# Patient Record
Sex: Female | Born: 1960 | Race: White | Hispanic: No
Health system: Southern US, Academic
[De-identification: ages and names within clinical notes are randomized; demographics above are authoritative.]

## PROBLEM LIST (undated history)

## (undated) ENCOUNTER — Emergency Department (HOSPITAL_COMMUNITY): Discharge status: Absent without leave

## (undated) ENCOUNTER — Telehealth

## (undated) ENCOUNTER — Ambulatory Visit: Payer: PRIVATE HEALTH INSURANCE

## (undated) ENCOUNTER — Encounter

## (undated) ENCOUNTER — Ambulatory Visit

## (undated) ENCOUNTER — Encounter
Attending: Pharmacist Clinician (PhC)/ Clinical Pharmacy Specialist | Primary: Pharmacist Clinician (PhC)/ Clinical Pharmacy Specialist

## (undated) ENCOUNTER — Telehealth
Attending: Pharmacist Clinician (PhC)/ Clinical Pharmacy Specialist | Primary: Pharmacist Clinician (PhC)/ Clinical Pharmacy Specialist

## (undated) ENCOUNTER — Other Ambulatory Visit

## (undated) ENCOUNTER — Ambulatory Visit
Payer: Medicaid (Managed Care) | Attending: Student in an Organized Health Care Education/Training Program | Primary: Student in an Organized Health Care Education/Training Program

## (undated) ENCOUNTER — Ambulatory Visit: Payer: Medicaid (Managed Care)

## (undated) ENCOUNTER — Telehealth
Attending: Student in an Organized Health Care Education/Training Program | Primary: Student in an Organized Health Care Education/Training Program

## (undated) ENCOUNTER — Ambulatory Visit: Payer: PRIVATE HEALTH INSURANCE | Attending: Hematology & Oncology | Primary: Hematology & Oncology

## (undated) ENCOUNTER — Ambulatory Visit
Attending: Student in an Organized Health Care Education/Training Program | Primary: Student in an Organized Health Care Education/Training Program

## (undated) DIAGNOSIS — F419 Anxiety disorder, unspecified: Secondary | ICD-10-CM

## (undated) DIAGNOSIS — B1710 Acute hepatitis C without hepatic coma: Secondary | ICD-10-CM

## (undated) DIAGNOSIS — M722 Plantar fascial fibromatosis: Secondary | ICD-10-CM

## (undated) DIAGNOSIS — M1990 Unspecified osteoarthritis, unspecified site: Secondary | ICD-10-CM

## (undated) DIAGNOSIS — M51369 Other intervertebral disc degeneration, lumbar region without mention of lumbar back pain or lower extremity pain: Secondary | ICD-10-CM

## (undated) DIAGNOSIS — M5430 Sciatica, unspecified side: Secondary | ICD-10-CM

## (undated) DIAGNOSIS — F32A Depression, unspecified: Secondary | ICD-10-CM

## (undated) DIAGNOSIS — J449 Chronic obstructive pulmonary disease, unspecified: Secondary | ICD-10-CM

## (undated) DIAGNOSIS — J309 Allergic rhinitis, unspecified: Secondary | ICD-10-CM

## (undated) DIAGNOSIS — G2581 Restless legs syndrome: Secondary | ICD-10-CM

## (undated) DIAGNOSIS — I10 Essential (primary) hypertension: Secondary | ICD-10-CM

## (undated) DIAGNOSIS — F329 Major depressive disorder, single episode, unspecified: Secondary | ICD-10-CM

## (undated) DIAGNOSIS — M5136 Other intervertebral disc degeneration, lumbar region: Secondary | ICD-10-CM

## (undated) DIAGNOSIS — M5126 Other intervertebral disc displacement, lumbar region: Secondary | ICD-10-CM

## (undated) HISTORY — DX: Major depressive disorder, single episode, unspecified: F32.9

## (undated) HISTORY — PX: BLADDER REPAIR: SHX76

## (undated) HISTORY — DX: Other intervertebral disc degeneration, lumbar region without mention of lumbar back pain or lower extremity pain: M51.369

## (undated) HISTORY — DX: Other intervertebral disc degeneration, lumbar region: M51.36

## (undated) HISTORY — PX: TOTAL VAGINAL HYSTERECTOMY: SHX2548

## (undated) HISTORY — DX: Acute hepatitis C without hepatic coma: B17.10

## (undated) HISTORY — DX: Other intervertebral disc displacement, lumbar region: M51.26

## (undated) HISTORY — DX: Allergic rhinitis, unspecified: J30.9

## (undated) HISTORY — PX: VAGINAL HYSTERECTOMY: SUR661

## (undated) HISTORY — DX: Depression, unspecified: F32.A

## (undated) HISTORY — PX: TUBAL LIGATION: SHX77

## (undated) HISTORY — DX: Plantar fascial fibromatosis: M72.2

## (undated) HISTORY — DX: Essential (primary) hypertension: I10

## (undated) HISTORY — PX: EYE SURGERY: SHX253

---

## 2004-05-27 ENCOUNTER — Emergency Department: Payer: Self-pay | Admitting: Emergency Medicine

## 2006-02-02 ENCOUNTER — Emergency Department: Payer: Self-pay | Admitting: Emergency Medicine

## 2006-12-20 ENCOUNTER — Ambulatory Visit: Payer: Self-pay

## 2008-08-26 ENCOUNTER — Emergency Department: Payer: Self-pay | Admitting: Emergency Medicine

## 2009-01-05 ENCOUNTER — Emergency Department (HOSPITAL_COMMUNITY): Admission: EM | Admit: 2009-01-05 | Discharge: 2009-01-06 | Payer: Self-pay | Admitting: Emergency Medicine

## 2009-10-09 ENCOUNTER — Ambulatory Visit: Payer: Self-pay | Admitting: Family Medicine

## 2010-10-03 LAB — CBC
HCT: 45.6 % (ref 36.0–46.0)
Hemoglobin: 15.7 g/dL — ABNORMAL HIGH (ref 12.0–15.0)
MCHC: 34.4 g/dL (ref 30.0–36.0)
MCV: 102.3 fL — ABNORMAL HIGH (ref 78.0–100.0)
RBC: 4.46 MIL/uL (ref 3.87–5.11)
RDW: 13.3 % (ref 11.5–15.5)

## 2010-10-03 LAB — DIFFERENTIAL
Basophils Absolute: 0.1 10*3/uL (ref 0.0–0.1)
Basophils Relative: 1 % (ref 0–1)
Eosinophils Absolute: 0 10*3/uL (ref 0.0–0.7)
Eosinophils Relative: 0 % (ref 0–5)
Monocytes Absolute: 0.3 10*3/uL (ref 0.1–1.0)
Monocytes Relative: 5 % (ref 3–12)
Neutro Abs: 4 10*3/uL (ref 1.7–7.7)

## 2010-10-03 LAB — RAPID URINE DRUG SCREEN, HOSP PERFORMED
Barbiturates: NOT DETECTED
Cocaine: NOT DETECTED
Opiates: NOT DETECTED
Tetrahydrocannabinol: NOT DETECTED

## 2010-10-03 LAB — BASIC METABOLIC PANEL
CO2: 28 mEq/L (ref 19–32)
Calcium: 9.3 mg/dL (ref 8.4–10.5)
Chloride: 101 mEq/L (ref 96–112)
GFR calc Af Amer: >60 mL/min (ref >60)
Glucose, Bld: 98 mg/dL (ref 70–99)
Sodium: 137 mEq/L (ref 135–145)

## 2011-10-11 ENCOUNTER — Ambulatory Visit: Payer: Self-pay | Admitting: Family Medicine

## 2012-04-20 ENCOUNTER — Ambulatory Visit: Payer: Self-pay | Admitting: Family Medicine

## 2013-12-20 ENCOUNTER — Ambulatory Visit: Payer: Self-pay | Admitting: Gastroenterology

## 2014-11-25 ENCOUNTER — Other Ambulatory Visit: Payer: Self-pay | Admitting: Family Medicine

## 2014-11-25 DIAGNOSIS — Z1231 Encounter for screening mammogram for malignant neoplasm of breast: Secondary | ICD-10-CM

## 2014-12-02 ENCOUNTER — Ambulatory Visit
Admission: RE | Admit: 2014-12-02 | Discharge: 2014-12-02 | Disposition: A | Discharge status: Home / Self Care | Payer: PRIVATE HEALTH INSURANCE | Source: Ambulatory Visit | Attending: Family Medicine | Admitting: Family Medicine

## 2014-12-02 DIAGNOSIS — Z1231 Encounter for screening mammogram for malignant neoplasm of breast: Secondary | ICD-10-CM | POA: Diagnosis present

## 2014-12-03 ENCOUNTER — Other Ambulatory Visit: Payer: Self-pay | Admitting: Family Medicine

## 2014-12-03 DIAGNOSIS — N6489 Other specified disorders of breast: Secondary | ICD-10-CM

## 2014-12-03 DIAGNOSIS — R928 Other abnormal and inconclusive findings on diagnostic imaging of breast: Secondary | ICD-10-CM

## 2014-12-03 NOTE — Telephone Encounter (Signed)
This encounter was created in error - please disregard.

## 2014-12-04 NOTE — Progress Notes (Signed)
Signed off to be done.

## 2014-12-15 ENCOUNTER — Ambulatory Visit
Admission: RE | Admit: 2014-12-15 | Discharge: 2014-12-15 | Disposition: A | Discharge status: Home / Self Care | Payer: PRIVATE HEALTH INSURANCE | Source: Ambulatory Visit | Attending: Family Medicine | Admitting: Family Medicine

## 2014-12-15 ENCOUNTER — Ambulatory Visit: Payer: PRIVATE HEALTH INSURANCE

## 2014-12-15 DIAGNOSIS — N6489 Other specified disorders of breast: Secondary | ICD-10-CM | POA: Insufficient documentation

## 2014-12-15 DIAGNOSIS — R928 Other abnormal and inconclusive findings on diagnostic imaging of breast: Secondary | ICD-10-CM | POA: Diagnosis not present

## 2014-12-22 ENCOUNTER — Other Ambulatory Visit: Payer: PRIVATE HEALTH INSURANCE

## 2014-12-22 ENCOUNTER — Other Ambulatory Visit: Payer: Self-pay

## 2014-12-22 DIAGNOSIS — R946 Abnormal results of thyroid function studies: Secondary | ICD-10-CM

## 2014-12-23 ENCOUNTER — Telehealth: Payer: Self-pay | Admitting: Family Medicine

## 2014-12-23 LAB — TSH: TSH: 4.79 u[IU]/mL — AB (ref 0.450–4.500)

## 2014-12-23 NOTE — Telephone Encounter (Signed)
Phone call Discuss TSH next to nl and pt no thyroid sx  Will obs  No tx

## 2014-12-23 NOTE — Telephone Encounter (Signed)
-----   Message from Wynn Maudlin, [place] sent at 12/23/2014  5:27 PM EDT ----- Line [phone]

## 2014-12-25 DIAGNOSIS — I10 Essential (primary) hypertension: Secondary | ICD-10-CM

## 2014-12-25 DIAGNOSIS — F339 Major depressive disorder, recurrent, unspecified: Secondary | ICD-10-CM

## 2014-12-25 DIAGNOSIS — J42 Unspecified chronic bronchitis: Secondary | ICD-10-CM

## 2014-12-25 DIAGNOSIS — F319 Bipolar disorder, unspecified: Secondary | ICD-10-CM

## 2014-12-25 DIAGNOSIS — F329 Major depressive disorder, single episode, unspecified: Secondary | ICD-10-CM | POA: Insufficient documentation

## 2014-12-25 DIAGNOSIS — B1710 Acute hepatitis C without hepatic coma: Secondary | ICD-10-CM | POA: Insufficient documentation

## 2014-12-25 DIAGNOSIS — J209 Acute bronchitis, unspecified: Secondary | ICD-10-CM

## 2014-12-25 DIAGNOSIS — F32A Depression, unspecified: Secondary | ICD-10-CM | POA: Insufficient documentation

## 2014-12-26 ENCOUNTER — Encounter: Payer: Self-pay | Admitting: Unknown Physician Specialty

## 2014-12-26 ENCOUNTER — Ambulatory Visit (INDEPENDENT_AMBULATORY_CARE_PROVIDER_SITE_OTHER): Payer: PRIVATE HEALTH INSURANCE | Admitting: Unknown Physician Specialty

## 2014-12-26 VITALS — BP 149/81 | HR 82 | Temp 97.8°F | Ht 63.7 in | Wt 129.6 lb

## 2014-12-26 DIAGNOSIS — R0602 Shortness of breath: Secondary | ICD-10-CM

## 2014-12-26 DIAGNOSIS — J449 Chronic obstructive pulmonary disease, unspecified: Secondary | ICD-10-CM

## 2014-12-26 DIAGNOSIS — F17200 Nicotine dependence, unspecified, uncomplicated: Secondary | ICD-10-CM

## 2014-12-26 MED ORDER — BUDESONIDE-FORMOTEROL FUMARATE 160-4.5 MCG/ACT IN AERO: 2.0000 | INHALATION_SPRAY | Freq: Two times a day (BID) | RESPIRATORY_TRACT | Status: DC

## 2014-12-26 NOTE — Progress Notes (Signed)
BP 149/81 mmHg  Pulse 82  Temp(Src) 97.8 F (36.6 C)  Ht 5' 3.7" (1.618 m)  Wt 129 lb 9.6 oz (58.786 kg)  BMI 22.46 kg/m2  SpO2 97%  LMP  (LMP Unknown)   Subjective:    Patient ID: Monique Tucker, female    DOB: [birth year], 54 y.o.   MRN: [id number]  HPI: Monique Tucker is a 54 y.o. female  Chief Complaint  Patient presents with  . Breathing Problem   Breathing Problem She complains of chest tightness, cough, difficulty breathing, shortness of breath and wheezing. Primary symptoms comments: Symptoms "ever since I had that bronchitis.". This is a recurrent problem. The current episode started more than 1 month ago. The problem occurs constantly. The problem has been unchanged. The cough is productive of sputum and hacking. Pertinent negatives include no appetite change, chest pain, dyspnea on exertion, ear pain, heartburn, myalgias, nasal congestion, postnasal drip, rhinorrhea, sneezing, sweats or weight loss. Her symptoms are aggravated by lying down. Her symptoms are alleviated by nothing. She reports no improvement on treatment.     Relevant past medical, surgical, family and social history reviewed and updated as indicated. Interim medical history since our last visit reviewed. Allergies and medications reviewed and updated.  Review of Systems  Constitutional: Negative for weight loss and appetite change.  HENT: Negative for ear pain, postnasal drip, rhinorrhea and sneezing.   Respiratory: Positive for cough, shortness of breath and wheezing.   Cardiovascular: Negative for chest pain and dyspnea on exertion.  Gastrointestinal: Negative for heartburn.  Musculoskeletal: Negative for myalgias.    Per HPI unless specifically indicated above     Objective:    BP 149/81 mmHg  Pulse 82  Temp(Src) 97.8 F (36.6 C)  Ht 5' 3.7" (1.618 m)  Wt 129 lb 9.6 oz (58.786 kg)  BMI 22.46 kg/m2  SpO2 97%  LMP  (LMP Unknown)  Wt Readings from Last 3 Encounters:  12/26/14 129 lb  9.6 oz (58.786 kg)  11/17/14 131 lb (59.421 kg)    Physical Exam  Constitutional: She is oriented to person, place, and time. She appears well-developed and well-nourished. No distress.  HENT:  Head: Normocephalic and atraumatic.  Eyes: Conjunctivae and lids are normal. Right eye exhibits no discharge. Left eye exhibits no discharge. No scleral icterus.  Cardiovascular: Normal rate and regular rhythm.   Pulmonary/Chest: Effort normal and breath sounds normal. No respiratory distress.  Decreased breath sounds throughout  Abdominal: Soft. Normal appearance. There is no splenomegaly or hepatomegaly.  Musculoskeletal: Normal range of motion.  Neurological: She is alert and oriented to person, place, and time.  Skin: Skin is warm, dry and intact. No rash noted. No pallor.  Psychiatric: She has a normal mood and affect. Her behavior is normal. Judgment and thought content normal.    Results for orders placed or performed in visit on 12/22/14  TSH  Result Value Ref Range   TSH 4.790 (H) 0.450 - 4.500 uIU/mL      Assessment & Plan:   Problem List Items Addressed This Visit      Respiratory   Moderate COPD (chronic obstructive pulmonary disease)    Order chest x-ray.  Order Symbicort.        Relevant Medications   budesonide-formoterol (SYMBICORT) 160-4.5 MCG/ACT inhaler   Other Relevant Orders   DG Chest 2 View     Other   Tobacco dependence   Relevant Orders   Spirometry with Graph (Completed)    Other  Visit Diagnoses    SOB (shortness of breath)    -  Primary    Relevant Orders    Spirometry with Graph (Completed)    DG Chest 2 View       Pt ed on COPD and new diagnosis.  Spirometry reviewed.    Follow up plan: Return in about 3 months (around 03/28/2015) for regular f/u.

## 2014-12-26 NOTE — Patient Instructions (Signed)

## 2014-12-26 NOTE — Assessment & Plan Note (Signed)
Order chest x-ray.  Order Symbicort.

## 2015-01-05 ENCOUNTER — Telehealth: Payer: Self-pay

## 2015-01-05 MED ORDER — FLUTICASONE PROPIONATE 50 MCG/ACT NA SUSP: 2.0000 | Freq: Every day | NASAL | Status: DC

## 2015-01-05 NOTE — Telephone Encounter (Signed)
Pharmacy requesting Fluticasone propionate

## 2015-01-27 ENCOUNTER — Telehealth: Payer: Self-pay | Admitting: Family Medicine

## 2015-01-27 NOTE — Telephone Encounter (Signed)
I would need to see pt

## 2015-01-27 NOTE — Telephone Encounter (Signed)
E-fax came through for refill on: Rx: Tramadol Rx: Cyclobenzapr Copy in basket.

## 2015-01-27 NOTE — Telephone Encounter (Signed)
[facility] denied refills

## 2015-01-27 NOTE — Telephone Encounter (Signed)
[facility] requesting refills for  Tramadol (not written since 2015)  And  Cyclobenzaprine 10mg  1Tab TID

## 2015-01-30 ENCOUNTER — Telehealth: Payer: Self-pay | Admitting: Family Medicine

## 2015-01-30 NOTE — Telephone Encounter (Signed)
Pt would not leave message as what this is regarding to, just wanted Monique Tucker to call.

## 2015-02-02 ENCOUNTER — Telehealth: Payer: Self-pay

## 2015-02-02 NOTE — Telephone Encounter (Signed)
Pt called stated she called Friday and still has not received a call back. Please call her ASAP. Thanks.

## 2015-02-03 ENCOUNTER — Telehealth: Payer: Self-pay

## 2015-02-03 MED ORDER — TRAMADOL HCL 50 MG PO TABS: 50.0000 mg | ORAL_TABLET | Freq: Three times a day (TID) | ORAL | Status: DC | PRN

## 2015-02-03 MED ORDER — CYCLOBENZAPRINE HCL 10 MG PO TABS: 10.0000 mg | ORAL_TABLET | Freq: Three times a day (TID) | ORAL | Status: DC | PRN

## 2015-02-03 NOTE — Telephone Encounter (Signed)
Duplicate request

## 2015-02-03 NOTE — Telephone Encounter (Signed)
Rx at pharmacy,patient notified

## 2015-02-03 NOTE — Telephone Encounter (Signed)
Pt called stated she missed your call, please call her back ASAP. Thanks.

## 2015-02-03 NOTE — Telephone Encounter (Signed)
Patient is requesting Tramadol 50mg   And Cyclobenzaprine 10mg   She states that she doesn't use these often but her back is hurting  [employer]

## 2015-02-13 ENCOUNTER — Ambulatory Visit (INDEPENDENT_AMBULATORY_CARE_PROVIDER_SITE_OTHER): Payer: PRIVATE HEALTH INSURANCE | Admitting: Unknown Physician Specialty

## 2015-02-13 ENCOUNTER — Encounter: Payer: Self-pay | Admitting: Unknown Physician Specialty

## 2015-02-13 VITALS — BP 147/73 | HR 62 | Temp 97.9°F | Ht 64.7 in | Wt 131.0 lb

## 2015-02-13 DIAGNOSIS — J42 Unspecified chronic bronchitis: Secondary | ICD-10-CM | POA: Diagnosis not present

## 2015-02-13 DIAGNOSIS — J0100 Acute maxillary sinusitis, unspecified: Secondary | ICD-10-CM

## 2015-02-13 DIAGNOSIS — J209 Acute bronchitis, unspecified: Secondary | ICD-10-CM

## 2015-02-13 DIAGNOSIS — J40 Bronchitis, not specified as acute or chronic: Secondary | ICD-10-CM | POA: Diagnosis not present

## 2015-02-13 MED ORDER — AZITHROMYCIN 250 MG PO TABS: ORAL_TABLET | ORAL | Status: DC

## 2015-02-13 NOTE — Progress Notes (Signed)
BP 147/73 mmHg  Pulse 62  Temp(Src) 97.9 F (36.6 C)  Ht 5' 4.7" (1.643 m)  Wt 131 lb (59.421 kg)  BMI 22.01 kg/m2  SpO2 97%  LMP  (LMP Unknown)   Subjective:    Patient ID: Monique Tucker, female    DOB: 10/03/1960, 54 y.o.   MRN: [id number]  HPI: Monique Tucker is a 54 y.o. female  Chief Complaint  Patient presents with  . Sinusitis    pt states she has bad headache, drainage, runny nose, ear pain on and off, and green mucus when blowing nose   Sinusitis This is a new problem. The current episode started 1 to 4 weeks ago (2 weeks). The problem is unchanged. There has been no fever. The pain is moderate. Associated symptoms include congestion, coughing, headaches, sinus pressure and a sore throat. Treatments tried: Netti pot, flonase, antihistamines. The treatment provided no relief.     Relevant past medical, surgical, family and social history reviewed and updated as indicated. Interim medical history since our last visit reviewed. Allergies and medications reviewed and updated.  Review of Systems  HENT: Positive for congestion, sinus pressure and sore throat.   Respiratory: Positive for cough.   Neurological: Positive for headaches.    Per HPI unless specifically indicated above     Objective:    BP 147/73 mmHg  Pulse 62  Temp(Src) 97.9 F (36.6 C)  Ht 5' 4.7" (1.643 m)  Wt 131 lb (59.421 kg)  BMI 22.01 kg/m2  SpO2 97%  LMP  (LMP Unknown)  Wt Readings from Last 3 Encounters:  02/13/15 131 lb (59.421 kg)  12/26/14 129 lb 9.6 oz (58.786 kg)  11/17/14 131 lb (59.421 kg)    Physical Exam  Constitutional: She is oriented to person, place, and time. She appears well-developed and well-nourished. No distress.  HENT:  Head: Normocephalic and atraumatic.  Right Ear: Tympanic membrane and ear canal normal.  Left Ear: Tympanic membrane and ear canal normal.  Nose: No rhinorrhea. Right sinus exhibits maxillary sinus tenderness. Right sinus exhibits no frontal  sinus tenderness. Left sinus exhibits maxillary sinus tenderness. Left sinus exhibits no frontal sinus tenderness.  Eyes: Conjunctivae and lids are normal. Right eye exhibits no discharge. Left eye exhibits no discharge. No scleral icterus.  Cardiovascular: Normal rate and regular rhythm.   Pulmonary/Chest: Effort normal. No respiratory distress. She has rales.  Abdominal: Normal appearance. There is no splenomegaly or hepatomegaly.  Musculoskeletal: Normal range of motion.  Neurological: She is alert and oriented to person, place, and time.  Skin: Skin is intact. No rash noted. No pallor.  Psychiatric: She has a normal mood and affect. Her behavior is normal. Judgment and thought content normal.    Results for orders placed or performed in visit on 12/22/14  TSH  Result Value Ref Range   TSH 4.790 (H) 0.450 - 4.500 uIU/mL      Assessment & Plan:   Problem List Items Addressed This Visit      Unprioritized   Acute exacerbation of chronic bronchitis   Relevant Medications   azithromycin (ZITHROMAX Z-PAK) 250 MG tablet    Other Visit Diagnoses    Acute maxillary sinusitis, recurrence not specified    -  Primary    Relevant Medications    azithromycin (ZITHROMAX Z-PAK) 250 MG tablet    Bronchitis            Follow up plan: Return if symptoms worsen or fail to improve.

## 2015-03-31 ENCOUNTER — Encounter: Payer: Self-pay | Admitting: Family Medicine

## 2015-03-31 ENCOUNTER — Ambulatory Visit (INDEPENDENT_AMBULATORY_CARE_PROVIDER_SITE_OTHER): Payer: PRIVATE HEALTH INSURANCE | Admitting: Family Medicine

## 2015-03-31 VITALS — BP 154/73 | HR 76 | Temp 97.8°F | Ht 63.7 in | Wt 131.0 lb

## 2015-03-31 DIAGNOSIS — J449 Chronic obstructive pulmonary disease, unspecified: Secondary | ICD-10-CM

## 2015-03-31 DIAGNOSIS — I10 Essential (primary) hypertension: Secondary | ICD-10-CM

## 2015-03-31 DIAGNOSIS — F319 Bipolar disorder, unspecified: Secondary | ICD-10-CM

## 2015-03-31 DIAGNOSIS — R252 Cramp and spasm: Secondary | ICD-10-CM | POA: Diagnosis not present

## 2015-03-31 MED ORDER — TRAMADOL HCL 50 MG PO TABS: 50.0000 mg | ORAL_TABLET | Freq: Three times a day (TID) | ORAL | Status: DC | PRN

## 2015-03-31 MED ORDER — BUDESONIDE-FORMOTEROL FUMARATE 160-4.5 MCG/ACT IN AERO: 2.0000 | INHALATION_SPRAY | Freq: Two times a day (BID) | RESPIRATORY_TRACT | Status: DC

## 2015-03-31 MED ORDER — QUETIAPINE FUMARATE ER 150 MG PO TB24: 150.0000 mg | ORAL_TABLET | Freq: Every day | ORAL | Status: DC

## 2015-03-31 MED ORDER — NAPROXEN 500 MG PO TABS: 500.0000 mg | ORAL_TABLET | Freq: Two times a day (BID) | ORAL | Status: DC

## 2015-03-31 MED ORDER — METOPROLOL SUCCINATE ER 50 MG PO TB24: 50.0000 mg | ORAL_TABLET | Freq: Every day | ORAL | Status: DC

## 2015-03-31 NOTE — Assessment & Plan Note (Signed)
Discuss leg cramps besides stretching, using support hose. Limited use of tramadol and Naprosyn.

## 2015-03-31 NOTE — Assessment & Plan Note (Signed)
With poor control of hypertension Will increase metoprolol from 25 to 50 observe blood pressure response recheck 1 month

## 2015-03-31 NOTE — Assessment & Plan Note (Signed)
The current medical regimen is effective;  continue present plan and medications.  

## 2015-03-31 NOTE — Progress Notes (Signed)
BP 154/73 mmHg  Pulse 76  Temp(Src) 97.8 F (36.6 C)  Ht 5' 3.7" (1.618 m)  Wt 131 lb (59.421 kg)  BMI 22.70 kg/m2  SpO2 98%  LMP  (LMP Unknown)   Subjective:    Patient ID: Monique Tucker, female    DOB: 07-25-1960, 54 y.o.   MRN: [id number]  HPI: Monique Tucker is a 54 y.o. female  Chief Complaint  Patient presents with  . Hypertension  . Leg Pain    Tramadol helps, would like Rx  . 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 Occasionally has swelling Over this last week patient has had marked cramps especially with stretching will get in her calves and sole of her feet. Especially her arch area. Has tried massage was helps some tramadol helped  Blood pressures been elevated has had a lot of stress this week  Patient's Seroquel was doing well nerves especially in this very stressful week and done okay His been able to work full-time.  Relevant past medical, surgical, family and social history reviewed and updated as indicated. Interim medical history since our last visit reviewed. Allergies and medications reviewed and updated.  Review of Systems  Constitutional: Negative.   Respiratory: Negative.   Cardiovascular: Negative.     Per HPI unless specifically indicated above     Objective:    BP 154/73 mmHg  Pulse 76  Temp(Src) 97.8 F (36.6 C)  Ht 5' 3.7" (1.618 m)  Wt 131 lb (59.421 kg)  BMI 22.70 kg/m2  SpO2 98%  LMP  (LMP Unknown)  Wt Readings from Last 3 Encounters:  03/31/15 131 lb (59.421 kg)  02/13/15 131 lb (59.421 kg)  12/26/14 129 lb 9.6 oz (58.786 kg)    Physical Exam  Constitutional: She is oriented to person, place, and time. She appears well-developed and well-nourished. No distress.  HENT:  Head: Normocephalic and atraumatic.  Right Ear: Hearing normal.  Left Ear: Hearing normal.  Nose: Nose normal.  Eyes: Conjunctivae and lids are normal. Right eye  exhibits no discharge. Left eye exhibits no discharge. No scleral icterus.  Cardiovascular: Normal rate, regular rhythm and normal heart sounds.   Pulmonary/Chest: Effort normal and breath sounds normal. No respiratory distress.  Musculoskeletal: Normal range of motion.  Legs with normal pulses no edema no obvious deficit.  Neurological: She is alert and oriented to person, place, and time.  Skin: Skin is intact. No rash noted.  Psychiatric: She has a normal mood and affect. Her speech is normal and behavior is normal. Judgment and thought content normal. Cognition and memory are normal.    Results for orders placed or performed in visit on 12/22/14  TSH  Result Value Ref Range   TSH 4.790 (H) 0.450 - 4.500 uIU/mL      Assessment & Plan:   Problem List Items Addressed This Visit      Cardiovascular and Mediastinum   Hypertension - Primary    With poor control of hypertension Will increase metoprolol from 25 to 50 observe blood pressure response recheck 1 month      Relevant Medications   metoprolol succinate (TOPROL-XL) 50 MG 24 hr tablet     Respiratory   Moderate COPD (chronic obstructive pulmonary disease) (HCC)   Relevant Medications   budesonide-formoterol (SYMBICORT) 160-4.5 MCG/ACT inhaler     Other   Bipolar 1 disorder (HCC)  The current medical regimen is effective;  continue present plan and medications.       Leg cramps    Discuss leg cramps besides stretching, using support hose. Limited use of tramadol and Naprosyn.          Follow up plan: Return in about 4 weeks (around 04/28/2015), or if symptoms worsen or fail to improve, for Blood pressure recheck.

## 2015-04-28 ENCOUNTER — Ambulatory Visit (INDEPENDENT_AMBULATORY_CARE_PROVIDER_SITE_OTHER): Payer: PRIVATE HEALTH INSURANCE | Admitting: Family Medicine

## 2015-04-28 ENCOUNTER — Encounter: Payer: Self-pay | Admitting: Family Medicine

## 2015-04-28 VITALS — BP 130/69 | HR 60 | Temp 98.0°F | Ht 64.5 in | Wt 135.0 lb

## 2015-04-28 DIAGNOSIS — R350 Frequency of micturition: Secondary | ICD-10-CM

## 2015-04-28 DIAGNOSIS — N390 Urinary tract infection, site not specified: Secondary | ICD-10-CM | POA: Diagnosis not present

## 2015-04-28 DIAGNOSIS — I10 Essential (primary) hypertension: Secondary | ICD-10-CM | POA: Diagnosis not present

## 2015-04-28 LAB — MICROSCOPIC EXAMINATION

## 2015-04-28 LAB — URINALYSIS, ROUTINE W REFLEX MICROSCOPIC
Bilirubin, UA: NEGATIVE
Glucose, UA: NEGATIVE
Nitrite, UA: NEGATIVE
PH UA: 6 (ref 5.0–7.5)
Protein, UA: NEGATIVE
RBC UA: NEGATIVE
Specific Gravity, UA: 1.025 (ref 1.005–1.030)
Urobilinogen, Ur: 1 mg/dL (ref 0.2–1.0)

## 2015-04-28 MED ORDER — CIPROFLOXACIN HCL 250 MG PO TABS: 250.0000 mg | ORAL_TABLET | Freq: Two times a day (BID) | ORAL | Status: DC

## 2015-04-28 NOTE — Assessment & Plan Note (Signed)
The current medical regimen is effective;  continue present plan and medications.  

## 2015-04-28 NOTE — Progress Notes (Signed)
   BP 130/69 mmHg  Pulse 60  Temp(Src) 98 F (36.7 C)  Ht 5' 4.5" (1.638 m)  Wt 135 lb (61.236 kg)  BMI 22.82 kg/m2  SpO2 97%  LMP  (LMP Unknown)   Subjective:    Patient ID: Monique Tucker, female    DOB: 09-Jan-1961, 54 y.o.   MRN: [id number]  HPI: Monique Tucker is a 54 y.o. female  Chief Complaint  Patient presents with  . Hypertension  . Urinary Tract Infection   Blood pressure doing well on increase metoprolol with no side effects and good control of blood pressure Yesterday started frequency urgency dysuria hasn't tried any medicines has increase fluids. But now just has increased frequency as a consequent Relevant past medical, surgical, family and social history reviewed and updated as indicated. Interim medical history since our last visit reviewed. Allergies and medications reviewed and updated.  Review of Systems  Constitutional: Negative.   Respiratory: Negative.   Cardiovascular: Negative.     Per HPI unless specifically indicated above     Objective:    BP 130/69 mmHg  Pulse 60  Temp(Src) 98 F (36.7 C)  Ht 5' 4.5" (1.638 m)  Wt 135 lb (61.236 kg)  BMI 22.82 kg/m2  SpO2 97%  LMP  (LMP Unknown)  Wt Readings from Last 3 Encounters:  04/28/15 135 lb (61.236 kg)  03/31/15 131 lb (59.421 kg)  02/13/15 131 lb (59.421 kg)    Physical Exam  Constitutional: She is oriented to person, place, and time. She appears well-developed and well-nourished. No distress.  HENT:  Head: Normocephalic and atraumatic.  Right Ear: Hearing normal.  Left Ear: Hearing normal.  Nose: Nose normal.  Eyes: Conjunctivae and lids are normal. Right eye exhibits no discharge. Left eye exhibits no discharge. No scleral icterus.  Cardiovascular: Normal rate, regular rhythm and normal heart sounds.   Pulmonary/Chest: Effort normal and breath sounds normal. No respiratory distress.  Musculoskeletal: Normal range of motion.  Neurological: She is alert and oriented to person,  place, and time.  Skin: Skin is intact. No rash noted.  Psychiatric: She has a normal mood and affect. Her speech is normal and behavior is normal. Judgment and thought content normal. Cognition and memory are normal.    Results for orders placed or performed in visit on 12/22/14  TSH  Result Value Ref Range   TSH 4.790 (H) 0.450 - 4.500 uIU/mL      Assessment & Plan:   Problem List Items Addressed This Visit      Cardiovascular and Mediastinum   Hypertension    The current medical regimen is effective;  continue present plan and medications.         Genitourinary   Urinary tract infection    Will start Cipro discussed fluids hydration       Other Visit Diagnoses    Urinary frequency    -  Primary    Relevant Orders    Urinalysis, Routine w reflex microscopic (not at [facility])        Follow up plan: Return in about 6 months (around 10/26/2015) for Physical Exam.

## 2015-04-28 NOTE — Assessment & Plan Note (Signed)
Will start Cipro discussed fluids hydration

## 2015-05-28 ENCOUNTER — Telehealth: Payer: Self-pay

## 2015-05-28 NOTE — Telephone Encounter (Signed)
Patient requesting call back, no information left

## 2015-05-28 NOTE — Telephone Encounter (Signed)
Patient's bladder is protruding, she is going to schedule appointment with [facility]

## 2015-06-04 ENCOUNTER — Ambulatory Visit (INDEPENDENT_AMBULATORY_CARE_PROVIDER_SITE_OTHER): Payer: PRIVATE HEALTH INSURANCE | Admitting: Family Medicine

## 2015-06-04 ENCOUNTER — Encounter: Payer: Self-pay | Admitting: Family Medicine

## 2015-06-04 VITALS — BP 151/78 | HR 67 | Temp 98.4°F | Ht 64.5 in | Wt 135.0 lb

## 2015-06-04 DIAGNOSIS — N8110 Cystocele, unspecified: Secondary | ICD-10-CM | POA: Insufficient documentation

## 2015-06-04 DIAGNOSIS — I10 Essential (primary) hypertension: Secondary | ICD-10-CM | POA: Diagnosis not present

## 2015-06-04 DIAGNOSIS — R252 Cramp and spasm: Secondary | ICD-10-CM

## 2015-06-04 MED ORDER — TRAMADOL HCL 50 MG PO TABS: 50.0000 mg | ORAL_TABLET | Freq: Three times a day (TID) | ORAL | Status: DC | PRN

## 2015-06-04 NOTE — Assessment & Plan Note (Signed)
Takes occasional tramadol which helps great deal needs a refill

## 2015-06-04 NOTE — Progress Notes (Signed)
v  BP 151/78 mmHg  Pulse 67  Temp(Src) 98.4 F (36.9 C)  Ht 5' 4.5" (1.638 m)  Wt 135 lb (61.236 kg)  BMI 22.82 kg/m2  SpO2 96%  LMP  (LMP Unknown)   Subjective:    Patient ID: Monique Tucker, female    DOB: Oct 22, 1960, 54 y.o.   MRN: [id number]  HPI: Monique VENNEMAN is a 54 y.o. female  Chief Complaint  Patient presents with  . Bladder Prolapse   Patient feels like her bladder is coming out has had some UTIs but otherwise doing well no urinary symptoms now No vaginal pain or discomfort No bleeding no discharge Relevant past medical, surgical, family and social history reviewed and updated as indicated. Interim medical history since our last visit reviewed. Allergies and medications reviewed and updated.  Review of Systems  Constitutional: Negative.   Respiratory: Negative.   Cardiovascular: Negative.     Per HPI unless specifically indicated above     Objective:    BP 151/78 mmHg  Pulse 67  Temp(Src) 98.4 F (36.9 C)  Ht 5' 4.5" (1.638 m)  Wt 135 lb (61.236 kg)  BMI 22.82 kg/m2  SpO2 96%  LMP  (LMP Unknown)  Wt Readings from Last 3 Encounters:  06/04/15 135 lb (61.236 kg)  04/28/15 135 lb (61.236 kg)  03/31/15 131 lb (59.421 kg)    Physical Exam  Constitutional: She is oriented to person, place, and time. She appears well-developed and well-nourished. No distress.  HENT:  Head: Normocephalic and atraumatic.  Right Ear: Hearing normal.  Left Ear: Hearing normal.  Nose: Nose normal.  Eyes: Conjunctivae and lids are normal. Right eye exhibits no discharge. Left eye exhibits no discharge. No scleral icterus.  Pulmonary/Chest: Effort normal. No respiratory distress.  Musculoskeletal: Normal range of motion.  Neurological: She is alert and oriented to person, place, and time.  Skin: Skin is intact. No rash noted.  Psychiatric: She has a normal mood and affect. Her speech is normal and behavior is normal. Judgment and thought content normal. Cognition and  memory are normal.    Results for orders placed or performed in visit on 04/28/15  Microscopic Examination  Result Value Ref Range   WBC, UA 6-10 (A) 0 -  5 /hpf   RBC, UA 0-2 0 -  2 /hpf   Epithelial Cells (non renal) 0-10 0 - 10 /hpf   Mucus, UA Present Not Estab.   Bacteria, UA Few None seen/Few  Urinalysis, Routine w reflex microscopic (not at [facility])  Result Value Ref Range   Specific Gravity, UA 1.025 1.005 - 1.030   pH, UA 6.0 5.0 - 7.5   Color, UA Yellow Yellow   Appearance Ur Cloudy (A) Clear   Leukocytes, UA 1+ (A) Negative   Protein, UA Negative Negative/Trace   Glucose, UA Negative Negative   Ketones, UA Trace (A) Negative   RBC, UA Negative Negative   Bilirubin, UA Negative Negative   Urobilinogen, Ur 1.0 0.2 - 1.0 mg/dL   Nitrite, UA Negative Negative   Microscopic Examination See below:       Assessment & Plan:   Problem List Items Addressed This Visit      Cardiovascular and Mediastinum   Hypertension - Primary    Blood pressure elevated today with concern and worry Blood pressure otherwise doing well        Genitourinary   Bladder prolapse, female, acquired    Discussed bladder prolapse Discuss possibilities of urinary tract infections  Patient will drink extra water GYN referral      Relevant Orders   Ambulatory referral to Gynecology     Other   Leg cramps    Takes occasional tramadol which helps great deal needs a refill          Follow up plan: Return for As scheduled.

## 2015-06-04 NOTE — Assessment & Plan Note (Signed)
Discussed bladder prolapse Discuss possibilities of urinary tract infections Patient will drink extra water GYN referral

## 2015-06-04 NOTE — Assessment & Plan Note (Signed)
Blood pressure elevated today with concern and worry Blood pressure otherwise doing well

## 2015-06-25 ENCOUNTER — Other Ambulatory Visit: Payer: PRIVATE HEALTH INSURANCE

## 2015-06-25 ENCOUNTER — Other Ambulatory Visit: Payer: Self-pay | Admitting: Family Medicine

## 2015-06-25 ENCOUNTER — Telehealth: Payer: Self-pay

## 2015-06-25 DIAGNOSIS — N390 Urinary tract infection, site not specified: Secondary | ICD-10-CM

## 2015-06-25 LAB — UA/M W/RFLX CULTURE, ROUTINE
BILIRUBIN UA: NEGATIVE
Glucose, UA: NEGATIVE
Ketones, UA: NEGATIVE
Leukocytes, UA: NEGATIVE
NITRITE UA: NEGATIVE
PH UA: 6 (ref 5.0–7.5)
Protein, UA: NEGATIVE
RBC UA: NEGATIVE
SPEC GRAV UA: 1.015 (ref 1.005–1.030)
UUROB: 0.2 mg/dL (ref 0.2–1.0)

## 2015-06-25 NOTE — Telephone Encounter (Signed)
Will come in for UA. Order placed.

## 2015-06-25 NOTE — Telephone Encounter (Signed)
Patient states she has another UTI (seen in Nov and Dec) and is wondering if something can be called in or does she need to come in?  Patient will come in and leave sample - ok per Dr. Wynetta Emery

## 2015-06-26 NOTE — Telephone Encounter (Signed)
UA was negative. Monique Tucker called patient to let her know that she did not have a UTI, likely due to the prolapse pushing on her bladder. Will call GYN and see if they can get her in sooner.

## 2015-06-30 ENCOUNTER — Encounter: Payer: Self-pay | Admitting: Obstetrics and Gynecology

## 2015-06-30 ENCOUNTER — Ambulatory Visit (INDEPENDENT_AMBULATORY_CARE_PROVIDER_SITE_OTHER): Payer: PRIVATE HEALTH INSURANCE | Admitting: Obstetrics and Gynecology

## 2015-06-30 VITALS — BP 153/87 | HR 84 | Ht 64.0 in | Wt 130.6 lb

## 2015-06-30 DIAGNOSIS — Z780 Asymptomatic menopausal state: Secondary | ICD-10-CM | POA: Insufficient documentation

## 2015-06-30 DIAGNOSIS — N8110 Cystocele, unspecified: Secondary | ICD-10-CM

## 2015-06-30 DIAGNOSIS — N952 Postmenopausal atrophic vaginitis: Secondary | ICD-10-CM | POA: Diagnosis not present

## 2015-06-30 DIAGNOSIS — N390 Urinary tract infection, site not specified: Secondary | ICD-10-CM

## 2015-06-30 DIAGNOSIS — IMO0002 Reserved for concepts with insufficient information to code with codable children: Secondary | ICD-10-CM

## 2015-06-30 LAB — POCT URINALYSIS DIPSTICK
BILIRUBIN UA: NEGATIVE
GLUCOSE UA: NEGATIVE
KETONES UA: NEGATIVE
Leukocytes, UA: NEGATIVE
NITRITE UA: NEGATIVE
PH UA: 6
Protein, UA: NEGATIVE
RBC UA: NEGATIVE
SPEC GRAV UA: 1.01
Urobilinogen, UA: 0.2

## 2015-06-30 MED ORDER — ESTRADIOL 0.1 MG/GM VA CREA: TOPICAL_CREAM | VAGINAL | Status: DC

## 2015-06-30 NOTE — Patient Instructions (Signed)
  1.  Estrace cream 1/2 g intravaginal nightly for 4 weeks.  Prescription is given. 2.  Decrease tobacco use; control, COPD, symptomatology to minimize cystocele symptoms 3.  Return in 4 weeks for pessary trial

## 2015-06-30 NOTE — Progress Notes (Signed)
GYN ENCOUNTER NOTE  Subjective:       Monique Tucker is a 55 y.o. [phone] female is here for gynecologic evaluation of the following issues:  1.  Pelvic pain. 2.  Urinary frequency. 3.  Bladder prolapse.    55 year old single white female, para 2002, menopausal since age 65, status post BTL for contraception, presents for evaluation of pelvic pain and pelvic organ prolapse.  Patient states that she has had long-standing low backache and pelvic pain with recent exacerbation over the past week.  She spends hours standing as a Scientist, water quality.  She does have low backache. Long history of pack-a-day smoking since age 1.  Patient does have COPD.  GU history: Urinary frequency approximately 15 times a day. No history of nocturia. History of stress urinary incontinence for which she does wear pads. Mild urgency symptoms are present. Past history of 3 UTIs per year without history of pyelonephritis.  GI history: Bowel movements are daily. No history of chronic constipation.   Past OB history: Para 2002. SVD 2 with largest baby 5 lbs. 7 oz.  Past GYN history: Menarche age 59. Menopause age 23. No history of HRT use. She does report vasomotor symptoms including hot flashes and night sweats. No history of abnormal Pap smears. No history of abnormal mammograms. No history of STI/PID.   History of dysmenorrhea. Patient does report past history of deep thrusting dyspareunia; not currently sexually active.   Gynecologic History No LMP recorded (lmp unknown). Patient is postmenopausal. Contraception: post menopausal status Last Pap: Normal Last mammogram: Normal  Obstetric History OB History  Gravida Para Term Preterm AB SAB TAB Ectopic Multiple Living  2 2 2       2     # Outcome Date GA Lbr Len/2nd Weight Sex Delivery Anes PTL Lv  2 Term 1981   5 lb 12.8 oz (2.631 kg) F Vag-Spont   Y  1 Term 1979   4 lb 2.4 oz (1.882 kg) F Vag-Spont   Y      Past Medical History  Diagnosis Date   . Bipolar 1 disorder ([place])   . Depression   . Acute hepatitis C   . Hypertension   . AR (allergic rhinitis)   . Bulging lumbar disc     Past Surgical History  Procedure Laterality Date  . Tubal ligation      Current Outpatient Prescriptions on File Prior to Visit  Medication Sig Dispense Refill  . budesonide-formoterol (SYMBICORT) 160-4.5 MCG/ACT inhaler Inhale 2 puffs into the lungs 2 (two) times daily. 3 Inhaler 2  . cetirizine-pseudoephedrine (ZYRTEC-D) 5-120 MG per tablet Take 1 tablet by mouth 2 (two) times daily.    . fluticasone (FLONASE) 50 MCG/ACT nasal spray Place 2 sprays into both nostrils daily. 1 g 12  . metoprolol succinate (TOPROL-XL) 50 MG 24 hr tablet Take 1 tablet (50 mg total) by mouth daily. 90 tablet 2  . naproxen (NAPROSYN) 500 MG tablet Take 1 tablet (500 mg total) by mouth 2 (two) times daily with a meal. 180 tablet 1  . QUEtiapine Fumarate (SEROQUEL XR) 150 MG 24 hr tablet Take 1 tablet (150 mg total) by mouth at bedtime. 90 tablet 2  . traMADol (ULTRAM) 50 MG tablet Take 1 tablet (50 mg total) by mouth every 8 (eight) hours as needed. 30 tablet 1   No current facility-administered medications on file prior to visit.    No Known Allergies  Social History   Social History  . Marital Status:  Single    Spouse Name: N/A  . Number of Children: N/A  . Years of Education: N/A   Occupational History  . Not on file.   Social History Main Topics  . Smoking status: Current Every Day Smoker -- 1.00 packs/day    Types: Cigarettes  . Smokeless tobacco: Never Used  . Alcohol Use: 7.2 oz/week    12 Cans of beer per week     Comment: weekly  . Drug Use: No  . Sexual Activity: No   Other Topics Concern  . Not on file   Social History Narrative    Family History  Problem Relation Age of Onset  . Breast cancer Mother   . Pancreatic cancer Mother   . Cancer Mother     breast and pancreatic  . Hypertension Mother   . Migraines Mother   . Heart  disease Father   . Mental illness Sister     depression  . Alcohol abuse Maternal Grandfather   . Cancer Maternal Grandmother     lung and brain  . Mental illness Sister     depression    The following portions of the patient's history were reviewed and updated as appropriate: allergies, current medications, past family history, past medical history, past social history, past surgical history and problem list.  Review of Systems Review of Systems - General ROS: negative for - chills, fatigue, fever, hot flashes, malaise or night sweats Hematological and Lymphatic ROS: negative for - bleeding problems or swollen lymph nodes Gastrointestinal ROS: negative for - abdominal pain, blood in stools, change in bowel habits and nausea/vomiting Musculoskeletal ROS: negative for - joint pain, muscle pain or muscular weakness Genito-Urinary ROS: negative for - change in menstrual cycle, dysmenorrhea, dyspareunia, dysuria, genital discharge, genital ulcers, hematuria, incontinence, irregular/heavy menses, nocturia or pelvic painjj  Objective:   BP 153/87 mmHg  Pulse 84  Ht 5\' 4"  (1.626 m)  Wt 130 lb 9.6 oz (59.24 kg)  BMI 22.41 kg/m2  LMP  (LMP Unknown) CONSTITUTIONAL: Well-developed, well-nourished female in no acute distress.  HENT:  Normocephalic, atraumatic.  NECK: Normal range of motion, supple, no masses.  Normal thyroid.  SKIN: Skin is warm and dry. No rash noted. Not diaphoretic. No erythema. No pallor. Clubbing is noted.  Fingers NEUROLGIC: Alert and oriented to person, place, and time. PSYCHIATRIC: Normal mood and affect. Normal behavior. Normal judgment and thought content. CARDIOVASCULAR:Not Examined RESPIRATORY: Not Examined BREASTS: Not Examined ABDOMEN: Soft, non distended; Non tender.  No Organomegaly. PELVIC:  External Genitalia: Normal  BUS: Normal  Vagina: Moderately atrophic; Second-degree cystocele With rotational descent of the bladder neck With Valsalva  Cervix: No  lesions; good support  Uterus: Normal size, shape,consistency, mobile  Adnexa: Normal  RV: Normal ; Mild rectocele  Bladder: Nontender MUSCULOSKELETAL: Normal range of motion. No tenderness.  No cyanosis, clubbing, or edema.     Assessment:   1. Frequent UTI - POCT urinalysis dipstick - Urine culture  2. Cystocele, Second degree cystocele with rotational descent of the bladder neck with Valsalva  3. Vaginal atrophy, Moderate  4. Menopause, Symptomatic with vasomotor symptoms and urogenital atrophy     Plan:   1.  Urine culture. 2.  Estrace vaginal cream 1/2 g intravaginal daily for 4 weeks; then, twice weekly. 3.  Return for pessary fitting in 4 weeks. 4.  Medical and surgical options for symptomatic pelvic relaxation have been reviewed including pessary use versus cystocele repair and pubovaginal sling.  Implications of chronic tobacco  use and COPD on condition were reviewed.  Brayton Mars, MD  Note: This dictation was prepared with Dragon dictation along with smaller phrase technology. Any transcriptional errors that result from this process are unintentional.

## 2015-07-01 LAB — URINE CULTURE: Organism ID, Bacteria: NO GROWTH

## 2015-07-06 ENCOUNTER — Telehealth: Payer: Self-pay | Admitting: Obstetrics and Gynecology

## 2015-07-06 NOTE — Telephone Encounter (Signed)
PT WAS HERE LAST WEEK AND A RX WAS SUPPOSE TO BE CALLED IN FOR HER AND SHE CALLED HER PHARMACY AND THEY DO NOT HAVE IT. SHE SAID IT WAS ESTRACE. SHE USES [facility].

## 2015-07-06 NOTE — Telephone Encounter (Signed)
Med phoned in. Pt aware.

## 2015-07-23 ENCOUNTER — Encounter: Payer: PRIVATE HEALTH INSURANCE | Admitting: Obstetrics and Gynecology

## 2015-07-28 ENCOUNTER — Encounter: Payer: Self-pay | Admitting: Obstetrics and Gynecology

## 2015-07-28 ENCOUNTER — Ambulatory Visit (INDEPENDENT_AMBULATORY_CARE_PROVIDER_SITE_OTHER): Payer: PRIVATE HEALTH INSURANCE | Admitting: Obstetrics and Gynecology

## 2015-07-28 VITALS — BP 138/74 | HR 67 | Ht 64.0 in | Wt 132.9 lb

## 2015-07-28 DIAGNOSIS — N8110 Cystocele, unspecified: Secondary | ICD-10-CM | POA: Diagnosis not present

## 2015-07-28 DIAGNOSIS — N393 Stress incontinence (female) (male): Secondary | ICD-10-CM | POA: Diagnosis not present

## 2015-07-28 DIAGNOSIS — N390 Urinary tract infection, site not specified: Secondary | ICD-10-CM

## 2015-07-28 DIAGNOSIS — N952 Postmenopausal atrophic vaginitis: Secondary | ICD-10-CM | POA: Diagnosis not present

## 2015-07-28 DIAGNOSIS — IMO0002 Reserved for concepts with insufficient information to code with codable children: Secondary | ICD-10-CM

## 2015-07-28 NOTE — Patient Instructions (Signed)
1.  Patient will be contacted when pessary arrives for insertion.

## 2015-07-28 NOTE — Progress Notes (Signed)
Chief complaint: 1.  Vaginal atrophy. 2.  Stress urinary incontinence. 3.  Pessary fitting. 4.  Pelvic organ prolapse (second-degree cystocele with rotational descent of bladder neck; uterine prolapse).  Patient presents for follow-up on above issues.  She has been using estrogen vaginal cream nightly for the past month.  She is now here for pessary fitting and follow-up on her atrophy symptoms.  She has not noted any significant change in her symptomatology.  OBJECTIVE: BP 138/74 mmHg  Pulse 67  Ht 5\' 4"  (1.626 m)  Wt 132 lb 14.4 oz (60.283 kg)  BMI 22.80 kg/m2  LMP  (LMP Unknown) ABDOMEN: Soft, non distended; Non tender. No Organomegaly. PELVIC: External Genitalia: Normal BUS: Normal Vagina: Mildly atrophic (Improved); Second-degree cystocele With rotational descent of the bladder neck With Valsalva Cervix: No lesions; good support Uterus: Normal size, shape,consistency, mobile Adnexa: Normal RV: Normal ; Mild rectocele Bladder: Nontender  PROCEDURE: Pessary fitting.  #3 incontinence dish with support-too large   #2 incontinence dish with support-Successful  ASSESSMENT: 1.  Frequent UTI. 2.  Vaginal atrophy, mild, improving with estrogen cream. 3.  Cystocele, second-degree, with rotational descent of bladder neck with Valsalva. 4.  Stress incontinence.  PLAN: 1.  Continue estrogen cream intravaginally twice a week. 2.  Pessary fitting 3.  Patient is to return in 2 weeks for pessary insertion  Brayton Mars, MD  Note: This dictation was prepared with Dragon dictation along with smaller phrase technology. Any transcriptional errors that result from this process are unintentional.

## 2015-08-12 ENCOUNTER — Encounter: Payer: Self-pay | Admitting: Obstetrics and Gynecology

## 2015-08-12 ENCOUNTER — Ambulatory Visit (INDEPENDENT_AMBULATORY_CARE_PROVIDER_SITE_OTHER): Payer: PRIVATE HEALTH INSURANCE | Admitting: Obstetrics and Gynecology

## 2015-08-12 VITALS — BP 137/71 | HR 71 | Ht 64.0 in | Wt 133.0 lb

## 2015-08-12 DIAGNOSIS — IMO0002 Reserved for concepts with insufficient information to code with codable children: Secondary | ICD-10-CM

## 2015-08-12 DIAGNOSIS — N8110 Cystocele, unspecified: Secondary | ICD-10-CM | POA: Diagnosis not present

## 2015-08-12 DIAGNOSIS — N952 Postmenopausal atrophic vaginitis: Secondary | ICD-10-CM | POA: Diagnosis not present

## 2015-08-12 DIAGNOSIS — N393 Stress incontinence (female) (male): Secondary | ICD-10-CM | POA: Insufficient documentation

## 2015-08-12 NOTE — Patient Instructions (Signed)
1.  Return in 2 weeks for pessary check. 2.  Use TRIMO san gel for lubrication with pessary insertion

## 2015-08-12 NOTE — Progress Notes (Signed)
Chief complaint: 1. Second-degree cystocele with rotational descent of the bladder neck. 2.  SUI. 3.  Vaginal atrophy  Patient presents today for pessary insertion.  OBJECTIVE: BP 137/71 mmHg  Pulse 71  Ht 5\' 4"  (1.626 m)  Wt 133 lb (60.328 kg)  BMI 22.82 kg/m2  LMP  (LMP Unknown) PELVIC: External Genitalia: Normal BUS: Normal Vagina: Mildly atrophic (Improved); Second-degree cystocele With rotational descent of the bladder neck With Valsalva Cervix: No lesions; good support Uterus: Normal size, shape,consistency, mobile Adnexa: Normal RV: Normal ; Mild rectocele Bladder: Nontender  PROCEDURE: #2 incontinence dish with support pessary is inserted.  Patient demonstrates proficiency in removing and inserting pessary  ASSESSMENT: 1.  Secondary cystocele with rotational descent of bladder neck. 2.  Vaginal atrophy. 3.  Stress urinary incontinence. 4.  Frequent UTIs.  PLAN: 1.  Incontinence dish with support inserted today. 2.  Return in 2 weeks for follow-up on pessary maintenance  Brayton Mars, MD  Note: This dictation was prepared with Dragon dictation along with smaller phrase technology. Any transcriptional errors that result from this process are unintentional.

## 2015-08-26 ENCOUNTER — Ambulatory Visit (INDEPENDENT_AMBULATORY_CARE_PROVIDER_SITE_OTHER): Payer: PRIVATE HEALTH INSURANCE | Admitting: Obstetrics and Gynecology

## 2015-08-26 ENCOUNTER — Encounter: Payer: Self-pay | Admitting: Obstetrics and Gynecology

## 2015-08-26 VITALS — BP 172/71 | HR 76 | Ht 64.0 in | Wt 132.9 lb

## 2015-08-26 DIAGNOSIS — Z4689 Encounter for fitting and adjustment of other specified devices: Secondary | ICD-10-CM

## 2015-08-26 DIAGNOSIS — N952 Postmenopausal atrophic vaginitis: Secondary | ICD-10-CM

## 2015-08-26 DIAGNOSIS — N393 Stress incontinence (female) (male): Secondary | ICD-10-CM | POA: Diagnosis not present

## 2015-08-26 DIAGNOSIS — N8110 Cystocele, unspecified: Secondary | ICD-10-CM | POA: Diagnosis not present

## 2015-08-26 DIAGNOSIS — IMO0002 Reserved for concepts with insufficient information to code with codable children: Secondary | ICD-10-CM

## 2015-08-26 NOTE — Progress Notes (Signed)
Chief complaint: 1. Pessary maintenance 2. Cystocele 3. Stress urinary incontinence 4. Vaginal atrophy  Patient returns for 2 week pessary check. She has had significant frustration with removal and insertion of the pessary. She would now prefer that we manage the pessary totally. No vaginal bleeding or vaginal discharge. No vaginal odor.  Past medical history: Past surgical history, problem list, medications, and allergies are reviewed.  Objective: BP 172/71 mmHg  Pulse 76  Ht 5\' 4"  (1.626 m)  Wt 132 lb 14.4 oz (60.283 kg)  BMI 22.80 kg/m2  LMP  (LMP Unknown) PELVIC: External Genitalia: Normal BUS: Normal Vagina: Mildly atrophic (Improved); Second-degree cystocele With rotational descent of the bladder neck With Valsalva Cervix: No lesions; good support Uterus: Normal size, shape,consistency, mobile Adnexa: Normal RV: Normal ; Mild rectocele Bladder: Nontender  PROCEDURE: #2 incontinence dish with support pessary is inserted.  ASSESSMENT: 1. Second-degree cystocele with rotational descent of bladder neck. 2. Vaginal atrophy. 3. Stress urinary incontinence. 4. Frequent UTIs. 5. Frustration with self maintenance of pessary  PLAN: 1. Incontinence dish with support inserted today. 2. Return in 4 weeks for follow-up on pessary maintenance 3. Continue with estrogen cream and Trimosan gel administration weekly  Brayton Mars, MD  Note: This dictation was prepared with Dragon dictation along with smaller phrase technology. Any transcriptional errors that result from this process are unintentional.

## 2015-08-26 NOTE — Patient Instructions (Signed)
1. Use the estrogen cream intravaginal once a week and the Trimosan gel once a week 2. Return in 4 weeks for follow-up

## 2015-09-09 ENCOUNTER — Encounter: Payer: Self-pay | Admitting: Obstetrics and Gynecology

## 2015-09-09 ENCOUNTER — Ambulatory Visit (INDEPENDENT_AMBULATORY_CARE_PROVIDER_SITE_OTHER): Payer: PRIVATE HEALTH INSURANCE | Admitting: Obstetrics and Gynecology

## 2015-09-09 VITALS — BP 150/81 | HR 81 | Ht 64.0 in | Wt 129.6 lb

## 2015-09-09 DIAGNOSIS — IMO0002 Reserved for concepts with insufficient information to code with codable children: Secondary | ICD-10-CM

## 2015-09-09 DIAGNOSIS — N952 Postmenopausal atrophic vaginitis: Secondary | ICD-10-CM

## 2015-09-09 DIAGNOSIS — N8110 Cystocele, unspecified: Secondary | ICD-10-CM | POA: Diagnosis not present

## 2015-09-09 DIAGNOSIS — N393 Stress incontinence (female) (male): Secondary | ICD-10-CM | POA: Diagnosis not present

## 2015-09-09 DIAGNOSIS — B373 Candidiasis of vulva and vagina: Secondary | ICD-10-CM | POA: Diagnosis not present

## 2015-09-09 DIAGNOSIS — Z780 Asymptomatic menopausal state: Secondary | ICD-10-CM

## 2015-09-09 DIAGNOSIS — R1031 Right lower quadrant pain: Secondary | ICD-10-CM | POA: Diagnosis not present

## 2015-09-09 DIAGNOSIS — B3731 Acute candidiasis of vulva and vagina: Secondary | ICD-10-CM

## 2015-09-09 MED ORDER — TERCONAZOLE 80 MG VA SUPP: 80.0000 mg | Freq: Every day | VAGINAL | Status: DC

## 2015-09-09 NOTE — Patient Instructions (Signed)
1. tERAZOL SUPPOSITORIES NIGHTLY FOR 3 NIGHTS 2. aFTER tERAZOL THERAPY, PLACE ESTROGEN CREAM INTRAVAGINALLY EVERY NIGHT FOR 4 WEEKS 3. sURGERY FOR PROLAPSE IS TO BE SCHEDULED TODAY FOR HER 6 WEEKS FROM NOW 4. rETURN IN 5 WEEKS FOR A PREOP APPOINTMENT

## 2015-09-10 ENCOUNTER — Ambulatory Visit (INDEPENDENT_AMBULATORY_CARE_PROVIDER_SITE_OTHER): Payer: PRIVATE HEALTH INSURANCE

## 2015-09-10 DIAGNOSIS — R1031 Right lower quadrant pain: Secondary | ICD-10-CM

## 2015-09-21 NOTE — Progress Notes (Signed)
Chief complaint: 1.  Pelvic organ prolapse 2.  Four-week pessary check. 3.  Cystocele. 4.  Stress urinary incontinence. 5.  Vaginal atrophy.  Four-week pessary check.  Patient is still frustrated with removal and insertion of pessary.  Vaginal discharge is noted.  Some vulvar itching  OBJECTIVE: BP 150/81 mmHg  Pulse 81  Ht 5\' 4"  (1.626 m)  Wt 129 lb 9.6 oz (58.786 kg)  BMI 22.23 kg/m2  LMP  (LMP Unknown)  PELVIC: External Genitalia: Normal BUS: Normal Vagina: Mildly atrophic (Improved); Second-degree cystocele With rotational descent of the bladder neck With Valsalva Cervix: No lesions; good support Uterus: Normal size, shape,consistency, mobile Adnexa: Normal RV: Normal ; Mild rectocele Bladder: Nontender   ASSESSMENT: 1. Second-degree cystocele with rotational descent of bladder neck. 2. Vaginal atrophy. 3. Stress urinary incontinence. 4. Frequent UTIs. 5. Frustration with self maintenance of pessary 6.  Monilia vaginitis  PLAN: 1.  Terazol 3. 2.  Begin estrogen cream intravaginally nightly for 4 weeks. 3.  TVH BSO with anterior colporrhaphy is scheduled. 4.  Return in 5 weeks for preop appointment  A total of 15 minutes were spent face-to-face with the patient during this encounter and over half of that time dealt with counseling and coordination of care.   Brayton Mars, MD  Note: This dictation was prepared with Dragon dictation along with smaller phrase technology. Any transcriptional errors that result from this process are unintentional.

## 2015-09-23 ENCOUNTER — Ambulatory Visit: Payer: PRIVATE HEALTH INSURANCE | Admitting: Obstetrics and Gynecology

## 2015-10-14 ENCOUNTER — Encounter
Admission: RE | Admit: 2015-10-14 | Discharge: 2015-10-14 | Disposition: A | Discharge status: Home / Self Care | Payer: PRIVATE HEALTH INSURANCE | Source: Ambulatory Visit | Attending: Obstetrics and Gynecology | Admitting: Obstetrics and Gynecology

## 2015-10-14 ENCOUNTER — Encounter: Payer: Self-pay | Admitting: Obstetrics and Gynecology

## 2015-10-14 ENCOUNTER — Ambulatory Visit (INDEPENDENT_AMBULATORY_CARE_PROVIDER_SITE_OTHER): Payer: PRIVATE HEALTH INSURANCE | Admitting: Obstetrics and Gynecology

## 2015-10-14 VITALS — BP 158/72 | HR 65 | Ht 64.0 in | Wt 129.4 lb

## 2015-10-14 DIAGNOSIS — N8110 Cystocele, unspecified: Secondary | ICD-10-CM

## 2015-10-14 DIAGNOSIS — N393 Stress incontinence (female) (male): Secondary | ICD-10-CM

## 2015-10-14 DIAGNOSIS — Z01812 Encounter for preprocedural laboratory examination: Secondary | ICD-10-CM | POA: Diagnosis present

## 2015-10-14 DIAGNOSIS — IMO0002 Reserved for concepts with insufficient information to code with codable children: Secondary | ICD-10-CM

## 2015-10-14 DIAGNOSIS — Z01818 Encounter for other preprocedural examination: Secondary | ICD-10-CM

## 2015-10-14 HISTORY — DX: Chronic obstructive pulmonary disease, unspecified: J44.9

## 2015-10-14 LAB — CBC WITH DIFFERENTIAL/PLATELET
BASOS ABS: 0 10*3/uL (ref 0–0.1)
BASOS PCT: 0 %
Eosinophils Absolute: 0.1 10*3/uL (ref 0–0.7)
Eosinophils Relative: 2 %
HEMATOCRIT: 42.2 % (ref 35.0–47.0)
HEMOGLOBIN: 14.4 g/dL (ref 12.0–16.0)
Lymphocytes Relative: 39 %
Lymphs Abs: 2.2 10*3/uL (ref 1.0–3.6)
MCH: 33.4 pg (ref 26.0–34.0)
MCHC: 34.2 g/dL (ref 32.0–36.0)
MCV: 97.4 fL (ref 80.0–100.0)
MONO ABS: 0.6 10*3/uL (ref 0.2–0.9)
Monocytes Relative: 10 %
NEUTROS ABS: 2.7 10*3/uL (ref 1.4–6.5)
NEUTROS PCT: 49 %
Platelets: 133 10*3/uL — ABNORMAL LOW (ref 150–440)
RBC: 4.33 MIL/uL (ref 3.80–5.20)
RDW: 13.7 % (ref 11.5–14.5)
WBC: 5.6 10*3/uL (ref 3.6–11.0)

## 2015-10-14 LAB — TYPE AND SCREEN
ABO/RH(D): AB POS
ANTIBODY SCREEN: NEGATIVE

## 2015-10-14 LAB — BASIC METABOLIC PANEL
ANION GAP: 8 (ref 5–15)
BUN: 13 mg/dL (ref 6–20)
CALCIUM: 9.2 mg/dL (ref 8.9–10.3)
CO2: 26 mmol/L (ref 22–32)
Chloride: 106 mmol/L (ref 101–111)
Creatinine, Ser: 0.88 mg/dL (ref 0.44–1.00)
Glucose, Bld: 93 mg/dL (ref 65–99)
POTASSIUM: 4.1 mmol/L (ref 3.5–5.1)
SODIUM: 140 mmol/L (ref 135–145)

## 2015-10-14 LAB — RAPID HIV SCREEN (HIV 1/2 AB+AG)
HIV 1/2 Antibodies: NONREACTIVE
HIV-1 P24 Antigen - HIV24: NONREACTIVE

## 2015-10-14 NOTE — H&P (Signed)
Subjective: PREOPERATIVE HISTORY AND PHYSICAL      Date of surgery: 10/19/2015 Chief complaint: 1. Second degree cystocele 2. Stress urinary incontinence   Patient is a 55 y.o. [id number] scheduled for TVH BSO with anterior colporrhaphy on 10/19/2015 due to pelvic organ prolapse (cystocele with rotational descent of bladder neck, secondary, uterine prolapse, and SUI)  55 year old single white female, para 2002, menopausal since age 51, status post BTL for contraception, presents for surgical management of pelvic pain and pelvic organ prolapse. Patient states that she has had long-standing low backache and pelvic pain with recent exacerbation over the past week. 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 Long history of pack-a-day smoking since age 89. Patient does have COPD.  GU history: Urinary frequency approximately 15 times a day. No history of nocturia. History of stress urinary incontinence for which she does wear pads. Mild urgency symptoms are present. Past history of 3 UTIs per year without history of pyelonephritis.  GI history: Bowel movements are daily. No history of chronic constipation.   Past OB history: Para 2002. SVD 2 with largest baby 5 lbs. 7 oz.  Past GYN history: Menarche age 47. Menopause age 61. No history of HRT use. She does report vasomotor symptoms including hot flashes and night sweats. No history of abnormal Pap smears. No history of abnormal mammograms. No history of STI/PID.   History of dysmenorrhea. Patient does report past history of deep thrusting dyspareunia; not currently sexually active.  She has been treated with Estrace cream intravaginally for management of vaginal atrophy. Patient has undergone unsuccessful pessary trial for pelvic organ prolapse and stress incontinence and now desires definitive surgical management.  Gynecologic History No LMP recorded (lmp unknown). Patient is  postmenopausal. Contraception: post menopausal status Last Pap: Normal Last mammogram: Normal   Menstrual History: OB History    Gravida Para Term Preterm AB TAB SAB Ectopic Multiple Living   2 2 2       2       No LMP recorded (lmp unknown). Patient is postmenopausal.    Past Medical History  Diagnosis Date  . Bipolar 1 disorder ([place])   . Depression   . Acute hepatitis C   . Hypertension   . AR (allergic rhinitis)   . Bulging lumbar disc     Past Surgical History  Procedure Laterality Date  . Tubal ligation      OB History  Gravida Para Term Preterm AB SAB TAB Ectopic Multiple Living  2 2 2       2     # Outcome Date GA Lbr Len/2nd Weight Sex Delivery Anes PTL Lv  2 Term 1981   5 lb 12.8 oz (2.631 kg) F Vag-Spont   Y  1 Term 1979   4 lb 2.4 oz (1.882 kg) F Vag-Spont   Y      Social History   Social History  . Marital Status: Single    Spouse Name: N/A  . Number of Children: N/A  . Years of Education: N/A   Social History Main Topics  . Smoking status: Current Every Day Smoker -- 1.00 packs/day    Types: Cigarettes  . Smokeless tobacco: Never Used  . Alcohol Use: 7.2 oz/week    12 Cans of beer per week     Comment: weekly  . Drug Use: Yes  . Sexual Activity: No   Other Topics Concern  . Not on file   Social History Narrative  Family History  Problem Relation Age of Onset  . Breast cancer Mother   . Pancreatic cancer Mother   . Cancer Mother     breast and pancreatic  . Hypertension Mother   . Migraines Mother   . Heart disease Father   . Mental illness Sister     depression  . Alcohol abuse Maternal Grandfather   . Cancer Maternal Grandmother     lung and brain  . Mental illness Sister     depression     (Not in a hospital admission)  No Known Allergies  Review of Systems Constitutional: No recent fever/chills/sweats Respiratory: No recent cough/bronchitis Cardiovascular: No chest pain Gastrointestinal: No recent  nausea/vomiting/diarrhea Genitourinary: No UTI symptoms Hematologic/lymphatic:No history of coagulopathy or recent blood thinner use    Objective:    LMP  (LMP Unknown)  General:   Normal  Skin:   normal  HEENT:  Normal  Neck:  Supple without Adenopathy or Thyromegaly  Lungs:   Heart:              Breasts:   Abdomen:  Pelvis:  M/S   Extremeties:  Neuro:    clear to auscultation bilaterally   Normal without murmur   Not Examined   soft, non-tender; bowel sounds normal; no masses,  no organomegaly   Exam deferred to OR  No CVAT  Warm/Dry   Normal       06/30/2015  PELVIC: External Genitalia: Normal BUS: Normal Vagina: Moderately atrophic; Second-degree cystocele With rotational descent of the bladder neck With Valsalva Cervix: No lesions; good support Uterus: Normal size, shape,consistency, mobile Adnexa: Normal RV: Normal ; Mild rectocele Bladder: Nontender  Assessment:   1. Second degree cystocele with rotational descent of bladder neck 2. Mild uterine prolapse 3. Stress urinary incontinence 4. Failed pessary trial    Plan:   TVH BSO with anterior colporrhaphy  Preop counseling: The patient is to undergo transvaginal hysterectomy with bilateral salpingo-oophorectomy and anterior colporrhaphy with Kelly plication for management of symptomatic pelvic organ prolapse and mild stress urinary incontinence. She is understanding of the planned procedures and is aware of and is accepting of all surgical risks which include but are not limited to bleeding, infection, pelvic organ injury with need for repair, blood clot disorders, anesthesia risks, etc. All questions have been answered. Informed consent is given. Patient is ready and willing to proceed with surgery as scheduled.  Brayton Mars, MD  Note: This dictation was prepared with Dragon dictation along with smaller  phrase technology. Any transcriptional errors that result from this process are unintentional.

## 2015-10-14 NOTE — Patient Instructions (Signed)
  Your procedure is scheduled on: 4/24 Monday Report to Day Surgery. To find out your arrival time please call [phone] between 1PM - 3PM on Friday 4/21  Remember: Instructions that are not followed completely may result in serious medical risk, up to and including death, or upon the discretion of your surgeon and anesthesiologist your surgery may need to be rescheduled.    __x__ 1. Do not eat food or drink liquids after midnight. No gum chewing or hard candies.     __x__ 2. No Alcohol for 24 hours before or after surgery.   ____ 3. Bring all medications with you on the day of surgery if instructed.    __x__ 4. Notify your doctor if there is any change in your medical condition     (cold, fever, infections).     Do not wear jewelry, make-up, hairpins, clips or nail polish.  Do not wear lotions, powders, or perfumes. You may wear deodorant.  Do not shave 48 hours prior to surgery. Men may shave face and neck.  Do not bring valuables to the hospital.    [facility] is not responsible for any belongings or valuables.               Contacts, dentures or bridgework may not be worn into surgery.  Leave your suitcase in the car. After surgery it may be brought to your room.  For patients admitted to the hospital, discharge time is determined by your                treatment team.   Patients discharged the day of surgery will not be allowed to drive home.   Please read over the following fact sheets that you were given:   Surgical Site Infection Prevention   ____ Take these medicines the morning of surgery with A SIP OF WATER:    1. metoprolol  2. Tramadol if needed  3. flonase  4.  5.  6.  ____ Fleet Enema (as directed)   _x___ Use CHG Soap as directed  _x___ Use inhalers on the day of surgery  ____ Stop metformin 2 days prior to surgery    ____ Take 1/2 of usual insulin dose the night before surgery and none on the morning of surgery.   ____ Stop  Coumadin/Plavix/aspirin on   ___x_ Stop Anti-inflammatories Naprosyn and BC today   ____ Stop supplements until after surgery.    ____ Bring C-Pap to the hospital.

## 2015-10-14 NOTE — Progress Notes (Signed)
Subjective: PREOPERATIVE HISTORY AND PHYSICAL      Date of surgery: 10/19/2015 Chief complaint: 1. Second degree cystocele 2. Stress urinary incontinence   Patient is a 55 y.o. G2P2037female scheduled for TVH BSO with anterior colporrhaphy on 10/19/2015 due to pelvic organ prolapse (cystocele with rotational descent of bladder neck, secondary, uterine prolapse, and SUI)  55 year old single white female, para 2002, menopausal since age 58, status post BTL for contraception, presents for surgical management of pelvic pain and pelvic organ prolapse. Patient states that she has had long-standing low backache and pelvic pain with recent exacerbation over the past week. 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 Long history of pack-a-day smoking since age 64. Patient does have COPD.  GU history: Urinary frequency approximately 15 times a day. No history of nocturia. History of stress urinary incontinence for which she does wear pads. Mild urgency symptoms are present. Past history of 3 UTIs per year without history of pyelonephritis.  GI history: Bowel movements are daily. No history of chronic constipation.   Past OB history: Para 2002. SVD 2 with largest baby 5 lbs. 7 oz.  Past GYN history: Menarche age 52. Menopause age 65. No history of HRT use. She does report vasomotor symptoms including hot flashes and night sweats. No history of abnormal Pap smears. No history of abnormal mammograms. No history of STI/PID.   History of dysmenorrhea. Patient does report past history of deep thrusting dyspareunia; not currently sexually active.  She has been treated with Estrace cream intravaginally for management of vaginal atrophy. Patient has undergone unsuccessful pessary trial for pelvic organ prolapse and stress incontinence and now desires definitive surgical management.  Gynecologic History No LMP recorded (lmp unknown). Patient is  postmenopausal. Contraception: post menopausal status Last Pap: Normal Last mammogram: Normal    Menstrual History: OB History    Gravida Para Term Preterm AB TAB SAB Ectopic Multiple Living   2 2 2       2       No LMP recorded (lmp unknown). Patient is postmenopausal.    Past Medical History  Diagnosis Date  . Bipolar 1 disorder ([place])   . Depression   . Acute hepatitis C   . Hypertension   . AR (allergic rhinitis)   . Bulging lumbar disc     Past Surgical History  Procedure Laterality Date  . Tubal ligation      OB History  Gravida Para Term Preterm AB SAB TAB Ectopic Multiple Living  2 2 2       2     # Outcome Date GA Lbr Len/2nd Weight Sex Delivery Anes PTL Lv  2 Term 1981   5 lb 12.8 oz (2.631 kg) F Vag-Spont   Y  1 Term 1979   4 lb 2.4 oz (1.882 kg) F Vag-Spont   Y      Social History   Social History  . Marital Status: Single    Spouse Name: N/A  . Number of Children: N/A  . Years of Education: N/A   Social History Main Topics  . Smoking status: Current Every Day Smoker -- 1.00 packs/day    Types: Cigarettes  . Smokeless tobacco: Never Used  . Alcohol Use: 7.2 oz/week    12 Cans of beer per week     Comment: weekly  . Drug Use: Yes  . Sexual Activity: No   Other Topics Concern  . Not on file   Social History Narrative  Family History  Problem Relation Age of Onset  . Breast cancer Mother   . Pancreatic cancer Mother   . Cancer Mother     breast and pancreatic  . Hypertension Mother   . Migraines Mother   . Heart disease Father   . Mental illness Sister     depression  . Alcohol abuse Maternal Grandfather   . Cancer Maternal Grandmother     lung and brain  . Mental illness Sister     depression     (Not in a hospital admission)  No Known Allergies  Review of Systems Constitutional: No recent fever/chills/sweats Respiratory: No recent cough/bronchitis Cardiovascular: No chest pain Gastrointestinal: No recent  nausea/vomiting/diarrhea Genitourinary: No UTI symptoms Hematologic/lymphatic:No history of coagulopathy or recent blood thinner use    Objective:    LMP  (LMP Unknown)  General:   Normal  Skin:   normal  HEENT:  Normal  Neck:  Supple without Adenopathy or Thyromegaly  Lungs:   Heart:              Breasts:   Abdomen:  Pelvis:  M/S   Extremeties:  Neuro:    clear to auscultation bilaterally   Normal without murmur   Not Examined   soft, non-tender; bowel sounds normal; no masses,  no organomegaly   Exam deferred to OR  No CVAT  Warm/Dry   Normal       06/30/2015  PELVIC: External Genitalia: Normal BUS: Normal Vagina: Moderately atrophic; Second-degree cystocele With rotational descent of the bladder neck With Valsalva Cervix: No lesions; good support Uterus: Normal size, shape,consistency, mobile Adnexa: Normal RV: Normal ; Mild rectocele Bladder: Nontender  Assessment:   1. Second degree cystocele with rotational descent of bladder neck 2. Mild uterine prolapse 3. Stress urinary incontinence 4. Failed pessary trial    Plan:   TVH BSO with anterior colporrhaphy  Preop counseling: The patient is to undergo transvaginal hysterectomy with bilateral salpingo-oophorectomy and anterior colporrhaphy with Kelly plication for management of symptomatic pelvic organ prolapse and mild stress urinary incontinence. She is understanding of the planned procedures and is aware of and is accepting of all surgical risks which include but are not limited to bleeding, infection, pelvic organ injury with need for repair, blood clot disorders, anesthesia risks, etc. All questions have been answered. Informed consent is given. Patient is ready and willing to proceed with surgery as scheduled.  Brayton Mars, MD  Note: This dictation was prepared with Dragon dictation along with smaller  phrase technology. Any transcriptional errors that result from this process are unintentional.

## 2015-10-14 NOTE — Patient Instructions (Addendum)
Pt is scheduled for vaginal hysterectomy on 10/19/2015. She will be out of work for approximately 6 weeks. Return for postop check: 10/27/2015

## 2015-10-15 LAB — ABO/RH: ABO/RH(D): AB POS

## 2015-10-15 LAB — RPR: RPR: NONREACTIVE

## 2015-10-19 ENCOUNTER — Ambulatory Visit: Payer: PRIVATE HEALTH INSURANCE | Admitting: Anesthesiology

## 2015-10-19 ENCOUNTER — Encounter: Payer: Self-pay | Admitting: *Deleted

## 2015-10-19 ENCOUNTER — Encounter
Admission: RE | Disposition: A | Discharge status: Home / Self Care | Payer: Self-pay | Source: Ambulatory Visit | Attending: Obstetrics and Gynecology

## 2015-10-19 ENCOUNTER — Other Ambulatory Visit: Payer: Self-pay

## 2015-10-19 ENCOUNTER — Observation Stay
Admission: RE | Admit: 2015-10-19 | Discharge: 2015-10-20 | Disposition: A | Discharge status: Home / Self Care | Payer: PRIVATE HEALTH INSURANCE | Source: Ambulatory Visit | Attending: Obstetrics and Gynecology | Admitting: Obstetrics and Gynecology

## 2015-10-19 DIAGNOSIS — F17210 Nicotine dependence, cigarettes, uncomplicated: Secondary | ICD-10-CM | POA: Insufficient documentation

## 2015-10-19 DIAGNOSIS — N393 Stress incontinence (female) (male): Secondary | ICD-10-CM | POA: Diagnosis not present

## 2015-10-19 DIAGNOSIS — M545 Low back pain: Secondary | ICD-10-CM | POA: Insufficient documentation

## 2015-10-19 DIAGNOSIS — N72 Inflammatory disease of cervix uteri: Secondary | ICD-10-CM | POA: Insufficient documentation

## 2015-10-19 DIAGNOSIS — J449 Chronic obstructive pulmonary disease, unspecified: Secondary | ICD-10-CM | POA: Diagnosis not present

## 2015-10-19 DIAGNOSIS — R102 Pelvic and perineal pain: Secondary | ICD-10-CM | POA: Insufficient documentation

## 2015-10-19 DIAGNOSIS — N814 Uterovaginal prolapse, unspecified: Secondary | ICD-10-CM | POA: Diagnosis present

## 2015-10-19 DIAGNOSIS — N952 Postmenopausal atrophic vaginitis: Secondary | ICD-10-CM

## 2015-10-19 DIAGNOSIS — N8110 Cystocele, unspecified: Secondary | ICD-10-CM | POA: Diagnosis not present

## 2015-10-19 DIAGNOSIS — D251 Intramural leiomyoma of uterus: Secondary | ICD-10-CM | POA: Diagnosis not present

## 2015-10-19 DIAGNOSIS — Z780 Asymptomatic menopausal state: Secondary | ICD-10-CM

## 2015-10-19 DIAGNOSIS — IMO0002 Reserved for concepts with insufficient information to code with codable children: Secondary | ICD-10-CM

## 2015-10-19 DIAGNOSIS — N812 Incomplete uterovaginal prolapse: Secondary | ICD-10-CM | POA: Diagnosis not present

## 2015-10-19 DIAGNOSIS — I10 Essential (primary) hypertension: Secondary | ICD-10-CM | POA: Insufficient documentation

## 2015-10-19 DIAGNOSIS — F319 Bipolar disorder, unspecified: Secondary | ICD-10-CM | POA: Diagnosis not present

## 2015-10-19 DIAGNOSIS — Z90710 Acquired absence of both cervix and uterus: Secondary | ICD-10-CM

## 2015-10-19 HISTORY — PX: CYSTOCELE REPAIR: SHX163

## 2015-10-19 HISTORY — PX: VAGINAL HYSTERECTOMY: SHX2639

## 2015-10-19 SURGERY — HYSTERECTOMY, VAGINAL
Anesthesia: General | Wound class: Clean Contaminated

## 2015-10-19 MED ORDER — CEFAZOLIN SODIUM-DEXTROSE 2-4 GM/100ML-% IV SOLN
INTRAVENOUS | Status: AC
  Administered 2015-10-19: 2 g via INTRAVENOUS
  Filled 2015-10-19: qty 100

## 2015-10-19 MED ORDER — KETOROLAC TROMETHAMINE 30 MG/ML IJ SOLN: 30.0000 mg | Freq: Four times a day (QID) | INTRAMUSCULAR | Status: DC

## 2015-10-19 MED ORDER — MORPHINE SULFATE (PF) 2 MG/ML IV SOLN
1.0000 mg | INTRAVENOUS | Status: DC | PRN
  Administered 2015-10-19: 2 mg via INTRAVENOUS
  Filled 2015-10-19: qty 1

## 2015-10-19 MED ORDER — ONDANSETRON HCL 4 MG/2ML IJ SOLN: 4.0000 mg | Freq: Four times a day (QID) | INTRAMUSCULAR | Status: DC | PRN

## 2015-10-19 MED ORDER — AMMONIA AROMATIC IN INHA
RESPIRATORY_TRACT | Status: AC
  Filled 2015-10-19: qty 10

## 2015-10-19 MED ORDER — ESTROGENS, CONJUGATED 0.625 MG/GM VA CREA
TOPICAL_CREAM | VAGINAL | Status: AC
  Filled 2015-10-19: qty 30

## 2015-10-19 MED ORDER — LACTATED RINGERS IV SOLN
INTRAVENOUS | Status: DC
  Administered 2015-10-19 – 2015-10-20 (×2): via INTRAVENOUS

## 2015-10-19 MED ORDER — OXYCODONE-ACETAMINOPHEN 5-325 MG PO TABS
1.0000 | ORAL_TABLET | ORAL | Status: DC | PRN
  Administered 2015-10-19 – 2015-10-20 (×5): 2 via ORAL
  Filled 2015-10-19 (×5): qty 2

## 2015-10-19 MED ORDER — ROCURONIUM BROMIDE 100 MG/10ML IV SOLN
INTRAVENOUS | Status: DC | PRN
  Administered 2015-10-19: 40 mg via INTRAVENOUS

## 2015-10-19 MED ORDER — ESTROGENS, CONJUGATED 0.625 MG/GM VA CREA
TOPICAL_CREAM | VAGINAL | Status: DC | PRN
  Administered 2015-10-19: 1 via VAGINAL

## 2015-10-19 MED ORDER — FENTANYL CITRATE (PF) 100 MCG/2ML IJ SOLN
INTRAMUSCULAR | Status: AC
  Administered 2015-10-19: 25 ug via INTRAVENOUS
  Filled 2015-10-19: qty 2

## 2015-10-19 MED ORDER — ACETAMINOPHEN 325 MG PO TABS: 650.0000 mg | ORAL_TABLET | ORAL | Status: DC | PRN

## 2015-10-19 MED ORDER — FENTANYL CITRATE (PF) 100 MCG/2ML IJ SOLN
25.0000 ug | INTRAMUSCULAR | Status: AC | PRN
  Administered 2015-10-19 (×6): 25 ug via INTRAVENOUS

## 2015-10-19 MED ORDER — GLYCOPYRROLATE 0.2 MG/ML IJ SOLN
INTRAMUSCULAR | Status: DC | PRN
  Administered 2015-10-19: 0.2 mg via INTRAVENOUS

## 2015-10-19 MED ORDER — CEFAZOLIN SODIUM-DEXTROSE 2-4 GM/100ML-% IV SOLN
2.0000 g | INTRAVENOUS | Status: AC
  Administered 2015-10-19: 2 g via INTRAVENOUS

## 2015-10-19 MED ORDER — LIDOCAINE HCL (CARDIAC) 20 MG/ML IV SOLN
INTRAVENOUS | Status: DC | PRN
  Administered 2015-10-19: 50 mg via INTRAVENOUS

## 2015-10-19 MED ORDER — BISACODYL 10 MG RE SUPP: 10.0000 mg | Freq: Every day | RECTAL | Status: DC | PRN

## 2015-10-19 MED ORDER — DEXMEDETOMIDINE HCL 200 MCG/2ML IV SOLN
INTRAVENOUS | Status: DC | PRN
  Administered 2015-10-19: 8 ug via INTRAVENOUS

## 2015-10-19 MED ORDER — KETOROLAC TROMETHAMINE 30 MG/ML IJ SOLN
INTRAMUSCULAR | Status: DC | PRN
Start: 2015-10-19 — End: 2015-10-19
  Administered 2015-10-19: 30 mg via INTRAVENOUS

## 2015-10-19 MED ORDER — FENTANYL CITRATE (PF) 100 MCG/2ML IJ SOLN
INTRAMUSCULAR | Status: DC | PRN
Start: 2015-10-19 — End: 2015-10-19
  Administered 2015-10-19 (×4): 50 ug via INTRAVENOUS
  Administered 2015-10-19: 100 ug via INTRAVENOUS

## 2015-10-19 MED ORDER — KETOROLAC TROMETHAMINE 30 MG/ML IJ SOLN
30.0000 mg | Freq: Four times a day (QID) | INTRAMUSCULAR | Status: DC
  Administered 2015-10-19 – 2015-10-20 (×3): 30 mg via INTRAVENOUS
  Filled 2015-10-19 (×3): qty 1

## 2015-10-19 MED ORDER — LACTATED RINGERS IV SOLN: INTRAVENOUS | Status: DC

## 2015-10-19 MED ORDER — LACTATED RINGERS IV SOLN
INTRAVENOUS | Status: DC
  Administered 2015-10-19 (×2): via INTRAVENOUS

## 2015-10-19 MED ORDER — ONDANSETRON HCL 4 MG/2ML IJ SOLN: 4.0000 mg | Freq: Once | INTRAMUSCULAR | Status: DC | PRN

## 2015-10-19 MED ORDER — ACETAMINOPHEN 10 MG/ML IV SOLN
INTRAVENOUS | Status: AC
  Filled 2015-10-19: qty 100

## 2015-10-19 MED ORDER — ACETAMINOPHEN 10 MG/ML IV SOLN
INTRAVENOUS | Status: DC | PRN
  Administered 2015-10-19: 1000 mg via INTRAVENOUS

## 2015-10-19 MED ORDER — SUGAMMADEX SODIUM 200 MG/2ML IV SOLN
INTRAVENOUS | Status: DC | PRN
  Administered 2015-10-19: 117 mg via INTRAVENOUS

## 2015-10-19 MED ORDER — SIMETHICONE 80 MG PO CHEW: 80.0000 mg | CHEWABLE_TABLET | Freq: Four times a day (QID) | ORAL | Status: DC | PRN

## 2015-10-19 MED ORDER — PROPOFOL 10 MG/ML IV BOLUS
INTRAVENOUS | Status: DC | PRN
  Administered 2015-10-19: 150 mg via INTRAVENOUS

## 2015-10-19 MED ORDER — DOCUSATE SODIUM 100 MG PO CAPS
100.0000 mg | ORAL_CAPSULE | Freq: Two times a day (BID) | ORAL | Status: DC
  Administered 2015-10-19: 100 mg via ORAL
  Filled 2015-10-19: qty 1

## 2015-10-19 MED ORDER — MIDAZOLAM HCL 2 MG/2ML IJ SOLN
INTRAMUSCULAR | Status: DC | PRN
  Administered 2015-10-19: 2 mg via INTRAVENOUS

## 2015-10-19 SURGICAL SUPPLY — 33 items
BAG URO DRAIN 2000ML W/SPOUT (MISCELLANEOUS) ×4 IMPLANT
CANISTER SUCT 1200ML W/VALVE (MISCELLANEOUS) ×4 IMPLANT
CATH FOLEY 2WAY  5CC 16FR (CATHETERS) ×2
CATH URTH 16FR FL 2W BLN LF (CATHETERS) ×2 IMPLANT
DRAPE PERI LITHO V/GYN (MISCELLANEOUS) ×4 IMPLANT
DRAPE SHEET LG 3/4 BI-LAMINATE (DRAPES) ×4 IMPLANT
DRAPE UNDER BUTTOCK W/FLU (DRAPES) ×4 IMPLANT
ELECT REM PT RETURN 9FT ADLT (ELECTROSURGICAL) ×4
ELECTRODE REM PT RTRN 9FT ADLT (ELECTROSURGICAL) ×2 IMPLANT
GAUZE PACK 2X3YD (MISCELLANEOUS) ×4 IMPLANT
GLOVE BIO SURGEON STRL SZ8 (GLOVE) ×8 IMPLANT
GLOVE INDICATOR 8.0 STRL GRN (GLOVE) ×4 IMPLANT
GOWN STRL REUS W/ TWL LRG LVL3 (GOWN DISPOSABLE) ×4 IMPLANT
GOWN STRL REUS W/ TWL XL LVL3 (GOWN DISPOSABLE) ×4 IMPLANT
GOWN STRL REUS W/TWL LRG LVL3 (GOWN DISPOSABLE) ×4
GOWN STRL REUS W/TWL XL LVL3 (GOWN DISPOSABLE) ×4
KIT RM TURNOVER CYSTO AR (KITS) ×4 IMPLANT
LABEL OR SOLS (LABEL) ×4 IMPLANT
NS IRRIG 500ML POUR BTL (IV SOLUTION) ×4 IMPLANT
PACK BASIN MINOR ARMC (MISCELLANEOUS) ×4 IMPLANT
PAD OB MATERNITY 4.3X12.25 (PERSONAL CARE ITEMS) ×4 IMPLANT
PAD PREP 24X41 OB/GYN DISP (PERSONAL CARE ITEMS) ×4 IMPLANT
SOL PREP PVP 2OZ (MISCELLANEOUS) ×4
SOLUTION PREP PVP 2OZ (MISCELLANEOUS) ×2 IMPLANT
SUT CHROMIC 0 CT 1 (SUTURE) ×8 IMPLANT
SUT CHROMIC 1-0 (SUTURE) ×4 IMPLANT
SUT CHROMIC 2 0 CT 1 (SUTURE) ×24 IMPLANT
SUT VIC AB 0 CT1 27 (SUTURE) ×6
SUT VIC AB 0 CT1 27XCR 8 STRN (SUTURE) ×6 IMPLANT
SUT VIC AB 0 CT1 36 (SUTURE) ×8 IMPLANT
SUT VIC AB 0 CT2 27 (SUTURE) ×8 IMPLANT
SUT VIC AB 2-0 UR6 27 (SUTURE) ×8 IMPLANT
SYRINGE 10CC LL (SYRINGE) ×4 IMPLANT

## 2015-10-19 NOTE — Anesthesia Postprocedure Evaluation (Signed)
Anesthesia Post Note  Patient: Monique Tucker  Procedure(s) Performed: Procedure(s) (LRB): HYSTERECTOMY VAGINAL WITH BILATERAL SALPINGO-OOPHERECTOMY (Bilateral) ANTERIOR REPAIR (CYSTOCELE) (N/A)  Patient location during evaluation: PACU Anesthesia Type: General Level of consciousness: awake and alert Pain management: pain level controlled Vital Signs Assessment: post-procedure vital signs reviewed and stable Respiratory status: spontaneous breathing and respiratory function stable Cardiovascular status: stable Anesthetic complications: no    Last Vitals:  Filed Vitals:   10/19/15 1034 10/19/15 1145  BP: 134/68   Pulse: 65   Temp: 36.8 C 36.2 C  Resp: 18     Last Pain:  Filed Vitals:   10/19/15 1145  PainSc: 0-No pain                 KEPHART,WILLIAM K

## 2015-10-19 NOTE — Op Note (Signed)
OPERATIVE NOTE:  SOLENA CRISMAN PROCEDURE DATE: 10/19/2015   PREOPERATIVE DIAGNOSIS:  1. Second-degree cystocele 2. Uterine prolapse 3. Stress urinary incontinence  POSTOPERATIVE DIAGNOSIS: Same as above  PROCEDURE:  1. TVH BSO 2. Anterior colporrhaphy with Claiborne Billings plication   SURGEON:  Brayton Mars, MD ASSISTANTS: Dr. Marcelline Mates and PA-S Brigid Re ANESTHESIA: General INDICATIONS: 55 y.o. [id number] with symptomatic cystocele and uterine prolapse with mild stress urinary incontinence presents for surgical management  FINDINGS:   1. Small uterus with cervix at the introitus 2. Second-degree cystocele 3. Normal-appearing tubes and ovaries   I/O's: Total I/O In: 1200 [I.V.:1200] Out: 200 [Urine:100; Blood:100] COUNTS:  YES SPECIMENS: Uterus and cervix, bilateral fallopian tubes, bilateral ovaries ANTIBIOTIC PROPHYLAXIS:Ancef 2 grams COMPLICATIONS: None immediate  PROCEDURE IN DETAIL:  Patient brought to the operating room she was placed in the supine position. General endotracheal anesthesia was induced without difficulty. She was placed in the dorsal lithotomy position using candycane stirrups. A Betadine abdominal perineal intravaginal prep and drape was performed in standard fashion. Timeout was completed. Foley catheter was placed and was draining clear yellow urine. Weighted speculum was placed in the vagina. Double-tooth tenaculum was placed onto the cervix. Posterior colpotomy was made with Mayo scissors. Uterosacral ligaments were clamped cut and stick tied using 0 Vicryl suture. The cervix was circumscribed with Bovie cautery. The vagina and bladder were dissected off lower uterine segment through sharp and blunt dissection. Eventually the anterior cul-de-sac was entered. The cardinal broad ligament complexes were then sequentially clamped cut and stick tied using 0 Vicryl suture. This was done bilaterally up to the level of the utero-ovarian ligaments. At this point the  utero-ovarian ligaments were crossclamped and cut and the uterus and cervix were removed from the operative field. The ovarian pedicles were stick tied. The posterior vaginal cuff was run with 0 Vicryl suture using a baseball stitch technique. Assessment of the adnexa were notable for normal-appearing tubes and ovaries. Each infundibulopelvic ligament was then isolated crossclamped with curved Heaney clamp and then excised with Mayo scissors. These pedicles were then doubly ligated using 0 Vicryl suture. First tie was a free tie. The second tie was a stick tie. Good hemostasis was obtained. Peritoneum was then reapproximated using a pursestring stitch of 0 Vicryl. Following completion of the hysterectomy, the anterior colporrhaphy was performed in standard fashion. The angles of the vagina on the anterior vaginal mucosa were grasped with Allis clamps. The vagina was undermined with Metzenbaum scissors and incised in the midline up to the level of the urethral meatus. The perivesical fascia was dissected off the vagina through sharp and blunt dissection. Once adequately mobilized vertical mattress sutures of 0 Vicryl were placed to reduce the cystocele. The initial stitches were Kelly plication stitches just underneath the urethral meatus. Upon completion of the anterior colporrhaphy the excess vagina was trimmed and the vaginal mucosa was reapproximated in the midline using 2-0 chromic sutures in a simple interrupted technique. The vagina was then packed with Kerlix/Premarin cream. Patient was then awakened extubated and taken the recovery room in satisfactory condition  Greysen Swanton A. Zipporah Plants, MD, ACOG [facility]

## 2015-10-19 NOTE — Anesthesia Preprocedure Evaluation (Addendum)
Anesthesia Evaluation  Patient identified by MRN, date of birth, ID band Patient awake    Reviewed: Allergy & Precautions, NPO status , Patient's Chart, lab work & pertinent test results  History of Anesthesia Complications Negative for: history of anesthetic complications  Airway Mallampati: II       Dental   Pulmonary COPD,  COPD inhaler, Current Smoker,           Cardiovascular hypertension, Pt. on medications and Pt. on home beta blockers      Neuro/Psych Depression Bipolar Disorder negative neurological ROS     GI/Hepatic negative GI ROS, (+) Hepatitis -, C  Endo/Other  negative endocrine ROS  Renal/GU negative Renal ROS     Musculoskeletal   Abdominal   Peds  Hematology negative hematology ROS (+)   Anesthesia Other Findings   Reproductive/Obstetrics                            Anesthesia Physical Anesthesia Plan  ASA: III  Anesthesia Plan:    Post-op Pain Management:    Induction: Intravenous  Airway Management Planned: Oral ETT  Additional Equipment:   Intra-op Plan:   Post-operative Plan:   Informed Consent: I have reviewed the patients History and Physical, chart, labs and discussed the procedure including the risks, benefits and alternatives for the proposed anesthesia with the patient or authorized representative who has indicated his/her understanding and acceptance.     Plan Discussed with:   Anesthesia Plan Comments:         Anesthesia Quick Evaluation

## 2015-10-19 NOTE — Anesthesia Procedure Notes (Signed)
Procedure Name: Intubation Performed by: Rolla Plate Pre-anesthesia Checklist: Patient identified, Patient being monitored, Timeout performed, Emergency Drugs available and Suction available Patient Re-evaluated:Patient Re-evaluated prior to inductionOxygen Delivery Method: Circle system utilized Preoxygenation: Pre-oxygenation with 100% oxygen Intubation Type: IV induction Ventilation: Mask ventilation without difficulty Laryngoscope Size: 3 and Miller Grade View: Grade I Tube type: Oral Tube size: 7.0 mm Number of attempts: 1 Airway Equipment and Method: Stylet Placement Confirmation: ETT inserted through vocal cords under direct vision,  positive ETCO2 and breath sounds checked- equal and bilateral Secured at: 21 cm Tube secured with: Tape Dental Injury: Teeth and Oropharynx as per pre-operative assessment

## 2015-10-19 NOTE — Interval H&P Note (Signed)
History and Physical Interval Note:  10/19/2015 11:39 AM  Monique Tucker  has presented today for surgery, with the diagnosis of cystocele, vaginal atrophy  The various methods of treatment have been discussed with the patient and family. After consideration of risks, benefits and other options for treatment, the patient has consented to  Procedure(s): HYSTERECTOMY VAGINAL WITH BILATERAL SALPINGO-OOPHERECTOMY (Bilateral) ANTERIOR REPAIR (CYSTOCELE) (N/A) as a surgical intervention .  The patient's history has been reviewed, patient examined, no change in status, stable for surgery.  I have reviewed the patient's chart and labs.  Questions were answered to the patient's satisfaction.     Hassell Done A Toney Difatta

## 2015-10-19 NOTE — Telephone Encounter (Signed)
Call pt 

## 2015-10-19 NOTE — Transfer of Care (Signed)
Immediate Anesthesia Transfer of Care Note  Patient: Monique Tucker  Procedure(s) Performed: Procedure(s): HYSTERECTOMY VAGINAL WITH BILATERAL SALPINGO-OOPHERECTOMY (Bilateral) ANTERIOR REPAIR (CYSTOCELE) (N/A)  Patient Location: PACU  Anesthesia Type:General  Level of Consciousness: awake, alert  and oriented  Airway & Oxygen Therapy: Patient Spontanous Breathing and Patient connected to face mask oxygen  Post-op Assessment: Report given to RN and Post -op Vital signs reviewed and unstable, Anesthesiologist notified  Post vital signs: Reviewed and stable  Last Vitals:  Filed Vitals:   10/19/15 1145 10/19/15 1356  BP:  133/65  Pulse:  64  Temp: 36.2 C 36.2 C  Resp:  14    Complications: No apparent anesthesia complications

## 2015-10-19 NOTE — H&P (View-Only) (Signed)
Subjective: PREOPERATIVE HISTORY AND PHYSICAL      Date of surgery: 10/19/2015 Chief complaint: 1. Second degree cystocele 2. Stress urinary incontinence   Patient is a 55 y.o. G2P2087female scheduled for TVH BSO with anterior colporrhaphy on 10/19/2015 due to pelvic organ prolapse (cystocele with rotational descent of bladder neck, secondary, uterine prolapse, and SUI)  55 year old single white female, para 2002, menopausal since age 89, status post BTL for contraception, presents for surgical management of pelvic pain and pelvic organ prolapse. Patient states that she has had long-standing low backache and pelvic pain with recent exacerbation over the past week. 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 Long history of pack-a-day smoking since age 10. Patient does have COPD.  GU history: Urinary frequency approximately 15 times a day. No history of nocturia. History of stress urinary incontinence for which she does wear pads. Mild urgency symptoms are present. Past history of 3 UTIs per year without history of pyelonephritis.  GI history: Bowel movements are daily. No history of chronic constipation.   Past OB history: Para 2002. SVD 2 with largest baby 5 lbs. 7 oz.  Past GYN history: Menarche age 33. Menopause age 67. No history of HRT use. She does report vasomotor symptoms including hot flashes and night sweats. No history of abnormal Pap smears. No history of abnormal mammograms. No history of STI/PID.   History of dysmenorrhea. Patient does report past history of deep thrusting dyspareunia; not currently sexually active.  She has been treated with Estrace cream intravaginally for management of vaginal atrophy. Patient has undergone unsuccessful pessary trial for pelvic organ prolapse and stress incontinence and now desires definitive surgical management.  Gynecologic History No LMP recorded (lmp unknown). Patient is  postmenopausal. Contraception: post menopausal status Last Pap: Normal Last mammogram: Normal   Menstrual History: OB History    Gravida Para Term Preterm AB TAB SAB Ectopic Multiple Living   2 2 2       2       No LMP recorded (lmp unknown). Patient is postmenopausal.    Past Medical History  Diagnosis Date  . Bipolar 1 disorder ([place])   . Depression   . Acute hepatitis C   . Hypertension   . AR (allergic rhinitis)   . Bulging lumbar disc     Past Surgical History  Procedure Laterality Date  . Tubal ligation      OB History  Gravida Para Term Preterm AB SAB TAB Ectopic Multiple Living  2 2 2       2     # Outcome Date GA Lbr Len/2nd Weight Sex Delivery Anes PTL Lv  2 Term 1981   5 lb 12.8 oz (2.631 kg) F Vag-Spont   Y  1 Term 1979   4 lb 2.4 oz (1.882 kg) F Vag-Spont   Y      Social History   Social History  . Marital Status: Single    Spouse Name: N/A  . Number of Children: N/A  . Years of Education: N/A   Social History Main Topics  . Smoking status: Current Every Day Smoker -- 1.00 packs/day    Types: Cigarettes  . Smokeless tobacco: Never Used  . Alcohol Use: 7.2 oz/week    12 Cans of beer per week     Comment: weekly  . Drug Use: Yes  . Sexual Activity: No   Other Topics Concern  . Not on file   Social History Narrative  Family History  Problem Relation Age of Onset  . Breast cancer Mother   . Pancreatic cancer Mother   . Cancer Mother     breast and pancreatic  . Hypertension Mother   . Migraines Mother   . Heart disease Father   . Mental illness Sister     depression  . Alcohol abuse Maternal Grandfather   . Cancer Maternal Grandmother     lung and brain  . Mental illness Sister     depression     (Not in a hospital admission)  No Known Allergies  Review of Systems Constitutional: No recent fever/chills/sweats Respiratory: No recent cough/bronchitis Cardiovascular: No chest pain Gastrointestinal: No recent  nausea/vomiting/diarrhea Genitourinary: No UTI symptoms Hematologic/lymphatic:No history of coagulopathy or recent blood thinner use    Objective:    LMP  (LMP Unknown)  General:   Normal  Skin:   normal  HEENT:  Normal  Neck:  Supple without Adenopathy or Thyromegaly  Lungs:   Heart:              Breasts:   Abdomen:  Pelvis:  M/S   Extremeties:  Neuro:    clear to auscultation bilaterally   Normal without murmur   Not Examined   soft, non-tender; bowel sounds normal; no masses,  no organomegaly   Exam deferred to OR  No CVAT  Warm/Dry   Normal       06/30/2015  PELVIC: External Genitalia: Normal BUS: Normal Vagina: Moderately atrophic; Second-degree cystocele With rotational descent of the bladder neck With Valsalva Cervix: No lesions; good support Uterus: Normal size, shape,consistency, mobile Adnexa: Normal RV: Normal ; Mild rectocele Bladder: Nontender  Assessment:   1. Second degree cystocele with rotational descent of bladder neck 2. Mild uterine prolapse 3. Stress urinary incontinence 4. Failed pessary trial    Plan:   TVH BSO with anterior colporrhaphy  Preop counseling: The patient is to undergo transvaginal hysterectomy with bilateral salpingo-oophorectomy and anterior colporrhaphy with Kelly plication for management of symptomatic pelvic organ prolapse and mild stress urinary incontinence. She is understanding of the planned procedures and is aware of and is accepting of all surgical risks which include but are not limited to bleeding, infection, pelvic organ injury with need for repair, blood clot disorders, anesthesia risks, etc. All questions have been answered. Informed consent is given. Patient is ready and willing to proceed with surgery as scheduled.  Brayton Mars, MD  Note: This dictation was prepared with Dragon dictation along with smaller  phrase technology. Any transcriptional errors that result from this process are unintentional.

## 2015-10-20 ENCOUNTER — Encounter: Payer: Self-pay | Admitting: Obstetrics and Gynecology

## 2015-10-20 DIAGNOSIS — N812 Incomplete uterovaginal prolapse: Secondary | ICD-10-CM | POA: Diagnosis not present

## 2015-10-20 DIAGNOSIS — Z90710 Acquired absence of both cervix and uterus: Secondary | ICD-10-CM

## 2015-10-20 LAB — HEMOGLOBIN: Hemoglobin: 13 g/dL (ref 12.0–16.0)

## 2015-10-20 LAB — SURGICAL PATHOLOGY

## 2015-10-20 MED ORDER — IBUPROFEN 800 MG PO TABS: 800.0000 mg | ORAL_TABLET | Freq: Three times a day (TID) | ORAL | Status: DC

## 2015-10-20 MED ORDER — OXYCODONE-ACETAMINOPHEN 5-325 MG PO TABS: 1.0000 | ORAL_TABLET | ORAL | Status: DC | PRN

## 2015-10-20 MED ORDER — DOCUSATE SODIUM 100 MG PO CAPS: 100.0000 mg | ORAL_CAPSULE | Freq: Two times a day (BID) | ORAL | Status: DC

## 2015-10-20 NOTE — Discharge Summary (Signed)
Physician Discharge Summary  Patient ID: Monique Tucker MRN: [id number] DOB/AGE: 08-08-1960 55 y.o.  Admit date: 10/19/2015 Discharge date: 10/20/2015  Admission Diagnoses: Uterine prolapse; cystocele; stress urinary incontinence  Discharge Diagnoses:  Same as above  Operative Procedures: Procedure(s): HYSTERECTOMY VAGINAL WITH BILATERAL SALPINGO-OOPHERECTOMY (Bilateral) ANTERIOR REPAIR (CYSTOCELE) (N/A)  Hospital Course: Uncomplicated.   Significant Diagnostic Studies:  Lab Results  Component Value Date   HGB 13.0 10/20/2015   HGB 14.4 10/14/2015   HGB 15.7* 01/05/2009   Lab Results  Component Value Date   HCT 42.2 10/14/2015   HCT 45.6 01/05/2009   CBC Latest Ref Rng 10/20/2015 10/14/2015 01/05/2009  WBC 3.6 - 11.0 K/uL - 5.6 7.2  Hemoglobin 12.0 - 16.0 g/dL 13.0 14.4 15.7(H)  Hematocrit 35.0 - 47.0 % - 42.2 45.6  Platelets 150 - 440 K/uL - 133(L) 185     Discharged Condition: good  Discharge Exam: Blood pressure 118/78, pulse 62, temperature 97.9 F (36.6 C), temperature source Oral, resp. rate 18, height 5\' 4"  (1.626 m), weight 129 lb (58.514 kg), SpO2 95 %. Incision/Wound: N/A  Disposition: Final discharge disposition not confirmed  Discharge Instructions    Discharge patient    Complete by:  As directed             Medication List    TAKE these medications        budesonide-formoterol 160-4.5 MCG/ACT inhaler  Commonly known as:  SYMBICORT  Inhale 2 puffs into the lungs 2 (two) times daily.     cetirizine-pseudoephedrine 5-120 MG tablet  Commonly known as:  ZYRTEC-D  Take 1 tablet by mouth daily as needed (takes in the fall mostly). Reported on 09/09/2015     docusate sodium 100 MG capsule  Commonly known as:  COLACE  Take 1 capsule (100 mg total) by mouth 2 (two) times daily.     estradiol 0.1 MG/GM vaginal cream  Commonly known as:  ESTRACE  Apply 1/2 gram per vagina every night for 4 weeks, then apply two times a week     fluticasone 50  MCG/ACT nasal spray  Commonly known as:  FLONASE  Place 2 sprays into both nostrils daily.     ibuprofen 800 MG tablet  Commonly known as:  ADVIL,MOTRIN  Take 1 tablet (800 mg total) by mouth 3 (three) times daily.     metoprolol succinate 50 MG 24 hr tablet  Commonly known as:  TOPROL-XL  Take 1 tablet (50 mg total) by mouth daily.     naproxen 500 MG tablet  Commonly known as:  NAPROSYN  Take 1 tablet (500 mg total) by mouth 2 (two) times daily with a meal.     oxyCODONE-acetaminophen 5-325 MG tablet  Commonly known as:  PERCOCET/ROXICET  Take 1-2 tablets by mouth every 4 (four) hours as needed (moderate to severe pain (when tolerating fluids)).     QUEtiapine Fumarate 150 MG 24 hr tablet  Commonly known as:  SEROQUEL XR  Take 1 tablet (150 mg total) by mouth at bedtime.     traMADol 50 MG tablet  Commonly known as:  ULTRAM  Take 1 tablet (50 mg total) by mouth every 8 (eight) hours as needed.           Follow-up Information    Follow up with Brayton Mars, MD. Go in 1 week.   Specialties:  Obstetrics and Gynecology, Radiology   Why:  Post Op Check   Contact information:   [place]  [address] [place] [address] [phone]       Signed: Alanda Slim Kelechi Orgeron 10/20/2015, 12:10 PM  The only 0 to this dictation

## 2015-10-20 NOTE — Progress Notes (Signed)
D/C order from Dr. Enzo Bi.  Reviewed d/c instructions and prescriptions with patient and answered any questions.  Patient d/c home via wheelchair by auxillary.

## 2015-10-22 NOTE — Addendum Note (Signed)
Addendum  created 10/22/15 1032 by Lance Muss, CRNA   Modules edited: Anesthesia Events, Anesthesia Responsible Staff

## 2015-10-27 ENCOUNTER — Other Ambulatory Visit: Payer: Self-pay

## 2015-10-27 MED ORDER — TRAMADOL HCL 50 MG PO TABS: 50.0000 mg | ORAL_TABLET | Freq: Three times a day (TID) | ORAL | Status: DC | PRN

## 2015-10-27 NOTE — Telephone Encounter (Signed)
fax

## 2015-11-03 ENCOUNTER — Encounter: Payer: Self-pay | Admitting: Obstetrics and Gynecology

## 2015-11-03 ENCOUNTER — Ambulatory Visit (INDEPENDENT_AMBULATORY_CARE_PROVIDER_SITE_OTHER): Payer: PRIVATE HEALTH INSURANCE | Admitting: Obstetrics and Gynecology

## 2015-11-03 VITALS — BP 128/80 | HR 78 | Ht 64.0 in | Wt 125.4 lb

## 2015-11-03 DIAGNOSIS — Z09 Encounter for follow-up examination after completed treatment for conditions other than malignant neoplasm: Secondary | ICD-10-CM

## 2015-11-03 DIAGNOSIS — F4321 Adjustment disorder with depressed mood: Secondary | ICD-10-CM | POA: Diagnosis not present

## 2015-11-03 DIAGNOSIS — F4320 Adjustment disorder, unspecified: Secondary | ICD-10-CM

## 2015-11-03 MED ORDER — LORAZEPAM 1 MG PO TABS: 1.0000 mg | ORAL_TABLET | Freq: Two times a day (BID) | ORAL | Status: DC

## 2015-11-03 NOTE — Progress Notes (Signed)
GYN ENCOUNTER NOTE  Subjective:       Monique Tucker is a 55 y.o. G100P2002 female is here for gynecologic evaluation of the following issues:  1. 2 week S/P TVH with BSO. Patient doing well from a post-operative standpoint. Having some light spotting with faint odor. Patient also complains of lower abdominal pain that is 3/10 in severity and worse with coughing. Taking motrin for pain with moderate relief. Patient thinks pain may be due to stress and inability to rest after loss of sister 1 week ago. Denies any change in vasomotor sxs, night sweats, fever, n/v. 2. 2 week S/P anterior colporrhaphy. Still feels like she may have some incomplete emptying. Denies any dysuria or gross hematuria. 3. Recent, unexpected loss of sister 1 week ago.   Gynecologic History No LMP recorded (lmp unknown). Patient has had a hysterectomy. Contraception: status post hysterectomy Last mammogram: 12/02/2014. Results were:Right breast normal, further evaluation needed of left breast. Diagnostic mammo done on left breast 12/15/2014, and no evidence of malignancy found.  Obstetric History OB History  Gravida Para Term Preterm AB SAB TAB Ectopic Multiple Living  2 2 2       2     # Outcome Date GA Lbr Len/2nd Weight Sex Delivery Anes PTL Lv  2 Term 1981   5 lb 12.8 oz (2.631 kg) F Vag-Spont   Y  1 Term 1979   4 lb 2.4 oz (1.882 kg) F Vag-Spont   Y      Past Medical History  Diagnosis Date  . Depression   . Acute hepatitis C   . Hypertension   . AR (allergic rhinitis)   . Bulging lumbar disc   . COPD (chronic obstructive pulmonary disease) [facility])     Past Surgical History  Procedure Laterality Date  . Tubal ligation    . Vaginal hysterectomy Bilateral 10/19/2015    Procedure: HYSTERECTOMY VAGINAL WITH BILATERAL SALPINGO-OOPHERECTOMY;  Surgeon: Brayton Mars, MD;  Location: [facility] ORS;  Service: Gynecology;  Laterality: Bilateral;  . Cystocele repair N/A 10/19/2015    Procedure: ANTERIOR REPAIR  (CYSTOCELE);  Surgeon: Brayton Mars, MD;  Location: [facility] ORS;  Service: Gynecology;  Laterality: N/A;  . Vaginal hysterectomy      Current Outpatient Prescriptions on File Prior to Visit  Medication Sig Dispense Refill  . budesonide-formoterol (SYMBICORT) 160-4.5 MCG/ACT inhaler Inhale 2 puffs into the lungs 2 (two) times daily. 3 Inhaler 2  . cetirizine-pseudoephedrine (ZYRTEC-D) 5-120 MG per tablet Take 1 tablet by mouth daily as needed (takes in the fall mostly). Reported on 09/09/2015    . estradiol (ESTRACE) 0.1 MG/GM vaginal cream Apply 1/2 gram per vagina every night for 4 weeks, then apply two times a week 30 g 12  . fluticasone (FLONASE) 50 MCG/ACT nasal spray Place 2 sprays into both nostrils daily. 1 g 12  . ibuprofen (ADVIL,MOTRIN) 800 MG tablet Take 1 tablet (800 mg total) by mouth 3 (three) times daily. 30 tablet 1  . metoprolol succinate (TOPROL-XL) 50 MG 24 hr tablet Take 1 tablet (50 mg total) by mouth daily. 90 tablet 2  . naproxen (NAPROSYN) 500 MG tablet Take 1 tablet (500 mg total) by mouth 2 (two) times daily with a meal. 180 tablet 1  . QUEtiapine Fumarate (SEROQUEL XR) 150 MG 24 hr tablet Take 1 tablet (150 mg total) by mouth at bedtime. 90 tablet 2  . traMADol (ULTRAM) 50 MG tablet Take 1 tablet (50 mg total) by mouth every 8 (eight)  hours as needed. 30 tablet 1   No current facility-administered medications on file prior to visit.    Allergies  Allergen Reactions  . Bee Venom Anaphylaxis  . Seasonal Ic [Cholestatin] Other (See Comments)    Runny nose and itching eyes SEASONAL ALLEGIES    Social History   Social History  . Marital Status: Single    Spouse Name: N/A  . Number of Children: N/A  . Years of Education: N/A   Occupational History  . Not on file.   Social History Main Topics  . Smoking status: Current Every Day Smoker -- 1.00 packs/day    Types: Cigarettes  . Smokeless tobacco: Never Used  . Alcohol Use: 7.2 oz/week    12 Cans of  beer per week     Comment: weekly, last drink saturday 4/22  . Drug Use: No  . Sexual Activity: No   Other Topics Concern  . Not on file   Social History Narrative    Family History  Problem Relation Age of Onset  . Breast cancer Mother   . Pancreatic cancer Mother   . Cancer Mother     breast and pancreatic  . Hypertension Mother   . Migraines Mother   . Heart disease Father   . Mental illness Sister     depression  . Alcohol abuse Maternal Grandfather   . Cancer Maternal Grandmother     lung and brain  . Mental illness Sister     depression    The following portions of the patient's history were reviewed and updated as appropriate: allergies, current medications, past family history, past medical history, past social history, past surgical history and problem list.  Review of Systems Review of Systems - General ROS: negative for - chills, fatigue, fever, malaise or night sweats Hematological and Lymphatic ROS: negative for - bleeding problems or swollen lymph nodes Gastrointestinal ROS: negative for - blood in stools, change in bowel habits and nausea/vomiting Musculoskeletal ROS: negative for - joint pain, muscle pain or muscular weakness Genito-Urinary ROS: negative for - dysuria,genital ulcers, hematuria, incontinence  Objective:   BP 128/80 mmHg  Pulse 78  Ht 5\' 4"  (1.626 m)  Wt 125 lb 6.4 oz (56.881 kg)  BMI 21.51 kg/m2  LMP  (LMP Unknown) CONSTITUTIONAL: Well-developed, well-nourished female in no acute distress.  HENT:  Normocephalic, atraumatic.  NECK: Normal range of motion, supple, no masses.  Normal thyroid.  SKIN: Skin is warm and dry. No rash noted. Not diaphoretic. No erythema. No pallor. [place]: Alert and oriented to person, place, and time.  PSYCHIATRIC: Depressed mood and flat affect. Normal behavior. Normal judgment and thought content. CARDIOVASCULAR:Not Examined RESPIRATORY: Not Examined BREASTS: Not Examined ABDOMEN: Not  Examined PELVIC: Not Examined MUSCULOSKELETAL: Normal range of motion. No tenderness.    Assessment:   1. Grief reaction - Unexpected death of sister 1 week ago  2. Postop check - 3/10 pelvic pain, relieved with motrin - Light spotting and odor - Still feeling like there is some incomplete emptying. No dysuria, or gross hematuria. - No signs of post-op infection     Plan:   1. Short term Ativan for anxiety and sleep-related issues due to recent loss of sister. Discussed that short-term antidepressants could be prescribed if patient felt like she needed something. 2. Follow up in 4 weeks for final post-op check and exam.  3. Resume activity as tolerated.  A total of 15 minutes were spent face-to-face with the patient during this encounter and  over half of that time dealt with counseling and coordination of care.   Claiborne Billings Rayburn PA-S Brayton Mars, MD   I have seen, interviewed, and examined the patient in conjunction with the [email or web address]. student and affirm the diagnosis and management plan. Tejal Monroy A. Tieshia Rettinger, MD, FACOG   Note: This dictation was prepared with Dragon dictation along with smaller phrase technology. Any transcriptional errors that result from this process are unintentional.

## 2015-11-03 NOTE — Patient Instructions (Signed)
1. Return in 4 weeks for final postop check 2. Ativan 1 mg twice a day as needed

## 2015-11-25 ENCOUNTER — Telehealth: Payer: Self-pay | Admitting: Obstetrics and Gynecology

## 2015-11-25 NOTE — Telephone Encounter (Signed)
Monique Tucker called saying she's been out of Ativan for a few days and her hands are shaking and she needs a refill. She said Dr. Enzo Bi told her if

## 2015-11-25 NOTE — Telephone Encounter (Signed)
Monique Tucker called saying she needs a refill of Ativan sent to her pharmacy. She's been out for a few days. She's still struggling with the death of her sister to the point that her hands are shaking. She said Dr. Enzo Bi told her he'd give her a refill if she needed it. Please give her a call if needed.  Pt's ph# [phone] Thank you.

## 2015-12-01 MED ORDER — LORAZEPAM 1 MG PO TABS: 1.0000 mg | ORAL_TABLET | Freq: Two times a day (BID) | ORAL | Status: DC

## 2015-12-01 NOTE — Telephone Encounter (Signed)
Pt aware rx is ready for p/u. Pt has an appt for tomorrow. She will get rx then.

## 2015-12-02 ENCOUNTER — Encounter: Payer: Self-pay | Admitting: Obstetrics and Gynecology

## 2015-12-02 ENCOUNTER — Ambulatory Visit (INDEPENDENT_AMBULATORY_CARE_PROVIDER_SITE_OTHER): Payer: PRIVATE HEALTH INSURANCE | Admitting: Obstetrics and Gynecology

## 2015-12-02 ENCOUNTER — Telehealth: Payer: Self-pay

## 2015-12-02 VITALS — BP 163/80 | HR 67 | Ht 64.0 in | Wt 120.4 lb

## 2015-12-02 DIAGNOSIS — F4321 Adjustment disorder with depressed mood: Secondary | ICD-10-CM

## 2015-12-02 DIAGNOSIS — Z90710 Acquired absence of both cervix and uterus: Secondary | ICD-10-CM

## 2015-12-02 DIAGNOSIS — Z09 Encounter for follow-up examination after completed treatment for conditions other than malignant neoplasm: Secondary | ICD-10-CM

## 2015-12-02 MED ORDER — EPINEPHRINE 0.3 MG/0.3ML IJ SOAJ: 0.3000 mg | Freq: Once | INTRAMUSCULAR | Status: DC

## 2015-12-02 NOTE — Progress Notes (Signed)
CHIEF complaint:  1. Final postop check 2. Status post TVH BSO with anterior colporrhaphy and Kelly plication 3.  Grief reaction with loss of Sister  Patient presents for her 6 week postop check.  Physically she is doing well with normal bowel and bladder function.  She is not leaking any urine.  She is not experiencing any pelvic pain, vaginal bleeding, or vaginal discharge.  Patient is still having difficulty coping with the loss of her sister and has asked for Ativan prescription refill  Past medical history, past surgical history, problems, medications, and allergies are reviewed.  OBJECTIVE: BP 163/80 mmHg  Pulse 67  Ht 5\' 4"  (1.626 m)  Wt 120 lb 6.4 oz (54.613 kg)  BMI 20.66 kg/m2  LMP  (LMP Unknown) Pleasant white female in no acute distress. Affect is appropriate. Abdomen: Soft, nontender, without organomegaly. Pelvic exam: External genitalia normal BUS-normal. Non-well-healed incision line; good vault support; vaginal cuff intact. Cervix-surgically absent Uterus-surgically absent. Bimanual exam reveals normal vaginal cuff without significant mass or tenderness. External rectal exam-normal.  ASSESSMENT: 1.  Normal 6 week postoperative check, status post TVH, BSO with anterior colporrhaphy for pelvic organ prolapse. 2.  Grief reaction, continues.  PLAN: 1.  Resume activities of daily living without restrictions. 2.  Continue with Estrace cream 1/2 g intravaginally twice a week. 3.  Return in 1 year for follow-up. 4.  Ativan prescription has been refilled.  Brayton Mars, MD  Note: This dictation was prepared with Dragon dictation along with smaller phrase technology. Any transcriptional errors that result from this process are unintentional.

## 2015-12-02 NOTE — Telephone Encounter (Signed)
Pharmacy requesting Rx for Epipen auto-injector 0.3 inj kit #2

## 2015-12-02 NOTE — Patient Instructions (Signed)
1. Resume all activities without restriction 2. Continue using Estrace cream intravaginally once or twice a week 3. Return in 1 year for follow-up

## 2015-12-18 ENCOUNTER — Ambulatory Visit (INDEPENDENT_AMBULATORY_CARE_PROVIDER_SITE_OTHER): Payer: PRIVATE HEALTH INSURANCE | Admitting: Unknown Physician Specialty

## 2015-12-18 ENCOUNTER — Encounter: Payer: Self-pay | Admitting: Unknown Physician Specialty

## 2015-12-18 VITALS — BP 186/93 | HR 71 | Temp 98.0°F | Ht 63.7 in | Wt 125.0 lb

## 2015-12-18 DIAGNOSIS — M25532 Pain in left wrist: Secondary | ICD-10-CM | POA: Diagnosis not present

## 2015-12-18 DIAGNOSIS — M722 Plantar fascial fibromatosis: Secondary | ICD-10-CM

## 2015-12-18 DIAGNOSIS — M25531 Pain in right wrist: Secondary | ICD-10-CM | POA: Diagnosis not present

## 2015-12-18 DIAGNOSIS — M79609 Pain in unspecified limb: Secondary | ICD-10-CM | POA: Insufficient documentation

## 2015-12-18 DIAGNOSIS — I10 Essential (primary) hypertension: Secondary | ICD-10-CM | POA: Diagnosis not present

## 2015-12-18 DIAGNOSIS — M79606 Pain in leg, unspecified: Secondary | ICD-10-CM

## 2015-12-18 MED ORDER — TRAMADOL HCL 50 MG PO TABS: 50.0000 mg | ORAL_TABLET | Freq: Three times a day (TID) | ORAL | Status: DC | PRN

## 2015-12-18 NOTE — Progress Notes (Signed)
BP 186/93 mmHg  Pulse 71  Temp(Src) 98 F (36.7 C)  Ht 5' 3.7" (1.618 m)  Wt 125 lb (56.7 kg)  BMI 21.66 kg/m2  SpO2 97%  LMP  (LMP Unknown)   Subjective:    Patient ID: Monique Tucker, female    DOB: 05-11-1961, 55 y.o.   MRN: [id number]  HPI: Monique Tucker is a 55 y.o. female  Chief Complaint  Patient presents with  . Pain    pt states she has pain all over but mainly in legs, states she has had this all of her life    Pt states she has very painful legs that she came in today in tears and states "I can't take it anymore."  States the only thing that helps is Tramadol which is given by Dr. Jeananne Rama.  States her calves cramp and it "throbs" in the back of her knees.  No pain in thighs.  Heels and arches hurt.  She states she has pain that comes and goes wrist and lower arms that comes ant goes.  Right hip aches.  She stands a lot at work and was out of work for a hysterectomy but she states she is "right back where I started.    States legs are worse with standing and improve when she sits down.     Relevant past medical, surgical, family and social history reviewed and updated as indicated. Interim medical history since our last visit reviewed. Allergies and medications reviewed and updated.  Review of Systems  Per HPI unless specifically indicated above     Objective:    BP 186/93 mmHg  Pulse 71  Temp(Src) 98 F (36.7 C)  Ht 5' 3.7" (1.618 m)  Wt 125 lb (56.7 kg)  BMI 21.66 kg/m2  SpO2 97%  LMP  (LMP Unknown)  Wt Readings from Last 3 Encounters:  12/18/15 125 lb (56.7 kg)  12/02/15 120 lb 6.4 oz (54.613 kg)  11/03/15 125 lb 6.4 oz (56.881 kg)    Physical Exam  Constitutional: She is oriented to person, place, and time. She appears well-developed and well-nourished. No distress.  HENT:  Head: Normocephalic and atraumatic.  Eyes: Conjunctivae and lids are normal. Right eye exhibits no discharge. Left eye exhibits no discharge. No scleral icterus.  Neck:  Normal range of motion. Neck supple. No JVD present. Carotid bruit is not present.  Cardiovascular: Normal rate, regular rhythm and normal heart sounds.   Pulmonary/Chest: Effort normal and breath sounds normal.  Abdominal: Normal appearance. There is no splenomegaly or hepatomegaly.  Musculoskeletal: Normal range of motion.  Pt tender in several Fibromyalgia pressure points in neck and legs.    Neurological: She is alert and oriented to person, place, and time.  Skin: Skin is warm, dry and intact. No rash noted. No pallor.  Psychiatric: She has a normal mood and affect. Her behavior is normal. Judgment and thought content normal.     Assessment & Plan:   Problem List Items Addressed This Visit      Unprioritized   Hypertension    Pt states it is elevated because she is in a lot of pain      Relevant Orders   Comprehensive metabolic panel   TSH   Pain in both wrists   Relevant Orders   TSH   VITAMIN D 25 Hydroxy (Vit-D Deficiency, Fractures)   Rheumatoid Factor   ANA w/Reflex   Pain in limb   Relevant Orders   Ambulatory referral to  Podiatry   Ambulatory referral to Vascular Surgery   CBC with Differential/Platelet   Comprehensive metabolic panel   TSH   VITAMIN D 25 Hydroxy (Vit-D Deficiency, Fractures)   Rheumatoid Factor   ANA w/Reflex   Plantar fasciitis - Primary   Relevant Orders   Ambulatory referral to Podiatry      Refer to podiatry.  I feel treatment with plantar fasciitis will also help lower leg pain.   R/O claudication and refer to vascular ? Fibromyalgia. Increase Tramadol temporarily    Follow up plan: Return in about 3 weeks (around 01/08/2016).

## 2015-12-18 NOTE — Assessment & Plan Note (Addendum)
Pt states it is elevated because she is in a lot of pain

## 2015-12-19 LAB — COMPREHENSIVE METABOLIC PANEL
ALBUMIN: 4.3 g/dL (ref 3.5–5.5)
ALK PHOS: 109 IU/L (ref 39–117)
ALT: 49 IU/L — ABNORMAL HIGH (ref 0–32)
AST: 49 IU/L — ABNORMAL HIGH (ref 0–40)
Albumin/Globulin Ratio: 1.6 (ref 1.2–2.2)
BUN / CREAT RATIO: 14 (ref 9–23)
BUN: 11 mg/dL (ref 6–24)
Bilirubin Total: 0.2 mg/dL (ref 0.0–1.2)
CALCIUM: 9.4 mg/dL (ref 8.7–10.2)
CHLORIDE: 102 mmol/L (ref 96–106)
CO2: 24 mmol/L (ref 18–29)
CREATININE: 0.8 mg/dL (ref 0.57–1.00)
GFR calc Af Amer: 96 mL/min/{1.73_m2} (ref >59)
GFR, EST NON AFRICAN AMERICAN: 83 mL/min/{1.73_m2} (ref >59)
GLOBULIN, TOTAL: 2.7 g/dL (ref 1.5–4.5)
GLUCOSE: 106 mg/dL — AB (ref 65–99)
Potassium: 4.6 mmol/L (ref 3.5–5.2)
Sodium: 144 mmol/L (ref 134–144)
TOTAL PROTEIN: 7 g/dL (ref 6.0–8.5)

## 2015-12-19 LAB — TSH: TSH: 2.98 u[IU]/mL (ref 0.450–4.500)

## 2015-12-19 LAB — VITAMIN D 25 HYDROXY (VIT D DEFICIENCY, FRACTURES): Vit D, 25-Hydroxy: 32.2 ng/mL (ref 30.0–100.0)

## 2015-12-19 LAB — CBC WITH DIFFERENTIAL/PLATELET
BASOS ABS: 0 10*3/uL (ref 0.0–0.2)
Basos: 0 %
EOS (ABSOLUTE): 0.2 10*3/uL (ref 0.0–0.4)
EOS: 4 %
HEMATOCRIT: 43.3 % (ref 34.0–46.6)
HEMOGLOBIN: 14.6 g/dL (ref 11.1–15.9)
IMMATURE GRANS (ABS): 0 10*3/uL (ref 0.0–0.1)
IMMATURE GRANULOCYTES: 0 %
LYMPHS: 47 %
Lymphocytes Absolute: 2.1 10*3/uL (ref 0.7–3.1)
MCH: 33.6 pg — ABNORMAL HIGH (ref 26.6–33.0)
MCHC: 33.7 g/dL (ref 31.5–35.7)
MCV: 100 fL — ABNORMAL HIGH (ref 79–97)
MONOCYTES: 9 %
Monocytes Absolute: 0.4 10*3/uL (ref 0.1–0.9)
NEUTROS PCT: 40 %
Neutrophils Absolute: 1.8 10*3/uL (ref 1.4–7.0)
Platelets: 170 10*3/uL (ref 150–379)
RBC: 4.34 x10E6/uL (ref 3.77–5.28)
RDW: 14 % (ref 12.3–15.4)
WBC: 4.4 10*3/uL (ref 3.4–10.8)

## 2015-12-19 LAB — ANA W/REFLEX: ANA: NEGATIVE

## 2015-12-19 LAB — RHEUMATOID FACTOR: Rhuematoid fact SerPl-aCnc: <10 IU/mL (ref 0.0–13.9)

## 2015-12-21 ENCOUNTER — Telehealth: Payer: Self-pay | Admitting: Unknown Physician Specialty

## 2015-12-21 DIAGNOSIS — D7589 Other specified diseases of blood and blood-forming organs: Secondary | ICD-10-CM | POA: Insufficient documentation

## 2015-12-21 NOTE — Telephone Encounter (Signed)
Left message to check B12

## 2016-01-05 ENCOUNTER — Ambulatory Visit (INDEPENDENT_AMBULATORY_CARE_PROVIDER_SITE_OTHER): Payer: PRIVATE HEALTH INSURANCE | Admitting: Podiatry

## 2016-01-05 ENCOUNTER — Encounter: Payer: Self-pay | Admitting: Podiatry

## 2016-01-05 ENCOUNTER — Ambulatory Visit (INDEPENDENT_AMBULATORY_CARE_PROVIDER_SITE_OTHER): Payer: PRIVATE HEALTH INSURANCE

## 2016-01-05 VITALS — BP 145/76 | HR 64 | Resp 12

## 2016-01-05 DIAGNOSIS — M722 Plantar fascial fibromatosis: Secondary | ICD-10-CM | POA: Diagnosis not present

## 2016-01-05 DIAGNOSIS — M79672 Pain in left foot: Secondary | ICD-10-CM

## 2016-01-05 DIAGNOSIS — M79671 Pain in right foot: Secondary | ICD-10-CM

## 2016-01-05 MED ORDER — MELOXICAM 15 MG PO TABS: 15.0000 mg | ORAL_TABLET | Freq: Every day | ORAL | Status: DC

## 2016-01-05 NOTE — Progress Notes (Addendum)
   Subjective:    Patient ID: Monique Tucker, female    DOB: 1961-01-23, 55 y.o.   MRN: [id number]  HPI Chief Complaint  Patient presents with  . Plantar Fasciitis    B/L HEELS AND ARCH BEEN PAINFUL FOR 3+ YEARS. FEET ARE WORSE WHEN WALKING/STANDING. TRIED SOAK WITH EPSOMA SALT-NO RELIEF.   55 year old female presents the office today for concerns of bilateral heel arch pain which is ongoing about 3 years. She states that she has pain when she first gets up and also with walking and standing for prolonged periods of time. She is tried soaking is also no relief as well as changing her shoes with minimal relief. She denies any numbness or tingling. Pain does not wake her up at night. No swelling or redness. No claudication symptoms. No other complaints at this time.   Review of Systems  Musculoskeletal: Positive for gait problem.       Objective:   Physical Exam General: AAO x3, NAD  Dermatological: Skin is warm, dry and supple bilateral. Nails x 10 are well manicured; remaining integument appears unremarkable at this time. There are no open sores, no preulcerative lesions, no rash or signs of infection present.  Vascular: Dorsalis Pedis artery and Posterior Tibial artery pedal pulses are 2/4 bilateral with immedate capillary fill time. Pedal hair growth present. No varicosities and no lower extremity edema present bilateral. There is no pain with calf compression, swelling, warmth, erythema.   Neruologic: Grossly intact via light touch bilateral. Vibratory intact via tuning fork bilateral. Protective threshold with Semmes Wienstein monofilament intact to all pedal sites. Negative tinel sign b/l.   Musculoskeletal: Tenderness to palpation along the plantar medial tubercle of the calcaneus at the insertion of plantar fascia on the left and right foot. There is no pain along the course of the plantar fascia within the arch of the foot. Plantar fascia appears to be intact. There is no pain  with lateral compression of the calcaneus or pain with vibratory sensation. There is no pain along the course or insertion of the achilles tendon. No other areas of tenderness to bilateral lower extremities. MMT 5/5, ROM WNL.    Gait: Unassisted, Nonantalgic.     Assessment & Plan:  55 year old female with bilateral heel pain, plantar fasciitis and biomechanical changes -Treatment options discussed including all alternatives, risks, and complications -Etiology of symptoms were discussed -X-rays were obtained and reviewed with the patient. No evidence of acute fracture or stress fracture. -Patient elects to proceed with steroid injection into the left and right heel. Under sterile skin preparation, a total of 2.5cc of kenalog 10, 0.5% Marcaine plain, and 2% lidocaine plain were infiltrated into the symptomatic area without complication. A band-aid was applied. Patient tolerated the injection well without complication. Post-injection care with discussed with the patient. Discussed with the patient to ice the area over the next couple of days to help prevent a steroid flare.  -Plantar fascial braces bilaterally -Voltaren -Stretching and icing daily -Discussed shoe gear modifications and orthotics -Follow-up in 3 weeks or sooner if any problems arise. In the meantime, encouraged to call the office with any questions, concerns, change in symptoms.   Celesta Gentile, DPM  *Addendum 01/12/16- orthotics ordered

## 2016-01-05 NOTE — Patient Instructions (Signed)

## 2016-01-11 ENCOUNTER — Ambulatory Visit: Payer: PRIVATE HEALTH INSURANCE | Admitting: Unknown Physician Specialty

## 2016-01-26 ENCOUNTER — Encounter: Payer: Self-pay | Admitting: Podiatry

## 2016-01-26 ENCOUNTER — Telehealth: Payer: Self-pay

## 2016-01-26 ENCOUNTER — Other Ambulatory Visit: Payer: PRIVATE HEALTH INSURANCE

## 2016-01-26 ENCOUNTER — Ambulatory Visit (INDEPENDENT_AMBULATORY_CARE_PROVIDER_SITE_OTHER): Payer: PRIVATE HEALTH INSURANCE | Admitting: Podiatry

## 2016-01-26 DIAGNOSIS — M722 Plantar fascial fibromatosis: Secondary | ICD-10-CM | POA: Diagnosis not present

## 2016-01-26 DIAGNOSIS — E538 Deficiency of other specified B group vitamins: Secondary | ICD-10-CM

## 2016-01-26 NOTE — Telephone Encounter (Signed)
Called and left patient a voicemail letting her know that Malachy Mood wants her to come in for b12 lab. I explained to her that we just need the lab and that she would not see me or Malachy Mood. I asked for the patient to call us if she has any questions or concerns.

## 2016-01-26 NOTE — Telephone Encounter (Signed)
-----   Message from Kathrine Haddock, NP sent at 01/26/2016 10:09 AM EDT ----- This pt was supposed to come in for a Vit B12 level

## 2016-01-27 ENCOUNTER — Encounter: Payer: Self-pay | Admitting: Unknown Physician Specialty

## 2016-01-27 LAB — VITAMIN B12: Vitamin B-12: 665 pg/mL (ref 211–946)

## 2016-01-29 NOTE — Progress Notes (Signed)
Subjective: 55 year old female presents the office today follow-up evaluation of bilateral heel pain. She has not noticed much of a difference with the injection or anti-inflammatories. She can get pain about her heel is mostly standing and working all day but she does get pain in the morning still. This has been ongoing this point for over 3 years. Denies any systemic complaints such as fevers, chills, nausea, vomiting. No acute changes since last appointment, and no other complaints at this time.   Objective: AAO x3, NAD DP/PT pulses palpable bilaterally, CRT less than 3 seconds Negative Tinel sign bilaterally Tenderness to palpation along the plantar medial tubercle of the calcaneus at the insertion of plantar fascia on the left and right foot. There is no pain along the course of the plantar fascia within the arch of the foot. Plantar fascia appears to be intact. There is no pain with lateral compression of the calcaneus or pain with vibratory sensation. There is no pain along the course or insertion of the achilles tendon. No other areas of tenderness to bilateral lower extremities. Equinus is present. No edema, erythema, increase in warmth to bilateral lower extremities.  No open lesions or pre-ulcerative lesions.  No pain with calf compression, swelling, warmth, erythema  Assessment: 55 year old female bilateral heel Pain likely plantar fasciitis  Plan: -All treatment options discussed with the patient including all alternatives, risks, complications.  -we'll hold off on the second injection as the first did not help. -Orthotics were dispensed today. Oral  Written break instructions were discussed -Continue stretching exercises daily. -continue supportive shoe gear discussed shoe gear changes. -Follow-up as scheduled or sooner if any problems arise. In the meantime, encouraged to call the office with any questions, concerns, change in symptoms.  -Patient encouraged to call the office  with any questions, concerns, change in symptoms.   Celesta Gentile, DPM

## 2016-02-17 ENCOUNTER — Other Ambulatory Visit: Payer: Self-pay

## 2016-02-17 MED ORDER — METOPROLOL SUCCINATE ER 50 MG PO TB24: 50.0000 mg | ORAL_TABLET | Freq: Every day | ORAL | 2 refills | Status: DC

## 2016-02-23 ENCOUNTER — Ambulatory Visit (INDEPENDENT_AMBULATORY_CARE_PROVIDER_SITE_OTHER): Payer: PRIVATE HEALTH INSURANCE | Admitting: Podiatry

## 2016-02-23 ENCOUNTER — Encounter: Payer: Self-pay | Admitting: Podiatry

## 2016-02-23 DIAGNOSIS — M722 Plantar fascial fibromatosis: Secondary | ICD-10-CM

## 2016-02-23 DIAGNOSIS — L600 Ingrowing nail: Secondary | ICD-10-CM | POA: Diagnosis not present

## 2016-02-23 NOTE — Patient Instructions (Signed)

## 2016-02-24 NOTE — Progress Notes (Signed)
Subjective: 55 year old female presents the office they for follow-up with vibration bilateral heel pain. She states that since wearing the orthotics or heel pain is substantially improved today. Today her main concern is she is having ingrown toenails to both of her big toes on the medial nail borders the left side worse than the right. She states that she had a pedicure and a try to trim them out but since then she's had increased pain to the area. She's had a change her shoes because of the painful toenails. Denies any drainage or redness around the areas. No other treatment. Denies any systemic complaints such as fevers, chills, nausea, vomiting. No acute changes since last appointment, and no other complaints at this time.   Objective: AAO x3, NAD DP/PT pulses palpable bilaterally, CRT less than 3 seconds At this time there is very minimal tenderness palpation on the plantar medial tubercle of the calcaneus at the insertion of the plantar fascia. Apparently our to the foot. No avail lateral compression. No overlying edema, erythema. There is incurvation along the medial nail border of bilateral hallux toenails with a left side worse than the right. There is localized edema nail border on the left side without any erythema or increase in warmth. No drainage or pus. No pain on the lateral nail borders of the other toenails. No edema, erythema, increase in warmth to bilateral lower extremities.  No open lesions or pre-ulcerative lesions.  No pain with calf compression, swelling, warmth, erythema  Assessment: Resolving plantar fasciitis, symptomatic ingrown toenail left greater than right  Plan: -All treatment options discussed with the patient including all alternatives, risks, complications.  -Regards the heel pain continue stretching, icing exercises daily as well as wearing supportive shoe gear. She is pointing to purchase new shoes. Continue orthotics. -At this time, the patient is requesting  partial nail removal with chemical matricectomy to the symptomatic portion of the nail on the left medial nail border. Risks and complications were discussed with the patient for which they understand and  verbally consent to the procedure. Under sterile conditions a total of 3 mL of a mixture of 2% lidocaine plain and 0.5% Marcaine plain was infiltrated in a hallux block fashion. Once anesthetized, the skin was prepped in sterile fashion. A tourniquet was then applied. Next the medial aspect of hallux nail border was then sharply excised making sure to remove the entire offending nail border. Once the nails were ensured to be removed area was debrided and the underlying skin was intact. There is no purulence identified in the procedure. Next phenol was then applied under standard conditions and copiously irrigated. Silvadene was applied. A dry sterile dressing was applied. After application of the dressing the tourniquet was removed and there is found to be an immediate capillary refill time to the digit. The patient tolerated the procedure well any complications. Post procedure instructions were discussed the patient for which he verbally understood. Follow-up in one week for nail check or sooner if any problems are to arise. Discussed signs/symptoms of infection and directed to call the office immediately should any occur or go directly to the emergency room. In the meantime, encouraged to call the office with any questions, concerns, changes symptoms. -SHE WOULD LIKE TO DO THE RIGHT HALLUX NEXT APPOINTMENT AND ONLY WANTED TO DO ONE AT A TIME. -Patient encouraged to call the office with any questions, concerns, change in symptoms.   Celesta Gentile, DPM

## 2016-03-03 ENCOUNTER — Ambulatory Visit (INDEPENDENT_AMBULATORY_CARE_PROVIDER_SITE_OTHER): Payer: PRIVATE HEALTH INSURANCE | Admitting: Podiatry

## 2016-03-03 ENCOUNTER — Encounter: Payer: Self-pay | Admitting: Podiatry

## 2016-03-03 DIAGNOSIS — M722 Plantar fascial fibromatosis: Secondary | ICD-10-CM | POA: Diagnosis not present

## 2016-03-03 DIAGNOSIS — L600 Ingrowing nail: Secondary | ICD-10-CM

## 2016-03-03 NOTE — Patient Instructions (Signed)

## 2016-03-10 ENCOUNTER — Ambulatory Visit (INDEPENDENT_AMBULATORY_CARE_PROVIDER_SITE_OTHER): Payer: PRIVATE HEALTH INSURANCE | Admitting: Podiatry

## 2016-03-10 ENCOUNTER — Encounter: Payer: Self-pay | Admitting: Podiatry

## 2016-03-10 DIAGNOSIS — M722 Plantar fascial fibromatosis: Secondary | ICD-10-CM

## 2016-03-10 DIAGNOSIS — L600 Ingrowing nail: Secondary | ICD-10-CM

## 2016-03-10 NOTE — Progress Notes (Signed)
Subjective: Continue female presents the office if up with vibration left hallux partial nail avulsion which is doing well. Denies any drainage or redness or any pain. She's been soaking in Epson salts, Neosporin and a Band-Aid. Should proceed with having the right medial hallux ingrown toenail removed at this time. She says the nail border continues to be painfulwith pressure in shoes denies any redness or drainage. She is also still getting some mild heel pain mostly in the morning and she first gets up and she feels tightness in her heels. No swelling or redness. No numbness or tingling. She has changed her shoes as well as she's been wearing the inserts which have been helping. Denies any systemic complaints such as fevers, chills, nausea, vomiting. No acute changes since last appointment, and no other complaints at this time.   Objective: AAO x3, NAD DP/PT pulses palpable bilaterally, CRT less than 3 seconds On the left medial hallux the procedure site is healing well the scab [place] want. There is no tenderness. There is no drainage or redness or any swelling. No malodor. There is continued tenderness along the right medial hallux ingrown toenail and there is localized edema to this area any erythema or increase in warmth. No drainage or pus. No ascending synovitis. No malodor. No fluctuance or crepitus.  There is mild to greatly improved tenderness to the palpation tall plantar medial tubercle of the calcaneus at insertion of plantar fascial bilaterally. Equinus continues. Upon dorsiflexion ankle there is tightness to the plantar fasciitis as well as the Achilles tendon.  No edema, erythema, increase in warmth to bilateral lower extremities.  No open lesions or pre-ulcerative lesions.  No pain with calf compression, swelling, warmth, erythema  Assessment: Left status post partial nail avulsion healing well; right medial hallux into medical ingrown toenail; plantar fasciitis which is resolving.    Plan: -All treatment options discussed with the patient including all alternatives, risks, complications.  -Continue soaking in Epson salts, Neosporin and a Band-Aid on the day because of the area uncovered to the left side. -At this time, the patient is requesting partial nail removal with chemical matricectomy to the symptomatic portion of the nail. Risks and complications were discussed with the patient for which they understand and  verbally consent to the procedure. Under sterile conditions a total of 3 mL of a mixture of 2% lidocaine plain and 0.5% Marcaine plain was infiltrated in a hallux block fashion. Once anesthetized, the skin was prepped in sterile fashion. A tourniquet was then applied. Next the right medial aspect of hallux nail border was then sharply excised making sure to remove the entire offending nail border. Once the nails were ensured to be removed area was debrided and the underlying skin was intact. There is no purulence identified in the procedure. Next phenol was then applied under standard conditions and copiously irrigated. Silvadene was applied. A dry sterile dressing was applied. After application of the dressing the tourniquet was removed and there is found to be an immediate capillary refill time to the digit. The patient tolerated the procedure well any complications. Post procedure instructions were discussed the patient for which he verbally understood. Follow-up in one week for nail check or sooner if any problems are to arise. Discussed signs/symptoms of infection and directed to call the office immediately should any occur or go directly to the emergency room. In the meantime, encouraged to call the office with any questions, concerns, changes symptoms. -Continue stretching otherwise is well supportive shoe gear  and orthotics. Dispensed night splint today. -Patient encouraged to call the office with any questions, concerns, change in symptoms.   Celesta Gentile,  DPM

## 2016-03-10 NOTE — Progress Notes (Signed)
Subjective: 55 year old female presents the office they for follow-up evaluation of bilateral hallux medial nail avulsions which are doing well. She's been soaking his outcome and antibiotic ointment and bandage. Denies any pain, drainage or any redness. She doesn't of both of her heels to continue to hurt. She feels that her orthotics are not fitting correctly and the arches starting in the heel causing pain. She has been stretching icing intermittently but she states that she is not consistent with stretching exercises at home. She has tried changing her shoes well. Denies any systemic complaints such as fevers, chills, nausea, vomiting. No acute changes since last appointment, and no other complaints at this time.   Objective: AAO x3, NAD DP/PT pulses palpable bilaterally, CRT less than 3 seconds Status post bilateral medial hallux partial nail avulsions with healing well with a scab overlying the area. There is no drainage or pus. There is no surrounding redness or drainage. No signs of infection.  Tenderness to palpation along the plantar medial tubercle of the calcaneus at the insertion of plantar fascia on the right > left foot. There is no pain along the course of the plantar fascia within the arch of the foot. Plantar fascia appears to be intact. There is no pain with lateral compression of the calcaneus or pain with vibratory sensation. There is no pain along the course or insertion of the achilles tendon. No other areas of tenderness to bilateral lower extremities. No edema, erythema, increase in warmth to bilateral lower extremities.  No open lesions or pre-ulcerative lesions.  No pain with calf compression, swelling, warmth, erythema  Assessment: Status post bilateral partial nail avulsion healing well, bilateral foot pain likely plantar fasciitis  Plan: -All treatment options discussed with the patient including all alternatives, risks, complications.  -Continue soaking in Epson salts,  and in about ointment and a bandage in the [place] to the area uncovered at night. Much for signs or symptoms of infection. Follow in 2 weeks if not healed. -I molded her feet today and resent her orthtoics back for modifications to increase the arch and see if it needs to be remade -Patient elects to proceed with steroid injection into the left and right heel. Under sterile skin preparation, a total of 2.5cc of kenalog 10, 0.5% Marcaine plain, and 2% lidocaine plain were infiltrated into the symptomatic area without complication. A band-aid was applied. Patient tolerated the injection well without complication. Post-injection care with discussed with the patient. Discussed with the patient to ice the area over the next couple of days to help prevent a steroid flare.  -Plantar fascial taping applied bilaterally -Rx PT -Stretching and icing daily -Continue supportive shoegear -Follow-up in 3 weeks or sooner if any problems arise. In the meantime, encouraged to call the office with any questions, concerns, change in symptoms.   Celesta Gentile, DPM   -Patient encouraged to call the office with any questions, concerns, change in symptoms.

## 2016-03-22 ENCOUNTER — Ambulatory Visit (INDEPENDENT_AMBULATORY_CARE_PROVIDER_SITE_OTHER): Payer: PRIVATE HEALTH INSURANCE | Admitting: Obstetrics and Gynecology

## 2016-03-22 ENCOUNTER — Encounter: Payer: Self-pay | Admitting: Obstetrics and Gynecology

## 2016-03-22 VITALS — BP 151/76 | HR 71 | Ht 64.0 in | Wt 122.5 lb

## 2016-03-22 DIAGNOSIS — N952 Postmenopausal atrophic vaginitis: Secondary | ICD-10-CM

## 2016-03-22 DIAGNOSIS — N816 Rectocele: Secondary | ICD-10-CM | POA: Diagnosis not present

## 2016-03-22 DIAGNOSIS — Z90710 Acquired absence of both cervix and uterus: Secondary | ICD-10-CM | POA: Diagnosis not present

## 2016-03-22 NOTE — Patient Instructions (Signed)
1. Use Estrace cream intravaginal every other night for 4 weeks 2. Return in 4 weeks for preop appointment 3. Schedule posterior colporrhaphy (rectocele repair)-5 weeks

## 2016-03-22 NOTE — Progress Notes (Signed)
Chief complaint: 1. Pelvic organ prolapse  Patient presents for follow-up after TVH with anterior colporrhaphy in April 2017 for pelvic organ prolapse. She states that she has noted recurrence of pelvic pressure symptoms and a bulge approximately 2 weeks ago. Bladder function is normal. Bowel function is normal. She just feels uncomfortable with this pressure in her vagina and it is creating difficulty with sitting.  Past Medical History:  Diagnosis Date  . Acute hepatitis C   . AR (allergic rhinitis)   . Bulging lumbar disc   . COPD (chronic obstructive pulmonary disease) ([place])   . Depression   . Hypertension   . Plantar fasciitis    Past Surgical History:  Procedure Laterality Date  . CYSTOCELE REPAIR N/A 10/19/2015   Procedure: ANTERIOR REPAIR (CYSTOCELE);  Surgeon: Brayton Mars, MD;  Location: [facility] ORS;  Service: Gynecology;  Laterality: N/A;  . TUBAL LIGATION    . VAGINAL HYSTERECTOMY Bilateral 10/19/2015   Procedure: HYSTERECTOMY VAGINAL WITH BILATERAL SALPINGO-OOPHERECTOMY;  Surgeon: Brayton Mars, MD;  Location: [facility] ORS;  Service: Gynecology;  Laterality: Bilateral;  . VAGINAL HYSTERECTOMY      OBJECTIVE: BP (!) 151/76   Pulse 71   Ht 5\' 4"  (1.626 m)   Wt 122 lb 8 oz (55.6 kg)   LMP  (LMP Unknown)   BMI 21.03 kg/m  Pleasant well-appearing female, moderately anxious and tearful Back: No CVA tenderness Abdomen: Soft, nontender without organomegaly Pelvic exam: External genitalia within normal BUS-normal Vagina-fair estrogen effect; first degree cystocele; moderate rectocele; questionable enterocele. Valsalva maneuver exacerbates posterior vaginal mucosa defect Bimanual-will masses rectovaginal-normal external exam  ASSESSMENT: 1. Symptomatic rectocele 2. Possible enterocele 3. First-degree cystocele 4. Vaginal atrophy 5. Status post TVH BSO with anterior colporrhaphy in April 2017  PLAN: 1. Estrace cream intravaginal every other day for 4  weeks 2. Schedule posterior colporrhaphy with possible enterocele ligation in 5 weeks 3. Return in 4 weeks for preoperative appointment  A total of 15 minutes were spent face-to-face with the patient during this encounter and over half of that time dealt with counseling and coordination of care.  Brayton Mars, MD  Note: This dictation was prepared with Dragon dictation along with smaller phrase technology. Any transcriptional errors that result from this process are unintentional.

## 2016-03-31 ENCOUNTER — Encounter: Payer: Self-pay | Admitting: Podiatry

## 2016-03-31 ENCOUNTER — Ambulatory Visit (INDEPENDENT_AMBULATORY_CARE_PROVIDER_SITE_OTHER): Payer: PRIVATE HEALTH INSURANCE | Admitting: Podiatry

## 2016-03-31 DIAGNOSIS — M722 Plantar fascial fibromatosis: Secondary | ICD-10-CM

## 2016-03-31 NOTE — Patient Instructions (Signed)

## 2016-04-02 NOTE — Progress Notes (Signed)
Patient presents a pickup orthotics. She was seen by Cranford Mon, CMA and orthotics were dispensed. She declined appointment with myself. Oral and written instructions were discussed. Follow-up in 4 weeks or sooner if needed. Call with questions or concerns in the meantime.  Celesta Gentile, DPM

## 2016-04-19 ENCOUNTER — Other Ambulatory Visit: Payer: Self-pay

## 2016-04-19 MED ORDER — QUETIAPINE FUMARATE ER 150 MG PO TB24: 150.0000 mg | ORAL_TABLET | Freq: Every day | ORAL | 2 refills | Status: DC

## 2016-04-19 NOTE — Telephone Encounter (Signed)
Routing to provider. No appointment on file. 

## 2016-04-20 ENCOUNTER — Encounter: Payer: Self-pay | Admitting: Podiatry

## 2016-04-26 ENCOUNTER — Telehealth: Payer: Self-pay | Admitting: *Deleted

## 2016-04-26 NOTE — Telephone Encounter (Signed)
Pt states she has questions concerning ingrown toenails. I spoke with pt and she states her toenails are still tender after the 01/2016 procedure, and she is using peroxide and it is not bubbling.  Pt denies redness, drainage, or swelling. I told her if she had a dry hard scab and no symptoms of infections the toes just may be sensitive for a little bit longer because they have to get use to not having the protection of the nail. Pt states understanding.

## 2016-04-27 ENCOUNTER — Encounter: Payer: Self-pay | Admitting: Obstetrics and Gynecology

## 2016-04-27 ENCOUNTER — Ambulatory Visit (INDEPENDENT_AMBULATORY_CARE_PROVIDER_SITE_OTHER): Payer: PRIVATE HEALTH INSURANCE | Admitting: Obstetrics and Gynecology

## 2016-04-27 ENCOUNTER — Encounter
Admission: RE | Admit: 2016-04-27 | Discharge: 2016-04-27 | Disposition: A | Discharge status: Home / Self Care | Payer: PRIVATE HEALTH INSURANCE | Source: Ambulatory Visit | Attending: Obstetrics and Gynecology | Admitting: Obstetrics and Gynecology

## 2016-04-27 VITALS — BP 124/78 | HR 65 | Ht 64.0 in | Wt 124.7 lb

## 2016-04-27 DIAGNOSIS — I10 Essential (primary) hypertension: Secondary | ICD-10-CM | POA: Diagnosis not present

## 2016-04-27 DIAGNOSIS — N816 Rectocele: Secondary | ICD-10-CM

## 2016-04-27 DIAGNOSIS — Z01818 Encounter for other preprocedural examination: Secondary | ICD-10-CM | POA: Insufficient documentation

## 2016-04-27 DIAGNOSIS — Z90710 Acquired absence of both cervix and uterus: Secondary | ICD-10-CM

## 2016-04-27 HISTORY — DX: Anxiety disorder, unspecified: F41.9

## 2016-04-27 HISTORY — DX: Unspecified osteoarthritis, unspecified site: M19.90

## 2016-04-27 HISTORY — DX: Restless legs syndrome: G25.81

## 2016-04-27 LAB — CBC WITH DIFFERENTIAL/PLATELET
BASOS ABS: 0 10*3/uL (ref 0–0.1)
Basophils Relative: 1 %
Eosinophils Absolute: 0.1 10*3/uL (ref 0–0.7)
Eosinophils Relative: 2 %
HCT: 42.2 % (ref 35.0–47.0)
HEMOGLOBIN: 14.9 g/dL (ref 12.0–16.0)
LYMPHS ABS: 1.7 10*3/uL (ref 1.0–3.6)
LYMPHS PCT: 33 %
MCH: 34.9 pg — AB (ref 26.0–34.0)
MCHC: 35.2 g/dL (ref 32.0–36.0)
MCV: 99.2 fL (ref 80.0–100.0)
Monocytes Absolute: 0.5 10*3/uL (ref 0.2–0.9)
Monocytes Relative: 9 %
NEUTROS ABS: 3 10*3/uL (ref 1.4–6.5)
NEUTROS PCT: 55 %
Platelets: 146 10*3/uL — ABNORMAL LOW (ref 150–440)
RBC: 4.26 MIL/uL (ref 3.80–5.20)
RDW: 13.7 % (ref 11.5–14.5)
WBC: 5.3 10*3/uL (ref 3.6–11.0)

## 2016-04-27 LAB — TYPE AND SCREEN
ABO/RH(D): AB POS
ANTIBODY SCREEN: NEGATIVE

## 2016-04-27 LAB — RAPID HIV SCREEN (HIV 1/2 AB+AG)
HIV 1/2 Antibodies: NONREACTIVE
HIV-1 P24 ANTIGEN - HIV24: NONREACTIVE

## 2016-04-27 NOTE — Progress Notes (Signed)
Subjective:   PREOPERATIVE HISTORY AND PHYSICAL    Date of surgery: 05/02/2016 Procedure: Posterior colporrhaphy with enterocele ligation    Patient is a 55 y.o. G2P2052female scheduled for posterior colporrhaphy with enterocele ligation on 05/02/2016 for symptomatic rectocele and possible enterocele.  Patient is status post TVH BSO with anterior colporrhaphy in April 2017. She has been using Estrace cream over the past month for enhancement of vaginal wellness. Patient has noted recurrence of pelvic pressure and a bulge from/at the introitus approximately 6 weeks ago. She reports normal bladder function. Bowel function is normal. However, she feels uncomfortable with pressure at the vaginal introitus and it is creating difficulties with her sitting. She would like surgical intervention.   Pertinent Gynecological History: No LMP recorded (lmp unknown). Patient is postmenopausal. Contraception: post menopausal status Last Pap: Normal Last mammogram: Normal Status post TVH BSO with anterior colporrhaphy-April 2017   Menstrual History: OB History    Gravida Para Term Preterm AB Living   2 2 2     2    SAB TAB Ectopic Multiple Live Births           2        Past Medical History:  Diagnosis Date  . Acute hepatitis C   . AR (allergic rhinitis)   . Bulging lumbar disc   . COPD (chronic obstructive pulmonary disease) ([place])   . Depression   . Hypertension   . Plantar fasciitis     Past Surgical History:  Procedure Laterality Date  . CYSTOCELE REPAIR N/A 10/19/2015   Procedure: ANTERIOR REPAIR (CYSTOCELE);  Surgeon: Brayton Mars, MD;  Location: [facility] ORS;  Service: Gynecology;  Laterality: N/A;  . TUBAL LIGATION    . VAGINAL HYSTERECTOMY Bilateral 10/19/2015   Procedure: HYSTERECTOMY VAGINAL WITH BILATERAL SALPINGO-OOPHERECTOMY;  Surgeon: Brayton Mars, MD;  Location: [facility] ORS;  Service: Gynecology;  Laterality: Bilateral;  . VAGINAL HYSTERECTOMY      OB History   Gravida Para Term Preterm AB Living  2 2 2     2   SAB TAB Ectopic Multiple Live Births          2    # Outcome Date GA Lbr Len/2nd Weight Sex Delivery Anes PTL Lv  2 Term 1981   5 lb 12.8 oz (2.631 kg) F Vag-Spont   LIV  1 Term 1979   4 lb 2.4 oz (1.882 kg) F Vag-Spont   LIV      Social History   Social History  . Marital status: Single    Spouse name: N/A  . Number of children: N/A  . Years of education: N/A   Social History Main Topics  . Smoking status: Current Every Day Smoker    Packs/day: 1.00    Types: Cigarettes  . Smokeless tobacco: Never Used  . Alcohol use 7.2 oz/week    12 Cans of beer per week     Comment: weekly, last drink saturday 4/22  . Drug use: No  . Sexual activity: No   Other Topics Concern  . Not on file   Social History Narrative  . No narrative on file    Family History  Problem Relation Age of Onset  . Breast cancer Mother   . Pancreatic cancer Mother   . Cancer Mother     breast and pancreatic  . Hypertension Mother   . Migraines Mother   . Heart disease Father   . Mental illness Sister     depression  . Heart  attack Sister   . Alcohol abuse Maternal Grandfather   . Cancer Maternal Grandmother     lung and brain  . Mental illness Sister     depression     (Not in a hospital admission)  Allergies  Allergen Reactions  . Bee Venom Anaphylaxis  . Seasonal Ic [Cholestatin] Other (See Comments)    Runny nose and itching eyes SEASONAL ALLEGIES    Review of Systems Constitutional: No recent fever/chills/sweats Respiratory: No recent cough/bronchitis Cardiovascular: No chest pain Gastrointestinal: No recent nausea/vomiting/diarrhea Genitourinary: No UTI symptoms Hematologic/lymphatic:No history of coagulopathy or recent blood thinner use    Objective:    BP 124/78   Pulse 65   Ht 5\' 4"  (1.626 m)   Wt 124 lb 11.2 oz (56.6 kg)   LMP  (LMP Unknown)   BMI 21.40 kg/m   General:   Normal  Skin:   normal  HEENT:   Normal  Neck:  Supple without Adenopathy or Thyromegaly  Lungs:   Heart:              Breasts:   Abdomen:  Pelvis:  M/S   Extremeties:  Neuro:    clear to auscultation bilaterally   Normal without murmur   Not Examined   soft, non-tender; bowel sounds normal; no masses,  no organomegaly   Exam deferred to OR  No CVAT  Warm/Dry   Normal        03/22/2016 Vagina-fair estrogen effect; first degree cystocele; moderate rectocele; questionable enterocele. Valsalva maneuver exacerbates posterior vaginal mucosa defect Bimanual-will masses rectovaginal-normal external exam    Assessment:    Rectocele, moderate, symptomatic Possible enterocele   Plan:  Posterior colporrhaphy with enterocele ligation  Preop counseling: Patient is to undergo posterior colporrhaphy with enterocele ligation on 05/02/2016. She is understanding of the planned procedure and is aware of and is accepting of all surgical risks which include but are not limited to bleeding, infection, pelvic organ injury with need for repair, blood clot disorders, anesthesia risks, etc. All questions have been answered. Informed consent is given. Patient is ready and willing to proceed with surgery as scheduled.  Brayton Mars, MD  Note: This dictation was prepared with Dragon dictation along with smaller phrase technology. Any transcriptional errors that result from this process are unintentional.

## 2016-04-27 NOTE — H&P (Signed)
Subjective:   PREOPERATIVE HISTORY AND PHYSICAL    Date of surgery: 05/02/2016 Procedure: Posterior colporrhaphy with enterocele ligation    Patient is a 55 y.o. G2P2040female scheduled for posterior colporrhaphy with enterocele ligation on 05/02/2016 for symptomatic rectocele and possible enterocele.  Patient is status post TVH BSO with anterior colporrhaphy in April 2017. She has been using Estrace cream over the past month for enhancement of vaginal wellness. Patient has noted recurrence of pelvic pressure and a bulge from/at the introitus approximately 6 weeks ago. She reports normal bladder function. Bowel function is normal. However, she feels uncomfortable with pressure at the vaginal introitus and it is creating difficulties with her sitting. She would like surgical intervention.   Pertinent Gynecological History: No LMP recorded (lmp unknown). Patient is postmenopausal. Contraception: post menopausal status Last Pap: Normal Last mammogram: Normal Status post TVH BSO with anterior colporrhaphy-April 2017   Menstrual History: OB History    Gravida Para Term Preterm AB Living   2 2 2     2    SAB TAB Ectopic Multiple Live Births           2        Past Medical History:  Diagnosis Date  . Acute hepatitis C   . AR (allergic rhinitis)   . Bulging lumbar disc   . COPD (chronic obstructive pulmonary disease) ([place])   . Depression   . Hypertension   . Plantar fasciitis     Past Surgical History:  Procedure Laterality Date  . CYSTOCELE REPAIR N/A 10/19/2015   Procedure: ANTERIOR REPAIR (CYSTOCELE);  Surgeon: Brayton Mars, MD;  Location: [facility] OR[facility];  Service: Gynecology;  Laterality: N/A;  . TUBAL LIGATION    . VAGINAL HYSTERECTOMY Bilateral 10/19/2015   Procedure: HYSTERECTOMY VAGINAL WITH BILATERAL SALPINGO-OOPHERECTOMY;  Surgeon: Brayton Mars, MD;  Location: [facility] OR[facility];  Service: Gynecology;  Laterality: Bilateral;  . VAGINAL HYSTERECTOMY      OB History   Gravida Para Term Preterm AB Living  2 2 2     2   SAB TAB Ectopic Multiple Live Births          2    # Outcome Date GA Lbr Len/2nd Weight Sex Delivery Anes PTL Lv  2 Term 1981   5 lb 12.8 oz (2.631 kg) F Vag-Spont   LIV  1 Term 1979   4 lb 2.4 oz (1.882 kg) F Vag-Spont   LIV      Social History   Social History  . Marital status: Single    Spouse name: N/A  . Number of children: N/A  . Years of education: N/A   Social History Main Topics  . Smoking status: Current Every Day Smoker    Packs/day: 1.00    Types: Cigarettes  . Smokeless tobacco: Never Used  . Alcohol use 7.2 oz/week    12 Cans of beer per week     Comment: weekly, last drink saturday 4/22  . Drug use: No  . Sexual activity: No   Other Topics Concern  . Not on file   Social History Narrative  . No narrative on file    Family History  Problem Relation Age of Onset  . Breast cancer Mother   . Pancreatic cancer Mother   . Cancer Mother     breast and pancreatic  . Hypertension Mother   . Migraines Mother   . Heart disease Father   . Mental illness Sister     depression  . Heart  attack Sister   . Alcohol abuse Maternal Grandfather   . Cancer Maternal Grandmother     lung and brain  . Mental illness Sister     depression     (Not in a hospital admission)  Allergies  Allergen Reactions  . Bee Venom Anaphylaxis  . Seasonal Ic [Cholestatin] Other (See Comments)    Runny nose and itching eyes SEASONAL ALLEGIES    Review of Systems Constitutional: No recent fever/chills/sweats Respiratory: No recent cough/bronchitis Cardiovascular: No chest pain Gastrointestinal: No recent nausea/vomiting/diarrhea Genitourinary: No UTI symptoms Hematologic/lymphatic:No history of coagulopathy or recent blood thinner use    Objective:    BP 124/78   Pulse 65   Ht 5\' 4"  (1.626 m)   Wt 124 lb 11.2 oz (56.6 kg)   LMP  (LMP Unknown)   BMI 21.40 kg/m   General:   Normal  Skin:   normal  HEENT:   Normal  Neck:  Supple without Adenopathy or Thyromegaly  Lungs:   Heart:              Breasts:   Abdomen:  Pelvis:  M/S   Extremeties:  Neuro:    clear to auscultation bilaterally   Normal without murmur   Not Examined   soft, non-tender; bowel sounds normal; no masses,  no organomegaly   Exam deferred to OR  No CVAT  Warm/Dry   Normal        03/22/2016 Vagina-fair estrogen effect; first degree cystocele; moderate rectocele; questionable enterocele. Valsalva maneuver exacerbates posterior vaginal mucosa defect Bimanual-will masses rectovaginal-normal external exam    Assessment:    Rectocele, moderate, symptomatic Possible enterocele   Plan:  Posterior colporrhaphy with enterocele ligation  Preop counseling: Patient is to undergo posterior colporrhaphy with enterocele ligation on 05/02/2016. She is understanding of the planned procedure and is aware of and is accepting of all surgical risks which include but are not limited to bleeding, infection, pelvic organ injury with need for repair, blood clot disorders, anesthesia risks, etc. All questions have been answered. Informed consent is given. Patient is ready and willing to proceed with surgery as scheduled.  Brayton Mars, MD  Note: This dictation was prepared with Dragon dictation along with smaller phrase technology. Any transcriptional errors that result from this process are unintentional.

## 2016-04-27 NOTE — Patient Instructions (Addendum)
1. Return in 1 week post surgery for evaluation. 2. Recommend patient have 2 weeks of convalescence before returning to work.

## 2016-04-27 NOTE — Patient Instructions (Signed)
  Your procedure is scheduled on: May 02, 2016 (Monday) [facility]nd [facility] To find out your arrival time please call [phone] between 1PM - 3PM on April 29, 2016 (Friday)  Remember: Instructions that are not followed completely may result in serious medical risk, up to and including death, or upon the discretion of your surgeon and anesthesiologist your surgery may need to be rescheduled.    _x___ 1. Do not eat food or drink liquids after midnight. No gum chewing or hard candies.     __x__ 2. No Alcohol for 24 hours before or after surgery.   __x__3. No Smoking for 24 prior to surgery.   ____  4. Bring all medications with you on the day of surgery if instructed.    __x__ 5. Notify your doctor if there is any change in your medical condition     (cold, fever, infections).     Do not wear jewelry, make-up, hairpins, clips or nail polish.  Do not wear lotions, powders, or perfumes. You may wear deodorant.  Do not shave 48 hours prior to surgery. Men may shave face and neck.  Do not bring valuables to the hospital.    [facility] is not responsible for any belongings or valuables.               Contacts, dentures or bridgework may not be worn into surgery.  Leave your suitcase in the car. After surgery it may be brought to your room.  For patients admitted to the hospital, discharge time is determined by your treatment team.   Patients discharged the day of surgery will not be allowed to drive home.    Please read over the following fact sheets that you were given:   [facility] Preparing for Surgery and or MRSA Information   _x___ Take these medicines the morning of surgery with A SIP OF WATER:    1. Metoprolol  2.  3.  4.  5.  6.  ____Fleets enema or Magnesium Citrate as directed.   __ Use CHG Soap or sage wipes as directed on instruction sheet   __x__ Use inhalers on the day of surgery and bring to hospital day of surgery (Use  Symbicort inhaler the day of surgery and bring to hospital)  ____ Stop metformin 2 days prior to surgery    ____ Take 1/2 of usual insulin dose the night before surgery and none on the morning of           surgery.   _x___ Stop aspirin or coumadin, or plavix (NO ASPIRIN)  x__ Stop Anti-inflammatories such as Advil, Aleve, Ibuprofen, Motrin, Naproxen,          Naprosyn, Goodies powders or aspirin products. Ok to take Tylenol.   ____ Stop supplements until after surgery.    ____ Bring C-Pap to the hospital.

## 2016-04-28 ENCOUNTER — Ambulatory Visit: Payer: PRIVATE HEALTH INSURANCE | Admitting: Podiatry

## 2016-04-28 LAB — RPR: RPR Ser Ql: NONREACTIVE

## 2016-05-02 ENCOUNTER — Ambulatory Visit: Payer: PRIVATE HEALTH INSURANCE | Admitting: Anesthesiology

## 2016-05-02 ENCOUNTER — Encounter: Payer: Self-pay | Admitting: *Deleted

## 2016-05-02 ENCOUNTER — Encounter
Admission: RE | Disposition: A | Discharge status: Home / Self Care | Payer: Self-pay | Source: Ambulatory Visit | Attending: Obstetrics and Gynecology

## 2016-05-02 ENCOUNTER — Observation Stay
Admission: RE | Admit: 2016-05-02 | Discharge: 2016-05-03 | Disposition: A | Discharge status: Home / Self Care | Payer: PRIVATE HEALTH INSURANCE | Source: Ambulatory Visit | Attending: Obstetrics and Gynecology | Admitting: Obstetrics and Gynecology

## 2016-05-02 DIAGNOSIS — Z808 Family history of malignant neoplasm of other organs or systems: Secondary | ICD-10-CM | POA: Diagnosis not present

## 2016-05-02 DIAGNOSIS — Z91030 Bee allergy status: Secondary | ICD-10-CM | POA: Insufficient documentation

## 2016-05-02 DIAGNOSIS — N816 Rectocele: Principal | ICD-10-CM | POA: Diagnosis present

## 2016-05-02 DIAGNOSIS — N815 Vaginal enterocele: Secondary | ICD-10-CM | POA: Diagnosis not present

## 2016-05-02 DIAGNOSIS — N952 Postmenopausal atrophic vaginitis: Secondary | ICD-10-CM | POA: Diagnosis not present

## 2016-05-02 DIAGNOSIS — G2581 Restless legs syndrome: Secondary | ICD-10-CM | POA: Diagnosis not present

## 2016-05-02 DIAGNOSIS — Z803 Family history of malignant neoplasm of breast: Secondary | ICD-10-CM | POA: Insufficient documentation

## 2016-05-02 DIAGNOSIS — Z801 Family history of malignant neoplasm of trachea, bronchus and lung: Secondary | ICD-10-CM | POA: Insufficient documentation

## 2016-05-02 DIAGNOSIS — Z8249 Family history of ischemic heart disease and other diseases of the circulatory system: Secondary | ICD-10-CM | POA: Diagnosis not present

## 2016-05-02 DIAGNOSIS — F419 Anxiety disorder, unspecified: Secondary | ICD-10-CM | POA: Insufficient documentation

## 2016-05-02 DIAGNOSIS — Z8619 Personal history of other infectious and parasitic diseases: Secondary | ICD-10-CM | POA: Diagnosis not present

## 2016-05-02 DIAGNOSIS — Z800 Family history of malignant neoplasm of digestive organs: Secondary | ICD-10-CM | POA: Insufficient documentation

## 2016-05-02 DIAGNOSIS — F17210 Nicotine dependence, cigarettes, uncomplicated: Secondary | ICD-10-CM | POA: Diagnosis not present

## 2016-05-02 DIAGNOSIS — Z818 Family history of other mental and behavioral disorders: Secondary | ICD-10-CM | POA: Diagnosis not present

## 2016-05-02 DIAGNOSIS — I10 Essential (primary) hypertension: Secondary | ICD-10-CM | POA: Diagnosis not present

## 2016-05-02 DIAGNOSIS — M1990 Unspecified osteoarthritis, unspecified site: Secondary | ICD-10-CM | POA: Insufficient documentation

## 2016-05-02 DIAGNOSIS — J449 Chronic obstructive pulmonary disease, unspecified: Secondary | ICD-10-CM | POA: Insufficient documentation

## 2016-05-02 DIAGNOSIS — F319 Bipolar disorder, unspecified: Secondary | ICD-10-CM | POA: Insufficient documentation

## 2016-05-02 HISTORY — PX: RECTOCELE REPAIR: SHX761

## 2016-05-02 SURGERY — COLPORRHAPHY, POSTERIOR, FOR RECTOCELE REPAIR
Anesthesia: General | Site: Vagina | Wound class: Clean Contaminated

## 2016-05-02 MED ORDER — HYDROMORPHONE HCL 1 MG/ML IJ SOLN
0.5000 mg | INTRAMUSCULAR | Status: AC | PRN
  Administered 2016-05-02 (×4): 0.5 mg via INTRAVENOUS

## 2016-05-02 MED ORDER — DEXAMETHASONE SODIUM PHOSPHATE 10 MG/ML IJ SOLN
INTRAMUSCULAR | Status: DC | PRN
  Administered 2016-05-02: 10 mg via INTRAVENOUS

## 2016-05-02 MED ORDER — SIMETHICONE 80 MG PO CHEW: 80.0000 mg | CHEWABLE_TABLET | Freq: Four times a day (QID) | ORAL | Status: DC | PRN

## 2016-05-02 MED ORDER — FAMOTIDINE 20 MG PO TABS
ORAL_TABLET | ORAL | Status: AC
  Filled 2016-05-02: qty 1

## 2016-05-02 MED ORDER — LIDOCAINE HCL (CARDIAC) 20 MG/ML IV SOLN
INTRAVENOUS | Status: DC | PRN
  Administered 2016-05-02: 40 mg via INTRAVENOUS

## 2016-05-02 MED ORDER — MOMETASONE FURO-FORMOTEROL FUM 200-5 MCG/ACT IN AERO
2.0000 | INHALATION_SPRAY | Freq: Two times a day (BID) | RESPIRATORY_TRACT | Status: DC
  Administered 2016-05-03: 2 via RESPIRATORY_TRACT
  Filled 2016-05-02: qty 8.8

## 2016-05-02 MED ORDER — ESTROGENS, CONJUGATED 0.625 MG/GM VA CREA
TOPICAL_CREAM | VAGINAL | Status: DC | PRN
  Administered 2016-05-02: 1 via VAGINAL

## 2016-05-02 MED ORDER — ONDANSETRON HCL 4 MG/2ML IJ SOLN
4.0000 mg | Freq: Once | INTRAMUSCULAR | Status: DC | PRN
Start: 2016-05-02 — End: 2016-05-02

## 2016-05-02 MED ORDER — FENTANYL CITRATE (PF) 100 MCG/2ML IJ SOLN
25.0000 ug | INTRAMUSCULAR | Status: DC | PRN
  Administered 2016-05-02 (×4): 25 ug via INTRAVENOUS

## 2016-05-02 MED ORDER — ACETAMINOPHEN 325 MG PO TABS: 650.0000 mg | ORAL_TABLET | ORAL | Status: DC | PRN

## 2016-05-02 MED ORDER — HYDROMORPHONE HCL 1 MG/ML IJ SOLN
INTRAMUSCULAR | Status: AC
  Filled 2016-05-02: qty 1

## 2016-05-02 MED ORDER — KETOROLAC TROMETHAMINE 30 MG/ML IJ SOLN
INTRAMUSCULAR | Status: DC | PRN
  Administered 2016-05-02: 30 mg via INTRAVENOUS

## 2016-05-02 MED ORDER — METOPROLOL SUCCINATE ER 50 MG PO TB24
50.0000 mg | ORAL_TABLET | Freq: Every day | ORAL | Status: DC
  Administered 2016-05-03: 50 mg via ORAL
  Filled 2016-05-02: qty 1

## 2016-05-02 MED ORDER — ONDANSETRON HCL 4 MG/2ML IJ SOLN
INTRAMUSCULAR | Status: DC | PRN
  Administered 2016-05-02: 4 mg via INTRAVENOUS

## 2016-05-02 MED ORDER — OXYCODONE-ACETAMINOPHEN 5-325 MG PO TABS
1.0000 | ORAL_TABLET | ORAL | Status: DC | PRN
  Administered 2016-05-02 (×2): 2 via ORAL
  Administered 2016-05-03: 1 via ORAL
  Administered 2016-05-03: 2 via ORAL
  Filled 2016-05-02 (×3): qty 2
  Filled 2016-05-02: qty 1

## 2016-05-02 MED ORDER — PROPOFOL 10 MG/ML IV BOLUS
INTRAVENOUS | Status: DC | PRN
  Administered 2016-05-02: 100 mg via INTRAVENOUS

## 2016-05-02 MED ORDER — QUETIAPINE FUMARATE ER 50 MG PO TB24
150.0000 mg | ORAL_TABLET | Freq: Every day | ORAL | Status: DC
  Filled 2016-05-02: qty 3

## 2016-05-02 MED ORDER — ESTROGENS, CONJUGATED 0.625 MG/GM VA CREA
TOPICAL_CREAM | VAGINAL | Status: AC
  Filled 2016-05-02: qty 30

## 2016-05-02 MED ORDER — BISACODYL 10 MG RE SUPP: 10.0000 mg | Freq: Every day | RECTAL | Status: DC | PRN

## 2016-05-02 MED ORDER — FENTANYL CITRATE (PF) 100 MCG/2ML IJ SOLN
INTRAMUSCULAR | Status: AC
  Filled 2016-05-02: qty 2

## 2016-05-02 MED ORDER — FLUTICASONE PROPIONATE 50 MCG/ACT NA SUSP
2.0000 | Freq: Every day | NASAL | Status: DC | PRN
  Filled 2016-05-02: qty 16

## 2016-05-02 MED ORDER — FENTANYL CITRATE (PF) 100 MCG/2ML IJ SOLN
INTRAMUSCULAR | Status: DC | PRN
  Administered 2016-05-02 (×2): 50 ug via INTRAVENOUS
  Administered 2016-05-02: 100 ug via INTRAVENOUS
  Administered 2016-05-02 (×2): 50 ug via INTRAVENOUS

## 2016-05-02 MED ORDER — KETOROLAC TROMETHAMINE 30 MG/ML IJ SOLN
30.0000 mg | Freq: Four times a day (QID) | INTRAMUSCULAR | Status: DC
  Administered 2016-05-02 – 2016-05-03 (×4): 30 mg via INTRAVENOUS
  Filled 2016-05-02 (×4): qty 1

## 2016-05-02 MED ORDER — INFLUENZA VAC SPLIT QUAD 0.5 ML IM SUSY
0.5000 mL | PREFILLED_SYRINGE | INTRAMUSCULAR | Status: AC
  Administered 2016-05-03: 0.5 mL via INTRAMUSCULAR
  Filled 2016-05-02: qty 0.5

## 2016-05-02 MED ORDER — DOCUSATE SODIUM 100 MG PO CAPS
100.0000 mg | ORAL_CAPSULE | Freq: Two times a day (BID) | ORAL | Status: DC
  Administered 2016-05-02 – 2016-05-03 (×3): 100 mg via ORAL
  Filled 2016-05-02 (×3): qty 1

## 2016-05-02 MED ORDER — LORATADINE 10 MG PO TABS: 10.0000 mg | ORAL_TABLET | Freq: Every day | ORAL | Status: DC | PRN

## 2016-05-02 MED ORDER — LACTATED RINGERS IV SOLN
INTRAVENOUS | Status: DC
  Administered 2016-05-02: 07:00:00 via INTRAVENOUS

## 2016-05-02 MED ORDER — LACTATED RINGERS IV SOLN
INTRAVENOUS | Status: DC
  Administered 2016-05-02 – 2016-05-03 (×3): via INTRAVENOUS

## 2016-05-02 MED ORDER — GLYCOPYRROLATE 0.2 MG/ML IJ SOLN
INTRAMUSCULAR | Status: DC | PRN
  Administered 2016-05-02: 0.2 mg via INTRAVENOUS

## 2016-05-02 MED ORDER — MORPHINE SULFATE (PF) 2 MG/ML IV SOLN
1.0000 mg | INTRAVENOUS | Status: DC | PRN
  Administered 2016-05-02 (×2): 2 mg via INTRAVENOUS
  Filled 2016-05-02 (×2): qty 1

## 2016-05-02 MED ORDER — MIDAZOLAM HCL 2 MG/2ML IJ SOLN
INTRAMUSCULAR | Status: DC | PRN
  Administered 2016-05-02: 1 mg via INTRAVENOUS

## 2016-05-02 MED ORDER — PNEUMOCOCCAL VAC POLYVALENT 25 MCG/0.5ML IJ INJ
0.5000 mL | INJECTION | INTRAMUSCULAR | Status: AC
  Administered 2016-05-03: 0.5 mL via INTRAMUSCULAR
  Filled 2016-05-02 (×2): qty 0.5

## 2016-05-02 MED ORDER — FAMOTIDINE 20 MG PO TABS
20.0000 mg | ORAL_TABLET | Freq: Once | ORAL | Status: AC
  Administered 2016-05-02: 20 mg via ORAL

## 2016-05-02 MED ORDER — MAGNESIUM OXIDE 400 (241.3 MG) MG PO TABS
400.0000 mg | ORAL_TABLET | Freq: Every day | ORAL | Status: DC
  Administered 2016-05-02: 400 mg via ORAL
  Filled 2016-05-02: qty 1

## 2016-05-02 MED ORDER — KETOROLAC TROMETHAMINE 30 MG/ML IJ SOLN: 30.0000 mg | Freq: Four times a day (QID) | INTRAMUSCULAR | Status: DC

## 2016-05-02 SURGICAL SUPPLY — 27 items
BAG URO DRAIN 2000ML W/SPOUT (MISCELLANEOUS) ×3 IMPLANT
CANISTER SUCT 1200ML W/VALVE (MISCELLANEOUS) ×3 IMPLANT
CATH FOLEY 2WAY  5CC 16FR (CATHETERS) ×2
CATH URTH 16FR FL 2W BLN LF (CATHETERS) ×1 IMPLANT
DRAPE PERI LITHO V/GYN (MISCELLANEOUS) ×3 IMPLANT
DRAPE SHEET LG 3/4 BI-LAMINATE (DRAPES) ×3 IMPLANT
DRAPE UNDER BUTTOCK W/FLU (DRAPES) ×3 IMPLANT
ELECT REM PT RETURN 9FT ADLT (ELECTROSURGICAL) ×3
ELECTRODE REM PT RTRN 9FT ADLT (ELECTROSURGICAL) ×1 IMPLANT
GAUZE PACK 2X3YD (MISCELLANEOUS) ×3 IMPLANT
GLOVE BIO SURGEON STRL SZ8 (GLOVE) ×6 IMPLANT
GLOVE INDICATOR 8.0 STRL GRN (GLOVE) ×3 IMPLANT
GOWN STRL REUS W/ TWL LRG LVL3 (GOWN DISPOSABLE) ×3 IMPLANT
GOWN STRL REUS W/ TWL XL LVL3 (GOWN DISPOSABLE) ×2 IMPLANT
GOWN STRL REUS W/TWL LRG LVL3 (GOWN DISPOSABLE) ×6
GOWN STRL REUS W/TWL XL LVL3 (GOWN DISPOSABLE) ×4
KIT RM TURNOVER CYSTO AR (KITS) ×3 IMPLANT
LABEL OR SOLS (LABEL) ×3 IMPLANT
NS IRRIG 500ML POUR BTL (IV SOLUTION) ×3 IMPLANT
PACK BASIN MINOR ARMC (MISCELLANEOUS) ×3 IMPLANT
PAD PREP 24X41 OB/GYN DISP (PERSONAL CARE ITEMS) ×3 IMPLANT
SPONGE XRAY 4X4 16PLY STRL (MISCELLANEOUS) ×3 IMPLANT
SUT CHROMIC 2 0 CT 1 (SUTURE) ×9 IMPLANT
SUT VIC AB 0 CT1 36 (SUTURE) IMPLANT
SUT VIC AB 0 CT2 27 (SUTURE) ×21 IMPLANT
SUT VIC AB 2-0 UR6 27 (SUTURE) IMPLANT
SYRINGE 10CC LL (SYRINGE) ×3 IMPLANT

## 2016-05-02 NOTE — Progress Notes (Signed)
Pt own nicotine patch applied per OK with Dr Enzo Bi. Right upper arm.

## 2016-05-02 NOTE — H&P (View-Only) (Signed)
Subjective:   PREOPERATIVE HISTORY AND PHYSICAL    Date of surgery: 05/02/2016 Procedure: Posterior colporrhaphy with enterocele ligation    Patient is a 55 y.o. G2P2014female scheduled for posterior colporrhaphy with enterocele ligation on 05/02/2016 for symptomatic rectocele and possible enterocele.  Patient is status post TVH BSO with anterior colporrhaphy in April 2017. She has been using Estrace cream over the past month for enhancement of vaginal wellness. Patient has noted recurrence of pelvic pressure and a bulge from/at the introitus approximately 6 weeks ago. She reports normal bladder function. Bowel function is normal. However, she feels uncomfortable with pressure at the vaginal introitus and it is creating difficulties with her sitting. She would like surgical intervention.   Pertinent Gynecological History: No LMP recorded (lmp unknown). Patient is postmenopausal. Contraception: post menopausal status Last Pap: Normal Last mammogram: Normal Status post TVH BSO with anterior colporrhaphy-April 2017   Menstrual History: OB History    Gravida Para Term Preterm AB Living   2 2 2     2    SAB TAB Ectopic Multiple Live Births           2        Past Medical History:  Diagnosis Date  . Acute hepatitis C   . AR (allergic rhinitis)   . Bulging lumbar disc   . COPD (chronic obstructive pulmonary disease) ([place])   . Depression   . Hypertension   . Plantar fasciitis     Past Surgical History:  Procedure Laterality Date  . CYSTOCELE REPAIR N/A 10/19/2015   Procedure: ANTERIOR REPAIR (CYSTOCELE);  Surgeon: Brayton Mars, MD;  Location: [facility] ORS;  Service: Gynecology;  Laterality: N/A;  . TUBAL LIGATION    . VAGINAL HYSTERECTOMY Bilateral 10/19/2015   Procedure: HYSTERECTOMY VAGINAL WITH BILATERAL SALPINGO-OOPHERECTOMY;  Surgeon: Brayton Mars, MD;  Location: [facility] ORS;  Service: Gynecology;  Laterality: Bilateral;  . VAGINAL HYSTERECTOMY      OB History   Gravida Para Term Preterm AB Living  2 2 2     2   SAB TAB Ectopic Multiple Live Births          2    # Outcome Date GA Lbr Len/2nd Weight Sex Delivery Anes PTL Lv  2 Term 1981   5 lb 12.8 oz (2.631 kg) F Vag-Spont   LIV  1 Term 1979   4 lb 2.4 oz (1.882 kg) F Vag-Spont   LIV      Social History   Social History  . Marital status: Single    Spouse name: N/A  . Number of children: N/A  . Years of education: N/A   Social History Main Topics  . Smoking status: Current Every Day Smoker    Packs/day: 1.00    Types: Cigarettes  . Smokeless tobacco: Never Used  . Alcohol use 7.2 oz/week    12 Cans of beer per week     Comment: weekly, last drink saturday 4/22  . Drug use: No  . Sexual activity: No   Other Topics Concern  . Not on file   Social History Narrative  . No narrative on file    Family History  Problem Relation Age of Onset  . Breast cancer Mother   . Pancreatic cancer Mother   . Cancer Mother     breast and pancreatic  . Hypertension Mother   . Migraines Mother   . Heart disease Father   . Mental illness Sister     depression  . Heart  attack Sister   . Alcohol abuse Maternal Grandfather   . Cancer Maternal Grandmother     lung and brain  . Mental illness Sister     depression     (Not in a hospital admission)  Allergies  Allergen Reactions  . Bee Venom Anaphylaxis  . Seasonal Ic [Cholestatin] Other (See Comments)    Runny nose and itching eyes SEASONAL ALLEGIES    Review of Systems Constitutional: No recent fever/chills/sweats Respiratory: No recent cough/bronchitis Cardiovascular: No chest pain Gastrointestinal: No recent nausea/vomiting/diarrhea Genitourinary: No UTI symptoms Hematologic/lymphatic:No history of coagulopathy or recent blood thinner use    Objective:    BP 124/78   Pulse 65   Ht 5\' 4"  (1.626 m)   Wt 124 lb 11.2 oz (56.6 kg)   LMP  (LMP Unknown)   BMI 21.40 kg/m   General:   Normal  Skin:   normal  HEENT:   Normal  Neck:  Supple without Adenopathy or Thyromegaly  Lungs:   Heart:              Breasts:   Abdomen:  Pelvis:  M/S   Extremeties:  Neuro:    clear to auscultation bilaterally   Normal without murmur   Not Examined   soft, non-tender; bowel sounds normal; no masses,  no organomegaly   Exam deferred to OR  No CVAT  Warm/Dry   Normal        03/22/2016 Vagina-fair estrogen effect; first degree cystocele; moderate rectocele; questionable enterocele. Valsalva maneuver exacerbates posterior vaginal mucosa defect Bimanual-will masses rectovaginal-normal external exam    Assessment:    Rectocele, moderate, symptomatic Possible enterocele   Plan:  Posterior colporrhaphy with enterocele ligation  Preop counseling: Patient is to undergo posterior colporrhaphy with enterocele ligation on 05/02/2016. She is understanding of the planned procedure and is aware of and is accepting of all surgical risks which include but are not limited to bleeding, infection, pelvic organ injury with need for repair, blood clot disorders, anesthesia risks, etc. All questions have been answered. Informed consent is given. Patient is ready and willing to proceed with surgery as scheduled.  Brayton Mars, MD  Note: This dictation was prepared with Dragon dictation along with smaller phrase technology. Any transcriptional errors that result from this process are unintentional.

## 2016-05-02 NOTE — Transfer of Care (Signed)
Immediate Anesthesia Transfer of Care Note  Patient: Monique Tucker  Procedure(s) Performed: Procedure(s): POSTERIOR REPAIR (RECTOCELE) (N/A) REPAIR OF ENTEROCELE (N/A)  Patient Location: PACU  Anesthesia Type:General  Level of Consciousness: awake, alert  and oriented  Airway & Oxygen Therapy: Patient Spontanous Breathing and Patient connected to face mask oxygen  Post-op Assessment: Report given to RN and Post -op Vital signs reviewed and stable  Post vital signs: Reviewed and stable  Last Vitals:  Vitals:   05/02/16 0558 05/02/16 0913  BP: 123/83 (!) 169/81  Pulse: 74 75  Resp: 14 14  Temp: 36.5 C 36.3 C    Last Pain:  Vitals:   05/02/16 0558  TempSrc: Tympanic  PainSc: 3          Complications: No apparent anesthesia complications

## 2016-05-02 NOTE — Anesthesia Postprocedure Evaluation (Signed)
Anesthesia Post Note  Patient: CAROLINDA LU  Procedure(s) Performed: Procedure(s) (LRB): POSTERIOR REPAIR (RECTOCELE) (N/A) REPAIR OF ENTEROCELE (N/A)  Patient location during evaluation: PACU Anesthesia Type: General Level of consciousness: awake and alert Pain management: pain level controlled Vital Signs Assessment: post-procedure vital signs reviewed and stable Respiratory status: spontaneous breathing, nonlabored ventilation, respiratory function stable and patient connected to nasal cannula oxygen Cardiovascular status: blood pressure returned to baseline and stable Postop Assessment: no signs of nausea or vomiting Anesthetic complications: no    Last Vitals:  Vitals:   05/02/16 1416 05/02/16 1523  BP: (!) 126/59 (!) 142/70  Pulse: (!) 57 60  Resp: 15 16  Temp: 36.7 C 36.7 C    Last Pain:  Vitals:   05/02/16 1521  TempSrc:   PainSc: [place]

## 2016-05-02 NOTE — Op Note (Signed)
OPERATIVE NOTE:  JENEVY AHERNE PROCEDURE DATE: 05/02/2016   PREOPERATIVE DIAGNOSIS:  1. Symptomatic rectocele 2. Enterocele  POSTOPERATIVE DIAGNOSIS:  1. Symptomatic rectocele 2. Enterocele  PROCEDURE: Posterior colporrhaphy with enterocele ligation   SURGEON:  Alanda Slim Defrancesco, MD ASSISTANTS: None ANESTHESIA: General LMA INDICATIONS: 55 y.o. G2P2002 status post TVH BSO with anterior colporrhaphy in the past, presents now for surgical management of symptomatic rectocele and enterocele  FINDINGS:   1. Enterocele and rectocele, moderate   I/O's: Total I/O In: -  Out: 10 [Blood:10] COUNTS:  YES SPECIMENS: Enterocele sac ANTIBIOTIC PROPHYLAXIS:N/A COMPLICATIONS: None immediate  PROCEDURE IN DETAIL: Patient was brought to the operating room placed in supine position. General anesthesia with LMA technique was completed without difficulty. Patient was placed in the dorsal lithotomy position using candycane stirrups. A ChloraPrep perineal intravaginal prep and drape was performed in standard fashion. Timeout was completed Allis clamps were placed on the lateral margins of the introitus. A diamond shape wedge of tissue was removed extendiong from the introitus of the vagina to the perianal region. The underlying vaginal mucosa was mobilized with Metzenbaum scissors in the midline. Geraldine Contras retractors were used to facilitate exposure. Enterocele sac is entered. The dissection of the enterocele sac is completed. This was followed by placement of 2 pursestring stitches of 0 Vicryl to reduce the hernia. The enterocele sac was excised. Next the vagina is mobilized off the perirectal fascia through sharp and blunt dissection. Horizontal mattress sutures were then placed using 0 Vicryl suture to reapproximate the levator muscle complex. The excess vaginal mucosa was trimmed with Metzenbaum scissors. The vagina was then reapproximated using 2-0 chromic sutures in a simple interrupted  manner. Following completion of the repair vagina was packed with Kerlix gauze with Premarin cream. Patient was then awakened mobilized and taken to recovery in satisfactory condition.  Martin A. Zipporah Plants, MD, ACOG [facility]

## 2016-05-02 NOTE — Anesthesia Preprocedure Evaluation (Addendum)
Anesthesia Evaluation  Patient identified by MRN, date of birth, ID band Patient awake    Reviewed: Allergy & Precautions, NPO status , Patient's Chart, lab work & pertinent test results, reviewed documented beta blocker date and time   Airway Mallampati: II  TM Distance: >3 FB     Dental  (+) Chipped   Pulmonary COPD, Current Smoker,           Cardiovascular hypertension, Pt. on medications and Pt. on home beta blockers      Neuro/Psych PSYCHIATRIC DISORDERS Anxiety Depression Bipolar Disorder    GI/Hepatic (+) Hepatitis -  Endo/Other    Renal/GU      Musculoskeletal  (+) Arthritis ,   Abdominal   Peds  Hematology   Anesthesia Other Findings RLS. EKG ok.  Reproductive/Obstetrics                            Anesthesia Physical Anesthesia Plan  ASA: III  Anesthesia Plan: General   Post-op Pain Management:    Induction: Intravenous  Airway Management Planned: Oral ETT and LMA  Additional Equipment:   Intra-op Plan:   Post-operative Plan:   Informed Consent: I have reviewed the patients History and Physical, chart, labs and discussed the procedure including the risks, benefits and alternatives for the proposed anesthesia with the patient or authorized representative who has indicated his/her understanding and acceptance.     Plan Discussed with: CRNA  Anesthesia Plan Comments:         Anesthesia Quick Evaluation

## 2016-05-02 NOTE — Interval H&P Note (Signed)
History and Physical Interval Note:  05/02/2016 7:22 AM  Monique Tucker  has presented today for surgery, with the diagnosis of RECTOCELE,VAGINAL ATROPHY  The various methods of treatment have been discussed with the patient and family. After consideration of risks, benefits and other options for treatment, the patient has consented to  Procedure(s): POSTERIOR REPAIR (RECTOCELE) (N/A) REPAIR OF ENTEROCELE (N/A) as a surgical intervention .  The patient's history has been reviewed, patient examined, no change in status, stable for surgery.  I have reviewed the patient's chart and labs.  Questions were answered to the patient's satisfaction.     Hassell Done A Perri Lamagna

## 2016-05-02 NOTE — Anesthesia Procedure Notes (Signed)
Procedure Name: LMA Insertion Date/Time: 05/02/2016 7:41 AM Performed by: Kennon Holter Pre-anesthesia Checklist: Patient identified, Patient being monitored, Timeout performed, Emergency Drugs available and Suction available Patient Re-evaluated:Patient Re-evaluated prior to inductionOxygen Delivery Method: Circle system utilized Preoxygenation: Pre-oxygenation with 100% oxygen Intubation Type: IV induction Ventilation: Mask ventilation without difficulty LMA: LMA inserted Tube type: Oral Number of attempts: 1 Placement Confirmation: positive ETCO2 and breath sounds checked- equal and bilateral Tube secured with: Tape Dental Injury: Teeth and Oropharynx as per pre-operative assessment

## 2016-05-03 DIAGNOSIS — N816 Rectocele: Secondary | ICD-10-CM | POA: Diagnosis not present

## 2016-05-03 LAB — SURGICAL PATHOLOGY

## 2016-05-03 LAB — HEMOGLOBIN: Hemoglobin: 13.3 g/dL (ref 12.0–16.0)

## 2016-05-03 MED ORDER — DOCUSATE SODIUM 100 MG PO CAPS: 100.0000 mg | ORAL_CAPSULE | Freq: Two times a day (BID) | ORAL | 0 refills | Status: DC

## 2016-05-03 MED ORDER — IBUPROFEN 800 MG PO TABS: 800.0000 mg | ORAL_TABLET | Freq: Three times a day (TID) | ORAL | 1 refills | Status: DC

## 2016-05-03 MED ORDER — OXYCODONE-ACETAMINOPHEN 5-325 MG PO TABS: 1.0000 | ORAL_TABLET | ORAL | 0 refills | Status: DC | PRN

## 2016-05-03 NOTE — Discharge Summary (Signed)
Physician Discharge Summary  Patient ID: Monique Tucker MRN: [id number] DOB/AGE: 1961-03-02 55 y.o.  Admit date: 05/02/2016 Discharge date: 05/03/2016  Admission Diagnoses: Rectocele  Discharge Diagnoses:  Rectocele, Enterocele  Operative Procedures: Procedure(s): POSTERIOR REPAIR (RECTOCELE) (N/A) REPAIR OF ENTEROCELE (N/A)  Hospital Course: Uncomplicated .   Significant Diagnostic Studies:  Lab Results  Component Value Date   HGB 13.3 05/03/2016   HGB 14.9 04/27/2016   HGB 13.0 10/20/2015   Lab Results  Component Value Date   HCT 42.2 04/27/2016   HCT 43.3 12/18/2015   HCT 42.2 10/14/2015   CBC Latest Ref Rng & Units 05/03/2016 04/27/2016 12/18/2015  WBC 3.6 - 11.0 K/uL - 5.3 4.4  Hemoglobin 12.0 - 16.0 g/dL 13.3 14.9 -  Hematocrit 35.0 - 47.0 % - 42.2 43.3  Platelets 150 - 440 K/uL - 146(L) 170     Discharged Condition: good  Discharge Exam: Blood pressure (!) 149/55, pulse (!) 47, temperature 98.4 F (36.9 C), temperature source Oral, resp. rate 18, height 5\' 4"  (1.626 m), weight 121 lb 11.2 oz (55.2 kg), SpO2 96 %. Incision/Wound: none  Disposition: 01-Home or Self Care  Discharge Instructions    Discharge patient    Complete by:  As directed    Discharge patient    Complete by:  As directed        Medication List    TAKE these medications   budesonide-formoterol 160-4.5 MCG/ACT inhaler Commonly known as:  SYMBICORT Inhale 2 puffs into the lungs 2 (two) times daily.   cetirizine-pseudoephedrine 5-120 MG tablet Commonly known as:  ZYRTEC-D Take 1 tablet by mouth daily as needed (takes in the fall mostly). Reported on 09/09/2015   docusate sodium 100 MG capsule Commonly known as:  COLACE Take 1 capsule (100 mg total) by mouth 2 (two) times daily.   EPINEPHrine 0.3 mg/0.3 mL Soaj injection Commonly known as:  EPI-PEN Inject 0.3 mLs (0.3 mg total) into the muscle once. What changed:  when to take this  reasons to take this   estradiol 0.1  MG/GM vaginal cream Commonly known as:  ESTRACE Apply 1/2 gram per vagina every night for 4 weeks, then apply two times a week What changed:  how much to take  how to take this  when to take this  additional instructions   fluticasone 50 MCG/ACT nasal spray Commonly known as:  FLONASE Place 2 sprays into both nostrils daily. What changed:  when to take this  reasons to take this   ibuprofen 800 MG tablet Commonly known as:  ADVIL,MOTRIN Take 1 tablet (800 mg total) by mouth 3 (three) times daily.   magnesium gluconate 500 MG tablet Commonly known as:  MAGONATE Take 500 mg by mouth at bedtime.   metoprolol succinate 50 MG 24 hr tablet Commonly known as:  TOPROL-XL Take 1 tablet (50 mg total) by mouth daily.   naproxen 500 MG tablet Commonly known as:  NAPROSYN Take 500 mg by mouth every 12 (twelve) hours as needed (for pain.).   oxyCODONE-acetaminophen 5-325 MG tablet Commonly known as:  PERCOCET/ROXICET Take 1-2 tablets by mouth every 4 (four) hours as needed (moderate to severe pain (when tolerating fluids)).   QUEtiapine Fumarate 150 MG 24 hr tablet Commonly known as:  SEROQUEL XR Take 1 tablet (150 mg total) by mouth at bedtime.   traMADol 50 MG tablet Commonly known as:  ULTRAM Take 1 tablet (50 mg total) by mouth every 8 (eight) hours as needed. What changed:  when  to take this  reasons to take this      Follow-up Information    Brayton Mars, MD. Go in 1 week(s).   Specialties:  Obstetrics and Gynecology, Radiology Why:  05/10/16 @ 10:15 am with Dr. Enzo Bi for Post Op Check. Contact information: [place] [address] [phone]           Signed: Alanda Slim Amit Meloy 05/03/2016, 11:58 AM

## 2016-05-03 NOTE — Progress Notes (Signed)
Pt discharged home.  Discharge instructions, prescriptions and follow up appointment given to and reviewed with pt.  Pt verbalized understanding.  Escorted by auxillary. 

## 2016-05-10 ENCOUNTER — Ambulatory Visit (INDEPENDENT_AMBULATORY_CARE_PROVIDER_SITE_OTHER): Payer: PRIVATE HEALTH INSURANCE | Admitting: Obstetrics and Gynecology

## 2016-05-10 ENCOUNTER — Encounter: Payer: Self-pay | Admitting: Obstetrics and Gynecology

## 2016-05-10 VITALS — BP 159/92 | HR 94 | Ht 64.0 in | Wt 118.9 lb

## 2016-05-10 DIAGNOSIS — R300 Dysuria: Secondary | ICD-10-CM

## 2016-05-10 DIAGNOSIS — Z09 Encounter for follow-up examination after completed treatment for conditions other than malignant neoplasm: Secondary | ICD-10-CM

## 2016-05-10 DIAGNOSIS — N951 Menopausal and female climacteric states: Secondary | ICD-10-CM

## 2016-05-10 LAB — POCT URINALYSIS DIPSTICK
Bilirubin, UA: NEGATIVE
GLUCOSE UA: NEGATIVE
Ketones, UA: NEGATIVE
NITRITE UA: NEGATIVE
PROTEIN: 1
UROBILINOGEN UA: NEGATIVE
pH, UA: 6

## 2016-05-10 MED ORDER — ESTRADIOL 1 MG PO TABS: 1.0000 mg | ORAL_TABLET | Freq: Every day | ORAL | 12 refills | Status: DC

## 2016-05-10 MED ORDER — NITROFURANTOIN MONOHYD MACRO 100 MG PO CAPS: 100.0000 mg | ORAL_CAPSULE | Freq: Two times a day (BID) | ORAL | 0 refills | Status: DC

## 2016-05-10 NOTE — Progress Notes (Signed)
Chief complaint: 1. One week postop check 2. Status post posterior colporrhaphy with enterocele ligation  Patient presents for her 1 week postop check. She is doing well with normal bowel function. Bladder function is notable for some intermittent frequency and dysuria. Previously noted pelvic pressure has resolved. She is not experiencing any significant vaginal bleeding or drainage. Pain is well controlled with ibuprofen only. Main concern today is severe vasomotor symptoms. She would like to initiate ERT therapy for her hot flashes.  OBJECTIVE: BP (!) 159/92   Pulse 94   Ht 5\' 4"  (1.626 m)   Wt 118 lb 14.4 oz (53.9 kg)   LMP  (LMP Unknown)   BMI 20.41 kg/m  Physical exam-deferred  ASSESSMENT: 1. Normal postop check 1 week status post posterior colporrhaphy with enterocele ligation 2. Severe vasomotor symptoms, requesting estrogen replacement therapy 3. Possible UTI based on urinalysis evaluation  PLAN: 1. Begin estradiol 1 mg daily 2. Return in 5 weeks for final postop check 3. Macrobid twice a day for 7 days 4. Urine culture is sent  Brayton Mars, MD  Note: This dictation was prepared with Dragon dictation along with smaller phrase technology. Any transcriptional errors that result from this process are unintentional.

## 2016-05-10 NOTE — Patient Instructions (Signed)
1. Continue water intake and cranberry juice intake 2. Urinalysis and urine culture is performed today 3. Macrobid 1 tablet twice a day for 7 days as written 4. Begin estrogen replacement therapy in the form of estradiol 1 mg a day 5. Return in 5 weeks for final postop check

## 2016-05-13 LAB — URINE CULTURE

## 2016-05-27 ENCOUNTER — Encounter: Payer: Self-pay | Admitting: Family Medicine

## 2016-05-27 ENCOUNTER — Encounter (INDEPENDENT_AMBULATORY_CARE_PROVIDER_SITE_OTHER): Payer: Self-pay

## 2016-05-27 ENCOUNTER — Ambulatory Visit (INDEPENDENT_AMBULATORY_CARE_PROVIDER_SITE_OTHER): Payer: PRIVATE HEALTH INSURANCE | Admitting: Family Medicine

## 2016-05-27 VITALS — BP 163/77 | HR 63 | Temp 97.7°F | Ht 64.5 in | Wt 124.2 lb

## 2016-05-27 DIAGNOSIS — I10 Essential (primary) hypertension: Secondary | ICD-10-CM

## 2016-05-27 DIAGNOSIS — F339 Major depressive disorder, recurrent, unspecified: Secondary | ICD-10-CM | POA: Diagnosis not present

## 2016-05-27 DIAGNOSIS — N390 Urinary tract infection, site not specified: Secondary | ICD-10-CM

## 2016-05-27 MED ORDER — METOPROLOL SUCCINATE ER 50 MG PO TB24: 50.0000 mg | ORAL_TABLET | Freq: Every day | ORAL | 2 refills | Status: DC

## 2016-05-27 MED ORDER — LISINOPRIL 10 MG PO TABS: 10.0000 mg | ORAL_TABLET | Freq: Every day | ORAL | 3 refills | Status: DC

## 2016-05-27 MED ORDER — QUETIAPINE FUMARATE ER 150 MG PO TB24: 150.0000 mg | ORAL_TABLET | Freq: Every day | ORAL | 2 refills | Status: DC

## 2016-05-27 MED ORDER — SULFAMETHOXAZOLE-TRIMETHOPRIM 800-160 MG PO TABS: 1.0000 | ORAL_TABLET | Freq: Two times a day (BID) | ORAL | 0 refills | Status: DC

## 2016-05-27 NOTE — Assessment & Plan Note (Signed)
Stable, continue current regimen 

## 2016-05-27 NOTE — Patient Instructions (Signed)
Follow up in 1 month   

## 2016-05-27 NOTE — Progress Notes (Signed)
BP (!) 163/77 (BP Location: Right Arm, Patient Position: Sitting, Cuff Size: Small)   Pulse 63   Temp 97.7 F (36.5 C)   Ht 5' 4.5" (1.638 m)   Wt 124 lb 3.2 oz (56.3 kg)   LMP  (LMP Unknown)   SpO2 97%   BMI 20.99 kg/m    Subjective:    Patient ID: Monique Tucker, female    DOB: 1961/01/16, 55 y.o.   MRN: [id number]  HPI: Monique Tucker is a 55 y.o. female  Chief Complaint  Patient presents with  . Urinary Tract Infection    Had surgery 05/02/16. Had catheter. Got UTI afterwards (2 weeks ago). Took Macrobid. UTI symptoms now back-but has taken AZO.   Marland Kitchen Urinary Frequency  . Flank Pain   Patient presents with urinary frequency, dysuria, low back aching, and suprapubic pressure for several weeks. States it originally started when the catheter was removed post-op 3 weeks ago - was treated with macrobid with no resolution. Taking AZO for symptomatic relief. No fever, hematuria, or N/V.   Also needs refills on several medications while she is here. Doing well on seroquel for her moods, no side effects and taking faithfully.  BPs remain elevated despite her efforts to improve diet and exercise. Taking metoprolol regularly. Denies CP, SOB, HAs.   Past Medical History:  Diagnosis Date  . Acute hepatitis C   . Anxiety   . AR (allergic rhinitis)   . Arthritis   . Bulging lumbar disc   . COPD (chronic obstructive pulmonary disease) ([facility])    mild  . Depression   . Hypertension   . Plantar fasciitis   . Restless leg syndrome, controlled    Social History   Social History  . Marital status: Single    Spouse name: N/A  . Number of children: N/A  . Years of education: N/A   Occupational History  . Not on file.   Social History Main Topics  . Smoking status: Current Every Day Smoker    Packs/day: 1.00    Types: Cigarettes  . Smokeless tobacco: Never Used     Comment: patient has been Rx nicotine patch  . Alcohol use Yes     Comment: occassional  . Drug use: No  .  Sexual activity: No   Other Topics Concern  . Not on file   Social History Narrative  . No narrative on file    Relevant past medical, surgical, family and social history reviewed and updated as indicated. Interim medical history since our last visit reviewed. Allergies and medications reviewed and updated.  Review of Systems  Constitutional: Negative.   HENT: Negative.   Eyes: Negative.   Respiratory: Negative.   Cardiovascular: Negative.   Gastrointestinal: Negative.   Genitourinary: Positive for dysuria, frequency and pelvic pain.  Musculoskeletal: Positive for back pain.  Neurological: Negative.   Psychiatric/Behavioral: Negative.     Per HPI unless specifically indicated above     Objective:    BP (!) 163/77 (BP Location: Right Arm, Patient Position: Sitting, Cuff Size: Small)   Pulse 63   Temp 97.7 F (36.5 C)   Ht 5' 4.5" (1.638 m)   Wt 124 lb 3.2 oz (56.3 kg)   LMP  (LMP Unknown)   SpO2 97%   BMI 20.99 kg/m   Wt Readings from Last 3 Encounters:  05/27/16 124 lb 3.2 oz (56.3 kg)  05/10/16 118 lb 14.4 oz (53.9 kg)  05/02/16 121 lb 11.2 oz (55.2  kg)    Physical Exam  Constitutional: She is oriented to person, place, and time. She appears well-developed and well-nourished. No distress.  HENT:  Head: Atraumatic.  Eyes: Conjunctivae are normal. Pupils are equal, round, and reactive to light. No scleral icterus.  Neck: Normal range of motion. Neck supple.  Cardiovascular: Normal rate, regular rhythm and normal heart sounds.   Pulmonary/Chest: Effort normal. No respiratory distress.  Abdominal: Soft. Bowel sounds are normal. She exhibits no distension. There is tenderness (mild TTP in suprapubic area). There is no guarding.  Musculoskeletal: Normal range of motion.  No CVA tenderness  Neurological: She is alert and oriented to person, place, and time.  Skin: Skin is warm and dry.  Psychiatric: She has a normal mood and affect. Her behavior is normal.    Nursing note and vitals reviewed.     Assessment & Plan:   Problem List Items Addressed This Visit      Cardiovascular and Mediastinum   Hypertension    Poorly controlled, will add lisinopril and recheck in 1 month. Discussed smoking cessation and decreasing use of Naproxen. 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 Repeat UA if still symptomatic   Relevant Medications   sulfamethoxazole-trimethoprim (BACTRIM DS,SEPTRA DS) 800-160 MG tablet   Other Relevant Orders   UA/M w/rflx Culture, Routine    Await urine cx. Recommended good hydration, adding probiotic to daily regimen.    Follow up plan: Return in about 4 weeks (around 06/24/2016) for BP .

## 2016-05-27 NOTE — Assessment & Plan Note (Signed)
Poorly controlled, will add lisinopril and recheck in 1 month. Discussed smoking cessation and decreasing use of Naproxen. Discussed risks with new medication

## 2016-05-29 LAB — UA/M W/RFLX CULTURE, ROUTINE
Bilirubin, UA: NEGATIVE
Glucose, UA: NEGATIVE
Ketones, UA: NEGATIVE
Nitrite, UA: POSITIVE — AB
Protein, UA: NEGATIVE
RBC, UA: NEGATIVE
Specific Gravity, UA: 1.015 (ref 1.005–1.030)
Urobilinogen, Ur: 0.2 mg/dL (ref 0.2–1.0)
pH, UA: 5 (ref 5.0–7.5)

## 2016-05-29 LAB — MICROSCOPIC EXAMINATION

## 2016-05-29 LAB — URINE CULTURE, REFLEX: ORGANISM ID, BACTERIA: NO GROWTH

## 2016-06-06 ENCOUNTER — Ambulatory Visit (INDEPENDENT_AMBULATORY_CARE_PROVIDER_SITE_OTHER): Payer: PRIVATE HEALTH INSURANCE | Admitting: Family Medicine

## 2016-06-06 ENCOUNTER — Encounter: Payer: Self-pay | Admitting: Family Medicine

## 2016-06-06 DIAGNOSIS — I10 Essential (primary) hypertension: Secondary | ICD-10-CM

## 2016-06-06 DIAGNOSIS — F339 Major depressive disorder, recurrent, unspecified: Secondary | ICD-10-CM | POA: Diagnosis not present

## 2016-06-06 LAB — MICROSCOPIC EXAMINATION: RBC, UA: NONE SEEN /hpf (ref 0–?)

## 2016-06-06 LAB — URINALYSIS, ROUTINE W REFLEX MICROSCOPIC
BILIRUBIN UA: NEGATIVE
GLUCOSE, UA: NEGATIVE
NITRITE UA: NEGATIVE
Protein, UA: NEGATIVE
SPEC GRAV UA: 1.02 (ref 1.005–1.030)
Urobilinogen, Ur: 0.2 mg/dL (ref 0.2–1.0)
pH, UA: 5 (ref 5.0–7.5)

## 2016-06-06 MED ORDER — METOPROLOL SUCCINATE ER 100 MG PO TB24: 100.0000 mg | ORAL_TABLET | Freq: Every day | ORAL | 2 refills | Status: DC

## 2016-06-06 MED ORDER — FLUOXETINE HCL (PMDD) 10 MG PO TABS: 10.0000 mg | ORAL_TABLET | Freq: Every day | ORAL | 1 refills | Status: DC

## 2016-06-06 NOTE — Assessment & Plan Note (Signed)
For ongoing depression will continue Seroquel same dose and fluoxetine low-dose 10 mg recheck 2 weeks to observe response.   reviewed with patient lifestyle 4 intervention and help with improvement in multiple family issues.
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
## 2016-06-06 NOTE — Assessment & Plan Note (Signed)
Discuss hypertension poor control and stopped lisinopril because of side effects with rapid pulse anxiety will increase metoprolol from 50-100 mg as that's been well tolerated.

## 2016-06-07 ENCOUNTER — Telehealth: Payer: Self-pay | Admitting: Family Medicine

## 2016-06-07 DIAGNOSIS — N183 Chronic kidney disease, stage 3 unspecified: Secondary | ICD-10-CM

## 2016-06-07 LAB — BASIC METABOLIC PANEL
BUN / CREAT RATIO: 18 (ref 9–23)
BUN: 28 mg/dL — ABNORMAL HIGH (ref 6–24)
CHLORIDE: 98 mmol/L (ref 96–106)
CO2: 22 mmol/L (ref 18–29)
Calcium: 10.9 mg/dL — ABNORMAL HIGH (ref 8.7–10.2)
Creatinine, Ser: 1.53 mg/dL — ABNORMAL HIGH (ref 0.57–1.00)
GFR, EST AFRICAN AMERICAN: 44 mL/min/{1.73_m2} — AB (ref >59)
GFR, EST NON AFRICAN AMERICAN: 38 mL/min/{1.73_m2} — AB (ref >59)
Glucose: 116 mg/dL — ABNORMAL HIGH (ref 65–99)
POTASSIUM: 5.2 mmol/L (ref 3.5–5.2)
Sodium: 137 mmol/L (ref 134–144)

## 2016-06-07 NOTE — Telephone Encounter (Signed)
Phone call Discussed with patient kidneys becoming very weak will stop Naprosyn and other nonsteroidal medication will use Tylenol We will check BMP next office visit in 2 weeks.

## 2016-06-09 ENCOUNTER — Ambulatory Visit (INDEPENDENT_AMBULATORY_CARE_PROVIDER_SITE_OTHER): Payer: PRIVATE HEALTH INSURANCE | Admitting: Podiatry

## 2016-06-09 ENCOUNTER — Encounter: Payer: Self-pay | Admitting: Podiatry

## 2016-06-09 DIAGNOSIS — L600 Ingrowing nail: Secondary | ICD-10-CM | POA: Diagnosis not present

## 2016-06-09 DIAGNOSIS — M79673 Pain in unspecified foot: Secondary | ICD-10-CM | POA: Diagnosis not present

## 2016-06-09 DIAGNOSIS — M79672 Pain in left foot: Secondary | ICD-10-CM | POA: Diagnosis not present

## 2016-06-09 DIAGNOSIS — M79675 Pain in left toe(s): Secondary | ICD-10-CM | POA: Diagnosis not present

## 2016-06-09 DIAGNOSIS — M79674 Pain in right toe(s): Secondary | ICD-10-CM | POA: Diagnosis not present

## 2016-06-09 DIAGNOSIS — M722 Plantar fascial fibromatosis: Secondary | ICD-10-CM | POA: Diagnosis not present

## 2016-06-09 DIAGNOSIS — M79671 Pain in right foot: Secondary | ICD-10-CM

## 2016-06-12 MED ORDER — BETAMETHASONE SOD PHOS & ACET 6 (3-3) MG/ML IJ SUSP: 3.0000 mg | Freq: Once | INTRAMUSCULAR | Status: AC

## 2016-06-12 NOTE — Progress Notes (Signed)
Subjective: Patient presents today for a new complaint of pain and tenderness in the feet bilaterally. Patient states the foot pain has been hurting for several weeks now. Patient states that it hurts in the mornings with the first steps out of bed. Patient also presents for concern regarding her bilateral great toenails. Patient has a history of partial permanent toenail avulsions. Patient states the bilateral great toenails are painful in the into the side of her toe. Patient presents today for further treatment and evaluation  Objective: Physical Exam General: The patient is alert and oriented x3 in no acute distress.  Dermatology: Incurvated nails noted to the medial aspect of the bilateral great toes. Partial permanent toenail avulsion appears to have completely healed however she still experiences pain on palpation to the medial aspect of the bilateral great toenails. Skin is warm, dry and supple bilateral lower extremities. Negative for open lesions or macerations bilateral.   Vascular: Dorsalis Pedis and Posterior Tibial pulses palpable bilateral.  Capillary fill time is immediate to all digits.  Neurological: Epicritic and protective threshold intact bilateral.   Musculoskeletal: Tenderness to palpation at the medial calcaneal tubercale and through the insertion of the plantar fascia of the bilateral feet. All other joints range of motion within normal limits bilateral. Strength 5/5 in all groups bilateral.    Assessment: #1 plantar fasciitis bilateral feet #2 pain in bilateral feet #3 pain in bilateral great toenails #4 incurvated toenails medial aspects of bilateral great toes  Plan of Care:  1. Patient evaluated. Xrays reviewed.   2. Injection of 0.5cc Celestone soluspan injected into the bilateral heels.  3. Instructed patient regarding therapies and modalities at home to alleviate symptoms.  4. Mechanical debridement of the medial border of the bilateral great toenails was  performed using a nail nipper without incident or bleeding. 5. Rx for Antiinflammatory pain cream dispensed through [employer].  6. Return to clinic in 4 weeks.

## 2016-06-15 ENCOUNTER — Ambulatory Visit (INDEPENDENT_AMBULATORY_CARE_PROVIDER_SITE_OTHER): Payer: PRIVATE HEALTH INSURANCE | Admitting: Obstetrics and Gynecology

## 2016-06-15 ENCOUNTER — Encounter: Payer: Self-pay | Admitting: Obstetrics and Gynecology

## 2016-06-15 VITALS — BP 147/66 | HR 55 | Ht 64.0 in | Wt 123.6 lb

## 2016-06-15 DIAGNOSIS — N816 Rectocele: Secondary | ICD-10-CM

## 2016-06-15 DIAGNOSIS — Z09 Encounter for follow-up examination after completed treatment for conditions other than malignant neoplasm: Secondary | ICD-10-CM

## 2016-06-15 NOTE — Patient Instructions (Signed)
1. Resume activities as tolerated 2. Return in 1 year for annual exam 3. Avoid prolonged heavy lifting.

## 2016-06-15 NOTE — Progress Notes (Signed)
Chief complaint: 1. Five-week postop check-final 2. Status post posterior colporrhaphy with enterocele ligation 3. Vasomotor symptoms  Patient is on estradiol 1 mg a day for vasomotor symptoms. Hot flashes have resolved completely. Patient is very pleased with the medication. She is not experiencing any side effects.  Bowel function and bladder function are normal at this time. She is not experiencing any significant pelvic pressure symptoms. Minimal vaginal secretions are noted.  OBJECTIVE: BP (!) 147/66   Pulse (!) 55   Ht 5\' 4"  (1.626 m)   Wt 123 lb 9 oz (56 kg)   LMP  (LMP Unknown)   BMI 21.21 kg/m  Pleasant female in no acute distress Pelvic: External genitalia-normal BUS-normal Vagina-good vault support; vaginal cuff has mild scarring-slight and to the left of center, nontender. Suture line is healed  ASSESSMENT: 1. Status post posterior colporrhaphy with enterocele ligation 2. Normal final postop check 3. Vasomotor symptoms, controlled on ERT  PLAN: 1. Resume activities as tolerated 2. Avoid prolonged heavy lifting 3. Continue with estradiol 1 mg a day 4. Return in 1 year for annual exam  Brayton Mars, MD  Note: This dictation was prepared with Dragon dictation along with smaller phrase technology. Any transcriptional errors that result from this process are unintentional.

## 2016-06-16 ENCOUNTER — Ambulatory Visit (INDEPENDENT_AMBULATORY_CARE_PROVIDER_SITE_OTHER): Payer: PRIVATE HEALTH INSURANCE | Admitting: Family Medicine

## 2016-06-16 ENCOUNTER — Encounter: Payer: Self-pay | Admitting: Family Medicine

## 2016-06-16 DIAGNOSIS — I10 Essential (primary) hypertension: Secondary | ICD-10-CM | POA: Diagnosis not present

## 2016-06-16 DIAGNOSIS — F3341 Major depressive disorder, recurrent, in partial remission: Secondary | ICD-10-CM | POA: Diagnosis not present

## 2016-06-16 MED ORDER — AMLODIPINE BESYLATE 5 MG PO TABS: 5.0000 mg | ORAL_TABLET | Freq: Every day | ORAL | 3 refills | Status: DC

## 2016-06-16 MED ORDER — FLUOXETINE HCL 10 MG PO CAPS: 10.0000 mg | ORAL_CAPSULE | Freq: Every day | ORAL | 3 refills | Status: DC

## 2016-06-16 NOTE — Assessment & Plan Note (Signed)
For depression will continue current medications

## 2016-06-16 NOTE — Progress Notes (Signed)
BP (!) 162/77 (BP Location: Left Arm, Patient Position: Sitting, Cuff Size: Normal)   Pulse (!) 55   Temp 98.2 F (36.8 C)   Wt 122 lb 12.8 oz (55.7 kg)   LMP  (LMP Unknown)   SpO2 99%   BMI 21.08 kg/m    Subjective:    Patient ID: Monique Tucker, female    DOB: 1961-05-10, 55 y.o.   MRN: [id number]  HPI: Monique Tucker is a 55 y.o. female  Chief Complaint  Patient presents with  . Hypertension  Reviewed with patient blood pressure may be a little better feels better off lisinopril shakes of stopped taking double dose metoprolol and doing well with no extra fatigue effects pressure still elevated. Patient's nerves anxiety may be slightly improved no increased symptoms of anxiety or agitation. Depression seems to be lifting slightly. Sleep is doing okay nothing great but that's no change.  Relevant past medical, surgical, family and social history reviewed and updated as indicated. Interim medical history since our last visit reviewed. Allergies and medications reviewed and updated.  Review of Systems  Constitutional: Negative.   Respiratory: Negative.   Cardiovascular: Negative.     Per HPI unless specifically indicated above     Objective:    BP (!) 162/77 (BP Location: Left Arm, Patient Position: Sitting, Cuff Size: Normal)   Pulse (!) 55   Temp 98.2 F (36.8 C)   Wt 122 lb 12.8 oz (55.7 kg)   LMP  (LMP Unknown)   SpO2 99%   BMI 21.08 kg/m   Wt Readings from Last 3 Encounters:  06/16/16 122 lb 12.8 oz (55.7 kg)  06/15/16 123 lb 9 oz (56 kg)  06/06/16 120 lb 3.2 oz (54.5 kg)    Physical Exam  Constitutional: She is oriented to person, place, and time. She appears well-developed and well-nourished. No distress.  HENT:  Head: Normocephalic and atraumatic.  Right Ear: Hearing normal.  Left Ear: Hearing normal.  Nose: Nose normal.  Eyes: Conjunctivae and lids are normal. Right eye exhibits no discharge. Left eye exhibits no discharge. No scleral icterus.    Cardiovascular: Normal rate and regular rhythm.   Pulmonary/Chest: Effort normal and breath sounds normal. No respiratory distress.  Musculoskeletal: Normal range of motion.  Neurological: She is alert and oriented to person, place, and time.  Skin: Skin is intact. No rash noted.  Psychiatric: She has a normal mood and affect. Her speech is normal and behavior is normal. Judgment and thought content normal. Cognition and memory are normal.    Results for orders placed or performed in visit on 06/06/16  Microscopic Examination  Result Value Ref Range   WBC, UA 0-5 0 - 5 /hpf   RBC, UA None seen 0 - 2 /hpf   Epithelial Cells (non renal) 0-10 0 - 10 /hpf   Mucus, UA Present (A) Not Estab.   Bacteria, UA Few (A) None seen/Few  Basic metabolic panel  Result Value Ref Range   Glucose 116 (H) 65 - 99 mg/dL   BUN 28 (H) 6 - 24 mg/dL   Creatinine, Ser 1.53 (H) 0.57 - 1.00 mg/dL   GFR calc non Af Amer 38 (L) >59 mL/min/1.73   GFR calc Af Amer 44 (L) >59 mL/min/1.73   BUN/Creatinine Ratio 18 9 - 23   Sodium 137 134 - 144 mmol/L   Potassium 5.2 3.5 - 5.2 mmol/L   Chloride 98 96 - 106 mmol/L   CO2 22 18 - 29  mmol/L   Calcium 10.9 (H) 8.7 - 10.2 mg/dL  Urinalysis, Routine w reflex microscopic  Result Value Ref Range   Specific Gravity, UA 1.020 1.005 - 1.030   pH, UA 5.0 5.0 - 7.5   Color, UA Yellow Yellow   Appearance Ur Clear Clear   Leukocytes, UA Trace (A) Negative   Protein, UA Negative Negative/Trace   Glucose, UA Negative Negative   Ketones, UA 1+ (A) Negative   RBC, UA Trace (A) Negative   Bilirubin, UA Negative Negative   Urobilinogen, Ur 0.2 0.2 - 1.0 mg/dL   Nitrite, UA Negative Negative   Microscopic Examination See below:       Assessment & Plan:   Problem List Items Addressed This Visit      Cardiovascular and Mediastinum   Hypertension    Discuss hypertension continued poor control on metoprolol will continue metoprolol and add amlodipine 5 mg Patient education  given about potential side effects of edema.      Relevant Medications   amLODipine (NORVASC) 5 MG tablet     Other   Depression    For depression will continue current medications      Relevant Medications   FLUoxetine (PROZAC) 10 MG capsule       Follow up plan: Return in about 4 weeks (around 07/14/2016) for BMP.

## 2016-06-16 NOTE — Assessment & Plan Note (Signed)
Discuss hypertension continued poor control on metoprolol will continue metoprolol and add amlodipine 5 mg Patient education given about potential side effects of edema.

## 2016-06-29 ENCOUNTER — Ambulatory Visit: Payer: PRIVATE HEALTH INSURANCE | Admitting: Family Medicine

## 2016-07-08 ENCOUNTER — Ambulatory Visit (INDEPENDENT_AMBULATORY_CARE_PROVIDER_SITE_OTHER): Payer: PRIVATE HEALTH INSURANCE | Admitting: Podiatry

## 2016-07-08 DIAGNOSIS — M722 Plantar fascial fibromatosis: Secondary | ICD-10-CM

## 2016-07-08 DIAGNOSIS — M79673 Pain in unspecified foot: Secondary | ICD-10-CM | POA: Diagnosis not present

## 2016-07-08 DIAGNOSIS — M79671 Pain in right foot: Secondary | ICD-10-CM

## 2016-07-08 DIAGNOSIS — M79672 Pain in left foot: Secondary | ICD-10-CM | POA: Diagnosis not present

## 2016-07-08 DIAGNOSIS — L608 Other nail disorders: Secondary | ICD-10-CM

## 2016-07-13 MED ORDER — BETAMETHASONE SOD PHOS & ACET 6 (3-3) MG/ML IJ SUSP: 3.0000 mg | Freq: Once | INTRAMUSCULAR | Status: AC

## 2016-07-13 NOTE — Progress Notes (Signed)
Subjective: Patient presents today for follow-up evaluation of plantar fasciitis to the bilateral feet. Patient states that she's still having bilateral foot pain. She also states that her great toes are so sore. Patient states that the compounding cream is not helping.  Objective: Physical Exam General: The patient is alert and oriented x3 in no acute distress.  Dermatology: Incurvated nails noted to the medial aspect of the bilateral great toes. Partial permanent toenail avulsion appears to have completely healed however she still experiences pain on palpation to the medial aspect of the bilateral great toenails. Skin is warm, dry and supple bilateral lower extremities. Negative for open lesions or macerations bilateral.   Vascular: Dorsalis Pedis and Posterior Tibial pulses palpable bilateral.  Capillary fill time is immediate to all digits.  Neurological: Epicritic and protective threshold intact bilateral.   Musculoskeletal: Tenderness to palpation at the medial calcaneal tubercale and through the insertion of the plantar fascia of the bilateral feet. All other joints range of motion within normal limits bilateral. Strength 5/5 in all groups bilateral.    Assessment: #1 plantar fasciitis bilateral feet #2 pain in bilateral feet #3 pain in bilateral great toenails #4 incurvated toenails medial aspects of bilateral great toes  Plan of Care:  1. Patient evaluated. Xrays reviewed.   2. Injection of 0.5cc Celestone soluspan injected into the bilateral heels.  3. Continue conservative therapies and modalities at home to alleviate symptoms.  4. Today we discussed additional modalities including physical therapy, EPAT shockwave therapy, and surgical intervention to alleviate chronic plantar fasciitis.  5. We will get these injections one more chance to see if it alleviate symptoms. - No more injections after today due to nonprogressive nature of the symptoms  6. Discuss toenails next Visit   7. Return to clinic in 4 weeks.

## 2016-07-14 ENCOUNTER — Ambulatory Visit: Payer: PRIVATE HEALTH INSURANCE | Admitting: Family Medicine

## 2016-07-18 ENCOUNTER — Telehealth: Payer: Self-pay | Admitting: Family Medicine

## 2016-07-18 NOTE — Telephone Encounter (Signed)
That is fine. Thanks 

## 2016-07-18 NOTE — Telephone Encounter (Signed)
Pt medication was adjusted on 06/06/16. Toprol-XL was increased from 50 mg to 100 mg. Pt is feeling increased fatigue, she was hoping it would go away, but it has persisted. Pt would like to know if she can take 1/2 a tablet until she returns next week to see Dr. Jeananne Rama.   Pt was also started on Norvasc at the same visit.   Please advise.

## 2016-07-18 NOTE — Telephone Encounter (Signed)
Message relayed to patient. Verbalized understanding and denied questions.   

## 2016-07-28 ENCOUNTER — Ambulatory Visit (INDEPENDENT_AMBULATORY_CARE_PROVIDER_SITE_OTHER): Payer: PRIVATE HEALTH INSURANCE | Admitting: Family Medicine

## 2016-07-28 ENCOUNTER — Encounter: Payer: Self-pay | Admitting: Family Medicine

## 2016-07-28 VITALS — BP 151/78 | HR 59 | Ht 64.0 in | Wt 122.0 lb

## 2016-07-28 DIAGNOSIS — F319 Bipolar disorder, unspecified: Secondary | ICD-10-CM

## 2016-07-28 DIAGNOSIS — I10 Essential (primary) hypertension: Secondary | ICD-10-CM

## 2016-07-28 MED ORDER — HYDROCHLOROTHIAZIDE 25 MG PO TABS: 25.0000 mg | ORAL_TABLET | Freq: Every day | ORAL | 2 refills | Status: DC

## 2016-07-28 NOTE — Progress Notes (Signed)
BP (!) 151/78   Pulse (!) 59   Ht 5\' 4"  (1.626 m)   Wt 122 lb (55.3 kg)   LMP  (LMP Unknown)   SpO2 99%   BMI 20.94 kg/m    Subjective:    Patient ID: Monique Tucker, female    DOB: 06/14/1961, 56 y.o.   MRN: [id number]  HPI: Monique Tucker is a 55 y.o. female  Chief Complaint  Patient presents with  . Follow-up  . Hypertension  Patient follow-up hypertension and fatigue. Because of fatigue concerns as a side effect of metoprolol at 100 mg Back to 50 mg. Patient previously been on 50 mg of metoprolol without problems or fatigue. Patient taking amlodipine with no side effects. Was unable to take lisinopril due to shakiness and nerves. Which is resolved with stopping medication. Blood pressures remained up as has fatigue in spite of modifying medications. Patient gets adequate sleep and nutrition depression nerves are stable. Patient's ongoing fatigue not aware of any sleep issues as lives alone but will be with her sister next weekend who can observe for sleep apnea symptoms or signs. Patient was stopped Naprosyn and other medications for arthritis tissues and occasional Tylenol.  Relevant past medical, surgical, family and social history reviewed and updated as indicated. Interim medical history since our last visit reviewed. Allergies and medications reviewed and updated.  Review of Systems  Constitutional: Positive for fatigue. Negative for diaphoresis and fever.  Respiratory: Negative.   Cardiovascular: Negative.     Per HPI unless specifically indicated above     Objective:    BP (!) 151/78   Pulse (!) 59   Ht 5\' 4"  (1.626 m)   Wt 122 lb (55.3 kg)   LMP  (LMP Unknown)   SpO2 99%   BMI 20.94 kg/m   Wt Readings from Last 3 Encounters:  07/28/16 122 lb (55.3 kg)  06/16/16 122 lb 12.8 oz (55.7 kg)  06/15/16 123 lb 9 oz (56 kg)    Physical Exam  Constitutional: She is oriented to person, place, and time. She appears well-developed and well-nourished. No  distress.  HENT:  Head: Normocephalic and atraumatic.  Right Ear: Hearing normal.  Left Ear: Hearing normal.  Nose: Nose normal.  Eyes: Conjunctivae and lids are normal. Right eye exhibits no discharge. Left eye exhibits no discharge. No scleral icterus.  Cardiovascular: Normal rate, regular rhythm and normal heart sounds.   Pulmonary/Chest: Effort normal and breath sounds normal. No respiratory distress.  Musculoskeletal: Normal range of motion.  Neurological: She is alert and oriented to person, place, and time.  Skin: Skin is intact. No rash noted.  Psychiatric: She has a normal mood and affect. Her speech is normal and behavior is normal. Judgment and thought content normal. Cognition and memory are normal.    Results for orders placed or performed in visit on 06/06/16  Microscopic Examination  Result Value Ref Range   WBC, UA 0-5 0 - 5 /hpf   RBC, UA None seen 0 - 2 /hpf   Epithelial Cells (non renal) 0-10 0 - 10 /hpf   Mucus, UA Present (A) Not Estab.   Bacteria, UA Few (A) None seen/Few  Basic metabolic panel  Result Value Ref Range   Glucose 116 (H) 65 - 99 mg/dL   BUN 28 (H) 6 - 24 mg/dL   Creatinine, Ser 1.53 (H) 0.57 - 1.00 mg/dL   GFR calc non Af Amer 38 (L) >59 mL/min/1.73   GFR calc Af  Amer 44 (L) >59 mL/min/1.73   BUN/Creatinine Ratio 18 9 - 23   Sodium 137 134 - 144 mmol/L   Potassium 5.2 3.5 - 5.2 mmol/L   Chloride 98 96 - 106 mmol/L   CO2 22 18 - 29 mmol/L   Calcium 10.9 (H) 8.7 - 10.2 mg/dL  Urinalysis, Routine w reflex microscopic  Result Value Ref Range   Specific Gravity, UA 1.020 1.005 - 1.030   pH, UA 5.0 5.0 - 7.5   Color, UA Yellow Yellow   Appearance Ur Clear Clear   Leukocytes, UA Trace (A) Negative   Protein, UA Negative Negative/Trace   Glucose, UA Negative Negative   Ketones, UA 1+ (A) Negative   RBC, UA Trace (A) Negative   Bilirubin, UA Negative Negative   Urobilinogen, Ur 0.2 0.2 - 1.0 mg/dL   Nitrite, UA Negative Negative    Microscopic Examination See below:       Assessment & Plan:   Problem List Items Addressed This Visit      Cardiovascular and Mediastinum   Hypertension - Primary    Poor control of blood pressure will continue metoprolol 50 mg and amlodipine 5 mg not increase amlodipine because of concern about foot swelling. Will add hydrochlorothiazide 25 mg recheck blood pressure 1 month along with BMP again.      Relevant Medications   hydrochlorothiazide (HYDRODIURIL) 25 MG tablet   Other Relevant Orders   Basic metabolic panel     Other   Bipolar 1 disorder ([place])    The current medical regimen is effective;  continue present plan and medications.         Discussed renal function and Naprosyn patient staying off the Naprosyn BMP pending will get that back next week. Discuss fatigue of unknown cause possibly combination of multiple factors will start by treating blood pressure better observe for sleep apnea and check renal function today.  Follow up plan: Return in about 4 weeks (around 08/25/2016) for BMP.

## 2016-07-28 NOTE — Assessment & Plan Note (Signed)
The current medical regimen is effective;  continue present plan and medications.  

## 2016-07-28 NOTE — Assessment & Plan Note (Signed)
Poor control of blood pressure will continue metoprolol 50 mg and amlodipine 5 mg not increase amlodipine because of concern about foot swelling. Will add hydrochlorothiazide 25 mg recheck blood pressure 1 month along with BMP again.

## 2016-07-29 LAB — BASIC METABOLIC PANEL
BUN/Creatinine Ratio: 14 (ref 9–23)
BUN: 11 mg/dL (ref 6–24)
CALCIUM: 9.4 mg/dL (ref 8.7–10.2)
CO2: 26 mmol/L (ref 18–29)
CREATININE: 0.76 mg/dL (ref 0.57–1.00)
Chloride: 102 mmol/L (ref 96–106)
GFR calc Af Amer: 101 mL/min/{1.73_m2} (ref >59)
GFR, EST NON AFRICAN AMERICAN: 88 mL/min/{1.73_m2} (ref >59)
GLUCOSE: 128 mg/dL — AB (ref 65–99)
POTASSIUM: 4.7 mmol/L (ref 3.5–5.2)
SODIUM: 142 mmol/L (ref 134–144)

## 2016-08-01 ENCOUNTER — Encounter: Payer: Self-pay | Admitting: Family Medicine

## 2016-08-04 ENCOUNTER — Ambulatory Visit: Payer: PRIVATE HEALTH INSURANCE | Admitting: Podiatry

## 2016-08-09 ENCOUNTER — Other Ambulatory Visit: Payer: Self-pay

## 2016-08-09 DIAGNOSIS — J449 Chronic obstructive pulmonary disease, unspecified: Secondary | ICD-10-CM

## 2016-08-09 MED ORDER — BUDESONIDE-FORMOTEROL FUMARATE 160-4.5 MCG/ACT IN AERO: 2.0000 | INHALATION_SPRAY | Freq: Two times a day (BID) | RESPIRATORY_TRACT | 2 refills | Status: DC

## 2016-08-11 ENCOUNTER — Ambulatory Visit: Payer: PRIVATE HEALTH INSURANCE | Admitting: Podiatry

## 2016-08-12 ENCOUNTER — Ambulatory Visit (INDEPENDENT_AMBULATORY_CARE_PROVIDER_SITE_OTHER): Payer: PRIVATE HEALTH INSURANCE | Admitting: Podiatry

## 2016-08-12 DIAGNOSIS — M79672 Pain in left foot: Secondary | ICD-10-CM | POA: Diagnosis not present

## 2016-08-12 DIAGNOSIS — M79671 Pain in right foot: Secondary | ICD-10-CM | POA: Diagnosis not present

## 2016-08-12 DIAGNOSIS — M722 Plantar fascial fibromatosis: Secondary | ICD-10-CM | POA: Diagnosis not present

## 2016-08-12 DIAGNOSIS — M79673 Pain in unspecified foot: Secondary | ICD-10-CM

## 2016-08-14 NOTE — Progress Notes (Signed)
Subjective: Patient presents today for follow-up evaluation of plantar fasciitis to the bilateral feet. Patient states that she's still having bilateral foot pain. Patient states that all conservative modalities including stretching and shoe gear modifications are not helping to alleviate symptoms. Topical anti-inflammatory pain cream is also not helping to alleviate symptoms.   Objective: Physical Exam General: The patient is alert and oriented x3 in no acute distress.  Dermatology: Incurvated nails noted to the medial aspect of the bilateral great toes. Partial permanent toenail avulsion appears to have completely healed however she still experiences pain on palpation to the medial aspect of the bilateral great toenails. Skin is warm, dry and supple bilateral lower extremities. Negative for open lesions or macerations bilateral.   Vascular: Dorsalis Pedis and Posterior Tibial pulses palpable bilateral.  Capillary fill time is immediate to all digits.  Neurological: Epicritic and protective threshold intact bilateral.   Musculoskeletal: Tenderness to palpation at the medial calcaneal tubercale and through the insertion of the plantar fascia of the bilateral feet. All other joints range of motion within normal limits bilateral. Strength 5/5 in all groups bilateral.    Assessment: #1 plantar fasciitis bilateral feet #2 pain in bilateral feet #3 pain in bilateral great toenails #4 incurvated toenails medial aspects of bilateral great toes  Plan of Care:  1. Patient evaluated. Xrays reviewed.   2. Today we continued the discussion of possible physical therapy and EPAT shockwave therapy. The patient would like to pursue possible physical therapy to see if there is any alleviation of symptoms during the course of treatment. 3. Orders for physical therapy placed today. Patient would prefer [facility] physical therapy. 4. Return to clinic in 4 weeks

## 2016-08-15 ENCOUNTER — Telehealth: Payer: Self-pay | Admitting: *Deleted

## 2016-08-15 DIAGNOSIS — M722 Plantar fascial fibromatosis: Secondary | ICD-10-CM

## 2016-08-15 NOTE — Telephone Encounter (Addendum)
-----   Message from Edrick Kins, DPM sent at 08/12/2016  4:29 PM EST ----- Regarding: Physical therapy Please arrange physical therapy for patient. Patient prefers [facility].  3x/week x 4 weeks.   Dx : plantar fasciitis bilateral  Thanks, Dr. Amalia Hailey. 08/15/2016-Required form and demographics faxed to [facility].

## 2016-08-25 ENCOUNTER — Ambulatory Visit: Payer: PRIVATE HEALTH INSURANCE | Admitting: Family Medicine

## 2016-09-13 ENCOUNTER — Encounter: Payer: Self-pay | Admitting: Podiatry

## 2016-09-13 ENCOUNTER — Ambulatory Visit (INDEPENDENT_AMBULATORY_CARE_PROVIDER_SITE_OTHER): Payer: PRIVATE HEALTH INSURANCE | Admitting: Podiatry

## 2016-09-13 DIAGNOSIS — M722 Plantar fascial fibromatosis: Secondary | ICD-10-CM | POA: Diagnosis not present

## 2016-09-13 DIAGNOSIS — M79671 Pain in right foot: Secondary | ICD-10-CM

## 2016-09-13 DIAGNOSIS — M79673 Pain in unspecified foot: Secondary | ICD-10-CM | POA: Diagnosis not present

## 2016-09-13 DIAGNOSIS — M79672 Pain in left foot: Secondary | ICD-10-CM

## 2016-09-20 ENCOUNTER — Encounter: Payer: Self-pay | Admitting: Family Medicine

## 2016-09-20 ENCOUNTER — Ambulatory Visit (INDEPENDENT_AMBULATORY_CARE_PROVIDER_SITE_OTHER): Payer: PRIVATE HEALTH INSURANCE | Admitting: Family Medicine

## 2016-09-20 VITALS — BP 150/89 | HR 85 | Temp 98.4°F | Wt 123.0 lb

## 2016-09-20 DIAGNOSIS — J111 Influenza due to unidentified influenza virus with other respiratory manifestations: Secondary | ICD-10-CM | POA: Diagnosis not present

## 2016-09-20 DIAGNOSIS — J209 Acute bronchitis, unspecified: Secondary | ICD-10-CM

## 2016-09-20 DIAGNOSIS — J42 Unspecified chronic bronchitis: Secondary | ICD-10-CM

## 2016-09-20 LAB — VERITOR FLU A/B WAIVED
INFLUENZA B: NEGATIVE
Influenza A: NEGATIVE

## 2016-09-20 MED ORDER — OSELTAMIVIR PHOSPHATE 75 MG PO CAPS: 75.0000 mg | ORAL_CAPSULE | Freq: Two times a day (BID) | ORAL | 0 refills | Status: DC

## 2016-09-20 MED ORDER — AZITHROMYCIN 250 MG PO TABS: ORAL_TABLET | ORAL | 0 refills | Status: DC

## 2016-09-20 NOTE — Assessment & Plan Note (Addendum)
From current URI, will start zpak. Continue inhalers. Follow up if no improvement in the next few days. Discussed smoking cessation, pt unwilling to quit at this time but agreed to cut back until feeling much better.

## 2016-09-20 NOTE — Progress Notes (Signed)
BP (!) 150/89   Pulse 85   Temp 98.4 F (36.9 C)   Wt 123 lb (55.8 kg)   LMP  (LMP Unknown)   SpO2 96%   BMI 21.11 kg/m    Subjective:    Patient ID: Monique Tucker, female    DOB: 09-07-1960, 56 y.o.   MRN: [id number]  HPI: Monique Tucker is a 56 y.o. female  Chief Complaint  Patient presents with  . Sinusitis    x 3 days, head/chest congestion, body aches, nausea, diarrhea, ears ache, sore throat, productive cough. No known fever.    Sudden onset fatigue, nausea, body aches, congestion, diarrhea, ear pain, severe HA, productive cough, SOB x 3 days. Denies known fever, facial pain, vomiting. Taking zyrtec, tylenol, salt water gargles, neti pot with no relief. 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 Hx of COPD, current smoker not looking to quit.   Past Medical History:  Diagnosis Date  . Acute hepatitis C   . Anxiety   . AR (allergic rhinitis)   . Arthritis   . Bulging lumbar disc   . COPD (chronic obstructive pulmonary disease) (HCC)    mild  . Depression   . Hypertension   . Plantar fasciitis   . Restless leg syndrome, controlled    Social History   Social History  . Marital status: Single    Spouse name: N/A  . Number of children: N/A  . Years of education: N/A   Occupational History  . Not on file.   Social History Main Topics  . Smoking status: Current Every Day Smoker    Packs/day: 1.00    Types: Cigarettes  . Smokeless tobacco: Never Used     Comment: patient has been Rx nicotine patch  . Alcohol use Yes     Comment: occassional  . Drug use: No  . Sexual activity: No   Other Topics Concern  . Not on file   Social History Narrative  . No narrative on file    Relevant past medical, surgical, family and social history reviewed and updated as indicated. Interim medical history since our last visit reviewed. Allergies and medications reviewed and updated.  Review of Systems  Constitutional: Positive for fatigue.  HENT:  Positive for congestion, ear pain and sore throat.   Eyes: Negative.   Respiratory: Positive for cough and shortness of breath.   Cardiovascular: Negative.   Gastrointestinal: Positive for diarrhea and nausea.  Genitourinary: Negative.   Musculoskeletal: Positive for myalgias.  Neurological: Positive for headaches.  Psychiatric/Behavioral: Negative.     Per HPI unless specifically indicated above     Objective:    BP (!) 150/89   Pulse 85   Temp 98.4 F (36.9 C)   Wt 123 lb (55.8 kg)   LMP  (LMP Unknown)   SpO2 96%   BMI 21.11 kg/m   Wt Readings from Last 3 Encounters:  09/20/16 123 lb (55.8 kg)  07/28/16 122 lb (55.3 kg)  06/16/16 122 lb 12.8 oz (55.7 kg)    Physical Exam  Constitutional: She is oriented to person, place, and time. She appears well-developed and well-nourished. No distress.  HENT:  Head: Atraumatic.  Right Ear: External ear normal.  Left Ear: External ear normal.  Rhinorrhea present Oropharynx erythematous  Eyes: Conjunctivae are normal. Pupils are equal, round, and reactive to light. No scleral icterus.  Neck: Normal range of motion. Neck supple.  Cardiovascular: Normal rate.   Pulmonary/Chest: Effort normal.  No respiratory distress. She has wheezes (minimal wheezes). She has no rales.  Musculoskeletal: Normal range of motion.  Neurological: She is alert and oriented to person, place, and time.  Skin: Skin is warm and dry.  Psychiatric: She has a normal mood and affect. Her behavior is normal.  Nursing note and vitals reviewed.     Assessment & Plan:   Problem List Items Addressed This Visit      Respiratory   Acute exacerbation of chronic bronchitis (HCC)    From current URI, will start zpak. Continue inhalers. Follow up if no improvement in the next few days. Discussed smoking cessation, pt unwilling to quit at this time but agreed to cut back until feeling much better.       Relevant Medications   azithromycin (ZITHROMAX) 250 MG tablet      Other Visit Diagnoses    Influenza    -  Primary   Rapid flu neg, but classic sxs. Will start tamiflu despite being 1 day out of window given severity of sxs. Supportive care discussed. F/u if no improvement   Relevant Medications   oseltamivir (TAMIFLU) 75 MG capsule   azithromycin (ZITHROMAX) 250 MG tablet   Other Relevant Orders   Influenza A & B (STAT) (Completed)       Follow up plan: Return if symptoms worsen or fail to improve.

## 2016-09-20 NOTE — Patient Instructions (Signed)
Follow up if no improvement 

## 2016-09-21 NOTE — Progress Notes (Signed)
Subjective: Patient presents today for follow-up evaluation of plantar fasciitis to the bilateral feet. Patient states that she's still having bilateral foot pain. Patient states that the physical therapy helped. She has finished her physical therapy she states that it was very aggressive but did help to alleviate symptoms.Patient  States that she is improved.  Objective: Physical Exam General: The patient is alert and oriented x3 in no acute distress.  Dermatology: Incurvated nails noted to the medial aspect of the bilateral great toes. Partial permanent toenail avulsion appears to have completely healed however she still experiences pain on palpation to the medial aspect of the bilateral great toenails. Skin is warm, dry and supple bilateral lower extremities. Negative for open lesions or macerations bilateral.   Vascular: Dorsalis Pedis and Posterior Tibial pulses palpable bilateral.  Capillary fill time is immediate to all digits.  Neurological: Epicritic and protective threshold intact bilateral.   Musculoskeletal: Tenderness to palpation at the medial calcaneal tubercale and through the insertion of the plantar fascia of the bilateral feet. All other joints range of motion within normal limits bilateral. Strength 5/5 in all groups bilateral.    Assessment: #1 plantar fasciitis bilateral feet #2 pain in bilateral feet #3 pain in bilateral great toenails #4 incurvated toenails medial aspects of bilateral great toes  Plan of Care:  1. Patient evaluated.  2. Today we continued the discussion of possible  EPAT shockwave therapy. The patient states that at the moment she has high medical bills that she is trying to pay off. Patient is unable to proceed with shockwave therapy due to cost 3. Return to clinic when necessary

## 2016-09-23 ENCOUNTER — Telehealth: Payer: Self-pay | Admitting: Family Medicine

## 2016-09-23 ENCOUNTER — Other Ambulatory Visit: Payer: Self-pay | Admitting: Family Medicine

## 2016-09-23 MED ORDER — PREDNISONE 20 MG PO TABS: 40.0000 mg | ORAL_TABLET | Freq: Every day | ORAL | 0 refills | Status: DC

## 2016-09-23 NOTE — Telephone Encounter (Signed)
Routing to provider for advice.

## 2016-09-23 NOTE — Telephone Encounter (Signed)
Spoke with pt, improving some with the zpak but still having a good amount of wheezing and facial pain/pressure. Sent over prednisone burst and discussed completing the zpak as well as using zyrtec, flonase, plain mucinex, and neti pot. Recommended humidifiers in bedroom. F/u if worsening or no improvement by Monday

## 2016-10-03 ENCOUNTER — Telehealth: Payer: Self-pay | Admitting: Family Medicine

## 2016-10-03 MED ORDER — NYSTATIN 100000 UNIT/ML MT SUSP: 5.0000 mL | Freq: Four times a day (QID) | OROMUCOSAL | 0 refills | Status: DC

## 2016-10-03 NOTE — Telephone Encounter (Signed)
Left message for patient that a rx was sent in for her.

## 2016-10-03 NOTE — Telephone Encounter (Signed)
Routing to provider  

## 2016-10-03 NOTE — Telephone Encounter (Signed)
Patient has been on antibiotic and now thinks she has developed thrush in the mouth.  Can you call her in something for this or if not can you give her a call.  Thanks  [phone]   [facility]

## 2016-10-03 NOTE — Telephone Encounter (Signed)
Sent in some nystatin mouthwash for her suspected thrush

## 2016-10-04 ENCOUNTER — Other Ambulatory Visit: Payer: Self-pay | Admitting: Family Medicine

## 2016-10-28 ENCOUNTER — Other Ambulatory Visit: Payer: Self-pay | Admitting: Family Medicine

## 2016-10-29 ENCOUNTER — Other Ambulatory Visit: Payer: Self-pay | Admitting: Family Medicine

## 2016-10-31 NOTE — Telephone Encounter (Signed)
apt 

## 2016-11-03 ENCOUNTER — Encounter: Payer: Self-pay | Admitting: Family Medicine

## 2016-11-03 ENCOUNTER — Ambulatory Visit (INDEPENDENT_AMBULATORY_CARE_PROVIDER_SITE_OTHER): Payer: PRIVATE HEALTH INSURANCE | Admitting: Family Medicine

## 2016-11-03 VITALS — BP 167/79 | HR 64 | Temp 98.5°F | Wt 122.0 lb

## 2016-11-03 DIAGNOSIS — I10 Essential (primary) hypertension: Secondary | ICD-10-CM | POA: Diagnosis not present

## 2016-11-03 DIAGNOSIS — F319 Bipolar disorder, unspecified: Secondary | ICD-10-CM

## 2016-11-03 MED ORDER — AMLODIPINE BESYLATE 5 MG PO TABS: 5.0000 mg | ORAL_TABLET | Freq: Every day | ORAL | 6 refills | Status: DC

## 2016-11-03 MED ORDER — METOPROLOL SUCCINATE ER 50 MG PO TB24: 50.0000 mg | ORAL_TABLET | Freq: Every day | ORAL | 6 refills | Status: DC

## 2016-11-03 MED ORDER — QUETIAPINE FUMARATE ER 150 MG PO TB24: 150.0000 mg | ORAL_TABLET | Freq: Every day | ORAL | 6 refills | Status: DC

## 2016-11-03 MED ORDER — HYDROCHLOROTHIAZIDE 25 MG PO TABS: 25.0000 mg | ORAL_TABLET | Freq: Every day | ORAL | 6 refills | Status: DC

## 2016-11-03 NOTE — Progress Notes (Signed)
BP (!) 167/79   Pulse 64   Temp 98.5 F (36.9 C)   Wt 122 lb (55.3 kg)   LMP  (LMP Unknown)   SpO2 96%   BMI 20.94 kg/m    Subjective:    Patient ID: Monique Tucker, female    DOB: 12-Jun-1961, 56 y.o.   MRN: [id number]  HPI: Monique Tucker is a 56 y.o. female  Chief Complaint  Patient presents with  . Hypertension    needs refills on HCTZ and Amlodipine  . Medication Refill    refill on Seroquel.   Patient presents for BP follow up and refills. Taking her medicines faithfully. Does not check BP at home. Metoprolol was decreased at previous visit d/t fatigue concerns. Did not tolerate lisinopril so HCTZ was started. Denies side effects with current medicines.   Patient also wanting refill on her seroquel. Having fair improvement with this medication, but still having significant anxiety and paranoid thoughts. Wanting to see Psychiatry as she has not seen anyone in a long time. Will call us back with providers for Psychiatry that are under her insurance plan.   Past Medical History:  Diagnosis Date  . Acute hepatitis C   . Anxiety   . AR (allergic rhinitis)   . Arthritis   . Bulging lumbar disc   . COPD (chronic obstructive pulmonary disease) (HCC)    mild  . Depression   . Hypertension   . Plantar fasciitis   . Restless leg syndrome, controlled    Social History   Social History  . Marital status: Single    Spouse name: N/A  . Number of children: N/A  . Years of education: N/A   Occupational History  . Not on file.   Social History Main Topics  . Smoking status: Current Every Day Smoker    Packs/day: 1.00    Types: Cigarettes  . Smokeless tobacco: Never Used     Comment: patient has been Rx nicotine patch  . Alcohol use Yes     Comment: occassional  . Drug use: No  . Sexual activity: No   Other Topics Concern  . Not on file   Social History Narrative  . No narrative on file   Relevant past medical, surgical, family and social history reviewed  and updated as indicated. Interim medical history since our last visit reviewed. Allergies and medications reviewed and updated.  Review of Systems  Constitutional: Negative.   HENT: Negative.   Eyes: Negative.   Respiratory: Negative.  Negative for shortness of breath.   Cardiovascular: Negative.  Negative for chest pain and palpitations.  Gastrointestinal: Negative.   Genitourinary: Negative.   Musculoskeletal: Negative.   Skin: Negative.   Neurological: Negative.  Negative for syncope and headaches.  Psychiatric/Behavioral: The patient is nervous/anxious.    Per HPI unless specifically indicated above     Objective:    BP (!) 167/79   Pulse 64   Temp 98.5 F (36.9 C)   Wt 122 lb (55.3 kg)   LMP  (LMP Unknown)   SpO2 96%   BMI 20.94 kg/m   Wt Readings from Last 3 Encounters:  11/03/16 122 lb (55.3 kg)  09/20/16 123 lb (55.8 kg)  07/28/16 122 lb (55.3 kg)    Physical Exam  Constitutional: She is oriented to person, place, and time. She appears well-developed and well-nourished. No distress.  HENT:  Head: Atraumatic.  Eyes: Conjunctivae are normal. Pupils are equal, round, and reactive to light.  Neck: Normal range of motion. Neck supple.  Cardiovascular: Normal rate and normal heart sounds.   Pulmonary/Chest: Effort normal and breath sounds normal. No respiratory distress.  Musculoskeletal: Normal range of motion.  Neurological: She is alert and oriented to person, place, and time.  Skin: Skin is warm and dry.  Psychiatric: She has a normal mood and affect. Her behavior is normal.  Nursing note and vitals reviewed.     Assessment & Plan:   Problem List Items Addressed This Visit      Cardiovascular and Mediastinum   Hypertension - Primary    BP not quite at goal, but pt wanting to try reducing her anxiety levels and working on DASH and lifestyle instead of increasing medications at this time. Will revisit at upcoming CPE. Recommended she check diligently at  home and let us know of abnormal readings. Lifestyle modifications discussed. Continue current regimen.       Relevant Medications   amLODipine (NORVASC) 5 MG tablet   hydrochlorothiazide (HYDRODIURIL) 25 MG tablet   metoprolol succinate (TOPROL-XL) 50 MG 24 hr tablet   Other Relevant Orders   Basic Metabolic Panel (BMET) (Completed)     Other   Bipolar 1 disorder ([place])    Continue current regimen. Call back with Psychiatry preference so we can place referral.           Follow up plan: Return in about 6 months (around 05/06/2017) for CPE, BP f/u.

## 2016-11-04 ENCOUNTER — Telehealth: Payer: Self-pay | Admitting: Family Medicine

## 2016-11-04 LAB — BASIC METABOLIC PANEL
BUN / CREAT RATIO: 17 (ref 9–23)
BUN: 12 mg/dL (ref 6–24)
CHLORIDE: 98 mmol/L (ref 96–106)
CO2: 27 mmol/L (ref 18–29)
Calcium: 9.3 mg/dL (ref 8.7–10.2)
Creatinine, Ser: 0.72 mg/dL (ref 0.57–1.00)
GFR calc non Af Amer: 94 mL/min/{1.73_m2} (ref >59)
GFR, EST AFRICAN AMERICAN: 108 mL/min/{1.73_m2} (ref >59)
Glucose: 102 mg/dL — ABNORMAL HIGH (ref 65–99)
POTASSIUM: 4.4 mmol/L (ref 3.5–5.2)
Sodium: 140 mmol/L (ref 134–144)

## 2016-11-04 NOTE — Telephone Encounter (Signed)
Patient notified

## 2016-11-04 NOTE — Telephone Encounter (Signed)
Please call pt and let her know that her labs came back normal

## 2016-11-11 NOTE — Assessment & Plan Note (Signed)
BP not quite at goal, but pt wanting to try reducing her anxiety levels and working on DASH and lifestyle instead of increasing medications at this time. Will revisit at upcoming CPE. Recommended she check diligently at home and let us know of abnormal readings. Lifestyle modifications discussed. Continue current regimen.

## 2016-11-11 NOTE — Assessment & Plan Note (Signed)
Continue current regimen. Call back with Psychiatry preference so we can place referral.

## 2016-12-05 ENCOUNTER — Telehealth: Payer: Self-pay | Admitting: Family Medicine

## 2016-12-05 ENCOUNTER — Ambulatory Visit: Payer: Self-pay | Admitting: Family Medicine

## 2016-12-05 NOTE — Telephone Encounter (Signed)
Patient called in regards to still experiencing sinus issues. Patient would like to know if she needs to come in for an appointment or if she can be prescribed a medication to help her.  Please Advise.  Thank you.

## 2016-12-05 NOTE — Telephone Encounter (Signed)
Needs appt

## 2016-12-05 NOTE — Telephone Encounter (Signed)
Patient notified, appt scheduled.

## 2016-12-05 NOTE — Telephone Encounter (Signed)
Routing to provider. She was seen for this on 09/20/16.

## 2016-12-15 ENCOUNTER — Other Ambulatory Visit: Payer: Self-pay | Admitting: Family Medicine

## 2016-12-15 NOTE — Telephone Encounter (Signed)
  Last routine OV: 11/03/16 Next OV: 05/10/17

## 2016-12-21 ENCOUNTER — Ambulatory Visit (INDEPENDENT_AMBULATORY_CARE_PROVIDER_SITE_OTHER): Payer: PRIVATE HEALTH INSURANCE | Admitting: Family Medicine

## 2016-12-21 ENCOUNTER — Encounter: Payer: Self-pay | Admitting: Family Medicine

## 2016-12-21 VITALS — BP 154/77 | HR 60 | Temp 98.4°F | Wt 123.0 lb

## 2016-12-21 DIAGNOSIS — J309 Allergic rhinitis, unspecified: Secondary | ICD-10-CM | POA: Diagnosis not present

## 2016-12-21 DIAGNOSIS — I10 Essential (primary) hypertension: Secondary | ICD-10-CM

## 2016-12-21 MED ORDER — CETIRIZINE HCL 10 MG PO TABS: 10.0000 mg | ORAL_TABLET | Freq: Every day | ORAL | 11 refills | Status: DC

## 2016-12-21 MED ORDER — FLUTICASONE PROPIONATE 50 MCG/ACT NA SUSP: 2.0000 | Freq: Every day | NASAL | 6 refills | Status: DC

## 2016-12-21 MED ORDER — MONTELUKAST SODIUM 10 MG PO TABS: 10.0000 mg | ORAL_TABLET | Freq: Every day | ORAL | 3 refills | Status: DC

## 2016-12-21 MED ORDER — GUAIFENESIN ER 600 MG PO TB12: 600.0000 mg | ORAL_TABLET | Freq: Two times a day (BID) | ORAL | 0 refills | Status: DC | PRN

## 2016-12-21 NOTE — Progress Notes (Signed)
   BP (!) 154/77   Pulse 60   Temp 98.4 F (36.9 C)   Wt 123 lb (55.8 kg)   LMP  (LMP Unknown)   SpO2 96%   BMI 21.11 kg/m    Subjective:    Patient ID: Monique Tucker, female    DOB: 1960/07/06, 56 y.o.   MRN: [id number]  HPI: Monique Tucker is a 56 y.o. female  Chief Complaint  Patient presents with  . Sinusitis    Called [facility] and got Prednisone and felt better for a week or 2.  Head congestion, productive cough, headache, runny nose, sinus drainage, some chest congestion.   Patient presents with over a month of congestion, rhinorrhea, sinus pressure, HA, scratchy throat, itchy ears, cough. Has this off and on throughout the year. Called [facility] several weeks ago and was given some prednisone which helped in the short term but sxs returned. Was previously taking zyrtec D but stopped the past few months. Tried some yesterday with no relief. Denies fever, chills, SOB, wheezing, sick contacts.   Relevant past medical, surgical, family and social history reviewed and updated as indicated. Interim medical history since our last visit reviewed. Allergies and medications reviewed and updated.  Review of Systems  Constitutional: Negative.   HENT: Positive for congestion, postnasal drip, rhinorrhea, sinus pressure and sore throat.   Eyes: Negative.   Respiratory: Positive for cough.   Cardiovascular: Negative.   Gastrointestinal: Negative.   Genitourinary: Negative.   Musculoskeletal: Negative.   Neurological: Positive for headaches.  Psychiatric/Behavioral: Negative.     Per HPI unless specifically indicated above     Objective:    BP (!) 154/77   Pulse 60   Temp 98.4 F (36.9 C)   Wt 123 lb (55.8 kg)   LMP  (LMP Unknown)   SpO2 96%   BMI 21.11 kg/m   Wt Readings from Last 3 Encounters:  12/21/16 123 lb (55.8 kg)  11/03/16 122 lb (55.3 kg)  09/20/16 123 lb (55.8 kg)    Physical Exam  Constitutional: She is oriented to person, place, and time. She appears  well-developed and well-nourished. No distress.  HENT:  Head: Atraumatic.  Right Ear: External ear normal.  Left Ear: External ear normal.  Nasal mucosa injected Oropharynx erythematous posteriorly  Eyes: Conjunctivae are normal. Pupils are equal, round, and reactive to light.  Neck: Normal range of motion. Neck supple.  Cardiovascular: Normal rate and normal heart sounds.   Pulmonary/Chest: Effort normal and breath sounds normal. No respiratory distress.  Neurological: She is alert and oriented to person, place, and time.  Skin: Skin is warm and dry.  Psychiatric: She has a normal mood and affect. Her behavior is normal.  Nursing note and vitals reviewed.     Assessment & Plan:   Problem List Items Addressed This Visit      Cardiovascular and Mediastinum   Hypertension    Discussed avoiding decongestant products as they can elevate BP readings. Plain mucinex and zyrtec recommended for her sxs.         Respiratory   AR (allergic rhinitis) - Primary    Suspect her persistent sxs are allergy related. Will start zyrtec, flonase, and singulair as plain zyrtec or claritin has not helped in the past. Can also use some plain mucinex as needed. F/u if no improvement.          Follow up plan: Return for as scheduled.

## 2016-12-22 DIAGNOSIS — J309 Allergic rhinitis, unspecified: Secondary | ICD-10-CM | POA: Insufficient documentation

## 2016-12-22 NOTE — Assessment & Plan Note (Signed)
Discussed avoiding decongestant products as they can elevate BP readings. Plain mucinex and zyrtec recommended for her sxs.

## 2016-12-22 NOTE — Assessment & Plan Note (Addendum)
Suspect her persistent sxs are allergy related. Will start zyrtec, flonase, and singulair as plain zyrtec or claritin has not helped in the past. Can also use some plain mucinex as needed. F/u if no improvement.

## 2017-02-20 ENCOUNTER — Ambulatory Visit (INDEPENDENT_AMBULATORY_CARE_PROVIDER_SITE_OTHER): Payer: PRIVATE HEALTH INSURANCE | Admitting: Family Medicine

## 2017-02-20 ENCOUNTER — Encounter: Payer: Self-pay | Admitting: Family Medicine

## 2017-02-20 VITALS — BP 166/89 | HR 59 | Temp 98.4°F | Wt 125.0 lb

## 2017-02-20 DIAGNOSIS — S39012A Strain of muscle, fascia and tendon of lower back, initial encounter: Secondary | ICD-10-CM | POA: Diagnosis not present

## 2017-02-20 DIAGNOSIS — M25559 Pain in unspecified hip: Secondary | ICD-10-CM

## 2017-02-20 DIAGNOSIS — G8929 Other chronic pain: Secondary | ICD-10-CM

## 2017-02-20 NOTE — Patient Instructions (Signed)
Try not to take any ibuprofen with the prednisone for those 5 days, as it can irritate your stomach lining.

## 2017-02-20 NOTE — Progress Notes (Signed)
BP (!) 166/89   Pulse (!) 59   Temp 98.4 F (36.9 C)   Wt 125 lb (56.7 kg)   LMP  (LMP Unknown)   SpO2 95%   BMI 21.46 kg/m    Subjective:    Patient ID: Monique Tucker, female    DOB: [birth year], 56 y.o.   MRN: [id number]  HPI: Monique Tucker is a 56 y.o. female  Chief Complaint  Patient presents with  . Hip Pain    x few months Both hips, but right is worst. No falls or known injury. Has taken Tramadol in the past and would like a refill.  . Back Pain    pulled a muscle in her lower back a few weeks ago.    Patient presents with chronic b/l hip pain, right > left x several months. Waking her up at night aching, stiff. Long hx of joint and muscle pains, has been told she may have fibromyalgia in the past. Taking tylenol with no relief. Used to frequently take ibuprofen but was told to stop d/t her renal function being affected previously. Previous labs reviewed, negative autoimmune work-up previously.  Also strained something in her low back at work a few weeks ago. Not getting much better after stretches and heating pads. Has had good relief with muscle relaxers in the past. Hx of disc issues, states this feels more muscular and not similar to that.   Past Medical History:  Diagnosis Date  . Acute hepatitis C   . Anxiety   . AR (allergic rhinitis)   . Arthritis   . Bulging lumbar disc   . COPD (chronic obstructive pulmonary disease) (HCC)    mild  . Depression   . Hypertension   . Plantar fasciitis   . Restless leg syndrome, controlled    Social History   Social History  . Marital status: Single    Spouse name: N/A  . Number of children: N/A  . Years of education: N/A   Occupational History  . Not on file.   Social History Main Topics  . Smoking status: Current Every Day Smoker    Packs/day: 1.00    Types: Cigarettes  . Smokeless tobacco: Never Used     Comment: patient has been Rx nicotine patch  . Alcohol use Yes     Comment: occassional  . Drug  use: No  . Sexual activity: No   Other Topics Concern  . Not on file   Social History Narrative  . No narrative on file   Relevant past medical, surgical, family and social history reviewed and updated as indicated. Interim medical history since our last visit reviewed. Allergies and medications reviewed and updated.  Review of Systems  Constitutional: Negative.   Respiratory: Negative.   Cardiovascular: Negative.   Gastrointestinal: Negative.   Genitourinary: Negative.   Musculoskeletal: Positive for arthralgias, back pain and myalgias.  Neurological: Negative.   Psychiatric/Behavioral: Negative.    Per HPI unless specifically indicated above     Objective:    BP (!) 166/89   Pulse (!) 59   Temp 98.4 F (36.9 C)   Wt 125 lb (56.7 kg)   LMP  (LMP Unknown)   SpO2 95%   BMI 21.46 kg/m   Wt Readings from Last 3 Encounters:  02/20/17 125 lb (56.7 kg)  12/21/16 123 lb (55.8 kg)  11/03/16 122 lb (55.3 kg)    Physical Exam  Constitutional: She is oriented to person, place, and time. She  appears well-developed and well-nourished. No distress.  HENT:  Head: Atraumatic.  Eyes: Pupils are equal, round, and reactive to light. Conjunctivae are normal.  Neck: Normal range of motion. Neck supple.  Cardiovascular: Normal rate and normal heart sounds.   Pulmonary/Chest: Effort normal and breath sounds normal. No respiratory distress.  Musculoskeletal: Normal range of motion. She exhibits tenderness (TTP over b/l lateral hips). She exhibits no edema or deformity.  - SLR, no spinal ttp or deformities Normal gait  Neurological: She is alert and oriented to person, place, and time.  Skin: Skin is warm and dry.  Psychiatric: She has a normal mood and affect. Her behavior is normal.  Nursing note and vitals reviewed.  Results for orders placed or performed in visit on 51/76/16  Basic Metabolic Panel (BMET)  Result Value Ref Range   Glucose 102 (H) 65 - 99 mg/dL   BUN 12 6 - 24  mg/dL   Creatinine, Ser 0.72 0.57 - 1.00 mg/dL   GFR calc non Af Amer 94 >59 mL/min/1.73   GFR calc Af Amer 108 >59 mL/min/1.73   BUN/Creatinine Ratio 17 9 - 23   Sodium 140 134 - 144 mmol/L   Potassium 4.4 3.5 - 5.2 mmol/L   Chloride 98 96 - 106 mmol/L   CO2 27 18 - 29 mmol/L   Calcium 9.3 8.7 - 10.2 mg/dL      Assessment & Plan:   Problem List Items Addressed This Visit      Other   Chronic hip pain - Primary    Arthritic vs fibromyalgia given superficial ttp and hx of multi-joint pain. Discussed trial of gabapentin, will start with 300 mg QHS and slowly titrate up as tolerated to 3 tablets daily. Continue OTC pain relievers, epsom salt soaks, stretches, low impact exercises      Relevant Medications   gabapentin (NEURONTIN) 300 MG capsule   predniSONE (DELTASONE) 20 MG tablet   cyclobenzaprine (FLEXERIL) 10 MG tablet    Other Visit Diagnoses    Strain of lumbar region, initial encounter       Prednisone burst,flexeril, heating pad, gentle stretches. Avoid heavy lifting for several weeks. F/u if worsening or no improvement       Follow up plan: Return in about 2 months (around 04/22/2017) for CPE.

## 2017-02-21 ENCOUNTER — Telehealth: Payer: Self-pay | Admitting: Family Medicine

## 2017-02-21 MED ORDER — PREDNISONE 20 MG PO TABS: 40.0000 mg | ORAL_TABLET | Freq: Every day | ORAL | 0 refills | Status: DC

## 2017-02-21 MED ORDER — GABAPENTIN 300 MG PO CAPS: 300.0000 mg | ORAL_CAPSULE | Freq: Three times a day (TID) | ORAL | 1 refills | Status: DC

## 2017-02-21 MED ORDER — CYCLOBENZAPRINE HCL 10 MG PO TABS: 10.0000 mg | ORAL_TABLET | Freq: Three times a day (TID) | ORAL | 0 refills | Status: DC | PRN

## 2017-02-21 NOTE — Telephone Encounter (Signed)
Routing to provider. No rx's were sent to her pharmacy.

## 2017-02-21 NOTE — Telephone Encounter (Signed)
Pended them instead of signing - sorry about that. They should be at [facility] now

## 2017-02-23 DIAGNOSIS — G8929 Other chronic pain: Secondary | ICD-10-CM | POA: Insufficient documentation

## 2017-02-23 DIAGNOSIS — M25559 Pain in unspecified hip: Secondary | ICD-10-CM

## 2017-02-23 NOTE — Assessment & Plan Note (Signed)
Arthritic vs fibromyalgia given superficial ttp and hx of multi-joint pain. Discussed trial of gabapentin, will start with 300 mg QHS and slowly titrate up as tolerated to 3 tablets daily. Continue OTC pain relievers, epsom salt soaks, stretches, low impact exercises

## 2017-03-31 ENCOUNTER — Other Ambulatory Visit: Payer: Self-pay | Admitting: Family Medicine

## 2017-05-05 ENCOUNTER — Other Ambulatory Visit: Payer: Self-pay | Admitting: Family Medicine

## 2017-05-10 ENCOUNTER — Encounter: Payer: PRIVATE HEALTH INSURANCE | Admitting: Family Medicine

## 2017-05-16 ENCOUNTER — Other Ambulatory Visit: Payer: Self-pay | Admitting: Obstetrics and Gynecology

## 2017-05-24 ENCOUNTER — Ambulatory Visit: Payer: PRIVATE HEALTH INSURANCE | Admitting: Family Medicine

## 2017-05-24 VITALS — BP 153/80 | HR 86 | Ht 65.35 in | Wt 130.0 lb

## 2017-05-24 DIAGNOSIS — R197 Diarrhea, unspecified: Secondary | ICD-10-CM

## 2017-05-24 DIAGNOSIS — Z0000 Encounter for general adult medical examination without abnormal findings: Secondary | ICD-10-CM

## 2017-05-24 DIAGNOSIS — Z1239 Encounter for other screening for malignant neoplasm of breast: Secondary | ICD-10-CM

## 2017-05-24 DIAGNOSIS — F319 Bipolar disorder, unspecified: Secondary | ICD-10-CM | POA: Diagnosis not present

## 2017-05-24 DIAGNOSIS — J309 Allergic rhinitis, unspecified: Secondary | ICD-10-CM | POA: Diagnosis not present

## 2017-05-24 DIAGNOSIS — I10 Essential (primary) hypertension: Secondary | ICD-10-CM | POA: Diagnosis not present

## 2017-05-24 DIAGNOSIS — N952 Postmenopausal atrophic vaginitis: Secondary | ICD-10-CM | POA: Diagnosis not present

## 2017-05-24 DIAGNOSIS — J449 Chronic obstructive pulmonary disease, unspecified: Secondary | ICD-10-CM | POA: Diagnosis not present

## 2017-05-24 DIAGNOSIS — Z23 Encounter for immunization: Secondary | ICD-10-CM

## 2017-05-24 LAB — UA/M W/RFLX CULTURE, ROUTINE
BILIRUBIN UA: NEGATIVE
Glucose, UA: NEGATIVE
Ketones, UA: NEGATIVE
Leukocytes, UA: NEGATIVE
NITRITE UA: NEGATIVE
PH UA: 5 (ref 5.0–7.5)
PROTEIN UA: NEGATIVE
RBC UA: NEGATIVE
UUROB: 0.2 mg/dL (ref 0.2–1.0)

## 2017-05-24 MED ORDER — ESTRADIOL 1 MG PO TABS: 1.0000 mg | ORAL_TABLET | Freq: Every day | ORAL | 3 refills | Status: DC

## 2017-05-24 MED ORDER — QUETIAPINE FUMARATE ER 150 MG PO TB24: 150.0000 mg | ORAL_TABLET | Freq: Every day | ORAL | 6 refills | Status: DC

## 2017-05-24 MED ORDER — TIOTROPIUM BROMIDE MONOHYDRATE 2.5 MCG/ACT IN AERS: 1.0000 | INHALATION_SPRAY | Freq: Every day | RESPIRATORY_TRACT | 6 refills | Status: DC

## 2017-05-24 MED ORDER — MONTELUKAST SODIUM 10 MG PO TABS: 10.0000 mg | ORAL_TABLET | Freq: Every day | ORAL | 11 refills | Status: DC

## 2017-05-24 MED ORDER — HYDROCHLOROTHIAZIDE 25 MG PO TABS: 25.0000 mg | ORAL_TABLET | Freq: Every day | ORAL | 6 refills | Status: DC

## 2017-05-24 MED ORDER — FLUOXETINE HCL 10 MG PO CAPS
10.0000 mg | ORAL_CAPSULE | Freq: Every day | ORAL | 6 refills | Status: DC
Start: 2017-05-24 — End: 2018-02-10

## 2017-05-24 MED ORDER — DICYCLOMINE HCL 10 MG PO CAPS: 10.0000 mg | ORAL_CAPSULE | Freq: Three times a day (TID) | ORAL | 3 refills | Status: DC

## 2017-05-24 MED ORDER — METOPROLOL SUCCINATE ER 50 MG PO TB24: 50.0000 mg | ORAL_TABLET | Freq: Every day | ORAL | 6 refills | Status: DC

## 2017-05-24 NOTE — Progress Notes (Signed)
BP (!) 153/80   Pulse 86   Ht 5' 5.35" (1.66 m)   Wt 130 lb (59 kg)   LMP  (LMP Unknown)   SpO2 94%   BMI 21.40 kg/m    Subjective:    Patient ID: Monique Tucker, female    DOB: [birth year], 56 y.o.   MRN: [id number]  HPI: Monique Tucker is a 56 y.o. female presenting on 05/24/2017 for comprehensive medical examination. Current medical complaints include:see below  Having to run to the bathroom any time she eats and has diarrhea daily x several months. Very bothersome to her as she's always having to think about where the bathrooms are and is afraid of having an accident if she can't make it quickly. No real abdominal pain or cramping associated, no N/V. No new medications, recent travel, dietary changes. Feels like her stress has increased lately but still moods are under good control.   Otherwise, states she's doing well on her regular medications. Did not take her BP medications yet today but states they're typically WNL when checked.   Compliant with inhalers and allergy regimen.   Moods stable on prozac and seroquel.   Depression Screen done today and results listed below:  Depression screen [facility] 2/9 05/24/2017  Decreased Interest 0  Down, Depressed, Hopeless 0  PHQ - 2 Score 0    The patient does not have a history of falls. I did not complete a risk assessment for falls. A plan of care for falls was not documented.   Past Medical History:  Past Medical History:  Diagnosis Date  . Acute hepatitis C   . Anxiety   . AR (allergic rhinitis)   . Arthritis   . Bulging lumbar disc   . COPD (chronic obstructive pulmonary disease) (HCC)    mild  . Depression   . Hypertension   . Plantar fasciitis   . Restless leg syndrome, controlled     Surgical History:  Past Surgical History:  Procedure Laterality Date  . CYSTOCELE REPAIR N/A 10/19/2015   Procedure: ANTERIOR REPAIR (CYSTOCELE);  Surgeon: Brayton Mars, MD;  Location: [facility] ORS;  Service: Gynecology;   Laterality: N/A;  . RECTOCELE REPAIR N/A 05/02/2016   Procedure: POSTERIOR REPAIR (RECTOCELE);  Surgeon: Brayton Mars, MD;  Location: [facility] ORS;  Service: Gynecology;  Laterality: N/A;  . TUBAL LIGATION    . VAGINAL HYSTERECTOMY Bilateral 10/19/2015   Procedure: HYSTERECTOMY VAGINAL WITH BILATERAL SALPINGO-OOPHERECTOMY;  Surgeon: Brayton Mars, MD;  Location: [facility] ORS;  Service: Gynecology;  Laterality: Bilateral;  . VAGINAL HYSTERECTOMY      Medications:  Current Outpatient Medications on File Prior to Visit  Medication Sig  . amLODipine (NORVASC) 5 MG tablet Take 1 tablet (5 mg total) by mouth daily.  . budesonide-formoterol (SYMBICORT) 160-4.5 MCG/ACT inhaler Inhale 2 puffs into the lungs 2 (two) times daily.  . cetirizine (ZYRTEC) 10 MG tablet Take 1 tablet (10 mg total) by mouth daily.  . cyclobenzaprine (FLEXERIL) 10 MG tablet Take 1 tablet (10 mg total) by mouth 3 (three) times daily as needed for muscle spasms.  Marland Kitchen EPINEPHrine 0.3 mg/0.3 mL IJ SOAJ injection Inject 0.3 mLs (0.3 mg total) into the muscle once.  Marland Kitchen estradiol (ESTRACE) 0.1 MG/GM vaginal cream Apply 1/2 gram per vagina every night for 4 weeks, then apply two times a week (Patient taking differently: Place 0.5 Applicatorfuls vaginally 2 (two) times a week. )  . estradiol (ESTRACE) 1 MG tablet TAKE 1 TABLET BY  MOUTH ONCE A DAY  . fluticasone (FLONASE) 50 MCG/ACT nasal spray Place 2 sprays into both nostrils daily.  Marland Kitchen gabapentin (NEURONTIN) 300 MG capsule TAKE 1 CAPSULE BY MOUTH THREE TIMES DAILY  . guaiFENesin (MUCINEX) 600 MG 12 hr tablet Take 1 tablet (600 mg total) by mouth 2 (two) times daily as needed.  . hydrochlorothiazide (HYDRODIURIL) 25 MG tablet TAKE 1 TABLET BY MOUTH ONCE DAILY  . magnesium gluconate (MAGONATE) 500 MG tablet Take 500 mg by mouth at bedtime.  . predniSONE (DELTASONE) 20 MG tablet Take 2 tablets (40 mg total) by mouth daily with breakfast.   Current Facility-Administered Medications on  File Prior to Visit  Medication  . betamethasone acetate-betamethasone sodium phosphate (CELESTONE) injection 3 mg  . betamethasone acetate-betamethasone sodium phosphate (CELESTONE) injection 3 mg    Allergies:  Allergies  Allergen Reactions  . Bee Venom Anaphylaxis  . Seasonal Ic [Cholestatin] Other (See Comments)    Runny nose and itching eyes SEASONAL ALLEGIES  . Lisinopril Anxiety    Social History:  Social History   Socioeconomic History  . Marital status: Single    Spouse name: Not on file  . Number of children: Not on file  . Years of education: Not on file  . Highest education level: Not on file  Social Needs  . Financial resource strain: Not on file  . Food insecurity - worry: Not on file  . Food insecurity - inability: Not on file  . Transportation needs - medical: Not on file  . Transportation needs - non-medical: Not on file  Occupational History  . Not on file  Tobacco Use  . Smoking status: Current Every Day Smoker    Packs/day: 1.00    Types: Cigarettes  . Smokeless tobacco: Never Used  . Tobacco comment: patient has been Rx nicotine patch  Substance and Sexual Activity  . Alcohol use: Yes    Comment: occassional  . Drug use: No  . Sexual activity: No  Other Topics Concern  . Not on file  Social History Narrative  . Not on file   Social History   Tobacco Use  Smoking Status Current Every Day Smoker  . Packs/day: 1.00  . Types: Cigarettes  Smokeless Tobacco Never Used  Tobacco Comment   patient has been Rx nicotine patch   Social History   Substance and Sexual Activity  Alcohol Use Yes   Comment: occassional    Family History:  Family History  Problem Relation Age of Onset  . Breast cancer Mother   . Pancreatic cancer Mother   . Cancer Mother        breast and pancreatic  . Hypertension Mother   . Migraines Mother   . Heart disease Father   . Mental illness Sister        depression  . Heart attack Sister   . Alcohol abuse  Maternal Grandfather   . Cancer Maternal Grandmother        lung and brain  . Mental illness Sister        depression    Past medical history, surgical history, medications, allergies, family history and social history reviewed with patient today and changes made to appropriate areas of the chart.   Review of Systems - General ROS: negative Psychological ROS: negative Ophthalmic ROS: negative ENT ROS: negative Allergy and Immunology ROS: negative Breast ROS: negative for breast lumps Respiratory ROS: no cough, shortness of breath, or wheezing Cardiovascular ROS: no chest pain or dyspnea on exertion  Gastrointestinal ROS: positive for - diarrhea Genito-Urinary ROS: no dysuria, trouble voiding, or hematuria Musculoskeletal ROS: negative Neurological ROS: no TIA or stroke symptoms Dermatological ROS: negative All other ROS negative except what is listed above and in the HPI.      Objective:    BP (!) 153/80   Pulse 86   Ht 5' 5.35" (1.66 m)   Wt 130 lb (59 kg)   LMP  (LMP Unknown)   SpO2 94%   BMI 21.40 kg/m   Wt Readings from Last 3 Encounters:  05/24/17 130 lb (59 kg)  02/20/17 125 lb (56.7 kg)  12/21/16 123 lb (55.8 kg)    Physical Exam  Constitutional: She is oriented to person, place, and time. She appears well-developed and well-nourished. No distress.  HENT:  Head: Atraumatic.  Right Ear: External ear normal.  Left Ear: External ear normal.  Nose: Nose normal.  Mouth/Throat: Oropharynx is clear and moist. No oropharyngeal exudate.  Eyes: Conjunctivae are normal. Pupils are equal, round, and reactive to light. No scleral icterus.  Neck: Normal range of motion. Neck supple. No thyromegaly present.  Cardiovascular: Normal rate, regular rhythm, normal heart sounds and intact distal pulses.  Pulmonary/Chest: Effort normal and breath sounds normal. No respiratory distress. Right breast exhibits no mass, no skin change and no tenderness. Left breast exhibits no mass, no  skin change and no tenderness.  Abdominal: Soft. Bowel sounds are normal. She exhibits no mass. There is no tenderness.  Musculoskeletal: Normal range of motion. She exhibits no edema or tenderness.  Lymphadenopathy:    She has no cervical adenopathy.    She has no axillary adenopathy.  Neurological: She is alert and oriented to person, place, and time. No cranial nerve deficit.  Skin: Skin is warm and dry. No rash noted.  Psychiatric: She has a normal mood and affect. Her behavior is normal.  Nursing note and vitals reviewed.  Results for orders placed or performed in visit on 05/24/17  CBC with Differential/Platelet  Result Value Ref Range   WBC 7.6 3.4 - 10.8 x10E3/uL   RBC 4.56 3.77 - 5.28 x10E6/uL   Hemoglobin 16.0 (H) 11.1 - 15.9 g/dL   Hematocrit 45.2 34.0 - 46.6 %   MCV 99 (H) 79 - 97 fL   MCH 35.1 (H) 26.6 - 33.0 pg   MCHC 35.4 31.5 - 35.7 g/dL   RDW 13.6 12.3 - 15.4 %   Platelets 248 150 - 379 x10E3/uL   Neutrophils 52 Not Estab. %   Lymphs 28 Not Estab. %   Monocytes 11 Not Estab. %   Eos 8 Not Estab. %   Basos 1 Not Estab. %   Neutrophils Absolute 4.0 1.4 - 7.0 x10E3/uL   Lymphocytes Absolute 2.1 0.7 - 3.1 x10E3/uL   Monocytes Absolute 0.8 0.1 - 0.9 x10E3/uL   EOS (ABSOLUTE) 0.6 (H) 0.0 - 0.4 x10E3/uL   Basophils Absolute 0.0 0.0 - 0.2 x10E3/uL   Immature Granulocytes 0 Not Estab. %   Immature Grans (Abs) 0.0 0.0 - 0.1 x10E3/uL  Comprehensive metabolic panel  Result Value Ref Range   Glucose 123 (H) 65 - 99 mg/dL   BUN 11 6 - 24 mg/dL   Creatinine, Ser 0.75 0.57 - 1.00 mg/dL   GFR calc non Af Amer 89 >59 mL/min/1.73   GFR calc Af Amer 102 >59 mL/min/1.73   BUN/Creatinine Ratio 15 9 - 23   Sodium 136 134 - 144 mmol/L   Potassium 4.4 3.5 - 5.2 mmol/L  Chloride 96 96 - 106 mmol/L   CO2 24 20 - 29 mmol/L   Calcium 9.6 8.7 - 10.2 mg/dL   Total Protein 7.3 6.0 - 8.5 g/dL   Albumin 4.5 3.5 - 5.5 g/dL   Globulin, Total 2.8 1.5 - 4.5 g/dL   Albumin/Globulin  Ratio 1.6 1.2 - 2.2   Bilirubin Total 0.6 0.0 - 1.2 mg/dL   Alkaline Phosphatase 98 39 - 117 IU/L   AST 117 (H) 0 - 40 IU/L   ALT 96 (H) 0 - 32 IU/L  Lipid Panel w/o Chol/HDL Ratio  Result Value Ref Range   Cholesterol, Total 160 100 - 199 mg/dL   Triglycerides 101 0 - 149 mg/dL   HDL 49 >39 mg/dL   VLDL Cholesterol Cal 20 5 - 40 mg/dL   LDL Calculated 91 0 - 99 mg/dL  TSH  Result Value Ref Range   TSH 1.780 0.450 - 4.500 uIU/mL  UA/M w/rflx Culture, Routine  Result Value Ref Range   Specific Gravity, UA <1.005 (L) 1.005 - 1.030   pH, UA 5.0 5.0 - 7.5   Color, UA Yellow Yellow   Appearance Ur Clear Clear   Leukocytes, UA Negative Negative   Protein, UA Negative Negative/Trace   Glucose, UA Negative Negative   Ketones, UA Negative Negative   RBC, UA Negative Negative   Bilirubin, UA Negative Negative   Urobilinogen, Ur 0.2 0.2 - 1.0 mg/dL   Nitrite, UA Negative Negative      Assessment & Plan:   Problem List Items Addressed This Visit      Cardiovascular and Mediastinum   Hypertension - Primary    Slightly above goal today but states she hasn't yet taken her medications today. Will check at her next visit and adjust if still elevated. Continue current regimen in meantime.       Relevant Medications   metoprolol succinate (TOPROL-XL) 50 MG 24 hr tablet   hydrochlorothiazide (HYDRODIURIL) 25 MG tablet   Other Relevant Orders   CBC with Differential/Platelet (Completed)   Comprehensive metabolic panel (Completed)   UA/M w/rflx Culture, Routine (Completed)     Respiratory   Moderate COPD (chronic obstructive pulmonary disease) (HCC)    Stable without recent flare. Continue current regimen. Pt not wanting to quit smoking at this time.       Relevant Medications   montelukast (SINGULAIR) 10 MG tablet   Tiotropium Bromide Monohydrate (SPIRIVA RESPIMAT) 2.5 MCG/ACT AERS   AR (allergic rhinitis)    Stable, continue current regimen        Genitourinary   Vaginal  atrophy    Stable on estrace tabs, continue current regimen        Other   Bipolar 1 disorder ([place])    Under good control with prozac and seroquel, continue current regimen       Other Visit Diagnoses    Annual physical exam       Await fasting labs. Mammogram ordered, flu shot given. UTD otherwise.    Relevant Orders   Lipid Panel w/o Chol/HDL Ratio (Completed)   TSH (Completed)   Needs flu shot       Relevant Orders   Flu Vaccine QUAD 36+ mos IM   Breast cancer screening       Relevant Orders   MM DIGITAL SCREENING BILATERAL   Diarrhea, unspecified type       Will try bentyl and dietary changes. Pt has failed probiotics in the past and does not want to try  again. Monitor closely for benefit.        Follow up plan: Return in about 4 months (around 09/21/2017) for Diarrhea, med management.   LABORATORY TESTING:  - Pap smear: not applicable  IMMUNIZATIONS:   - Tdap: Tetanus vaccination status reviewed: last tetanus booster within 10 years. - Influenza: Administered today  SCREENING: -Mammogram: Ordered today  - Colonoscopy: Up to date   PATIENT COUNSELING:   Advised to take 1 mg of folate supplement per day if capable of pregnancy.   Sexuality: Discussed sexually transmitted diseases, partner selection, use of condoms, avoidance of unintended pregnancy  and contraceptive alternatives.   Advised to avoid cigarette smoking.  I discussed with the patient that most people either abstain from alcohol or drink within safe limits (<=14/week and <=4 drinks/occasion for males, <=7/weeks and <= 3 drinks/occasion for females) and that the risk for alcohol disorders and other health effects rises proportionally with the number of drinks per week and how often a drinker exceeds daily limits.  Discussed cessation/primary prevention of drug use and availability of treatment for abuse.   Diet: Encouraged to adjust caloric intake to maintain  or achieve ideal body weight, to reduce  intake of dietary saturated fat and total fat, to limit sodium intake by avoiding high sodium foods and not adding table salt, and to maintain adequate dietary potassium and calcium preferably from fresh fruits, vegetables, and low-fat dairy products.    stressed the importance of regular exercise  Injury prevention: Discussed safety belts, safety helmets, smoke detector, smoking near bedding or upholstery.   Dental health: Discussed importance of regular tooth brushing, flossing, and dental visits.    NEXT PREVENTATIVE PHYSICAL DUE IN 1 YEAR. Return in about 4 months (around 09/21/2017) for Diarrhea, med management.

## 2017-05-25 LAB — COMPREHENSIVE METABOLIC PANEL
A/G RATIO: 1.6 (ref 1.2–2.2)
ALBUMIN: 4.5 g/dL (ref 3.5–5.5)
ALT: 96 IU/L — ABNORMAL HIGH (ref 0–32)
AST: 117 IU/L — AB (ref 0–40)
Alkaline Phosphatase: 98 IU/L (ref 39–117)
BUN / CREAT RATIO: 15 (ref 9–23)
BUN: 11 mg/dL (ref 6–24)
Bilirubin Total: 0.6 mg/dL (ref 0.0–1.2)
CALCIUM: 9.6 mg/dL (ref 8.7–10.2)
CO2: 24 mmol/L (ref 20–29)
CREATININE: 0.75 mg/dL (ref 0.57–1.00)
Chloride: 96 mmol/L (ref 96–106)
GFR, EST AFRICAN AMERICAN: 102 mL/min/{1.73_m2} (ref >59)
GFR, EST NON AFRICAN AMERICAN: 89 mL/min/{1.73_m2} (ref >59)
GLOBULIN, TOTAL: 2.8 g/dL (ref 1.5–4.5)
Glucose: 123 mg/dL — ABNORMAL HIGH (ref 65–99)
POTASSIUM: 4.4 mmol/L (ref 3.5–5.2)
SODIUM: 136 mmol/L (ref 134–144)
Total Protein: 7.3 g/dL (ref 6.0–8.5)

## 2017-05-25 LAB — LIPID PANEL W/O CHOL/HDL RATIO
Cholesterol, Total: 160 mg/dL (ref 100–199)
HDL: 49 mg/dL (ref >39)
LDL CALC: 91 mg/dL (ref 0–99)
Triglycerides: 101 mg/dL (ref 0–149)
VLDL Cholesterol Cal: 20 mg/dL (ref 5–40)

## 2017-05-25 LAB — CBC WITH DIFFERENTIAL/PLATELET
BASOS: 1 %
Basophils Absolute: 0 10*3/uL (ref 0.0–0.2)
EOS (ABSOLUTE): 0.6 10*3/uL — ABNORMAL HIGH (ref 0.0–0.4)
EOS: 8 %
HEMATOCRIT: 45.2 % (ref 34.0–46.6)
HEMOGLOBIN: 16 g/dL — AB (ref 11.1–15.9)
IMMATURE GRANULOCYTES: 0 %
Immature Grans (Abs): 0 10*3/uL (ref 0.0–0.1)
LYMPHS ABS: 2.1 10*3/uL (ref 0.7–3.1)
Lymphs: 28 %
MCH: 35.1 pg — ABNORMAL HIGH (ref 26.6–33.0)
MCHC: 35.4 g/dL (ref 31.5–35.7)
MCV: 99 fL — AB (ref 79–97)
MONOCYTES: 11 %
MONOS ABS: 0.8 10*3/uL (ref 0.1–0.9)
Neutrophils Absolute: 4 10*3/uL (ref 1.4–7.0)
Neutrophils: 52 %
Platelets: 248 10*3/uL (ref 150–379)
RBC: 4.56 x10E6/uL (ref 3.77–5.28)
RDW: 13.6 % (ref 12.3–15.4)
WBC: 7.6 10*3/uL (ref 3.4–10.8)

## 2017-05-25 LAB — TSH: TSH: 1.78 u[IU]/mL (ref 0.450–4.500)

## 2017-05-26 ENCOUNTER — Other Ambulatory Visit: Payer: Self-pay | Admitting: Family Medicine

## 2017-05-26 ENCOUNTER — Encounter: Payer: Self-pay | Admitting: Family Medicine

## 2017-05-26 MED ORDER — CYCLOBENZAPRINE HCL 10 MG PO TABS: 10.0000 mg | ORAL_TABLET | Freq: Three times a day (TID) | ORAL | 0 refills | Status: DC | PRN

## 2017-05-26 MED ORDER — ESTRADIOL 0.1 MG/GM VA CREA: 0.5000 | TOPICAL_CREAM | VAGINAL | 11 refills | Status: DC

## 2017-05-26 NOTE — Assessment & Plan Note (Signed)
Under good control with prozac and seroquel, continue current regimen

## 2017-05-26 NOTE — Assessment & Plan Note (Signed)
Stable on estrace tabs, continue current regimen

## 2017-05-26 NOTE — Assessment & Plan Note (Signed)
Stable without recent flare. Continue current regimen. Pt not wanting to quit smoking at this time.

## 2017-05-26 NOTE — Assessment & Plan Note (Signed)
Slightly above goal today but states she hasn't yet taken her medications today. Will check at her next visit and adjust if still elevated. Continue current regimen in meantime.

## 2017-05-26 NOTE — Assessment & Plan Note (Signed)
Stable, continue current regimen 

## 2017-06-02 ENCOUNTER — Other Ambulatory Visit: Payer: Self-pay | Admitting: Obstetrics and Gynecology

## 2017-09-18 ENCOUNTER — Other Ambulatory Visit: Payer: Self-pay | Admitting: Family Medicine

## 2017-09-18 DIAGNOSIS — J449 Chronic obstructive pulmonary disease, unspecified: Secondary | ICD-10-CM

## 2017-09-21 ENCOUNTER — Ambulatory Visit: Payer: PRIVATE HEALTH INSURANCE | Admitting: Family Medicine

## 2017-09-28 ENCOUNTER — Encounter: Payer: Self-pay | Admitting: Family Medicine

## 2017-09-28 ENCOUNTER — Ambulatory Visit: Payer: PRIVATE HEALTH INSURANCE | Admitting: Family Medicine

## 2017-09-28 VITALS — BP 126/72 | HR 64 | Temp 98.9°F | Ht 64.0 in | Wt 137.7 lb

## 2017-09-28 DIAGNOSIS — I10 Essential (primary) hypertension: Secondary | ICD-10-CM | POA: Diagnosis not present

## 2017-09-28 DIAGNOSIS — M545 Low back pain: Secondary | ICD-10-CM

## 2017-09-28 DIAGNOSIS — G8929 Other chronic pain: Secondary | ICD-10-CM

## 2017-09-28 DIAGNOSIS — L989 Disorder of the skin and subcutaneous tissue, unspecified: Secondary | ICD-10-CM | POA: Diagnosis not present

## 2017-09-28 MED ORDER — HYDROCHLOROTHIAZIDE 25 MG PO TABS: 25.0000 mg | ORAL_TABLET | Freq: Every day | ORAL | 6 refills | Status: DC

## 2017-09-28 MED ORDER — GABAPENTIN 300 MG PO CAPS: 300.0000 mg | ORAL_CAPSULE | Freq: Three times a day (TID) | ORAL | 1 refills | Status: DC

## 2017-09-28 MED ORDER — CYCLOBENZAPRINE HCL 10 MG PO TABS
10.0000 mg | ORAL_TABLET | Freq: Three times a day (TID) | ORAL | 1 refills | Status: DC | PRN
Start: 2017-09-28 — End: 2017-10-30

## 2017-09-28 NOTE — Progress Notes (Signed)
BP 126/72 (BP Location: Left Arm, Patient Position: Sitting, Cuff Size: Normal)   Pulse 64   Temp 98.9 F (37.2 C) (Tympanic)   Ht 5\' 4"  (1.626 m)   Wt 137 lb 11.2 oz (62.5 kg)   LMP  (LMP Unknown)   SpO2 96%   BMI 23.64 kg/m    Subjective:    Patient ID: Monique Tucker, female    DOB: 1960/07/06, 57 y.o.   MRN: [id number]  HPI: KESHAWN Tucker is a 57 y.o. female  Chief Complaint  Patient presents with  . Follow-up    Patient states no other complaints.  . Medication Refill  . Hypertension    Blood pressure is good when patient is home. But states her blood pressure is high when she comes here.    Skin lesion on left ear, painful this past week.   BPs excellent when checked at home.   Relevant past medical, surgical, family and social history reviewed and updated as indicated. Interim medical history since our last visit reviewed. Allergies and medications reviewed and updated.  Review of Systems  Per HPI unless specifically indicated above     Objective:    BP 126/72 (BP Location: Left Arm, Patient Position: Sitting, Cuff Size: Normal)   Pulse 64   Temp 98.9 F (37.2 C) (Tympanic)   Ht 5\' 4"  (1.626 m)   Wt 137 lb 11.2 oz (62.5 kg)   LMP  (LMP Unknown)   SpO2 96%   BMI 23.64 kg/m   Wt Readings from Last 3 Encounters:  09/28/17 137 lb 11.2 oz (62.5 kg)  05/24/17 130 lb (59 kg)  02/20/17 125 lb (56.7 kg)    Physical Exam  Results for orders placed or performed in visit on 05/24/17  CBC with Differential/Platelet  Result Value Ref Range   WBC 7.6 3.4 - 10.8 x10E3/uL   RBC 4.56 3.77 - 5.28 x10E6/uL   Hemoglobin 16.0 (H) 11.1 - 15.9 g/dL   Hematocrit 45.2 34.0 - 46.6 %   MCV 99 (H) 79 - 97 fL   MCH 35.1 (H) 26.6 - 33.0 pg   MCHC 35.4 31.5 - 35.7 g/dL   RDW 13.6 12.3 - 15.4 %   Platelets 248 150 - 379 x10E3/uL   Neutrophils 52 Not Estab. %   Lymphs 28 Not Estab. %   Monocytes 11 Not Estab. %   Eos 8 Not Estab. %   Basos 1 Not Estab. %   Neutrophils Absolute 4.0 1.4 - 7.0 x10E3/uL   Lymphocytes Absolute 2.1 0.7 - 3.1 x10E3/uL   Monocytes Absolute 0.8 0.1 - 0.9 x10E3/uL   EOS (ABSOLUTE) 0.6 (H) 0.0 - 0.4 x10E3/uL   Basophils Absolute 0.0 0.0 - 0.2 x10E3/uL   Immature Granulocytes 0 Not Estab. %   Immature Grans (Abs) 0.0 0.0 - 0.1 x10E3/uL  Comprehensive metabolic panel  Result Value Ref Range   Glucose 123 (H) 65 - 99 mg/dL   BUN 11 6 - 24 mg/dL   Creatinine, Ser 0.75 0.57 - 1.00 mg/dL   GFR calc non Af Amer 89 >59 mL/min/1.73   GFR calc Af Amer 102 >59 mL/min/1.73   BUN/Creatinine Ratio 15 9 - 23   Sodium 136 134 - 144 mmol/L   Potassium 4.4 3.5 - 5.2 mmol/L   Chloride 96 96 - 106 mmol/L   CO2 24 20 - 29 mmol/L   Calcium 9.6 8.7 - 10.2 mg/dL   Total Protein 7.3 6.0 - 8.5 g/dL  Albumin 4.5 3.5 - 5.5 g/dL   Globulin, Total 2.8 1.5 - 4.5 g/dL   Albumin/Globulin Ratio 1.6 1.2 - 2.2   Bilirubin Total 0.6 0.0 - 1.2 mg/dL   Alkaline Phosphatase 98 39 - 117 IU/L   AST 117 (H) 0 - 40 IU/L   ALT 96 (H) 0 - 32 IU/L  Lipid Panel w/o Chol/HDL Ratio  Result Value Ref Range   Cholesterol, Total 160 100 - 199 mg/dL   Triglycerides 101 0 - 149 mg/dL   HDL 49 >39 mg/dL   VLDL Cholesterol Cal 20 5 - 40 mg/dL   LDL Calculated 91 0 - 99 mg/dL  TSH  Result Value Ref Range   TSH 1.780 0.450 - 4.500 uIU/mL  UA/M w/rflx Culture, Routine  Result Value Ref Range   Specific Gravity, UA <1.005 (L) 1.005 - 1.030   pH, UA 5.0 5.0 - 7.5   Color, UA Yellow Yellow   Appearance Ur Clear Clear   Leukocytes, UA Negative Negative   Protein, UA Negative Negative/Trace   Glucose, UA Negative Negative   Ketones, UA Negative Negative   RBC, UA Negative Negative   Bilirubin, UA Negative Negative   Urobilinogen, Ur 0.2 0.2 - 1.0 mg/dL   Nitrite, UA Negative Negative      Assessment & Plan:   Problem List Items Addressed This Visit      Cardiovascular and Mediastinum   Hypertension - Primary   Relevant Medications    hydrochlorothiazide (HYDRODIURIL) 25 MG tablet    Other Visit Diagnoses    Skin lesion       Chronic bilateral low back pain without sciatica       Relevant Medications   cyclobenzaprine (FLEXERIL) 10 MG tablet       Follow up plan: Return in about 6 months (around 03/30/2018) for 6 month f/u.

## 2017-10-01 NOTE — Patient Instructions (Signed)
Follow up in 6 months 

## 2017-10-30 ENCOUNTER — Other Ambulatory Visit: Payer: Self-pay | Admitting: Family Medicine

## 2017-10-30 NOTE — Telephone Encounter (Signed)
Rx refill request: Flexeril 10 mg       Last filled: 09/28/17  # 90  LOV: 09/28/17  PCP: [facility]  Pharmacy: verified

## 2017-10-31 NOTE — Telephone Encounter (Signed)
Rx refill request: Flexeril 10 mg       Last filled: 09/28/17  # 90  LOV: 09/28/17  PCP: [facility]  Pharmacy: verified

## 2017-12-11 ENCOUNTER — Other Ambulatory Visit: Payer: Self-pay | Admitting: Family Medicine

## 2017-12-12 ENCOUNTER — Other Ambulatory Visit: Payer: Self-pay | Admitting: Family Medicine

## 2018-01-12 ENCOUNTER — Other Ambulatory Visit: Payer: Self-pay | Admitting: Family Medicine

## 2018-01-26 ENCOUNTER — Other Ambulatory Visit: Payer: Self-pay | Admitting: Family Medicine

## 2018-02-10 ENCOUNTER — Other Ambulatory Visit: Payer: Self-pay | Admitting: Family Medicine

## 2018-02-15 ENCOUNTER — Ambulatory Visit: Payer: PRIVATE HEALTH INSURANCE | Admitting: Obstetrics and Gynecology

## 2018-02-15 ENCOUNTER — Encounter: Payer: Self-pay | Admitting: Obstetrics and Gynecology

## 2018-02-15 VITALS — BP 151/77 | HR 69 | Ht 64.0 in | Wt 139.0 lb

## 2018-02-15 DIAGNOSIS — Z780 Asymptomatic menopausal state: Secondary | ICD-10-CM

## 2018-02-15 DIAGNOSIS — Z90710 Acquired absence of both cervix and uterus: Secondary | ICD-10-CM

## 2018-02-15 DIAGNOSIS — N393 Stress incontinence (female) (male): Secondary | ICD-10-CM

## 2018-02-15 DIAGNOSIS — Z1211 Encounter for screening for malignant neoplasm of colon: Secondary | ICD-10-CM | POA: Diagnosis not present

## 2018-02-15 DIAGNOSIS — N952 Postmenopausal atrophic vaginitis: Secondary | ICD-10-CM

## 2018-02-15 MED ORDER — ESTRADIOL 1 MG PO TABS: 1.0000 mg | ORAL_TABLET | Freq: Every day | ORAL | 3 refills | Status: DC

## 2018-02-15 MED ORDER — ESTRADIOL 0.1 MG/GM VA CREA: 0.5000 | TOPICAL_CREAM | VAGINAL | 11 refills | Status: DC

## 2018-02-15 NOTE — Patient Instructions (Addendum)
1.  Begin performing Kegel exercises.  Information given in handout. 2.  Referral to physical therapy for stress urinary incontinence 3.  Consider using the [place] Impressa vaginal inserts for urinary incontinence.  These can be obtained at any pharmacy or [place]. 4.  Return in 3 months for follow-up. 5.  Restart Premarin cream intravaginal 1/2 to 1 g twice weekly

## 2018-02-15 NOTE — Progress Notes (Signed)
ANNUAL GYN  ENCOUNTER NOTE  Subjective:       Monique Tucker is a 57 y.o. [id number] female here for a routine annual gynecologic exam.  Current complaints: 1. Bladder leaking  2. Loose stool  57 year old white female para 2-0-0-2, status post TVH anterior colporrhaphy in April 2017, status post posterior colporrhaphy with enterocele ligation in November 2017, presents for yearly follow-up.  She had her yearly checkup with her primary care provider several weeks ago.  Mammogram has been ordered. Main complaints noted today include stress incontinence symptoms and loose stools with occasional fecal incontinence.  GU history: Urinary frequency-4-5 times daily Nocturia-1 time per night Patient reports incomplete bladder emptying. She reports stress urinary incontinence symptoms with leaking of urine with coughing sneezing lifting and laughing. She has occasional urge symptoms. She does wear pads She has not performed Kegel exercises or done any activities to help with urinary incontinence.  Urinary incontinence has been ongoing intermittently following her vaginal hysterectomy.  The first 1 to 2 months post surgery she had minimal leaking; since that time she has had a progressively worsening situation.  She now has to wear pads.  She also notes that she has incomplete bladder emptying as she can go to the bathroom in a limited time after having evacuated her bladder.  She has no history of chronic UTIs or pyelonephritis.  GI history: Patient reports going to the bathroom for bowel movements daily.  She states that she occasionally soils with fecal material when she goes to urinate.  She does not have any significant sensation when this occurs. She does not splint for bowel movements. She has a history of SVD.  She has a history of episiotomy requiring many stitches.  She does not recall any history of possible sphincter tear or fourth degree laceration.   Gynecologic History No LMP recorded  (lmp unknown). Patient has had a hysterectomy. Contraception: Status post TVH BSO with anterior colporrhaphy; status post posterior colporrhaphy with enterocele ligation Last Pap: 2016  Results were: normal Last mammogram: 2016  Results were: normal  Obstetric History OB History  Gravida Para Term Preterm AB Living  2 2 2     2   SAB TAB Ectopic Multiple Live Births          2    # Outcome Date GA Lbr Len/2nd Weight Sex Delivery Anes PTL Lv  2 Term 1981   5 lb 12.8 oz (2.631 kg) F Vag-Spont   LIV  1 Term 1979   4 lb 2.4 oz (1.882 kg) F Vag-Spont   LIV    Past Medical History:  Diagnosis Date  . Acute hepatitis C   . Anxiety   . AR (allergic rhinitis)   . Arthritis   . Bulging lumbar disc   . COPD (chronic obstructive pulmonary disease) (HCC)    mild  . Depression   . Hypertension   . Plantar fasciitis   . Restless leg syndrome, controlled     Past Surgical History:  Procedure Laterality Date  . CYSTOCELE REPAIR N/A 10/19/2015   Procedure: ANTERIOR REPAIR (CYSTOCELE);  Surgeon: Brayton Mars, MD;  Location: [facility];  Service: Gynecology;  Laterality: N/A;  . RECTOCELE REPAIR N/A 05/02/2016   Procedure: POSTERIOR REPAIR (RECTOCELE);  Surgeon: Brayton Mars, MD;  Location: [facility];  Service: Gynecology;  Laterality: N/A;  . TUBAL LIGATION    . VAGINAL HYSTERECTOMY Bilateral 10/19/2015   Procedure: HYSTERECTOMY VAGINAL WITH BILATERAL SALPINGO-OOPHERECTOMY;  Surgeon: Alanda Slim Tekisha Darcey,  MD;  Location: [facility];  Service: Gynecology;  Laterality: Bilateral;  . VAGINAL HYSTERECTOMY      Current Outpatient Medications on File Prior to Visit  Medication Sig Dispense Refill  . amLODipine (NORVASC) 5 MG tablet TAKE 1 TABLET BY MOUTH ONCE DAILY 30 tablet 6  . cetirizine (ZYRTEC) 10 MG tablet TAKE 1 TABLET BY MOUTH ONCE DAILY 30 tablet 11  . cyclobenzaprine (FLEXERIL) 10 MG tablet TAKE 1 TABLET BY MOUTH 3 TIMES DAILY AS NEEDED FOR MUSCLE SPASMS 90 tablet 1  .  dicyclomine (BENTYL) 10 MG capsule Take 1 capsule (10 mg total) by mouth 3 (three) times daily before meals. 90 capsule 3  . EPINEPHRINE 0.3 mg/0.3 mL IJ SOAJ injection INJECT 0.3 MLS INTO THE MUSCLE ONCE 2 Device prn  . estradiol (ESTRACE) 0.1 MG/GM vaginal cream Place 0.5 Applicatorfuls vaginally 2 (two) times a week. 42.5 g 11  . estradiol (ESTRACE) 1 MG tablet Take 1 tablet (1 mg total) by mouth daily. 90 tablet 3  . FLUoxetine (PROZAC) 10 MG capsule TAKE 1 CAPSULE BY MOUTH ONCE DAILY 30 capsule 6  . fluticasone (FLONASE) 50 MCG/ACT nasal spray Place 2 sprays into both nostrils daily. 16 g 6  . gabapentin (NEURONTIN) 300 MG capsule Take 1 capsule (300 mg total) by mouth 3 (three) times daily. 90 capsule 1  . hydrochlorothiazide (HYDRODIURIL) 25 MG tablet Take 1 tablet (25 mg total) by mouth daily. 30 tablet 6  . magnesium gluconate (MAGONATE) 500 MG tablet Take 500 mg by mouth at bedtime.    . metoprolol succinate (TOPROL-XL) 50 MG 24 hr tablet Take 1 tablet (50 mg total) by mouth daily. Take with or immediately following a meal. 30 tablet 6  . montelukast (SINGULAIR) 10 MG tablet Take 1 tablet (10 mg total) by mouth at bedtime. 30 tablet 11  . QUEtiapine Fumarate (SEROQUEL XR) 150 MG 24 hr tablet TAKE 1 TABLET BY MOUTH DAILY AT BEDTIME 30 tablet 6  . SYMBICORT 160-4.5 MCG/ACT inhaler INHALE 2 PUFFS INTO THE LUNGS TWICE DAILY 30.6 g 0  . Tiotropium Bromide Monohydrate (SPIRIVA RESPIMAT) 2.5 MCG/ACT AERS Inhale 1 puff into the lungs daily. 1 Inhaler 6   Current Facility-Administered Medications on File Prior to Visit  Medication Dose Route Frequency Provider Last Rate Last Dose  . betamethasone acetate-betamethasone sodium phosphate (CELESTONE) injection 3 mg  3 mg Intramuscular Once Daylene Katayama M, DPM      . betamethasone acetate-betamethasone sodium phosphate (CELESTONE) injection 3 mg  3 mg Intramuscular Once Edrick Kins, DPM        Allergies  Allergen Reactions  . Bee Venom  Anaphylaxis  . Seasonal Ic [Cholestatin] Other (See Comments)    Runny nose and itching eyes SEASONAL ALLEGIES  . Lisinopril Anxiety    Social History   Socioeconomic History  . Marital status: Single    Spouse name: Not on file  . Number of children: Not on file  . Years of education: Not on file  . Highest education level: Not on file  Occupational History  . Not on file  Social Needs  . Financial resource strain: Not on file  . Food insecurity:    Worry: Not on file    Inability: Not on file  . Transportation needs:    Medical: Not on file    Non-medical: Not on file  Tobacco Use  . Smoking status: Current Every Day Smoker    Packs/day: 1.00    Types: Cigarettes  . Smokeless  tobacco: Never Used  . Tobacco comment: patient has been Rx nicotine patch  Substance and Sexual Activity  . Alcohol use: Yes    Comment: occassional  . Drug use: No  . Sexual activity: Never  Lifestyle  . Physical activity:    Days per week: Not on file    Minutes per session: Not on file  . Stress: Not on file  Relationships  . Social connections:    Talks on phone: Not on file    Gets together: Not on file    Attends religious service: Not on file    Active member of club or organization: Not on file    Attends meetings of clubs or organizations: Not on file    Relationship status: Not on file  . Intimate partner violence:    Fear of current or ex partner: Not on file    Emotionally abused: Not on file    Physically abused: Not on file    Forced sexual activity: Not on file  Other Topics Concern  . Not on file  Social History Narrative  . Not on file    Family History  Problem Relation Age of Onset  . Breast cancer Mother   . Pancreatic cancer Mother   . Cancer Mother        breast and pancreatic  . Hypertension Mother   . Migraines Mother   . Heart disease Father   . Mental illness Sister        depression  . Heart attack Sister   . Alcohol abuse Maternal Grandfather    . Cancer Maternal Grandmother        lung and brain  . Mental illness Sister        depression    The following portions of the patient's history were reviewed and updated as appropriate: allergies, current medications, past family history, past medical history, past social history, past surgical history and problem list.  Review of Systems Complete review of systems is notable for that found in the HPI only.   Objective:   BP (!) 151/77   Pulse 69   Ht 5\' 4"  (1.626 m)   Wt 139 lb (63 kg)   LMP  (LMP Unknown)   BMI 23.86 kg/m  CONSTITUTIONAL: Well-developed, well-nourished female in no acute distress.  PSYCHIATRIC: Normal mood and affect. Normal behavior. Normal judgment and thought content. [place]: Alert and oriented to person, place, and time. Normal muscle tone coordination. No cranial nerve deficit noted. HENT:  Normocephalic, atraumatic EYES: Conjunctivae and EOM are normal. No scleral icterus.  NECK: Normal range of motion, supple, no masses.  Normal thyroid.  SKIN: Skin is warm and dry. No rash noted. Not diaphoretic. No erythema. No pallor. CARDIOVASCULAR: Not examined RESPIRATORY: Not examined. BREASTS: Not examined BACK: No CVA tenderness or spinal tenderness ABDOMEN: Soft, normal bowel sounds, no distention noted.  No tenderness, rebound or guarding.  BLADDER: Normal without tenderness PELVIC:  External Genitalia: Normal  BUS: Normal  Vagina: Fair estrogen effect; first-degree cystocele; rotational descent of the bladder neck is noted with Valsalva based on Q-tip test; no significant rectocele; good apex vaginal vault support  Cervix: Surgically absent  Uterus: Surgically absent  Adnexa: Normal; nonpalpable nontender  RV: External Exam NormaI, No Rectal Masses and Normal Sphincter tone  MUSCULOSKELETAL: Normal range of motion. No tenderness.  No cyanosis, clubbing, or edema.  2+ distal pulses. LYMPHATIC: No Axillary, Supraclavicular, or Inguinal  Adenopathy.    Assessment:  Menopause Vaginal atrophy,  minimally symptomatic, inconsistently using estrogen cream Status post TVH BSO with anterior colporrhaphy April 2017 Status post posterior colporrhaphy with enterocele ligation November 2017 Stress urinary incontinence Occasional fecal incontinence; normal exam today History of pessary trial, unsuccessful; patient does not desire repeat trial  Plan:  1.  Resume Premarin cream intravaginal 1/2 to 1 g twice a week 2.  Kegel exercises 3.  PT referral for stress urinary incontinence 4.  Try poise impressa vaginal inserts for urinary incontinence 5.  Return in 3 months for follow-up and further management planning 6.  Patient understands that if conservative measures are not effective in helping stress urinary incontinence, the patient will be a candidate for pubovaginal sling surgery. 7.  Stool guaiac cards are given for colon cancer screening  A total of 15 minutes were spent face-to-face with the patient during this encounter and over half of that time dealt with counseling and coordination of care.  Brayton Mars, MD  Note: This dictation was prepared with Dragon dictation along with smaller phrase technology. Any transcriptional errors that result from this process are unintentional.   Joyice Faster, CMA

## 2018-03-09 ENCOUNTER — Other Ambulatory Visit: Payer: Self-pay | Admitting: Family Medicine

## 2018-03-09 DIAGNOSIS — J449 Chronic obstructive pulmonary disease, unspecified: Secondary | ICD-10-CM

## 2018-03-12 ENCOUNTER — Ambulatory Visit: Payer: PRIVATE HEALTH INSURANCE | Admitting: Family Medicine

## 2018-03-12 ENCOUNTER — Encounter: Payer: Self-pay | Admitting: Family Medicine

## 2018-03-12 ENCOUNTER — Other Ambulatory Visit: Payer: Self-pay

## 2018-03-12 VITALS — BP 128/82 | HR 78 | Temp 98.2°F | Ht 64.0 in | Wt 133.0 lb

## 2018-03-12 DIAGNOSIS — M5432 Sciatica, left side: Secondary | ICD-10-CM

## 2018-03-12 DIAGNOSIS — I10 Essential (primary) hypertension: Secondary | ICD-10-CM

## 2018-03-12 DIAGNOSIS — J449 Chronic obstructive pulmonary disease, unspecified: Secondary | ICD-10-CM | POA: Diagnosis not present

## 2018-03-12 DIAGNOSIS — R413 Other amnesia: Secondary | ICD-10-CM

## 2018-03-12 DIAGNOSIS — Z23 Encounter for immunization: Secondary | ICD-10-CM

## 2018-03-12 MED ORDER — BUDESONIDE-FORMOTEROL FUMARATE 160-4.5 MCG/ACT IN AERO: 2.0000 | INHALATION_SPRAY | Freq: Two times a day (BID) | RESPIRATORY_TRACT | 11 refills | Status: DC

## 2018-03-12 MED ORDER — FLUTICASONE PROPIONATE 50 MCG/ACT NA SUSP: 2.0000 | Freq: Every day | NASAL | 11 refills | Status: DC

## 2018-03-12 MED ORDER — PREDNISONE 10 MG PO TABS: ORAL_TABLET | ORAL | 0 refills | Status: DC

## 2018-03-12 NOTE — Patient Instructions (Addendum)
Try the prednisone, if not getting better or staying better call  Influenza (Flu) Vaccine (Inactivated or Recombinant): What You Need to Know 1. Why get vaccinated? Influenza ("flu") is a contagious disease that spreads around the [place] every year, usually between October and May. Flu is caused by influenza viruses, and is spread mainly by coughing, sneezing, and close contact. Anyone can get flu. Flu strikes suddenly and can last several days. Symptoms vary by age, but can include:  fever/chills  sore throat  muscle aches  fatigue  cough  headache  runny or stuffy nose  Flu can also lead to pneumonia and blood infections, and cause diarrhea and seizures in children. If you have a medical condition, such as heart or lung disease, flu can make it worse. Flu is more dangerous for some people. Infants and young children, people 37 years of age and older, pregnant women, and people with certain health conditions or a weakened immune system are at greatest risk. Each year thousands of people in the [place] die from flu, and many more are hospitalized. Flu vaccine can:  keep you from getting flu,  make flu less severe if you do get it, and  keep you from spreading flu to your family and other people. 2. Inactivated and recombinant flu vaccines A dose of flu vaccine is recommended every flu season. Children 6 months through 19 years of age may need two doses during the same flu season. Everyone else needs only one dose each flu season. Some inactivated flu vaccines contain a very small amount of a mercury-based preservative called thimerosal. Studies have not shown thimerosal in vaccines to be harmful, but flu vaccines that do not contain thimerosal are available. There is no live flu virus in flu shots. They cannot cause the flu. There are many flu viruses, and they are always changing. Each year a new flu vaccine is made to protect against three or four viruses that are  likely to cause disease in the upcoming flu season. But even when the vaccine doesn't exactly match these viruses, it may still provide some protection. Flu vaccine cannot prevent:  flu that is caused by a virus not covered by the vaccine, or  illnesses that look like flu but are not.  It takes about 2 weeks for protection to develop after vaccination, and protection lasts through the flu season. 3. Some people should not get this vaccine Tell the person who is giving you the vaccine:  If you have any severe, life-threatening allergies. If you ever had a life-threatening allergic reaction after a dose of flu vaccine, or have a severe allergy to any part of this vaccine, you may be advised not to get vaccinated. Most, but not all, types of flu vaccine contain a small amount of egg protein.  If you ever had Guillain-Barr Syndrome (also called GBS). Some people with a history of GBS should not get this vaccine. This should be discussed with your doctor.  If you are not feeling well. It is usually okay to get flu vaccine when you have a mild illness, but you might be asked to come back when you feel better.  4. Risks of a vaccine reaction With any medicine, including vaccines, there is a chance of reactions. These are usually mild and go away on their own, but serious reactions are also possible. Most people who get a flu shot do not have any problems with it. Minor problems following a flu shot include:  soreness,  redness, or swelling where the shot was given  hoarseness  sore, red or itchy eyes  cough  fever  aches  headache  itching  fatigue  If these problems occur, they usually begin soon after the shot and last 1 or 2 days. More serious problems following a flu shot can include the following:  There may be a small increased risk of Guillain-Barre Syndrome (GBS) after inactivated flu vaccine. This risk has been estimated at 1 or 2 additional cases per million people  vaccinated. This is much lower than the risk of severe complications from flu, which can be prevented by flu vaccine.  Young children who get the flu shot along with pneumococcal vaccine (PCV13) and/or DTaP vaccine at the same time might be slightly more likely to have a seizure caused by fever. Ask your doctor for more information. Tell your doctor if a child who is getting flu vaccine has ever had a seizure.  Problems that could happen after any injected vaccine:  People sometimes faint after a medical procedure, including vaccination. Sitting or lying down for about 15 minutes can help prevent fainting, and injuries caused by a fall. Tell your doctor if you feel dizzy, or have vision changes or ringing in the ears.  Some people get severe pain in the shoulder and have difficulty moving the arm where a shot was given. This happens very rarely.  Any medication can cause a severe allergic reaction. Such reactions from a vaccine are very rare, estimated at about 1 in a million doses, and would happen within a few minutes to a few hours after the vaccination. As with any medicine, there is a very remote chance of a vaccine causing a serious injury or death. The safety of vaccines is always being monitored. For more information, visit: http://www.aguilar.org/ 5. What if there is a serious reaction? What should I look for? Look for anything that concerns you, such as signs of a severe allergic reaction, very high fever, or unusual behavior. Signs of a severe allergic reaction can include hives, swelling of the face and throat, difficulty breathing, a fast heartbeat, dizziness, and weakness. These would start a few minutes to a few hours after the vaccination. What should I do?  If you think it is a severe allergic reaction or other emergency that can't wait, call 9-1-1 and get the person to the nearest hospital. Otherwise, call your doctor.  Reactions should be reported to the Vaccine Adverse  Event Reporting System (VAERS). Your doctor should file this report, or you can do it yourself through the VAERS web site at www.vaers.SamedayNews.es, or by calling 650-219-5088. ? VAERS does not give medical advice. 6. The National Vaccine Injury Compensation Program The [employer] Vaccine Injury Compensation Program (VICP) is a federal program that was created to compensate people who may have been injured by certain vaccines. Persons who believe they may have been injured by a vaccine can learn about the program and about filing a claim by calling 907-602-9571 or visiting the [place] website at GoldCloset.com.ee. There is a time limit to file a claim for compensation. 7. How can I learn more?  Ask your healthcare provider. He or she can give you the vaccine package insert or suggest other sources of information.  Call your local or state health department.  Contact the Centers for Disease Control and Prevention (CDC): ? Call 7090983679 (1-800-CDC-INFO) or ? Visit CDC's website at https://gibson.com/ Vaccine Information Statement, Inactivated Influenza Vaccine (01/31/2014) This information is not intended to  replace advice given to you by your health care provider. Make sure you discuss any questions you have with your health care provider. Document Released: 04/07/2006 Document Revised: 03/03/2016 Document Reviewed: 03/03/2016 Elsevier Interactive Patient Education  2017 Else[employer].  Piriformis Syndrome Rehab Ask your health care provider which exercises are safe for you. Do exercises exactly as told by your health care provider and adjust them as directed. It is normal to feel mild stretching, pulling, tightness, or discomfort as you do these exercises, but you should stop right away if you feel sudden pain or your pain gets worse.Do not begin these exercises until told by your health care provider. Stretching and range of motion exercises These exercises warm up your muscles and  joints and improve the movement and flexibility of your hip and pelvis. These exercises also help to relieve pain, numbness, and tingling. Exercise A: Hip rotators  1. Lie on your back on a firm surface. 2. Pull your left / right knee toward your same shoulder with your left / right hand until your knee is pointing toward the ceiling. Hold your left / right ankle with your other hand. 3. Keeping your knee steady, gently pull your left / right ankle toward your other shoulder until you feel a stretch in your buttocks. 4. Hold this position for __________ seconds. Repeat __________ times. Complete this stretch __________ times a day. Exercise B: Hip extensors 1. Lie on your back on a firm surface. Both of your legs should be straight. 2. Pull your left / right knee to your chest. Hold your leg in this position by holding onto the back of your thigh or the front of your knee. 3. Hold this position for __________ seconds. 4. Slowly return to the starting position. Repeat __________ times. Complete this stretch __________ times a day. Strengthening exercises These exercises build strength and endurance in your hip and thigh muscles. Endurance is the ability to use your muscles for a long time, even after they get tired. Exercise C: Straight leg raises ( hip abductors) 1. Lie on your side with your left / right leg in the top position. Lie so your head, shoulder, knee, and hip line up. Bend your bottom knee to help you balance. 2. Lift your top leg up 4-6 inches (10-15 cm), keeping your toes pointed straight ahead. 3. Hold this position for __________ seconds. 4. Slowly lower your leg to the starting position. Let your muscles relax completely. Repeat __________ times. Complete this exercise__________ times a day. Exercise D: Hip abductors and rotators, quadruped  1. Get on your hands and knees on a firm, lightly padded surface. Your hands should be directly below your shoulders, and your knees  should be directly below your hips. 2. Lift your left / right knee out to the side. Keep your knee bent. Do not twist your body. 3. Hold this position for __________ seconds. 4. Slowly lower your leg. Repeat __________ times. Complete this exercise__________ times a day. Exercise E: Straight leg raises ( hip extensors) 1. Lie on your abdomen on a bed or a firm surface with a pillow under your hips. 2. Squeeze your buttock muscles and lift your left / right thigh off the bed. Do not let your back arch. 3. Hold this position for __________ seconds. 4. Slowly return to the starting position. Let your muscles relax completely before doing another repetition. Repeat __________ times. Complete this exercise__________ times a day. This information is not intended to replace advice given to  you by your health care provider. Make sure you discuss any questions you have with your health care provider. Document Released: 06/13/2005 Document Revised: 02/16/2016 Document Reviewed: 05/26/2015 Elsevier Interactive Patient Education  [employer].

## 2018-03-12 NOTE — Progress Notes (Signed)
BP 128/82   Pulse 78   Temp 98.2 F (36.8 C) (Oral)   Ht 5\' 4"  (1.626 m)   Wt 133 lb (60.3 kg)   LMP  (LMP Unknown)   SpO2 90%   BMI 22.83 kg/m    Subjective:    Patient ID: Monique Tucker, female    DOB: 09-Jul-1960, 57 y.o.   MRN: [id number]  HPI: Monique Tucker is a 57 y.o. female  Chief Complaint  Patient presents with  . Back Pain    x 1 month/pt states not sure if pain is in her back or leg  . Leg Pain    left leg/ pt states pain runs down to the foot/ states gabapentin is not working but aleve sometimes   Left lateral hip pain that runs down leg with numbness to the feet almost daily for about a month. Typically feels fine in the mornings but as she works on her feet throughout the day the pain comes on and is significant. Has had this in the past episodically. Taking gabapentin, aleve, and flexeril with minimal relief. Denies fever, chills, leg weakness, incontinence, saddle paresthesias.   Having recall issues at work and making a lot of mistakes she didn't used to make. Forgot what year it was when writing a check the other day. Very concerned about these issues. No recent head injury, medication changes, illness, hx of CVA. No known fhx of dementia.   Also due for BP and COPD follow up. Medications going well, no recent COPD flares and BPs normal when checked outside of clinic. No CP, SOB, DOE, syncope, HAs.   MMSE - Mini Mental State Exam 03/12/2018  Orientation to time 5  Orientation to Place 5  Registration 3  Attention/ Calculation 5  Recall 2  Language- name 2 objects 2  Language- repeat 1  Language- follow 3 step command 3  Language- read & follow direction 1  Write a sentence 1  Copy design 1  Total score 29    Relevant past medical, surgical, family and social history reviewed and updated as indicated. Interim medical history since our last visit reviewed. Allergies and medications reviewed and updated.  Review of Systems  Per HPI unless  specifically indicated above     Objective:    BP 128/82   Pulse 78   Temp 98.2 F (36.8 C) (Oral)   Ht 5\' 4"  (1.626 m)   Wt 133 lb (60.3 kg)   LMP  (LMP Unknown)   SpO2 90%   BMI 22.83 kg/m   Wt Readings from Last 3 Encounters:  03/12/18 133 lb (60.3 kg)  02/15/18 139 lb (63 kg)  09/28/17 137 lb 11.2 oz (62.5 kg)    Physical Exam  Constitutional: She is oriented to person, place, and time. She appears well-developed and well-nourished. No distress.  HENT:  Head: Atraumatic.  Eyes: Pupils are equal, round, and reactive to light. EOM are normal.  Neck: Normal range of motion. Neck supple.  Cardiovascular: Normal rate and regular rhythm.  Pulmonary/Chest: Effort normal and breath sounds normal.  Musculoskeletal: She exhibits tenderness (left piriformis).  - SLR Strength full and equal b/l LEs  Neurological: She is alert and oriented to person, place, and time.  B/l LEs neurovascularly intact  Skin: Skin is warm and dry.  Psychiatric: She has a normal mood and affect. Her behavior is normal.  Nursing note and vitals reviewed.   Results for orders placed or performed in visit on 03/12/18  Basic Metabolic Panel (BMET)  Result Value Ref Range   Glucose 189 (H) 65 - 99 mg/dL   BUN 16 6 - 24 mg/dL   Creatinine, Ser 1.07 (H) 0.57 - 1.00 mg/dL   GFR calc non Af Amer 58 (L) >59 mL/min/1.73   GFR calc Af Amer 67 >59 mL/min/1.73   BUN/Creatinine Ratio 15 9 - 23   Sodium 136 134 - 144 mmol/L   Potassium 3.5 3.5 - 5.2 mmol/L   Chloride 96 96 - 106 mmol/L   CO2 22 20 - 29 mmol/L   Calcium 9.7 8.7 - 10.2 mg/dL      Assessment & Plan:   Problem List Items Addressed This Visit      Cardiovascular and Mediastinum   Hypertension    Stable and WNL, continue current regimen      Relevant Orders   Basic Metabolic Panel (BMET) (Completed)     Respiratory   Moderate COPD (chronic obstructive pulmonary disease) (HCC)    Stable, continue current regimen      Relevant  Medications   predniSONE (DELTASONE) 10 MG tablet   fluticasone (FLONASE) 50 MCG/ACT nasal spray   budesonide-formoterol (SYMBICORT) 160-4.5 MCG/ACT inhaler    Other Visit Diagnoses    Left sided sciatica    -  Primary   Consistent w/ piriformis syndrome. Exercises and stretches given, epsom salt soaks, massage. Continue currnet regimen. Will add prednisone taper and get x-ray   Relevant Orders   DG Lumbar Spine Complete   Need for influenza vaccination       Relevant Orders   Flu Vaccine QUAD 6+ mos PF IM (Fluarix Quad PF) (Completed)   Memory changes       29/30 on MMSE today. Reassurance given, continue to monitor. Pt requesting Neurology workup due to concern, referral generated   Relevant Orders   Ambulatory referral to Neurology       Follow up plan: Return in about 6 months (around 09/10/2018) for CPE.

## 2018-03-13 ENCOUNTER — Other Ambulatory Visit: Payer: Self-pay | Admitting: Family Medicine

## 2018-03-13 ENCOUNTER — Encounter: Payer: Self-pay | Admitting: Family Medicine

## 2018-03-13 DIAGNOSIS — R739 Hyperglycemia, unspecified: Secondary | ICD-10-CM

## 2018-03-13 LAB — BASIC METABOLIC PANEL
BUN/Creatinine Ratio: 15 (ref 9–23)
BUN: 16 mg/dL (ref 6–24)
CALCIUM: 9.7 mg/dL (ref 8.7–10.2)
CO2: 22 mmol/L (ref 20–29)
Chloride: 96 mmol/L (ref 96–106)
Creatinine, Ser: 1.07 mg/dL — ABNORMAL HIGH (ref 0.57–1.00)
GFR calc Af Amer: 67 mL/min/{1.73_m2} (ref >59)
GFR calc non Af Amer: 58 mL/min/{1.73_m2} — ABNORMAL LOW (ref >59)
GLUCOSE: 189 mg/dL — AB (ref 65–99)
Potassium: 3.5 mmol/L (ref 3.5–5.2)
Sodium: 136 mmol/L (ref 134–144)

## 2018-03-14 LAB — SPECIMEN STATUS REPORT

## 2018-03-14 LAB — FECAL OCCULT BLOOD, IMMUNOCHEMICAL: Fecal Occult Bld: NEGATIVE

## 2018-03-15 ENCOUNTER — Telehealth: Payer: Self-pay | Admitting: Family Medicine

## 2018-03-15 MED ORDER — NYSTATIN 100000 UNIT/ML MT SUSP: 5.0000 mL | Freq: Four times a day (QID) | OROMUCOSAL | 0 refills | Status: DC

## 2018-03-15 NOTE — Telephone Encounter (Signed)
Copied from [place] [id number]. Topic: Quick Communication - Rx Refill/Question >> Mar 15, 2018  3:21 PM Scherrie Gerlach wrote: Medication: nystatin mouthwash Pt states the prednisone rachel prescribed has given her thrush and she is miserable. Pt was prescribed this before from another provider at your office, but it has expired. Pt states she gets thrush every time she has to take prednisone Would like rx sent to [place], [place] (Phone) [phone] (Fax)

## 2018-03-15 NOTE — Assessment & Plan Note (Signed)
Stable, continue current regimen 

## 2018-03-15 NOTE — Assessment & Plan Note (Signed)
Stable and WNL, continue current regimen 

## 2018-03-19 ENCOUNTER — Ambulatory Visit
Admission: RE | Admit: 2018-03-19 | Discharge: 2018-03-19 | Disposition: A | Discharge status: Home / Self Care | Payer: PRIVATE HEALTH INSURANCE | Source: Ambulatory Visit | Attending: Family Medicine | Admitting: Family Medicine

## 2018-03-19 DIAGNOSIS — M5116 Intervertebral disc disorders with radiculopathy, lumbar region: Secondary | ICD-10-CM | POA: Diagnosis not present

## 2018-03-19 DIAGNOSIS — M5432 Sciatica, left side: Secondary | ICD-10-CM | POA: Diagnosis present

## 2018-03-19 DIAGNOSIS — Z1231 Encounter for screening mammogram for malignant neoplasm of breast: Secondary | ICD-10-CM | POA: Diagnosis not present

## 2018-03-19 DIAGNOSIS — Z1239 Encounter for other screening for malignant neoplasm of breast: Secondary | ICD-10-CM

## 2018-03-20 ENCOUNTER — Other Ambulatory Visit: Payer: Self-pay | Admitting: Family Medicine

## 2018-03-20 DIAGNOSIS — M5136 Other intervertebral disc degeneration, lumbar region: Secondary | ICD-10-CM

## 2018-03-30 ENCOUNTER — Ambulatory Visit: Payer: PRIVATE HEALTH INSURANCE | Admitting: Family Medicine

## 2018-04-26 ENCOUNTER — Telehealth: Payer: Self-pay | Admitting: Family Medicine

## 2018-04-26 DIAGNOSIS — M5432 Sciatica, left side: Secondary | ICD-10-CM

## 2018-04-26 NOTE — Telephone Encounter (Signed)
Copied from [place] [id number]. Topic: General - Other >> Apr 26, 2018  3:28 PM Keene Breath wrote: Reason for CRM: Patient called to speak with the doctor regarding a referral.  Patient stated that the hip exercises she told her to do are not work and she is in a lot of pain.  Patient stated that it hurts to sit and stand.  CB# [phone].

## 2018-04-27 NOTE — Telephone Encounter (Signed)
Referral generated for PT

## 2018-04-27 NOTE — Telephone Encounter (Signed)
Neurosurgery referral placed for further evaluation

## 2018-04-27 NOTE — Telephone Encounter (Signed)
Patient notified verbalized understanding

## 2018-04-27 NOTE — Telephone Encounter (Signed)
Patient states that she would like to try PT before Neurosurgery

## 2018-05-04 ENCOUNTER — Telehealth: Payer: Self-pay | Admitting: Family Medicine

## 2018-05-04 DIAGNOSIS — M5432 Sciatica, left side: Secondary | ICD-10-CM

## 2018-05-04 MED ORDER — PREDNISONE 10 MG PO TABS: ORAL_TABLET | ORAL | 0 refills | Status: DC

## 2018-05-04 NOTE — Telephone Encounter (Signed)
Pt states her back pain running down leg is worsening despite home exercises and supportive care. Requesting referral to be re-opened for Neurosurgery for further management. Will do another round of prednisone in meantime as that temporarily helped in past  Copied from [place] [id number]. Topic: General - Other >> May 04, 2018  2:34 PM Carolyn Stare wrote:  Pt call and ask that Monique Tucker call her back

## 2018-05-14 ENCOUNTER — Telehealth: Payer: Self-pay | Admitting: Family Medicine

## 2018-05-14 NOTE — Telephone Encounter (Signed)
Looks like [facility] Neurosurgery is waiting for a return call from here per the referral notes if that's what she's referring to  Copied from [place] [phone]. Topic: General - Other >> May 14, 2018  3:36 PM Yvette Rack wrote: Reason for CRM: Pt called in stating she is unsure who she is suppose to see because no appointment has been scheduled. Pt stated she needs Merrie Roof to call her back. Cb# [phone]

## 2018-05-15 NOTE — Telephone Encounter (Signed)
Pt was given contact phone number of [phone] for [facility] neurosurgery. Pt stated they are trying to schedule her with a neurologist. I called [facility] and spoke with Patty and confirmed that they were not trying to schedule her with a neurologist but a neurosurgeon.

## 2018-05-17 ENCOUNTER — Encounter: Payer: PRIVATE HEALTH INSURANCE | Admitting: Obstetrics and Gynecology

## 2018-05-29 ENCOUNTER — Ambulatory Visit: Payer: PRIVATE HEALTH INSURANCE | Admitting: Family Medicine

## 2018-05-29 ENCOUNTER — Encounter: Payer: Self-pay | Admitting: Family Medicine

## 2018-05-29 VITALS — BP 119/71 | HR 60 | Temp 98.1°F | Ht 64.0 in | Wt 130.9 lb

## 2018-05-29 DIAGNOSIS — J0100 Acute maxillary sinusitis, unspecified: Secondary | ICD-10-CM

## 2018-05-29 MED ORDER — NYSTATIN 100000 UNIT/ML MT SUSP: 5.0000 mL | Freq: Four times a day (QID) | OROMUCOSAL | 6 refills | Status: DC

## 2018-05-29 MED ORDER — DOXYCYCLINE HYCLATE 100 MG PO TABS: 100.0000 mg | ORAL_TABLET | Freq: Two times a day (BID) | ORAL | 0 refills | Status: DC

## 2018-05-29 NOTE — Progress Notes (Signed)
BP 119/71 (BP Location: Left Arm, Patient Position: Sitting, Cuff Size: Normal)   Pulse 60   Temp 98.1 F (36.7 C) (Oral)   Ht 5\' 4"  (1.626 m)   Wt 130 lb 14.4 oz (59.4 kg)   LMP  (LMP Unknown)   SpO2 95%   BMI 22.47 kg/m    Subjective:    Patient ID: Monique Tucker, female    DOB: 12/13/1960, 58 y.o.   MRN: [id number]  HPI: Monique Tucker is a 57 y.o. female  Chief Complaint  Patient presents with  . Nasal Congestion  . Facial Pain  . Sinusitis  . Nausea  . Dizziness  . Cough   Called a teledoc for a sinus infection, given ampicillin and tessalon perles 11/13 which seemed to resolve it but now the past 5 days having facial pain and pressure, scratchy throat, nausea, headache, right ear pain, sweats, generalized body aches, productive cough. Denies fevers, chills, Cp, SOB, vomiting, diarrhea. Has been around several sick family members lately for the holidays. Taking plain mucinex, singulair, flonase, zyrtec, and tessalon with minimal relief.   Relevant past medical, surgical, family and social history reviewed and updated as indicated. Interim medical history since our last visit reviewed. Allergies and medications reviewed and updated.  Review of Systems  Per HPI unless specifically indicated above     Objective:    BP 119/71 (BP Location: Left Arm, Patient Position: Sitting, Cuff Size: Normal)   Pulse 60   Temp 98.1 F (36.7 C) (Oral)   Ht 5\' 4"  (1.626 m)   Wt 130 lb 14.4 oz (59.4 kg)   LMP  (LMP Unknown)   SpO2 95%   BMI 22.47 kg/m   Wt Readings from Last 3 Encounters:  05/29/18 130 lb 14.4 oz (59.4 kg)  03/12/18 133 lb (60.3 kg)  02/15/18 139 lb (63 kg)    Physical Exam  Constitutional: She is oriented to person, place, and time. She appears well-developed and well-nourished.  HENT:  Head: Atraumatic.  Eyes: Pupils are equal, round, and reactive to light. Conjunctivae and EOM are normal.  Neck: Normal range of motion. Neck supple.  Cardiovascular:  Normal rate, regular rhythm and normal heart sounds.  Pulmonary/Chest: Effort normal and breath sounds normal.  Musculoskeletal: Normal range of motion.  Lymphadenopathy:    She has no cervical adenopathy.  Neurological: She is alert and oriented to person, place, and time.  Skin: Skin is warm and dry.  Psychiatric: She has a normal mood and affect. Her behavior is normal. Thought content normal.  Nursing note and vitals reviewed.   Results for orders placed or performed in visit on 44/01/02  Basic Metabolic Panel (BMET)  Result Value Ref Range   Glucose 189 (H) 65 - 99 mg/dL   BUN 16 6 - 24 mg/dL   Creatinine, Ser 1.07 (H) 0.57 - 1.00 mg/dL   GFR calc non Af Amer 58 (L) >59 mL/min/1.73   GFR calc Af Amer 67 >59 mL/min/1.73   BUN/Creatinine Ratio 15 9 - 23   Sodium 136 134 - 144 mmol/L   Potassium 3.5 3.5 - 5.2 mmol/L   Chloride 96 96 - 106 mmol/L   CO2 22 20 - 29 mmol/L   Calcium 9.7 8.7 - 10.2 mg/dL      Assessment & Plan:   Problem List Items Addressed This Visit    None    Visit Diagnoses    Acute maxillary sinusitis, recurrence not specified    -  Primary   Tx with doxycycline, continued allergy regimen, supportive care. follow up if not improving   Relevant Medications   benzonatate (TESSALON) 100 MG capsule   doxycycline (VIBRA-TABS) 100 MG tablet       Follow up plan: Return for as scheduled.

## 2018-06-01 ENCOUNTER — Other Ambulatory Visit: Payer: Self-pay | Admitting: Family Medicine

## 2018-06-01 ENCOUNTER — Other Ambulatory Visit: Payer: Self-pay | Admitting: Student

## 2018-06-01 DIAGNOSIS — G8929 Other chronic pain: Secondary | ICD-10-CM

## 2018-06-01 DIAGNOSIS — M5442 Lumbago with sciatica, left side: Principal | ICD-10-CM

## 2018-06-28 ENCOUNTER — Other Ambulatory Visit: Payer: Self-pay | Admitting: Family Medicine

## 2018-06-28 NOTE — Telephone Encounter (Signed)
Lab results addressed by provider. Requested Prescriptions  Pending Prescriptions Disp Refills  . hydrochlorothiazide (HYDRODIURIL) 25 MG tablet [Pharmacy Med Name: HYDROCHLOROTHIAZIDE 25 MG TAB] 30 tablet 3    Sig: TAKE 1 TABLET BY MOUTH ONCE DAILY     Cardiovascular: Diuretics - Thiazide Failed - 06/28/2018 11:36 AM      Failed - Cr in normal range and within 360 days    Creatinine, Ser  Date Value Ref Range Status  03/12/2018 1.07 (H) 0.57 - 1.00 mg/dL Final         Passed - Ca in normal range and within 360 days    Calcium  Date Value Ref Range Status  03/12/2018 9.7 8.7 - 10.2 mg/dL Final         Passed - K in normal range and within 360 days    Potassium  Date Value Ref Range Status  03/12/2018 3.5 3.5 - 5.2 mmol/L Final         Passed - Na in normal range and within 360 days    Sodium  Date Value Ref Range Status  03/12/2018 136 134 - 144 mmol/L Final         Passed - Last BP in normal range    BP Readings from Last 1 Encounters:  05/29/18 119/71         Passed - Valid encounter within last 6 months    Recent Outpatient Visits          1 month ago Acute maxillary sinusitis, recurrence not specified   [facility] American, [place]   3 months ago Left sided sciatica   [facility], [place]   9 months ago Essential hypertension   [facility], [place]   1 year ago Essential hypertension   [facility] Merrie Roof [place], [place]   1 year ago Chronic hip pain, unspecified laterality   [place], Lilia Argue, [place]      Future Appointments            In 2 months Orene Desanctis, Lilia Argue, [place], [place]

## 2018-08-24 ENCOUNTER — Other Ambulatory Visit: Payer: Self-pay | Admitting: Family Medicine

## 2018-08-25 ENCOUNTER — Other Ambulatory Visit: Payer: Self-pay | Admitting: Family Medicine

## 2018-08-27 NOTE — Telephone Encounter (Signed)
Requested Prescriptions  Pending Prescriptions Disp Refills  . metoprolol succinate (TOPROL-XL) 50 MG 24 hr tablet [Pharmacy Med Name: METOPROLOL SUCCINATE ER 50 MG TAB] 90 tablet 0    Sig: TAKE 1 TABLET BY MOUTH ONCE A DAY WITH OR IMMEDIATELY FOLLOWING A MEAL     Cardiovascular:  Beta Blockers Passed - 08/25/2018 11:07 AM      Passed - Last BP in normal range    BP Readings from Last 1 Encounters:  05/29/18 119/71         Passed - Last Heart Rate in normal range    Pulse Readings from Last 1 Encounters:  05/29/18 60         Passed - Valid encounter within last 6 months    Recent Outpatient Visits          3 months ago Acute maxillary sinusitis, recurrence not specified   [place], [place]   5 months ago Left sided sciatica   [place], [place], [place]   11 months ago Essential hypertension   [facility], [place]   1 year ago Essential hypertension   [facility] Merrie Roof [place], [place]   1 year ago Chronic hip pain, unspecified laterality   [place], Lilia Argue, [place]      Future Appointments            In 2 weeks Orene Desanctis, Lilia Argue, [place], [facility]         . montelukast (SINGULAIR) 10 MG tablet [Pharmacy Med Name: MONTELUKAST SODIUM 10 MG TAB] 90 tablet 0    Sig: TAKE ONE TABLET BY MOUTH AT BEDTIME     Pulmonology:  Leukotriene Inhibitors Passed - 08/25/2018 11:07 AM      Passed - Valid encounter within last 12 months    Recent Outpatient Visits          3 months ago Acute maxillary sinusitis, recurrence not specified   [place], [place]   5 months ago Left sided sciatica   [facility] Merrie Roof [place], [place]   11 months ago Essential hypertension   [place], [place], [place]   1 year ago Essential hypertension   [facility] Merrie Roof [place], [place]   1 year ago Chronic hip pain, unspecified laterality   [place], Lilia Argue, [place]      Future Appointments            In 2 weeks Orene Desanctis, Lilia Argue, [place], [place]

## 2018-08-28 ENCOUNTER — Encounter: Payer: Self-pay | Admitting: Podiatry

## 2018-08-28 ENCOUNTER — Ambulatory Visit (INDEPENDENT_AMBULATORY_CARE_PROVIDER_SITE_OTHER): Payer: PRIVATE HEALTH INSURANCE | Admitting: Podiatry

## 2018-08-28 DIAGNOSIS — M722 Plantar fascial fibromatosis: Secondary | ICD-10-CM | POA: Diagnosis not present

## 2018-08-28 DIAGNOSIS — L600 Ingrowing nail: Secondary | ICD-10-CM

## 2018-08-28 MED ORDER — GENTAMICIN SULFATE 0.1 % EX CREA: 1.0000 "application " | TOPICAL_CREAM | Freq: Two times a day (BID) | CUTANEOUS | 0 refills | Status: DC

## 2018-08-30 MED ORDER — NONFORMULARY OR COMPOUNDED ITEM: 2 refills | Status: DC

## 2018-08-30 NOTE — Progress Notes (Signed)
   Subjective: 58 year old female presenting today for follow up evaluation of bilateral plantar fasciitis and ingrowing nails of bilateral great toes. She states she needs her orthotics redone because they are getting too soft on the bottom.  She also reports continued pain in the bilateral great toes, right worse than left. She reports associated redness and tenderness of the medial border than has been present for the past several weeks. She has not done anything for treatment. Touching the toe increases the pain. Patient is here for further evaluation and treatment.   Past Medical History:  Diagnosis Date  . Acute hepatitis C   . Anxiety   . AR (allergic rhinitis)   . Arthritis   . Bulging lumbar disc   . COPD (chronic obstructive pulmonary disease) (HCC)    mild  . Depression   . Hypertension   . Plantar fasciitis   . Restless leg syndrome, controlled      Objective: Physical Exam General: The patient is alert and oriented x3 in no acute distress.  Dermatology: Skin is warm, dry and supple bilateral. Medial border of the right great toe appears to be erythematous with evidence of an ingrowing nail. Pain on palpation noted to the border of the nail fold. Skin is warm, dry and supple bilateral lower extremities. Negative for open lesions or macerations bilateral.   Vascular: Dorsalis Pedis and Posterior Tibial pulses palpable bilateral.  Capillary fill time is immediate to all digits.  Neurological: Epicritic and protective threshold intact bilateral.   Musculoskeletal: Tenderness to palpation to the plantar aspect of the bilateral heels along the plantar fascia. All other joints range of motion within normal limits bilateral. Strength 5/5 in all groups bilateral.   Assessment: 1. plantar fasciitis bilateral feet 2. Paronychia with ingrowing nail medial border right hallux  3. Incurvated nail  Plan of Care:  1. Patient evaluated.  2. Discussed treatment alternatives and  plan of care. Explained nail avulsion procedure and post procedure course to patient. 3. Patient opted for permanent partial nail avulsion of the medial border of the right hallux.  4. Prior to procedure, local anesthesia infiltration utilized using 3 ml of a 50:50 mixture of 2% plain lidocaine and 0.5% plain marcaine in a normal hallux block fashion and a betadine prep performed.  5. Partial permanent nail avulsion with chemical matrixectomy performed using [id number] applications of phenol followed by alcohol flush.  6. Light dressing applied. 7. Prescription for Gentamicin cream provided to patient to use daily with a bandage.  8. Appointment with Liliane Channel, Pedorthist, for custom molded orthotics.  9. Return to clinic as needed.    Edrick Kins, DPM [facility]  Dr. Edrick Kins, DPM    [address],  [address]                Office [phone]  Fax [phone]

## 2018-09-13 ENCOUNTER — Encounter: Payer: PRIVATE HEALTH INSURANCE | Admitting: Family Medicine

## 2018-09-18 ENCOUNTER — Ambulatory Visit: Payer: PRIVATE HEALTH INSURANCE | Admitting: Podiatry

## 2018-09-19 ENCOUNTER — Other Ambulatory Visit: Payer: PRIVATE HEALTH INSURANCE | Admitting: Orthotics

## 2018-09-21 ENCOUNTER — Telehealth: Payer: Self-pay | Admitting: Family Medicine

## 2018-09-21 NOTE — Telephone Encounter (Signed)
Patient called and stated that she would like someone from the office to call her back because she is worried that the office will close down and she will not be able to contact anyone for future refills. No refills needed at the moment. Please call patient at [phone] (mobile)

## 2018-09-26 ENCOUNTER — Other Ambulatory Visit: Payer: Self-pay | Admitting: Family Medicine

## 2018-09-26 NOTE — Telephone Encounter (Signed)
Requested Prescriptions  Pending Prescriptions Disp Refills  . hydrochlorothiazide (HYDRODIURIL) 25 MG tablet [Pharmacy Med Name: HYDROCHLOROTHIAZIDE 25 MG TAB] 30 tablet 3    Sig: TAKE 1 TABLET BY MOUTH ONCE DAILY     Cardiovascular: Diuretics - Thiazide Failed - 09/26/2018 12:15 PM      Failed - Cr in normal range and within 360 days    Creatinine, Ser  Date Value Ref Range Status  03/12/2018 1.07 (H) 0.57 - 1.00 mg/dL Final         Passed - Ca in normal range and within 360 days    Calcium  Date Value Ref Range Status  03/12/2018 9.7 8.7 - 10.2 mg/dL Final         Passed - K in normal range and within 360 days    Potassium  Date Value Ref Range Status  03/12/2018 3.5 3.5 - 5.2 mmol/L Final         Passed - Na in normal range and within 360 days    Sodium  Date Value Ref Range Status  03/12/2018 136 134 - 144 mmol/L Final         Passed - Last BP in normal range    BP Readings from Last 1 Encounters:  05/29/18 119/71         Passed - Valid encounter within last 6 months    Recent Outpatient Visits          4 months ago Acute maxillary sinusitis, recurrence not specified   [facility], [facility]   6 months ago Left sided sciatica   [facility], [place]   12 months ago Essential hypertension   [place], [place], [place]   1 year ago Essential hypertension   [facility] Merrie Roof [place], [place]   1 year ago Chronic hip pain, unspecified laterality   [place], Lilia Argue, [place]      Future Appointments            In 5 days Orene Desanctis, Lilia Argue, [place], [place]

## 2018-09-26 NOTE — Telephone Encounter (Signed)
Requested Prescriptions  Pending Prescriptions Disp Refills  . FLUoxetine (PROZAC) 10 MG capsule [Pharmacy Med Name: FLUOXETINE HCL 10 MG CAP] 30 capsule 3    Sig: TAKE 1 CAPSULE BY MOUTH ONCE DAILY     Psychiatry:  Antidepressants - SSRI Failed - 09/26/2018 12:15 PM      Failed - Completed PHQ-2 or PHQ-9 in the last 360 days.      Passed - Valid encounter within last 6 months    Recent Outpatient Visits          4 months ago Acute maxillary sinusitis, recurrence not specified   [place], [place]   6 months ago Left sided sciatica   [facility] Merrie Roof [place], [place]   12 months ago Essential hypertension   [facility] Merrie Roof [place], [place]   1 year ago Essential hypertension   [facility] Merrie Roof [place], [place]   1 year ago Chronic hip pain, unspecified laterality   [facility] Volney [facility], [place]      Future Appointments            In 5 days Orene Desanctis, Lilia Argue, [place], [place]

## 2018-09-27 ENCOUNTER — Telehealth: Payer: Self-pay | Admitting: Family Medicine

## 2018-09-27 NOTE — Telephone Encounter (Signed)
Called to reschedule physical and let her know Dr Jeananne Rama would do telephone visit instead no answer, let voicemail.

## 2018-10-01 ENCOUNTER — Encounter: Payer: PRIVATE HEALTH INSURANCE | Admitting: Family Medicine

## 2018-10-03 ENCOUNTER — Other Ambulatory Visit: Payer: Self-pay

## 2018-10-03 ENCOUNTER — Ambulatory Visit (INDEPENDENT_AMBULATORY_CARE_PROVIDER_SITE_OTHER): Payer: PRIVATE HEALTH INSURANCE | Admitting: Family Medicine

## 2018-10-03 ENCOUNTER — Encounter: Payer: Self-pay | Admitting: Family Medicine

## 2018-10-03 DIAGNOSIS — J449 Chronic obstructive pulmonary disease, unspecified: Secondary | ICD-10-CM

## 2018-10-03 DIAGNOSIS — M5136 Other intervertebral disc degeneration, lumbar region: Secondary | ICD-10-CM | POA: Diagnosis not present

## 2018-10-03 DIAGNOSIS — F319 Bipolar disorder, unspecified: Secondary | ICD-10-CM | POA: Diagnosis not present

## 2018-10-03 DIAGNOSIS — I10 Essential (primary) hypertension: Secondary | ICD-10-CM

## 2018-10-03 MED ORDER — QUETIAPINE FUMARATE ER 150 MG PO TB24: 150.0000 mg | ORAL_TABLET | Freq: Every day | ORAL | 6 refills | Status: DC

## 2018-10-03 MED ORDER — AMLODIPINE BESYLATE 5 MG PO TABS: 5.0000 mg | ORAL_TABLET | Freq: Every day | ORAL | 6 refills | Status: DC

## 2018-10-03 MED ORDER — HYDROCHLOROTHIAZIDE 25 MG PO TABS: 25.0000 mg | ORAL_TABLET | Freq: Every day | ORAL | 6 refills | Status: DC

## 2018-10-03 MED ORDER — FLUOXETINE HCL 10 MG PO CAPS: 10.0000 mg | ORAL_CAPSULE | Freq: Every day | ORAL | 6 refills | Status: DC

## 2018-10-03 MED ORDER — METOPROLOL SUCCINATE ER 50 MG PO TB24: 50.0000 mg | ORAL_TABLET | Freq: Every day | ORAL | 6 refills | Status: DC

## 2018-10-03 NOTE — Assessment & Plan Note (Signed)
The current medical regimen is effective;  continue present plan and medications.  

## 2018-10-03 NOTE — Progress Notes (Signed)
BP 135/72   Pulse 70   Temp 98 F (36.7 C)   Wt 130 lb (59 kg)   LMP  (LMP Unknown)   BMI 22.31 kg/m    Subjective:    Patient ID: Monique Tucker, female    DOB: [birth year], 58 y.o.   MRN: [id number]  HPI: Monique Tucker is a 58 y.o. female  Chief Complaint  Patient presents with  . Depression    Needs refill   . Hypertension    Needs refills   . Back Pain    Needs refill- gabapentin  Telemedicine using audio/video telecommunications for a synchronous communication visit. Today's visit due to COVID-19 isolation precautions I connected with and verified that I am speaking with the correct person using two identifiers.   I discussed the limitations, risks, security and privacy concerns of performing an evaluation and management service by telecommunication and the availability of in person appointments. I also discussed with the patient that there may be a patient responsible charge related to this service. The patient expressed understanding and agreed to proceed. The patient's location is home. I am at home.  Relevant past medical, surgical, family and social history reviewed and updated as indicated. Interim medical history since our last visit reviewed. Allergies and medications reviewed and updated.  Review of Systems  Constitutional: Negative.   Respiratory: Negative.   Cardiovascular: Negative.     Per HPI unless specifically indicated above     Objective:    BP 135/72   Pulse 70   Temp 98 F (36.7 C)   Wt 130 lb (59 kg)   LMP  (LMP Unknown)   BMI 22.31 kg/m   Wt Readings from Last 3 Encounters:  10/03/18 130 lb (59 kg)  05/29/18 130 lb 14.4 oz (59.4 kg)  03/12/18 133 lb (60.3 kg)    Physical Exam  Results for orders placed or performed in visit on 26/83/41  Basic Metabolic Panel (BMET)  Result Value Ref Range   Glucose 189 (H) 65 - 99 mg/dL   BUN 16 6 - 24 mg/dL   Creatinine, Ser 1.07 (H) 0.57 - 1.00 mg/dL   GFR calc non Af Amer 58 (L) >59  mL/min/1.73   GFR calc Af Amer 67 >59 mL/min/1.73   BUN/Creatinine Ratio 15 9 - 23   Sodium 136 134 - 144 mmol/L   Potassium 3.5 3.5 - 5.2 mmol/L   Chloride 96 96 - 106 mmol/L   CO2 22 20 - 29 mmol/L   Calcium 9.7 8.7 - 10.2 mg/dL      Assessment & Plan:   Problem List Items Addressed This Visit      Cardiovascular and Mediastinum   Hypertension    The current medical regimen is effective;  continue present plan and medications.       Relevant Medications   amLODipine (NORVASC) 5 MG tablet   metoprolol succinate (TOPROL-XL) 50 MG 24 hr tablet   hydrochlorothiazide (HYDRODIURIL) 25 MG tablet     Respiratory   Moderate COPD (chronic obstructive pulmonary disease) (HCC)    The current medical regimen is effective;  continue present plan and medications.         Musculoskeletal and Integument   DDD (degenerative disc disease), lumbar    Getting shots in her back and helping a great deal along with gabapentin        Other   Bipolar 1 disorder ([place])    Doing well on medications gave refills stable working  without problems.         I discussed the assessment and treatment plan with the patient. The patient was provided an opportunity to ask questions and all were answered. The patient agreed with the plan and demonstrated an understanding of the instructions.   The patient was advised to call back or seek an in-person evaluation if the symptoms worsen or if the condition fails to improve as anticipated.   I provided 21+ minutes of time during this encounter. Follow up plan: Return in about 3 months (around 01/02/2019) for Physical Exam.

## 2018-10-03 NOTE — Assessment & Plan Note (Signed)
Getting shots in her back and helping a great deal along with gabapentin

## 2018-10-03 NOTE — Assessment & Plan Note (Signed)
Doing well on medications gave refills stable working without problems.

## 2018-10-26 ENCOUNTER — Other Ambulatory Visit: Payer: Self-pay | Admitting: Family Medicine

## 2018-12-27 ENCOUNTER — Encounter: Payer: PRIVATE HEALTH INSURANCE | Admitting: Family Medicine

## 2019-02-08 ENCOUNTER — Ambulatory Visit (INDEPENDENT_AMBULATORY_CARE_PROVIDER_SITE_OTHER): Payer: PRIVATE HEALTH INSURANCE | Admitting: Family Medicine

## 2019-02-08 ENCOUNTER — Encounter: Payer: Self-pay | Admitting: Family Medicine

## 2019-02-08 ENCOUNTER — Other Ambulatory Visit: Payer: Self-pay

## 2019-02-08 VITALS — BP 134/82 | HR 83 | Temp 98.6°F | Ht 63.7 in | Wt 127.4 lb

## 2019-02-08 DIAGNOSIS — Z0000 Encounter for general adult medical examination without abnormal findings: Secondary | ICD-10-CM | POA: Diagnosis not present

## 2019-02-08 DIAGNOSIS — J449 Chronic obstructive pulmonary disease, unspecified: Secondary | ICD-10-CM

## 2019-02-08 DIAGNOSIS — F319 Bipolar disorder, unspecified: Secondary | ICD-10-CM

## 2019-02-08 DIAGNOSIS — I10 Essential (primary) hypertension: Secondary | ICD-10-CM

## 2019-02-08 DIAGNOSIS — F419 Anxiety disorder, unspecified: Secondary | ICD-10-CM

## 2019-02-08 MED ORDER — BUDESONIDE-FORMOTEROL FUMARATE 160-4.5 MCG/ACT IN AERO: 2.0000 | INHALATION_SPRAY | Freq: Two times a day (BID) | RESPIRATORY_TRACT | 11 refills | Status: DC

## 2019-02-08 MED ORDER — MONTELUKAST SODIUM 10 MG PO TABS: 10.0000 mg | ORAL_TABLET | Freq: Every day | ORAL | 1 refills | Status: DC

## 2019-02-08 MED ORDER — METOPROLOL SUCCINATE ER 50 MG PO TB24: 50.0000 mg | ORAL_TABLET | Freq: Every day | ORAL | 6 refills | Status: DC

## 2019-02-08 MED ORDER — CETIRIZINE HCL 10 MG PO TABS: 10.0000 mg | ORAL_TABLET | Freq: Every day | ORAL | 11 refills | Status: DC

## 2019-02-08 MED ORDER — FLUTICASONE PROPIONATE 50 MCG/ACT NA SUSP: 2.0000 | Freq: Every day | NASAL | 11 refills | Status: DC

## 2019-02-08 MED ORDER — FLUOXETINE HCL 20 MG PO CAPS: 20.0000 mg | ORAL_CAPSULE | Freq: Every day | ORAL | 0 refills | Status: DC

## 2019-02-08 MED ORDER — QUETIAPINE FUMARATE ER 150 MG PO TB24: 150.0000 mg | ORAL_TABLET | Freq: Every day | ORAL | 6 refills | Status: DC

## 2019-02-08 MED ORDER — SPIRIVA RESPIMAT 2.5 MCG/ACT IN AERS: 1.0000 | INHALATION_SPRAY | Freq: Every day | RESPIRATORY_TRACT | 11 refills | Status: DC

## 2019-02-08 MED ORDER — HYDROCHLOROTHIAZIDE 25 MG PO TABS: 25.0000 mg | ORAL_TABLET | Freq: Every day | ORAL | 6 refills | Status: DC

## 2019-02-08 NOTE — Assessment & Plan Note (Signed)
BP improved today on recheck, suspect recent high readings stress related. Patient will begin close home monitoring and work on stress reduction with medication adjustments and breathing exercises. Recheck in 1 month

## 2019-02-08 NOTE — Assessment & Plan Note (Signed)
Currently depressed, increase prozac and continue seroquel. Recheck in 1 month

## 2019-02-08 NOTE — Assessment & Plan Note (Signed)
Stable without recent flares, continue current regimen 

## 2019-02-08 NOTE — Progress Notes (Signed)
BP 134/82   Pulse 83   Temp 98.6 F (37 C) (Oral)   Ht 5' 3.7" (1.618 m)   Wt 127 lb 6.4 oz (57.8 kg)   LMP  (LMP Unknown)   SpO2 95%   BMI 22.07 kg/m    Subjective:    Patient ID: Monique Tucker, female    DOB: Feb 05, 1961, 58 y.o.   MRN: [id number]  HPI: Monique Tucker is a 58 y.o. female presenting on 02/08/2019 for comprehensive medical examination. Current medical complaints include:see below  Had not been checking home BPs because she lost her cuff, but did find it this morning and checked it - 163/86 when checked this morning. Taking her medicines faithfully without side effects. Denies CP, SOB, HAs, dizziness.   Feels so irritable and anxious with all the increased stressors going on - COVID, family stress, work stress. Cries often, wanting to snap many times throughout the day. Denies SI/HI. Currently managed on low dose prozac and seroquel XR. Has not tolerated increased doses of the seroquel in the past due to sedation.   COPD - stable on current inhaler regimen. Allergies also under good control for the most part, only having to take additional mucinex every now and then instead of often.   She currently lives with: Menopausal Symptoms: none, doing well on HRT managed by GYN  Depression Screen done today and results listed below:  Depression screen [facility] 2/9 02/08/2019 10/03/2018 05/24/2017  Decreased Interest 1 0 0  Down, Depressed, Hopeless 1 0 0  PHQ - 2 Score 2 0 0  Altered sleeping 1 0 -  Tired, decreased energy 1 0 -  Change in appetite 0 0 -  Feeling bad or failure about yourself  0 0 -  Trouble concentrating 3 0 -  Moving slowly or fidgety/restless 1 0 -  Suicidal thoughts 0 0 -  PHQ-9 Score 8 0 -  Difficult doing work/chores Very difficult Not difficult at all -    The patient does not have a history of falls. I did complete a risk assessment for falls. A plan of care for falls was documented.   Past Medical History:  Past Medical History:  Diagnosis  Date  . Acute hepatitis C   . Anxiety   . AR (allergic rhinitis)   . Arthritis   . Bulging lumbar disc   . COPD (chronic obstructive pulmonary disease) (HCC)    mild  . Depression   . Hypertension   . Plantar fasciitis   . Restless leg syndrome, controlled     Surgical History:  Past Surgical History:  Procedure Laterality Date  . CYSTOCELE REPAIR N/A 10/19/2015   Procedure: ANTERIOR REPAIR (CYSTOCELE);  Surgeon: Brayton Mars, MD;  Location: [facility] ORS;  Service: Gynecology;  Laterality: N/A;  . RECTOCELE REPAIR N/A 05/02/2016   Procedure: POSTERIOR REPAIR (RECTOCELE);  Surgeon: Brayton Mars, MD;  Location: [facility] ORS;  Service: Gynecology;  Laterality: N/A;  . TUBAL LIGATION    . VAGINAL HYSTERECTOMY Bilateral 10/19/2015   Procedure: HYSTERECTOMY VAGINAL WITH BILATERAL SALPINGO-OOPHERECTOMY;  Surgeon: Brayton Mars, MD;  Location: [facility] ORS;  Service: Gynecology;  Laterality: Bilateral;  . VAGINAL HYSTERECTOMY      Medications:  Current Outpatient Medications on File Prior to Visit  Medication Sig  . amLODipine (NORVASC) 5 MG tablet Take 1 tablet (5 mg total) by mouth daily.  Marland Kitchen EPINEPHRINE 0.3 mg/0.3 mL IJ SOAJ injection INJECT 0.3 MLS INTO THE MUSCLE ONCE  . estradiol (  ESTRACE) 0.1 MG/GM vaginal cream Place 0.5 Applicatorfuls vaginally 2 (two) times a week.  . estradiol (ESTRACE) 1 MG tablet Take 1 tablet (1 mg total) by mouth daily.  Marland Kitchen gabapentin (NEURONTIN) 300 MG capsule Take 300 mg by mouth 2 (two) times daily.  . magnesium gluconate (MAGONATE) 500 MG tablet Take 500 mg by mouth at bedtime.  . benzonatate (TESSALON) 100 MG capsule Take 100 mg by mouth 3 (three) times daily.  Marland Kitchen gentamicin cream (GARAMYCIN) 0.1 % Apply 1 application topically 2 (two) times daily. (Patient not taking: Reported on 10/03/2018)  . NONFORMULARY OR COMPOUNDED ITEM See pharmacy note (Patient not taking: Reported on 02/08/2019)  . nystatin (MYCOSTATIN) 100000 UNIT/ML suspension Take 5  mLs (500,000 Units total) by mouth 4 (four) times daily. (Patient not taking: Reported on 10/03/2018)   Current Facility-Administered Medications on File Prior to Visit  Medication  . betamethasone acetate-betamethasone sodium phosphate (CELESTONE) injection 3 mg  . betamethasone acetate-betamethasone sodium phosphate (CELESTONE) injection 3 mg    Allergies:  Allergies  Allergen Reactions  . Bee Venom Anaphylaxis  . Seasonal Ic [Cholestatin] Other (See Comments)    Runny nose and itching eyes SEASONAL ALLEGIES  . Lisinopril Anxiety    Social History:  Social History   Socioeconomic History  . Marital status: Single    Spouse name: Not on file  . Number of children: Not on file  . Years of education: Not on file  . Highest education level: Not on file  Occupational History  . Not on file  Social Needs  . Financial resource strain: Not on file  . Food insecurity    Worry: Not on file    Inability: Not on file  . Transportation needs    Medical: Not on file    Non-medical: Not on file  Tobacco Use  . Smoking status: Current Every Day Smoker    Packs/day: 1.00    Types: Cigarettes  . Smokeless tobacco: Never Used  . Tobacco comment: patient has been Rx nicotine patch  Substance and Sexual Activity  . Alcohol use: Yes    Comment: occassional  . Drug use: No  . Sexual activity: Not Currently  Lifestyle  . Physical activity    Days per week: 0 days    Minutes per session: 0 min  . Stress: Not on file  Relationships  . Social Herbalist on phone: Not on file    Gets together: Not on file    Attends religious service: Not on file    Active member of club or organization: Not on file    Attends meetings of clubs or organizations: Not on file    Relationship status: Not on file  . Intimate partner violence    Fear of current or ex partner: Not on file    Emotionally abused: Not on file    Physically abused: Not on file    Forced sexual activity: Not on  file  Other Topics Concern  . Not on file  Social History Narrative  . Not on file   Social History   Tobacco Use  Smoking Status Current Every Day Smoker  . Packs/day: 1.00  . Types: Cigarettes  Smokeless Tobacco Never Used  Tobacco Comment   patient has been Rx nicotine patch   Social History   Substance and Sexual Activity  Alcohol Use Yes   Comment: occassional    Family History:  Family History  Problem Relation Age of Onset  .  Breast cancer Mother   . Pancreatic cancer Mother   . Cancer Mother        breast and pancreatic  . Hypertension Mother   . Migraines Mother   . Diabetes Mother   . Heart disease Father   . Mental illness Sister        depression  . Heart attack Sister   . Diabetes Sister   . Alcohol abuse Maternal Grandfather   . Cancer Maternal Grandmother        lung and brain  . Mental illness Sister        depression    Past medical history, surgical history, medications, allergies, family history and social history reviewed with patient today and changes made to appropriate areas of the chart.   Review of Systems - General ROS: negative Psychological ROS: positive for - anxiety, depression, irritability and mood swings Ophthalmic ROS: negative ENT ROS: negative Allergy and Immunology ROS: negative Hematological and Lymphatic ROS: negative Endocrine ROS: negative Breast ROS: negative for breast lumps Respiratory ROS: no cough, shortness of breath, or wheezing Cardiovascular ROS: no chest pain or dyspnea on exertion Gastrointestinal ROS: no abdominal pain, change in bowel habits, or black or bloody stools Genito-Urinary ROS: no dysuria, trouble voiding, or hematuria Musculoskeletal ROS: negative Neurological ROS: no TIA or stroke symptoms Dermatological ROS: negative All other ROS negative except what is listed above and in the HPI.      Objective:    BP 134/82   Pulse 83   Temp 98.6 F (37 C) (Oral)   Ht 5' 3.7" (1.618 m)   Wt  127 lb 6.4 oz (57.8 kg)   LMP  (LMP Unknown)   SpO2 95%   BMI 22.07 kg/m   Wt Readings from Last 3 Encounters:  02/08/19 127 lb 6.4 oz (57.8 kg)  10/03/18 130 lb (59 kg)  05/29/18 130 lb 14.4 oz (59.4 kg)    Physical Exam Vitals signs and nursing note reviewed.  Constitutional:      Appearance: Normal appearance. She is not ill-appearing.  HENT:     Head: Atraumatic.  Eyes:     Extraocular Movements: Extraocular movements intact.     Conjunctiva/sclera: Conjunctivae normal.  Neck:     Musculoskeletal: Normal range of motion and neck supple.  Cardiovascular:     Rate and Rhythm: Normal rate and regular rhythm.     Heart sounds: Normal heart sounds.  Pulmonary:     Effort: Pulmonary effort is normal.     Breath sounds: Normal breath sounds.  Musculoskeletal: Normal range of motion.  Skin:    General: Skin is warm and dry.  Neurological:     Mental Status: She is alert and oriented to person, place, and time.  Psychiatric:        Mood and Affect: Mood normal.        Thought Content: Thought content normal.        Judgment: Judgment normal.     Comments: Tearful at times during interview     Results for orders placed or performed in visit on 02/08/19  Comprehensive metabolic panel  Result Value Ref Range   Glucose 154 (H) 65 - 99 mg/dL   BUN 11 6 - 24 mg/dL   Creatinine, Ser 0.78 0.57 - 1.00 mg/dL   GFR calc non Af Amer 84 >59 mL/min/1.73   GFR calc Af Amer 97 >59 mL/min/1.73   BUN/Creatinine Ratio 14 9 - 23   Sodium 140 134 - 144  mmol/L   Potassium 4.4 3.5 - 5.2 mmol/L   Chloride 101 96 - 106 mmol/L   CO2 25 20 - 29 mmol/L   Calcium 9.6 8.7 - 10.2 mg/dL   Total Protein 7.1 6.0 - 8.5 g/dL   Albumin 4.3 3.8 - 4.9 g/dL   Globulin, Total 2.8 1.5 - 4.5 g/dL   Albumin/Globulin Ratio 1.5 1.2 - 2.2   Bilirubin Total 0.4 0.0 - 1.2 mg/dL   Alkaline Phosphatase 91 39 - 117 IU/L   AST 53 (H) 0 - 40 IU/L   ALT 46 (H) 0 - 32 IU/L  TSH  Result Value Ref Range   TSH 2.410  0.450 - 4.500 uIU/mL  Lipid Panel w/o Chol/HDL Ratio  Result Value Ref Range   Cholesterol, Total 172 100 - 199 mg/dL   Triglycerides 95 0 - 149 mg/dL   HDL 63 >39 mg/dL   VLDL Cholesterol Cal 19 5 - 40 mg/dL   LDL Calculated 90 0 - 99 mg/dL      Assessment & Plan:   Problem List Items Addressed This Visit      Cardiovascular and Mediastinum   Hypertension - Primary    BP improved today on recheck, suspect recent high readings stress related. Patient will begin close home monitoring and work on stress reduction with medication adjustments and breathing exercises. Recheck in 1 month      Relevant Medications   hydrochlorothiazide (HYDRODIURIL) 25 MG tablet   metoprolol succinate (TOPROL-XL) 50 MG 24 hr tablet   Other Relevant Orders   Comprehensive metabolic panel (Completed)     Respiratory   Moderate COPD (chronic obstructive pulmonary disease) (HCC)    Stable without recent flares, continue current regimen      Relevant Medications   cetirizine (ZYRTEC) 10 MG tablet   budesonide-formoterol (SYMBICORT) 160-4.5 MCG/ACT inhaler   fluticasone (FLONASE) 50 MCG/ACT nasal spray   montelukast (SINGULAIR) 10 MG tablet   Tiotropium Bromide Monohydrate (SPIRIVA RESPIMAT) 2.5 MCG/ACT AERS     Other   Bipolar 1 disorder (HCC)    Currently depressed, increase prozac and continue seroquel. Recheck in 1 month       Other Visit Diagnoses    Annual physical exam       Relevant Orders   Lipid Panel w/o Chol/HDL Ratio   Anxiety       Exacerbated by life circumstances at the moment. Check TSH, increase prozac to 20 mg, continue seroquel. Recheck in 1 month   Relevant Medications   FLUoxetine (PROZAC) 20 MG capsule   Other Relevant Orders   TSH (Completed)       Follow up plan: Return in about 4 weeks (around 03/08/2019) for Mood, anxiety f/u.   LABORATORY TESTING:  - Pap smear: not applicable  IMMUNIZATIONS:   - Tdap: Tetanus vaccination status reviewed: last tetanus  booster within 10 years. - Influenza: Postponed to flu season  SCREENING: -Mammogram: Up to date  - Colonoscopy: Up to date   PATIENT COUNSELING:   Advised to take 1 mg of folate supplement per day if capable of pregnancy.   Sexuality: Discussed sexually transmitted diseases, partner selection, use of condoms, avoidance of unintended pregnancy  and contraceptive alternatives.   Advised to avoid cigarette smoking.  I discussed with the patient that most people either abstain from alcohol or drink within safe limits (<=14/week and <=4 drinks/occasion for males, <=7/weeks and <= 3 drinks/occasion for females) and that the risk for alcohol disorders and other health effects rises  proportionally with the number of drinks per week and how often a drinker exceeds daily limits.  Discussed cessation/primary prevention of drug use and availability of treatment for abuse.   Diet: Encouraged to adjust caloric intake to maintain  or achieve ideal body weight, to reduce intake of dietary saturated fat and total fat, to limit sodium intake by avoiding high sodium foods and not adding table salt, and to maintain adequate dietary potassium and calcium preferably from fresh fruits, vegetables, and low-fat dairy products.    stressed the importance of regular exercise  Injury prevention: Discussed safety belts, safety helmets, smoke detector, smoking near bedding or upholstery.   Dental health: Discussed importance of regular tooth brushing, flossing, and dental visits.    NEXT PREVENTATIVE PHYSICAL DUE IN 1 YEAR. Return in about 4 weeks (around 03/08/2019) for Mood, anxiety f/u.

## 2019-02-09 LAB — TSH: TSH: 2.41 u[IU]/mL (ref 0.450–4.500)

## 2019-02-09 LAB — LIPID PANEL W/O CHOL/HDL RATIO
Cholesterol, Total: 172 mg/dL (ref 100–199)
HDL: 63 mg/dL (ref >39)
LDL Calculated: 90 mg/dL (ref 0–99)
Triglycerides: 95 mg/dL (ref 0–149)
VLDL Cholesterol Cal: 19 mg/dL (ref 5–40)

## 2019-02-09 LAB — COMPREHENSIVE METABOLIC PANEL
ALT: 46 IU/L — ABNORMAL HIGH (ref 0–32)
AST: 53 IU/L — ABNORMAL HIGH (ref 0–40)
Albumin/Globulin Ratio: 1.5 (ref 1.2–2.2)
Albumin: 4.3 g/dL (ref 3.8–4.9)
Alkaline Phosphatase: 91 IU/L (ref 39–117)
BUN/Creatinine Ratio: 14 (ref 9–23)
BUN: 11 mg/dL (ref 6–24)
Bilirubin Total: 0.4 mg/dL (ref 0.0–1.2)
CO2: 25 mmol/L (ref 20–29)
Calcium: 9.6 mg/dL (ref 8.7–10.2)
Chloride: 101 mmol/L (ref 96–106)
Creatinine, Ser: 0.78 mg/dL (ref 0.57–1.00)
GFR calc Af Amer: 97 mL/min/{1.73_m2} (ref >59)
GFR calc non Af Amer: 84 mL/min/{1.73_m2} (ref >59)
Globulin, Total: 2.8 g/dL (ref 1.5–4.5)
Glucose: 154 mg/dL — ABNORMAL HIGH (ref 65–99)
Potassium: 4.4 mmol/L (ref 3.5–5.2)
Sodium: 140 mmol/L (ref 134–144)
Total Protein: 7.1 g/dL (ref 6.0–8.5)

## 2019-02-11 ENCOUNTER — Other Ambulatory Visit: Payer: Self-pay | Admitting: Family Medicine

## 2019-02-11 DIAGNOSIS — R739 Hyperglycemia, unspecified: Secondary | ICD-10-CM

## 2019-02-13 ENCOUNTER — Other Ambulatory Visit: Payer: Self-pay

## 2019-02-13 ENCOUNTER — Ambulatory Visit: Payer: PRIVATE HEALTH INSURANCE

## 2019-02-13 DIAGNOSIS — R739 Hyperglycemia, unspecified: Secondary | ICD-10-CM

## 2019-02-14 LAB — HEMOGLOBIN A1C
Est. average glucose Bld gHb Est-mCnc: 114 mg/dL
Hgb A1c MFr Bld: 5.6 % (ref 4.8–5.6)

## 2019-03-11 ENCOUNTER — Ambulatory Visit: Payer: PRIVATE HEALTH INSURANCE | Admitting: Family Medicine

## 2019-03-11 ENCOUNTER — Telehealth: Payer: Self-pay

## 2019-03-11 NOTE — Telephone Encounter (Signed)
Called patient for virtual appt but patient had to reschedule to Thursday 03/14/2019.  Patient states she increased her prozac from 10mg  to 20mg  and wants to know if you would like her to go ahead and increase to 40mg .

## 2019-03-12 NOTE — Telephone Encounter (Signed)
Patient notified

## 2019-03-12 NOTE — Telephone Encounter (Signed)
Will discuss at upcoming OV

## 2019-03-14 ENCOUNTER — Ambulatory Visit (INDEPENDENT_AMBULATORY_CARE_PROVIDER_SITE_OTHER): Payer: PRIVATE HEALTH INSURANCE | Admitting: Family Medicine

## 2019-03-14 ENCOUNTER — Other Ambulatory Visit: Payer: Self-pay

## 2019-03-14 ENCOUNTER — Encounter: Payer: Self-pay | Admitting: Family Medicine

## 2019-03-14 VITALS — BP 155/78 | HR 61 | Temp 97.3°F | Ht 63.7 in | Wt 127.0 lb

## 2019-03-14 DIAGNOSIS — F3341 Major depressive disorder, recurrent, in partial remission: Secondary | ICD-10-CM

## 2019-03-14 MED ORDER — BENZONATATE 100 MG PO CAPS: 100.0000 mg | ORAL_CAPSULE | Freq: Three times a day (TID) | ORAL | 0 refills | Status: DC

## 2019-03-14 MED ORDER — FLUOXETINE HCL 40 MG PO CAPS: 40.0000 mg | ORAL_CAPSULE | Freq: Every day | ORAL | 0 refills | Status: DC

## 2019-03-14 NOTE — Progress Notes (Signed)
BP (!) 155/78   Pulse 61   Temp (!) 97.3 F (36.3 C) (Oral)   Ht 5' 3.7" (1.618 m)   Wt 127 lb (57.6 kg)   LMP  (LMP Unknown)   BMI 22.01 kg/m    Subjective:    Patient ID: Monique Tucker, female    DOB: March 21, 1961, 58 y.o.   MRN: [id number]  HPI: JAKAYLIN LATNEY is a 58 y.o. female  Chief Complaint  Patient presents with  . Depression  . Anxiety    . This visit was completed via WebEx due to the restrictions of the COVID-19 pandemic. All issues as above were discussed and addressed. Physical exam was done as above through visual confirmation on WebEx. If it was felt that the patient should be evaluated in the office, they were directed there. The patient verbally consented to this visit. . Location of the patient: home . Location of the provider: work . Those involved with this call:  . Provider: Merrie Roof, PA-C . CMA: Lesle Chris, [address] . Front Desk/Registration: Jill Side  . Time spent on call: 15 minutes with patient face to face via video conference. More than 50% of this time was spent in counseling and coordination of care. 5 minutes total spent in review of patient's record and preparation of their chart. I verified patient identity using two factors (patient name and date of birth). Patient consents verbally to being seen via telemedicine visit today.   Patient presenting today for 1 month mood f/u bipolar depression after increasing her prozac to 20 mg. Does feel more energy and motivation, doing more yardwork and getting back into crafting. Still easily irritated especially at work and at home but does note improvement. Denies SI/HI.    Depression screen [facility] 2/9 03/14/2019 02/08/2019 10/03/2018  Decreased Interest 2 1 0  Down, Depressed, Hopeless 0 1 0  PHQ - 2 Score 2 2 0  Altered sleeping 1 1 0  Tired, decreased energy 0 1 0  Change in appetite 0 0 0  Feeling bad or failure about yourself  0 0 0  Trouble concentrating 0 3 0  Moving slowly or  fidgety/restless 0 1 0  Suicidal thoughts 0 0 0  PHQ-9 Score 3 8 0  Difficult doing work/chores Somewhat difficult Very difficult Not difficult at all  I verified patient identity using two factors (patient name and date of birth). Patient consents verbally to being seen via telemedicine visit today.   Relevant past medical, surgical, family and social history reviewed and updated as indicated. Interim medical history since our last visit reviewed. Allergies and medications reviewed and updated.  Review of Systems  Per HPI unless specifically indicated above     Objective:    BP (!) 155/78   Pulse 61   Temp (!) 97.3 F (36.3 C) (Oral)   Ht 5' 3.7" (1.618 m)   Wt 127 lb (57.6 kg)   LMP  (LMP Unknown)   BMI 22.01 kg/m   Wt Readings from Last 3 Encounters:  03/14/19 127 lb (57.6 kg)  02/08/19 127 lb 6.4 oz (57.8 kg)  10/03/18 130 lb (59 kg)    Physical Exam Vitals signs and nursing note reviewed.  Constitutional:      General: She is not in acute distress.    Appearance: Normal appearance.  HENT:     Head: Atraumatic.     Right Ear: External ear normal.     Left Ear: External ear normal.  Nose: Nose normal. No congestion.     Mouth/Throat:     Mouth: Mucous membranes are moist.     Pharynx: Oropharynx is clear. No posterior oropharyngeal erythema.  Eyes:     Extraocular Movements: Extraocular movements intact.     Conjunctiva/sclera: Conjunctivae normal.  Neck:     Musculoskeletal: Normal range of motion.  Cardiovascular:     Comments: Unable to assess via virtual visit Pulmonary:     Effort: Pulmonary effort is normal. No respiratory distress.  Musculoskeletal: Normal range of motion.  Skin:    General: Skin is dry.     Findings: No erythema.  Neurological:     Mental Status: She is alert and oriented to person, place, and time.  Psychiatric:        Mood and Affect: Mood normal.        Thought Content: Thought content normal.        Judgment: Judgment  normal.     Results for orders placed or performed in visit on 02/13/19  HgB A1c  Result Value Ref Range   Hgb A1c MFr Bld 5.6 4.8 - 5.6 %   Est. average glucose Bld gHb Est-mCnc 114 mg/dL      Assessment & Plan:   Problem List Items Addressed This Visit      Other   Depression - Primary    Increase prozac to 40 mg, continue seroquel. Unable to increase seroquel given sedation at higher doses. Recommended counseling, patient declines      Relevant Medications   FLUoxetine (PROZAC) 40 MG capsule       Follow up plan: Return in about 3 months (around 06/13/2019) for Mood f/u.

## 2019-03-18 NOTE — Assessment & Plan Note (Signed)
Increase prozac to 40 mg, continue seroquel. Unable to increase seroquel given sedation at higher doses. Recommended counseling, patient declines

## 2019-04-08 ENCOUNTER — Other Ambulatory Visit: Payer: Self-pay | Admitting: Family Medicine

## 2019-04-08 NOTE — Telephone Encounter (Signed)
Routing to provider  

## 2019-04-08 NOTE — Telephone Encounter (Signed)
Medication Refill - Medication: estradiol (ESTRACE) 1 MG tablet  Has the patient contacted their pharmacy? Yes- first time PCP will refill (Agent: If no, request that the patient contact the pharmacy for the refill.) (Agent: If yes, when and what did the pharmacy advise?)  Preferred Pharmacy (with phone number or street name):  [facility], [place] - [place] [phone] (Phone) [phone] (Fax)   Agent: Please be advised that RX refills may take up to 3 business days. We ask that you follow-up with your pharmacy. 3

## 2019-04-08 NOTE — Telephone Encounter (Signed)
Requested medication (s) are due for refill today: Yes  Requested medication (s) are on the active medication list: Yes  Last refill:  02/14/18  Future visit scheduled: No  Notes to clinic:  Expired Rx, unable to refill     Requested Prescriptions  Pending Prescriptions Disp Refills   estradiol (ESTRACE) 1 MG tablet 90 tablet 3    Sig: Take 1 tablet (1 mg total) by mouth daily.     OB/GYN:  Estrogens Failed - 04/08/2019  4:25 PM      Failed - Last BP in normal range    BP Readings from Last 1 Encounters:  03/14/19 (!) 155/78         Passed - Mammogram is up-to-date per Health Maintenance      Passed - Valid encounter within last 12 months    Recent Outpatient Visits          3 weeks ago Recurrent major depressive disorder, in partial remission [facility])   [facility] [place], [place]   1 month ago Essential hypertension   [place], [place], [place]   6 months ago Essential hypertension   [place], Jeannette How, [place]   10 months ago Acute maxillary sinusitis, recurrence not specified   [facility] [place], [place]   1 year ago Left sided sciatica   [facility] Merrie Roof [place], [place]

## 2019-04-12 ENCOUNTER — Other Ambulatory Visit: Payer: Self-pay | Admitting: Family Medicine

## 2019-04-12 NOTE — Telephone Encounter (Signed)
Routing to provider  

## 2019-04-12 NOTE — Telephone Encounter (Signed)
Requested medication (s) are due for refill today: yes  Requested medication (s) are on the active medication list: yes  Last refill:  03/15/2019  Future visit scheduled: no  Notes to clinic: review for refill   Requested Prescriptions  Pending Prescriptions Disp Refills   estradiol (ESTRACE) 1 MG tablet [Pharmacy Med Name: ESTRADIOL 1 MG TAB] 30 tablet     Sig: TAKE 1 TABLET BY MOUTH ONCE DAILY     OB/GYN:  Estrogens Failed - 04/12/2019  1:56 PM      Failed - Last BP in normal range    BP Readings from Last 1 Encounters:  03/14/19 (!) 155/78         Passed - Mammogram is up-to-date per Health Maintenance      Passed - Valid encounter within last 12 months    Recent Outpatient Visits          4 weeks ago Recurrent major depressive disorder, in partial remission [facility])   [place], Lilia [place], [place]   2 months ago Essential hypertension   [place], [place], [place]   6 months ago Essential hypertension   [place], Jeannette How, [place]   10 months ago Acute maxillary sinusitis, recurrence not specified   [facility] [place], [place]   1 year ago Left sided sciatica   [facility] Merrie Roof [place], [place]

## 2019-04-15 ENCOUNTER — Telehealth: Payer: Self-pay | Admitting: Family Medicine

## 2019-04-15 NOTE — Telephone Encounter (Signed)
Is this something that you will refill?

## 2019-04-15 NOTE — Telephone Encounter (Signed)
Lea at pharmacy called for pt to request  estradiol (ESTRACE) 1 MG tablet  Pt not seeing the obgyn anymore, and would like to know if Apolonio Schneiders will fill for her.   [place], [place] (Phone) [phone] (Fax)

## 2019-04-15 NOTE — Telephone Encounter (Signed)
Called pt, she scheduled appt for December and said she will wait to discuss medication at appt.

## 2019-04-15 NOTE — Telephone Encounter (Signed)
Looks like the last note I see from GYN was 01/2018. She can either discuss this with [place] at her upcoming appt she is due for in Dec or make a sooner appt to discuss

## 2019-04-15 NOTE — Telephone Encounter (Signed)
Please disregard, Dr.Johnson denied it.

## 2019-06-17 ENCOUNTER — Other Ambulatory Visit: Payer: Self-pay

## 2019-06-17 ENCOUNTER — Encounter: Payer: Self-pay | Admitting: Family Medicine

## 2019-06-17 ENCOUNTER — Other Ambulatory Visit: Payer: Self-pay | Admitting: Family Medicine

## 2019-06-17 ENCOUNTER — Ambulatory Visit: Payer: PRIVATE HEALTH INSURANCE | Admitting: Family Medicine

## 2019-06-17 VITALS — BP 130/72 | HR 57 | Temp 98.6°F | Ht 64.0 in | Wt 126.0 lb

## 2019-06-17 DIAGNOSIS — R0781 Pleurodynia: Secondary | ICD-10-CM | POA: Diagnosis not present

## 2019-06-17 DIAGNOSIS — F3341 Major depressive disorder, recurrent, in partial remission: Secondary | ICD-10-CM

## 2019-06-17 DIAGNOSIS — F319 Bipolar disorder, unspecified: Secondary | ICD-10-CM

## 2019-06-17 DIAGNOSIS — Z23 Encounter for immunization: Secondary | ICD-10-CM

## 2019-06-17 MED ORDER — HYDROCHLOROTHIAZIDE 25 MG PO TABS: 25.0000 mg | ORAL_TABLET | Freq: Every day | ORAL | 1 refills | Status: DC

## 2019-06-17 MED ORDER — ESTRADIOL 0.1 MG/GM VA CREA: 0.5000 | TOPICAL_CREAM | VAGINAL | 11 refills | Status: DC

## 2019-06-17 MED ORDER — QUETIAPINE FUMARATE ER 150 MG PO TB24: 150.0000 mg | ORAL_TABLET | Freq: Every day | ORAL | 1 refills | Status: DC

## 2019-06-17 MED ORDER — VENLAFAXINE HCL ER 150 MG PO CP24: 150.0000 mg | ORAL_CAPSULE | Freq: Every day | ORAL | 0 refills | Status: DC

## 2019-06-17 MED ORDER — GABAPENTIN 300 MG PO CAPS: 300.0000 mg | ORAL_CAPSULE | Freq: Two times a day (BID) | ORAL | 5 refills | Status: DC

## 2019-06-17 MED ORDER — AMLODIPINE BESYLATE 5 MG PO TABS: 5.0000 mg | ORAL_TABLET | Freq: Every day | ORAL | 1 refills | Status: DC

## 2019-06-17 MED ORDER — METOPROLOL SUCCINATE ER 50 MG PO TB24: 50.0000 mg | ORAL_TABLET | Freq: Every day | ORAL | 1 refills | Status: DC

## 2019-06-17 MED ORDER — CYCLOBENZAPRINE HCL 10 MG PO TABS: 10.0000 mg | ORAL_TABLET | Freq: Three times a day (TID) | ORAL | 0 refills | Status: DC | PRN

## 2019-06-17 MED ORDER — MONTELUKAST SODIUM 10 MG PO TABS: 10.0000 mg | ORAL_TABLET | Freq: Every day | ORAL | 1 refills | Status: DC

## 2019-06-17 NOTE — Progress Notes (Signed)
BP 130/72   Pulse (!) 57   Temp 98.6 F (37 C) (Oral)   Ht 5\' 4"  (1.626 m)   Wt 126 lb (57.2 kg)   LMP  (LMP Unknown)   SpO2 93%   BMI 21.63 kg/m    Subjective:    Patient ID: Monique Tucker, female    DOB: 1960/07/26, 58 y.o.   MRN: [id number]  HPI: Monique Tucker is a 58 y.o. female  Chief Complaint  Patient presents with  . Depression  . Fall    pt states she fell last Tuesday at home. hit herself on her back, right side.   . Medication Refill    fluoxetine   Patient presenting today for depression f/u.   [place] off a chair she was climbing on about a week ago, hit ribs on right side and having significant soreness in ribs. Taking OTC pain relievers with some mild relief. Denies SOB, fevers, cough.   Has not noticed much benefit with the increase in prozac, still feeling very panicked all the time and COVID has made things much worse. Denies significant mood swings, SI/HI.   Depression screen [facility] 2/9 06/17/2019 03/14/2019 02/08/2019  Decreased Interest 3 2 1   Down, Depressed, Hopeless 2 0 1  PHQ - 2 Score 5 2 2   Altered sleeping 1 1 1   Tired, decreased energy 1 0 1  Change in appetite 2 0 0  Feeling bad or failure about yourself  0 0 0  Trouble concentrating 3 0 3  Moving slowly or fidgety/restless 2 0 1  Suicidal thoughts 0 0 0  PHQ-9 Score 14 3 8   Difficult doing work/chores - Somewhat difficult Very difficult   GAD 7 : Generalized Anxiety Score 03/14/2019 02/08/2019 10/03/2018  Nervous, Anxious, on Edge 3 3 0  Control/stop worrying 3 3 0  Worry too much - different things 3 2 0  Trouble relaxing 2 2 0  Restless 3 0 0  Easily annoyed or irritable 3 3 3   Afraid - awful might happen 1 1 0  Total GAD 7 Score 18 14 3   Anxiety Difficulty Somewhat difficult Very difficult Not difficult at all     Relevant past medical, surgical, family and social history reviewed and updated as indicated. Interim medical history since our last visit reviewed. Allergies and  medications reviewed and updated.  Review of Systems  Per HPI unless specifically indicated above     Objective:    BP 130/72   Pulse (!) 57   Temp 98.6 F (37 C) (Oral)   Ht 5\' 4"  (1.626 m)   Wt 126 lb (57.2 kg)   LMP  (LMP Unknown)   SpO2 93%   BMI 21.63 kg/m   Wt Readings from Last 3 Encounters:  06/17/19 126 lb (57.2 kg)  03/14/19 127 lb (57.6 kg)  02/08/19 127 lb 6.4 oz (57.8 kg)    Physical Exam  Results for orders placed or performed in visit on 02/13/19  HgB A1c  Result Value Ref Range   Hgb A1c MFr Bld 5.6 4.8 - 5.6 %   Est. average glucose Bld gHb Est-mCnc 114 mg/dL      Assessment & Plan:   Problem List Items Addressed This Visit      Other   Bipolar 1 disorder ([place]) - Primary    Stable on seroquel, trial effexor to hopefully improve anxiety control      Depression    Does not feel the prozac is helping  anymore. Trial effexor, continue seroquel.       Relevant Medications   venlafaxine XR (EFFEXOR XR) 150 MG 24 hr capsule    Other Visit Diagnoses    Flu vaccine need       Relevant Orders   Flu Vaccine QUAD 6+ mos PF IM (Fluarix Quad PF) (Completed)   Rib pain on right side       Exam without evidence of fracture or lung injury. Pt declines x-ray. Supportive care and OTC pain relievers reviewed       Follow up plan: Return in about 4 weeks (around 07/15/2019) for anxiety f/u.

## 2019-06-27 NOTE — Assessment & Plan Note (Signed)
Does not feel the prozac is helping anymore. Trial effexor, continue seroquel.

## 2019-06-27 NOTE — Assessment & Plan Note (Signed)
Stable on seroquel, trial effexor to hopefully improve anxiety control

## 2019-07-17 ENCOUNTER — Ambulatory Visit (INDEPENDENT_AMBULATORY_CARE_PROVIDER_SITE_OTHER): Payer: PRIVATE HEALTH INSURANCE | Admitting: Family Medicine

## 2019-07-17 ENCOUNTER — Encounter: Payer: Self-pay | Admitting: Family Medicine

## 2019-07-17 VITALS — BP 178/86 | HR 69 | Temp 96.4°F | Wt 130.0 lb

## 2019-07-17 DIAGNOSIS — F419 Anxiety disorder, unspecified: Secondary | ICD-10-CM

## 2019-07-17 DIAGNOSIS — F319 Bipolar disorder, unspecified: Secondary | ICD-10-CM | POA: Diagnosis not present

## 2019-07-17 MED ORDER — ALPRAZOLAM 0.25 MG PO TABS: 0.2500 mg | ORAL_TABLET | Freq: Every day | ORAL | 0 refills | Status: DC | PRN

## 2019-07-17 NOTE — Progress Notes (Signed)
BP (!) 178/86   Pulse 69   Temp (!) 96.4 F (35.8 C) (Oral)   Wt 130 lb (59 kg)   LMP  (LMP Unknown)   BMI 22.31 kg/m    Subjective:    Patient ID: Monique Tucker, female    DOB: May 26, 1961, 59 y.o.   MRN: [id number]  HPI: Monique Tucker is a 59 y.o. female  Chief Complaint  Patient presents with  . Depression  . Anxiety    . This visit was completed via WebEx due to the restrictions of the COVID-19 pandemic. All issues as above were discussed and addressed. Physical exam was done as above through visual confirmation on WebEx. If it was felt that the patient should be evaluated in the office, they were directed there. The patient verbally consented to this visit. . Location of the patient: home . Location of the provider: work . Those involved with this call:  . Provider: Merrie Roof, PA-C . CMA: Lesle Chris, [place] . Front Desk/Registration: Jill Side  . Time spent on call: 15 minutes with patient face to face via video conference. More than 50% of this time was spent in counseling and coordination of care. 5 minutes total spent in review of patient's record and preparation of their chart. I verified patient identity using two factors (patient name and date of birth). Patient consents verbally to being seen via telemedicine visit today.   Presenting today for 1 month mood and anxiety f/u. Feeling some better on the effexor and seroquel combination but still having significant irritability and anxiety, almost exclusively related to her work and the pandemic. Gets overwhelmed sometimes at work and can snap at customers, very anxious at times as well. Denies SI/HI, sleep issues, appetite concerns.   Depression screen [facility] 2/9 07/17/2019 06/17/2019 03/14/2019  Decreased Interest 2 3 2   Down, Depressed, Hopeless 1 2 0  PHQ - 2 Score 3 5 2   Altered sleeping 1 1 1   Tired, decreased energy 0 1 0  Change in appetite 3 2 0  Feeling bad or failure about yourself  0 0 0  Trouble  concentrating 1 3 0  Moving slowly or fidgety/restless 0 2 0  Suicidal thoughts 0 0 0  PHQ-9 Score 8 14 3   Difficult doing work/chores - - Somewhat difficult   GAD 7 : Generalized Anxiety Score 07/17/2019 03/14/2019 02/08/2019 10/03/2018  Nervous, Anxious, on Edge 0 3 3 0  Control/stop worrying 3 3 3  0  Worry too much - different things 3 3 2  0  Trouble relaxing 2 2 2  0  Restless 0 3 0 0  Easily annoyed or irritable 1 3 3 3   Afraid - awful might happen 0 1 1 0  Total GAD 7 Score 9 18 14 3   Anxiety Difficulty - Somewhat difficult Very difficult Not difficult at all   Relevant past medical, surgical, family and social history reviewed and updated as indicated. Interim medical history since our last visit reviewed. Allergies and medications reviewed and updated.  Review of Systems  Per HPI unless specifically indicated above     Objective:    BP (!) 178/86   Pulse 69   Temp (!) 96.4 F (35.8 C) (Oral)   Wt 130 lb (59 kg)   LMP  (LMP Unknown)   BMI 22.31 kg/m   Wt Readings from Last 3 Encounters:  07/17/19 130 lb (59 kg)  06/17/19 126 lb (57.2 kg)  03/14/19 127 lb (57.6 kg)  Physical Exam Vitals and nursing note reviewed.  Constitutional:      General: She is not in acute distress.    Appearance: Normal appearance.  HENT:     Head: Atraumatic.     Right Ear: External ear normal.     Left Ear: External ear normal.     Nose: Nose normal. No congestion.     Mouth/Throat:     Mouth: Mucous membranes are moist.     Pharynx: Oropharynx is clear. No posterior oropharyngeal erythema.  Eyes:     Extraocular Movements: Extraocular movements intact.     Conjunctiva/sclera: Conjunctivae normal.  Cardiovascular:     Comments: Unable to assess via virtual visit Pulmonary:     Effort: Pulmonary effort is normal. No respiratory distress.  Musculoskeletal:        General: Normal range of motion.     Cervical back: Normal range of motion.  Skin:    General: Skin is dry.      Findings: No erythema.  Neurological:     Mental Status: She is alert and oriented to person, place, and time.  Psychiatric:        Mood and Affect: Mood normal.        Thought Content: Thought content normal.        Judgment: Judgment normal.     Results for orders placed or performed in visit on 02/13/19  HgB A1c  Result Value Ref Range   Hgb A1c MFr Bld 5.6 4.8 - 5.6 %   Est. average glucose Bld gHb Est-mCnc 114 mg/dL      Assessment & Plan:   Problem List Items Addressed This Visit      Other   Bipolar 1 disorder ([place]) - Primary    Moods fairly stable, and cannot tolerate higher doses of seroquel due to sedation. Continue current regimen      Anxiety    Situational, exacerbated by her job frustrations and pandemic. Will start rare prn use of xanax, which she states she's tolerated well in the past long ago. 1 script should last at least 2-3 months. Work on breathing exercises, thought redirection to help control sxs additionally      Relevant Medications   ALPRAZolam (XANAX) 0.25 MG tablet       Follow up plan: Return in about 3 months (around 10/15/2019) for Anxiety.

## 2019-07-19 ENCOUNTER — Other Ambulatory Visit: Payer: Self-pay | Admitting: Family Medicine

## 2019-07-19 DIAGNOSIS — F419 Anxiety disorder, unspecified: Secondary | ICD-10-CM | POA: Insufficient documentation

## 2019-07-19 NOTE — Assessment & Plan Note (Signed)
Situational, exacerbated by her job frustrations and pandemic. Will start rare prn use of xanax, which she states she's tolerated well in the past long ago. 1 script should last at least 2-3 months. Work on breathing exercises, thought redirection to help control sxs additionally

## 2019-07-19 NOTE — Assessment & Plan Note (Signed)
Moods fairly stable, and cannot tolerate higher doses of seroquel due to sedation. Continue current regimen

## 2019-08-28 ENCOUNTER — Encounter: Payer: Self-pay | Admitting: Family Medicine

## 2019-08-28 ENCOUNTER — Ambulatory Visit (INDEPENDENT_AMBULATORY_CARE_PROVIDER_SITE_OTHER): Payer: PRIVATE HEALTH INSURANCE | Admitting: Family Medicine

## 2019-08-28 VITALS — Temp 97.7°F | Ht 64.0 in | Wt 130.0 lb

## 2019-08-28 DIAGNOSIS — F319 Bipolar disorder, unspecified: Secondary | ICD-10-CM

## 2019-08-28 DIAGNOSIS — F17200 Nicotine dependence, unspecified, uncomplicated: Secondary | ICD-10-CM

## 2019-08-28 DIAGNOSIS — F419 Anxiety disorder, unspecified: Secondary | ICD-10-CM

## 2019-08-28 DIAGNOSIS — J449 Chronic obstructive pulmonary disease, unspecified: Secondary | ICD-10-CM | POA: Diagnosis not present

## 2019-08-28 NOTE — Progress Notes (Signed)
Temp 97.7 F (36.5 C) (Oral)   Ht 5\' 4"  (1.626 m)   Wt 130 lb (59 kg)   LMP  (LMP Unknown)   BMI 22.31 kg/m    Subjective:    Patient ID: Monique Tucker, female    DOB: 1960-12-09, 59 y.o.   MRN: [id number]  HPI: Monique Tucker is a 59 y.o. female  Chief Complaint  Patient presents with  . Anxiety    . This visit was completed via telephone due to the restrictions of the COVID-19 pandemic. All issues as above were discussed and addressed. Physical exam was done as above through visual confirmation on telephone. If it was felt that the patient should be evaluated in the office, they were directed there. The patient verbally consented to this visit. . Location of the patient: home . Location of the provider: work . Those involved with this call:  . Provider: Merrie Roof, PA-C . CMA: Lesle Chris, [place] . Front Desk/Registration: Jill Side  . Time spent on call: 25 minutes with patient face to face via video conference. More than 50% of this time was spent in counseling and coordination of care. 5 minutes total spent in review of patient's record and preparation of their chart. I verified patient identity using two factors (patient name and date of birth). Patient consents verbally to being seen via telemedicine visit today.   Has cut back by half on cigarettes, trying to taper back slowly. Not using anything for assistance at this time.   Had to stop inhalers due to cost - was on symbicort and spiriva which were working well for her COPD. Some wheezing since being off.   Anxiety, bipolar disorder - has 10.5 xanax left after 1 month. On effexor and seroquel daily. Feeling under better control. Denies side effects, SI/HI. Feeling much more calm and controlled. Able typically to cut the xanax in half when having to take any. Work and pandemic are majority of her stress.   Depression screen [facility] 2/9 08/28/2019 07/17/2019 06/17/2019  Decreased Interest 2 2 3   Down, Depressed,  Hopeless 1 1 2   PHQ - 2 Score 3 3 5   Altered sleeping 0 1 1  Tired, decreased energy 1 0 1  Change in appetite 2 3 2   Feeling bad or failure about yourself  0 0 0  Trouble concentrating 3 1 3   Moving slowly or fidgety/restless 0 0 2  Suicidal thoughts 0 0 0  PHQ-9 Score 9 8 14   Difficult doing work/chores Somewhat difficult - -   GAD 7 : Generalized Anxiety Score 08/28/2019 07/17/2019 03/14/2019 02/08/2019  Nervous, Anxious, on Edge 1 0 3 3  Control/stop worrying 3 3 3 3   Worry too much - different things 3 3 3 2   Trouble relaxing 2 2 2 2   Restless 0 0 3 0  Easily annoyed or irritable 3 1 3 3   Afraid - awful might happen 1 0 1 1  Total GAD 7 Score 13 9 18 14   Anxiety Difficulty Somewhat difficult - Somewhat difficult Very difficult   Relevant past medical, surgical, family and social history reviewed and updated as indicated. Interim medical history since our last visit reviewed. Allergies and medications reviewed and updated.  Review of Systems  Per HPI unless specifically indicated above     Objective:    Temp 97.7 F (36.5 C) (Oral)   Ht 5\' 4"  (1.626 m)   Wt 130 lb (59 kg)   LMP  (LMP Unknown)  BMI 22.31 kg/m   Wt Readings from Last 3 Encounters:  08/28/19 130 lb (59 kg)  07/17/19 130 lb (59 kg)  06/17/19 126 lb (57.2 kg)    Physical Exam Vitals and nursing note reviewed.  Constitutional:      General: She is not in acute distress.    Appearance: Normal appearance.  HENT:     Head: Atraumatic.     Right Ear: External ear normal.     Left Ear: External ear normal.     Nose: Nose normal. No congestion.     Mouth/Throat:     Mouth: Mucous membranes are moist.     Pharynx: Oropharynx is clear. No posterior oropharyngeal erythema.  Eyes:     Extraocular Movements: Extraocular movements intact.     Conjunctiva/sclera: Conjunctivae normal.  Cardiovascular:     Comments: Unable to assess via virtual visit Pulmonary:     Effort: Pulmonary effort is normal. No  respiratory distress.  Musculoskeletal:        General: Normal range of motion.     Cervical back: Normal range of motion.  Skin:    General: Skin is dry.     Findings: No erythema.  Neurological:     Mental Status: She is alert and oriented to person, place, and time.  Psychiatric:        Mood and Affect: Mood normal.        Thought Content: Thought content normal.        Judgment: Judgment normal.     Results for orders placed or performed in visit on 02/13/19  HgB A1c  Result Value Ref Range   Hgb A1c MFr Bld 5.6 4.8 - 5.6 %   Est. 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)    Moods stable and under good control, continue current regimen      Tobacco dependence    Successful cutting back on her own, plans to continue trying to taper down on amount      Anxiety    Significant benefit with addition of prn low dose xanax during this stressful season of life. Discussed trying to make each script last at least 3 months. Rare, prn cautious use. Continue to monitor          Follow up plan: Return in about 3 months (around 11/28/2019) for 6 month f/u.

## 2019-09-02 NOTE — Assessment & Plan Note (Signed)
Moods stable and under good control, continue current regimen

## 2019-09-02 NOTE — Assessment & Plan Note (Signed)
Discussed with patient to find out what inhalers would be covered for her and to let me know and I'll send them in

## 2019-09-02 NOTE — Assessment & Plan Note (Signed)
Successful cutting back on her own, plans to continue trying to taper down on amount

## 2019-09-02 NOTE — Assessment & Plan Note (Signed)
Significant benefit with addition of prn low dose xanax during this stressful season of life. Discussed trying to make each script last at least 3 months. Rare, prn cautious use. Continue to monitor

## 2019-09-18 ENCOUNTER — Other Ambulatory Visit: Payer: Self-pay | Admitting: Family Medicine

## 2019-09-18 NOTE — Telephone Encounter (Signed)
LOV 09/24/19

## 2019-09-18 NOTE — Telephone Encounter (Signed)
Requested medication (s) are due for refill today: yes  Requested medication (s) are on the active medication list: yes  Last refill:  07/17/2019   Future visit scheduled: yes  Notes to clinic:  this refill cannot be delegated    Requested Prescriptions  Pending Prescriptions Disp Refills   ALPRAZolam (XANAX) 0.25 MG tablet [Pharmacy Med Name: ALPRAZOLAM 0.25 MG TAB] 30 tablet     Sig: TAKE 1 TABLET BY MOUTH ONCE DAILY AS NEEDED FOR ANXIETY      Not Delegated - Psychiatry:  Anxiolytics/Hypnotics Failed - 09/18/2019 10:09 AM      Failed - This refill cannot be delegated      Failed - Urine Drug Screen completed in last 360 days.      Passed - Valid encounter within last 6 months    Recent Outpatient Visits           3 weeks ago Moderate COPD (chronic obstructive pulmonary disease) [facility])   [facility] [place], [place]   2 months ago Bipolar 1 disorder [facility])   [place], [place], [place]   3 months ago Bipolar 1 disorder [facility])   [place], [place], [place]   6 months ago Recurrent major depressive disorder, in partial remission [facility])   [facility] [place], [place]   7 months ago Essential hypertension   [place], [place], [place]       Future Appointments             In 5 months Orene Desanctis, [place], [place], [place]

## 2019-09-26 ENCOUNTER — Telehealth: Payer: Self-pay

## 2019-09-26 ENCOUNTER — Other Ambulatory Visit: Payer: Self-pay | Admitting: Family Medicine

## 2019-09-26 NOTE — Telephone Encounter (Signed)
PA denied. Reasoning: The requested medication and/or diagnosis are not a covered benefit and excluded from coverage in accordance with the terms and conditions of your plan benefit.  Routing to provider.

## 2019-09-26 NOTE — Telephone Encounter (Signed)
Requested medication (s) are due for refill today: yes  Requested medication (s) are on the active medication list: yes  Last refill:  07/19/19  Future visit scheduled: yes  Notes to clinic:Last BP elevated to 178/86    Requested Prescriptions  Pending Prescriptions Disp Refills   venlafaxine XR (EFFEXOR-XR) 150 MG 24 hr capsule [Pharmacy Med Name: VENLAFAXINE HCL ER 150 MG CAP] 90 capsule 0    Sig: TAKE 1 CAPSULE BY MOUTH ONCE DAILY WITH BREAKFAST.      Psychiatry: Antidepressants - SNRI - desvenlafaxine & venlafaxine Failed - 09/26/2019  1:25 PM      Failed - Last BP in normal range    BP Readings from Last 1 Encounters:  07/17/19 (!) 178/86          Passed - LDL in normal range and within 360 days    LDL Calculated  Date Value Ref Range Status  02/08/2019 90 0 - 99 mg/dL Final          Passed - Total Cholesterol in normal range and within 360 days    Cholesterol, Total  Date Value Ref Range Status  02/08/2019 172 100 - 199 mg/dL Final          Passed - Triglycerides in normal range and within 360 days    Triglycerides  Date Value Ref Range Status  02/08/2019 95 0 - 149 mg/dL Final          Passed - Completed PHQ-2 or PHQ-9 in the last 360 days.      Passed - Valid encounter within last 6 months    Recent Outpatient Visits           4 weeks ago Moderate COPD (chronic obstructive pulmonary disease) [facility]   [facility] [place], [place]   2 months ago Bipolar 1 disorder [facility])   [place], [place], [place]   3 months ago Bipolar 1 disorder [facility])   [place], [place], [place]   6 months ago Recurrent major depressive disorder, in partial remission [facility])   [facility] [place], [place]   7 months ago Essential hypertension   [place], [place], [place]       Future Appointments             In 5 months Orene Desanctis, [place] Argue, [place], [place]

## 2019-09-26 NOTE — Telephone Encounter (Signed)
Prior Authorization initiated via CoverMyMeds for  Budesonide-Formoterol Fumarate 160-4.5MCG/ACT aerosol Key: [id number]

## 2019-09-26 NOTE — Telephone Encounter (Signed)
Routing to provider  

## 2019-09-27 NOTE — Telephone Encounter (Signed)
Her COPD is not a covered dx? Did they offer alternatives?

## 2019-09-30 NOTE — Telephone Encounter (Signed)
No and this medication is also a plan exclusion. They will not cover it.  Printing medication list in that category and giving to provider.

## 2019-10-01 MED ORDER — FLUTICASONE-SALMETEROL 250-50 MCG/DOSE IN AEPB: 1.0000 | INHALATION_SPRAY | Freq: Two times a day (BID) | RESPIRATORY_TRACT | 3 refills | Status: DC

## 2019-10-01 NOTE — Telephone Encounter (Signed)
Patient notified

## 2019-10-01 NOTE — Telephone Encounter (Signed)
Sent in advair instead

## 2019-10-11 ENCOUNTER — Other Ambulatory Visit: Payer: Self-pay | Admitting: Family Medicine

## 2019-10-29 ENCOUNTER — Telehealth: Payer: Self-pay

## 2019-10-29 NOTE — Telephone Encounter (Signed)
Prior Authorization initiated via CoverMyMeds for Effexor Key: [employer]

## 2019-10-30 NOTE — Telephone Encounter (Signed)
PA not required.  From insurance stated below: This medication or product is on your plan's list of covered drugs. Prior authorization is not required at this time.

## 2019-11-22 ENCOUNTER — Other Ambulatory Visit: Payer: Self-pay | Admitting: Family Medicine

## 2019-11-22 NOTE — Telephone Encounter (Signed)
Requested medication (s) are due for refill today: yes  Requested medication (s) are on the active medication list: yes  Last refill:  09/18/19  Future visit scheduled: yes  Notes to clinic:  not delegated    Requested Prescriptions  Pending Prescriptions Disp Refills   ALPRAZolam (XANAX) 0.25 MG tablet [Pharmacy Med Name: ALPRAZOLAM 0.25 MG TAB] 30 tablet     Sig: TAKE 1 TABLET BY MOUTH ONCE DAILY AS NEEDED FOR ANXIETY      Not Delegated - Psychiatry:  Anxiolytics/Hypnotics Failed - 11/22/2019 10:38 AM      Failed - This refill cannot be delegated      Failed - Urine Drug Screen completed in last 360 days.      Passed - Valid encounter within last 6 months    Recent Outpatient Visits           2 months ago Moderate COPD (chronic obstructive pulmonary disease) [facility])   [place], [place]   4 months ago Bipolar 1 disorder [facility])   [place], [place], [place]   5 months ago Bipolar 1 disorder [facility])   [place], [place], [place]   8 months ago Recurrent major depressive disorder, in partial remission [facility])   [place], [place], [place]   9 months ago Essential hypertension   [place], [place], [place]       Future Appointments             In 3 months Orene Desanctis, [place], [place], [place]

## 2019-11-22 NOTE — Telephone Encounter (Signed)
Patient last seen 08/28/19 and has appointment 03/06/20

## 2019-12-02 ENCOUNTER — Other Ambulatory Visit: Payer: Self-pay | Admitting: Family Medicine

## 2020-01-07 ENCOUNTER — Other Ambulatory Visit: Payer: Self-pay | Admitting: Family Medicine

## 2020-01-08 ENCOUNTER — Telehealth: Payer: Self-pay | Admitting: Family Medicine

## 2020-01-08 MED ORDER — EPINEPHRINE 0.3 MG/0.3ML IJ SOAJ: INTRAMUSCULAR | 1 refills | Status: DC

## 2020-01-08 NOTE — Telephone Encounter (Signed)
Pt called and would like to have epi-pen sent in to [place].

## 2020-01-08 NOTE — Telephone Encounter (Signed)
Rx sent 

## 2020-01-18 ENCOUNTER — Other Ambulatory Visit: Payer: Self-pay | Admitting: Family Medicine

## 2020-01-18 NOTE — Telephone Encounter (Signed)
Requested medication (s) are due for refill today: yes  Requested medication (s) are on the active medication list: yes  Last refill:  11/26/19  Future visit scheduled: yes  Notes to clinic:  med not delegated to NT to RF   Requested Prescriptions  Pending Prescriptions Disp Refills   ALPRAZolam (XANAX) 0.25 MG tablet [Pharmacy Med Name: ALPRAZOLAM 0.25 MG TAB] 30 tablet     Sig: TAKE 1 TABLET BY MOUTH ONCE DAILY AS NEEDED FOR ANXIETY      Not Delegated - Psychiatry:  Anxiolytics/Hypnotics Failed - 01/18/2020 10:44 AM      Failed - This refill cannot be delegated      Failed - Urine Drug Screen completed in last 360 days.      Passed - Valid encounter within last 6 months    Recent Outpatient Visits           4 months ago Moderate COPD (chronic obstructive pulmonary disease) [facility])   [place], [place]   6 months ago Bipolar 1 disorder [facility])   [place], [place], [place]   7 months ago Bipolar 1 disorder [facility])   [place], [place], [place]   10 months ago Recurrent major depressive disorder, in partial remission [facility])   [place], [place], [place]   11 months ago Essential hypertension   [place], [place], [place]       Future Appointments             In 1 month Orene Desanctis, [place], [place], [place]

## 2020-01-20 NOTE — Telephone Encounter (Signed)
Called and spoke with patient. She stated that will wait to her upcoming appointment in 03/06/20 to refill the Rx

## 2020-01-20 NOTE — Telephone Encounter (Signed)
Routing to provider  

## 2020-02-04 ENCOUNTER — Other Ambulatory Visit: Payer: Self-pay | Admitting: Family Medicine

## 2020-02-04 NOTE — Telephone Encounter (Signed)
Requested Prescriptions  Pending Prescriptions Disp Refills  . venlafaxine XR (EFFEXOR-XR) 150 MG 24 hr capsule [Pharmacy Med Name: VENLAFAXINE HCL ER 150 MG CAP] 90 capsule 0    Sig: TAKE 1 CAPSULE BY MOUTH ONCE DAILY WITH BREAKFAST.     Psychiatry: Antidepressants - SNRI - desvenlafaxine & venlafaxine Failed - 02/04/2020 11:47 AM      Failed - LDL in normal range and within 360 days    LDL Calculated  Date Value Ref Range Status  02/08/2019 90 0 - 99 mg/dL Final         Failed - Total Cholesterol in normal range and within 360 days    Cholesterol, Total  Date Value Ref Range Status  02/08/2019 172 100 - 199 mg/dL Final         Failed - Triglycerides in normal range and within 360 days    Triglycerides  Date Value Ref Range Status  02/08/2019 95 0 - 149 mg/dL Final         Failed - Last BP in normal range    BP Readings from Last 1 Encounters:  07/17/19 (!) 178/86         Passed - Completed PHQ-2 or PHQ-9 in the last 360 days.      Passed - Valid encounter within last 6 months    Recent Outpatient Visits          5 months ago Moderate COPD (chronic obstructive pulmonary disease) ([place])   [place], [place], [place]   6 months ago Bipolar 1 disorder [facility])   [place], [place], [place]   7 months ago Bipolar 1 disorder [facility])   [place], [place], [place]   10 months ago Recurrent major depressive disorder, in partial remission [facility])   [place], [place], [place]   12 months ago Essential hypertension   [place], [place], [place]      Future Appointments            In 1 month [place], [place], NP [facility], [facility]           . amLODipine ([place]) 5 MG tablet [Pharmacy Med Name: AMLODIPINE BESYLATE 5 MG TAB] 90 tablet 0    Sig: TAKE 1 TABLET BY MOUTH ONCE DAILY     Cardiovascular:  Calcium Channel Blockers Failed -  02/04/2020 11:47 AM      Failed - Last BP in normal range    BP Readings from Last 1 Encounters:  07/17/19 (!) 178/86         Passed - Valid encounter within last 6 months    Recent Outpatient Visits          5 months ago Moderate COPD (chronic obstructive pulmonary disease) ([place])   [place], [place], [place]   6 months ago Bipolar 1 disorder [facility])   [place], [place], [place]   7 months ago Bipolar 1 disorder [facility])   [place], [place], [place]   10 months ago Recurrent major depressive disorder, in partial remission [facility])   [facility] [place] Roof [place], [place]   12 months ago Essential hypertension   [place], Lilia [place], [place]      Future Appointments            In 1 month [place], Burn[place], NP [employer], [facility]

## 2020-02-12 ENCOUNTER — Other Ambulatory Visit: Payer: Self-pay | Admitting: Family Medicine

## 2020-02-12 NOTE — Telephone Encounter (Signed)
LOV: 08/28/19  NOV: 03/06/20

## 2020-02-12 NOTE — Telephone Encounter (Signed)
Requested medication (s) are due for refill today: no  Requested medication (s) are on the active medication list: yes  Last refill:  02/04/2020  Future visit scheduled: no  Notes to clinic:  this refill cannot be delegated    Requested Prescriptions  Pending Prescriptions Disp Refills   QUEtiapine Fumarate (SEROQUEL XR) 150 MG 24 hr tablet [Pharmacy Med Name: QUETIAPINE FUMARATE ER 150 MG TAB] 90 tablet 1    Sig: TAKE 1 TABLET BY MOUTH ONCE DAILY AT BEDTIME      Not Delegated - Psychiatry:  Antipsychotics - Second Generation (A[place]) - quetiapine Failed - 02/12/2020 12:55 PM      Failed - This refill cannot be delegated      Failed - ALT in normal range and within 180 days    ALT  Date Value Ref Range Status  02/08/2019 46 (H) 0 - 32 IU/L Final          Failed - AST in normal range and within 180 days    AST  Date Value Ref Range Status  02/08/2019 53 (H) 0 - 40 IU/L Final          Failed - Last BP in normal range    BP Readings from Last 1 Encounters:  07/17/19 (!) 178/86          Passed - Completed PHQ-2 or PHQ-9 in the last 360 days.      Passed - Valid encounter within last 6 months    Recent Outpatient Visits           5 months ago Moderate COPD (chronic obstructive pulmonary disease) [facility])   [place], [place]   7 months ago Bipolar 1 disorder [facility])   [place], [place], [place]   8 months ago Bipolar 1 disorder [facility])   [place], [place], [place]   11 months ago Recurrent major depressive disorder, in partial remission [facility])   [facility] [place], [place]   1 year ago Essential hypertension   [facility] [place], [place]       Future Appointments             In 3 weeks Eulogio Bear, NP [facility], [facility]

## 2020-03-06 ENCOUNTER — Ambulatory Visit: Payer: PRIVATE HEALTH INSURANCE | Admitting: Nurse Practitioner

## 2020-03-06 ENCOUNTER — Ambulatory Visit: Payer: PRIVATE HEALTH INSURANCE | Admitting: Family Medicine

## 2020-03-06 NOTE — Progress Notes (Deleted)
LMP  (LMP Unknown)    Subjective:    Patient ID: Monique Tucker, female    DOB: Jan 21, 1961, 59 y.o.   MRN: [id number]  HPI: Monique Tucker is a 59 y.o. female presenting for follow up  No chief complaint on file.  MOOD Duration:{Blank single:19197::"controlled","uncontrolled","better","worse","exacerbated","stable"} Anxious mood: {Blank single:19197::"yes","no"}  Excessive worrying: {Blank single:19197::"yes","no"} Irritability: {Blank single:19197::"yes","no"}  Sweating: {Blank single:19197::"yes","no"} Nausea: {Blank single:19197::"yes","no"} Palpitations:{Blank single:19197::"yes","no"} Hyperventilation: {Blank single:19197::"yes","no"} Panic attacks: {Blank single:19197::"yes","no"} Agoraphobia: {Blank single:19197::"yes","no"}  Obscessions/compulsions: {Blank single:19197::"yes","no"} Depressed mood: {Blank single:19197::"yes","no"} Depression screen [facility] 2/9 08/28/2019 07/17/2019 06/17/2019 03/14/2019 02/08/2019  Decreased Interest 2 2 3 2 1   Down, Depressed, Hopeless 1 1 2  0 1  PHQ - 2 Score 3 3 5 2 2   Altered sleeping 0 1 1 1 1   Tired, decreased energy 1 0 1 0 1  Change in appetite 2 3 2  0 0  Feeling bad or failure about yourself  0 0 0 0 0  Trouble concentrating 3 1 3  0 3  Moving slowly or fidgety/restless 0 0 2 0 1  Suicidal thoughts 0 0 0 0 0  PHQ-9 Score 9 8 14 3 8   Difficult doing work/chores Somewhat difficult - - Somewhat difficult Very difficult   Anhedonia: {Blank single:19197::"yes","no"} Weight changes: {Blank single:19197::"yes","no"} Insomnia: {Blank single:19197::"yes","no"} {Blank single:19197::"hard to fall asleep","hard to stay asleep"}  Hypersomnia: {Blank single:19197::"yes","no"} Fatigue/loss of energy: {Blank single:19197::"yes","no"} Feelings of worthlessness: {Blank single:19197::"yes","no"} Feelings of guilt: {Blank single:19197::"yes","no"} Impaired concentration/indecisiveness: {Blank single:19197::"yes","no"} Suicidal ideations: {Blank  single:19197::"yes","no"}  Crying spells: {Blank single:19197::"yes","no"} Recent Stressors/Life Changes: {Blank single:19197::"yes","no"}   Relationship problems: {Blank single:19197::"yes","no"}   Family stress: {Blank single:19197::"yes","no"}     Financial stress: {Blank single:19197::"yes","no"}    Job stress: {Blank single:19197::"yes","no"}    Recent death/loss: {Blank single:19197::"yes","no"}  HYPERTENSION Hypertension status: {Blank single:19197::"controlled","uncontrolled","better","worse","exacerbated","stable"}  Satisfied with current treatment? {Blank single:19197::"yes","no"} Duration of hypertension: {Blank single:19197::"chronic","months","years"} BP monitoring frequency:  {Blank single:19197::"not checking","rarely","daily","weekly","monthly","a few times a day","a few times a week","a few times a month"} BP range:  BP medication side effects:  {Blank single:19197::"yes","no"} Medication compliance: {Blank single:19197::"excellent compliance","good compliance","fair compliance","poor compliance"} Previous BP meds:{Blank DDUKGURK:27062::"BJSE","GBTDVVOHYW","VPXTGGYIRS/WNIOEVOJJK","KXFGHWEX","HBZJIRCVEL","FYBOFBPZWC/HENI","DPOEUMPNTI (bystolic)","carvedilol","chlorthalidone","clonidine","diltiazem","exforge HCT","HCTZ","irbesartan (avapro)","labetalol","lisinopril","lisinopril-HCTZ","losartan (cozaar)","methyldopa","nifedipine","olmesartan (benicar)","olmesartan-HCTZ","quinapril","ramipril","spironalactone","tekturna","valsartan","valsartan-HCTZ","verapamil"} Aspirin: {Blank single:19197::"yes","no"} Recurrent headaches: {Blank single:19197::"yes","no"} Visual changes: {Blank single:19197::"yes","no"} Palpitations: {Blank single:19197::"yes","no"} Dyspnea: {Blank single:19197::"yes","no"} Chest pain: {Blank single:19197::"yes","no"} Lower extremity edema: {Blank single:19197::"yes","no"} Dizzy/lightheaded: {Blank single:19197::"yes","no"}  ALLERGIES Duration: {Blank  single:19197::"chronic","days","weeks","months"} Runny nose: {Blank single:19197::"yes","no"} {Blank single:19197::""clear","green","yellow"} Nasal congestion: {Blank single:19197::"yes","no"} Nasal itching: {Blank single:19197::"yes","no"} Sneezing: {Blank single:19197::"yes","no"} Eye swelling, itching or discharge: {Blank single:19197::"yes","no"} Post nasal drip: {Blank single:19197::"yes","no"} Cough: {Blank single:19197::"yes","no","yes, productive","yes, non-productive"} Sinus pressure: {Blank single:19197::"yes","no"}  Ear pain: {Blank single:19197::"yes","no"} {Blank single:19197::""right","left","bilateral"} Ear pressure: {Blank single:19197::"yes","no"} {Blank single:19197::""right","left","bilateral"} Fever: {Blank single:19197::"yes","no"} {Blank single:19197::"subjective","low grade"} Symptoms occur seasonally: {Blank single:19197::"yes","no"} Symptoms occur perenially: {Blank single:19197::"yes","no"} Satisfied with current treatment: {Blank single:19197::"yes","no"} Allergist evaluation in past: {Blank single:19197::"yes","no"} Allergen injection immunotherapy: {Blank single:19197::"yes","no"} Recurrent sinus infections: {Blank single:19197::"yes","no"} ENT evaluation in past: {Blank single:19197::"yes","no"} Known environmental allergy: {Blank single:19197::"yes","no"} Indoor pets: {Blank single:19197::"yes","no"} History of asthma: {Blank single:19197::"yes","no"} Current allergy medications: {Blank multiple:19196::"none","allegra","allegra D","astelin","astepro","benadryl","claritin","claritin D","flonase","nasacort","nasonex","rhinocort","singulair","veramyst","zyrtec","zyrtec D","xyzal"} Treatments attempted: {Blank multiple:19196::"none","no previous nasal corticosteroids","allegra","allegra D","astelin","astepro","benadryl","claritin","claritin D","flonase","nasacort","nasonex","rhinocort","singulair","veramyst","zyrtec","zyrtec D","xyzal"}  COPD COPD status: {Blank  single:19197::"controlled","uncontrolled","better","worse","exacerbated","stable"} Satisfied with current treatment?: {Blank single:19197::"yes","no"} Oxygen use: {Blank single:19197::"yes","no"} Dyspnea frequency:  Cough frequency:  Rescue inhaler frequency:   Limitation of activity: {Blank single:19197::"yes","no"} Productive cough:  Last Spirometry:  Pneumovax: {Blank single:19197::"Up to Date","Not up to Date","unknown"} Influenza: {Blank single:19197::"Up to Date","Not up to Date","unknown"}    Relevant past medical, surgical, family and social history reviewed and updated  as indicated. Interim medical history since our last visit reviewed. Allergies and medications reviewed and updated.  Review of Systems  Per HPI unless specifically indicated above     Objective:    LMP  (LMP Unknown)   Wt Readings from Last 3 Encounters:  08/28/19 130 lb (59 kg)  07/17/19 130 lb (59 kg)  06/17/19 126 lb (57.2 kg)    Physical Exam  Results for orders placed or performed in visit on 02/13/19  HgB A1c  Result Value Ref Range   Hgb A1c MFr Bld 5.6 4.8 - 5.6 %   Est. average glucose Bld gHb Est-mCnc 114 mg/dL      Assessment & Plan:   Problem List Items Addressed This Visit    None       Follow up plan: No follow-ups on file.

## 2020-03-11 ENCOUNTER — Other Ambulatory Visit: Payer: Self-pay | Admitting: Family Medicine

## 2020-03-11 NOTE — Telephone Encounter (Signed)
FYI Pt scheduled for apt on 03/23/2020

## 2020-03-11 NOTE — Telephone Encounter (Signed)
Lvm to make this apt. 

## 2020-03-11 NOTE — Telephone Encounter (Signed)
Previous RL patient.  °

## 2020-03-23 ENCOUNTER — Ambulatory Visit: Payer: PRIVATE HEALTH INSURANCE | Admitting: Nurse Practitioner

## 2020-03-23 NOTE — Progress Notes (Deleted)
Established patient visit   Patient: Monique Tucker   DOB: 1960-07-28   59 y.o. Female  MRN: [id number] Visit Date: 03/23/2020  Today's healthcare provider: Eulogio Bear, NP   No chief complaint on file.  Subjective    HPI  Hypertension, follow-up  BP Readings from Last 3 Encounters:  07/17/19 (!) 178/86  06/17/19 130/72  03/14/19 (!) 155/78   Wt Readings from Last 3 Encounters:  08/28/19 130 lb (59 kg)  07/17/19 130 lb (59 kg)  06/17/19 126 lb (57.2 kg)     She was last seen for hypertension 1 years ago.  BP at that visit was ***. Management since that visit includes no changes.  She reports {excellent/good/fair/poor:19665} compliance with treatment. She {is/is not:9024} having side effects. {document side effects if present:1} She is following a {diet:21022986} diet. She {is/is not:9024} exercising. She {does/does not:200015} smoke.  Use of agents associated with hypertension: {bp agents assoc with hypertension:511::"none"}.   Outside blood pressures are {***enter patient reported home BP readings, or 'not being checked':1}. Symptoms: {Yes/No:20286} chest pain {Yes/No:20286} chest pressure  {Yes/No:20286} palpitations {Yes/No:20286} syncope  {Yes/No:20286} dyspnea {Yes/No:20286} orthopnea  {Yes/No:20286} paroxysmal nocturnal dyspnea {Yes/No:20286} lower extremity edema   Pertinent labs: Lab Results  Component Value Date   CHOL 172 02/08/2019   HDL 63 02/08/2019   LDLCALC 90 02/08/2019   TRIG 95 02/08/2019   Lab Results  Component Value Date   NA 140 02/08/2019   K 4.4 02/08/2019   CREATININE 0.78 02/08/2019   GFRNONAA 84 02/08/2019   GFRAA 97 02/08/2019   GLUCOSE 154 (H) 02/08/2019     The 10-year ASCVD risk score Mikey Bussing DC Jr., et al., 2013) is: 14.1%   --------------------------------------------------------------------------------------------------- Anxiety, Follow-up  She was last seen for anxiety 6 months ago. Changes made at last  visit include continue Xanax as needed.   She reports {excellent/good/fair/poor:19665} compliance with treatment. She reports {good/fair/poor:18685} tolerance of treatment. She {is/is not:21021397} having side effects. {document side effects if present:1}  She feels her anxiety is {Desc; severity:60313} and {improved/worse/unchanged:3041574} since last visit.  Symptoms: {Yes/No:20286} chest pain {Yes/No:20286} difficulty concentrating  {Yes/No:20286} dizziness {Yes/No:20286} fatigue  {Yes/No:20286} feelings of losing control {Yes/No:20286} insomnia  {Yes/No:20286} irritable {Yes/No:20286} palpitations  {Yes/No:20286} panic attacks {Yes/No:20286} racing thoughts  {Yes/No:20286} shortness of breath {Yes/No:20286} sweating  {Yes/No:20286} tremors/shakes    GAD-7 Results GAD-7 Generalized Anxiety Disorder Screening Tool 08/28/2019 07/17/2019 03/14/2019  1. Feeling Nervous, Anxious, or on Edge 1 0 3  2. Not Being Able to Stop or Control Worrying 3 3 3   3. Worrying Too Much About Different Things 3 3 3   4. Trouble Relaxing 2 2 2   5. Being So Restless it's Hard To Sit Still 0 0 3  6. Becoming Easily Annoyed or Irritable 3 1 3   7. Feeling Afraid As If Something Awful Might Happen 1 0 1  Total GAD-7 Score 13 9 18   Difficulty At Work, Home, or Getting  Along With Others? Somewhat difficult - Somewhat difficult    PHQ-9 Scores PHQ9 SCORE ONLY 08/28/2019 07/17/2019 06/17/2019  PHQ-9 Total Score 9 8 14     --------------------------------------------------------------------------------------------------- Depression, Follow-up  She  was last seen for this 9 months ago. Changes made at last visit include trial of effexor and continue seroquel.   She reports {excellent/good/fair/poor:19665} compliance with treatment. She {is/is not:21021397} having side effects. ***  She reports {DESC; GOOD/FAIR/POOR:18685} tolerance of treatment. Current symptoms include: {Symptoms; depression:1002} She feels  she is {improved/worse/unchanged:3041574} since last visit.  Depression screen [facility] 2/9 08/28/2019 07/17/2019 06/17/2019  Decreased Interest 2 2 3   Down, Depressed, Hopeless 1 1 2   PHQ - 2 Score 3 3 5   Altered sleeping 0 1 1  Tired, decreased energy 1 0 1  Change in appetite 2 3 2   Feeling bad or failure about yourself  0 0 0  Trouble concentrating 3 1 3   Moving slowly or fidgety/restless 0 0 2  Suicidal thoughts 0 0 0  PHQ-9 Score 9 8 14   Difficult doing work/chores Somewhat difficult - -    -----------------------------------------------------------------------------------------   Patient Active Problem List   Diagnosis Date Noted  . Anxiety   . DDD (degenerative disc disease), lumbar 03/20/2018  . Chronic hip pain 02/23/2017  . AR (allergic rhinitis) 12/22/2016  . Macrocytosis without anemia 12/21/2015  . Plantar fasciitis 12/18/2015  . Pain in limb 12/18/2015  . Pain in both wrists 12/18/2015  . Grief reaction 11/03/2015  . S/P vaginal hysterectomy 10/20/2015  . SUI (stress urinary incontinence, female) 08/12/2015  . Vaginal atrophy 06/30/2015  . Menopause 06/30/2015  . Leg cramps 03/31/2015  . Moderate COPD (chronic obstructive pulmonary disease) ([place]) 12/26/2014  . Tobacco dependence 12/26/2014  . Bipolar 1 disorder ([place]) 12/25/2014  . Depression 12/25/2014  . Acute hepatitis C 12/25/2014  . Hypertension 12/25/2014  . Acute exacerbation of chronic bronchitis ([place]) 12/25/2014   Social History   Tobacco Use  . Smoking status: Current Every Day Smoker    Packs/day: 0.25    Types: Cigarettes  . Smokeless tobacco: Never Used  . Tobacco comment: patient has been Rx nicotine patch  Vaping Use  . Vaping Use: Former  Substance Use Topics  . Alcohol use: Yes    Comment: occassional  . Drug use: No   Allergies  Allergen Reactions  . Bee Venom Anaphylaxis  . Seasonal Ic [Cholestatin] Other (See Comments)    Runny nose and itching eyes SEASONAL ALLEGIES  .  Lisinopril Anxiety       Medications: Outpatient Medications Prior to Visit  Medication Sig  . Acetaminophen (TYLENOL 8 HOUR PO) Take by mouth daily.  Marland Kitchen ALPRAZolam (XANAX) 0.25 MG tablet TAKE 1 TABLET BY MOUTH ONCE DAILY AS NEEDED FOR ANXIETY  . amLODipine (NORVASC) 5 MG tablet TAKE 1 TABLET BY MOUTH ONCE DAILY  . budesonide-formoterol (SYMBICORT) 160-4.5 MCG/ACT inhaler Inhale 2 puffs into the lungs 2 (two) times daily. (Patient not taking: Reported on 08/28/2019)  . cetirizine (ZYRTEC) 10 MG tablet TAKE 1 TABLET BY MOUTH ONCE DAILY  . EPINEPHrine 0.3 mg/0.3 mL IJ SOAJ injection INJECT 0.3 MLS INTO THE MUSCLE ONCE  . estradiol (ESTRACE) 0.1 MG/GM vaginal cream Place 0.5 Applicatorfuls vaginally 2 (two) times a week.  . fluticasone (FLONASE) 50 MCG/ACT nasal spray Place 2 sprays into both nostrils daily. (Patient not taking: Reported on 08/28/2019)  . Fluticasone-Salmeterol (ADVAIR DISKUS) 250-50 MCG/DOSE AEPB Inhale 1 puff into the lungs 2 (two) times daily.  Marland Kitchen gabapentin (NEURONTIN) 300 MG capsule Take 1 capsule (300 mg total) by mouth 2 (two) times daily.  . hydrochlorothiazide (HYDRODIURIL) 25 MG tablet Take 1 tablet (25 mg total) by mouth daily.  . IBUPROFEN PO Take by mouth daily.  . magnesium gluconate (MAGONATE) 500 MG tablet Take 500 mg by mouth at bedtime.  . metoprolol succinate (TOPROL-XL) 50 MG 24 hr tablet TAKE 1 TABLET BY MOUTH DAILY WITH OR IMMEDIATELY FOLLOWING A MEAL  . montelukast (SINGULAIR) 10 MG tablet TAKE ONE TABLET BY MOUTH AT BEDTIME  .  QUEtiapine Fumarate (SEROQUEL XR) 150 MG 24 hr tablet TAKE 1 TABLET BY MOUTH ONCE DAILY AT BEDTIME  . SPIRIVA RESPIMAT 2.5 MCG/ACT AERS INHALE 1 PUFF BY MOUTH INTO THE LUNGS DAILY  . venlafaxine XR (EFFEXOR-XR) 150 MG 24 hr capsule TAKE 1 CAPSULE BY MOUTH ONCE DAILY WITH BREAKFAST.   Facility-Administered Medications Prior to Visit  Medication Dose Route Frequency Provider  . betamethasone acetate-betamethasone sodium phosphate  (CELESTONE) injection 3 mg  3 mg Intramuscular Once Daylene Katayama M, DPM  . betamethasone acetate-betamethasone sodium phosphate (CELESTONE) injection 3 mg  3 mg Intramuscular Once Edrick Kins, DPM    Review of Systems  {Heme  Chem  Endocrine  Serology  Results Review (optional):23779::" "}  Objective    LMP  (LMP Unknown)  BP Readings from Last 3 Encounters:  07/17/19 (!) 178/86  06/17/19 130/72  03/14/19 (!) 155/78   Wt Readings from Last 3 Encounters:  08/28/19 130 lb (59 kg)  07/17/19 130 lb (59 kg)  06/17/19 126 lb (57.2 kg)      Physical Exam  ***  No results found for any visits on 03/23/20.  Assessment & Plan     ***  No follow-ups on file.      {provider attestation***:1}   Eulogio Bear, NP  [facility] [phone] (phone) [phone] (fax)  [place]

## 2020-04-14 ENCOUNTER — Other Ambulatory Visit: Payer: Self-pay

## 2020-04-14 MED ORDER — HYDROCHLOROTHIAZIDE 25 MG PO TABS
25.0000 mg | ORAL_TABLET | Freq: Every day | ORAL | 1 refills | Status: DC
Start: 2020-04-14 — End: 2021-04-20

## 2020-04-14 MED ORDER — METOPROLOL SUCCINATE ER 50 MG PO TB24
ORAL_TABLET | ORAL | 0 refills | Status: DC
Start: 2020-04-14 — End: 2020-08-07

## 2020-04-14 MED ORDER — GABAPENTIN 300 MG PO CAPS
300.0000 mg | ORAL_CAPSULE | Freq: Two times a day (BID) | ORAL | 5 refills | Status: DC
Start: 2020-04-14 — End: 2020-12-18

## 2020-04-14 MED ORDER — MONTELUKAST SODIUM 10 MG PO TABS
10.0000 mg | ORAL_TABLET | Freq: Every day | ORAL | 0 refills | Status: DC
Start: 2020-04-14 — End: 2020-08-07

## 2020-04-14 NOTE — Telephone Encounter (Signed)
Pt stated she needs a refill on all medication has apt on 04/21/2020.Pt stated she thought her apt was this Tuesday 04/14/2020.

## 2020-04-14 NOTE — Telephone Encounter (Signed)
Previous RL patient. Only marking medications that are due, some are not out according to chart.

## 2020-04-21 ENCOUNTER — Encounter: Payer: Self-pay | Admitting: Nurse Practitioner

## 2020-04-21 ENCOUNTER — Ambulatory Visit: Payer: PRIVATE HEALTH INSURANCE | Admitting: Nurse Practitioner

## 2020-04-21 ENCOUNTER — Other Ambulatory Visit: Payer: Self-pay

## 2020-04-21 VITALS — BP 137/73 | HR 69 | Temp 98.1°F | Wt 135.2 lb

## 2020-04-21 DIAGNOSIS — F319 Bipolar disorder, unspecified: Secondary | ICD-10-CM | POA: Diagnosis not present

## 2020-04-21 DIAGNOSIS — Z13220 Encounter for screening for lipoid disorders: Secondary | ICD-10-CM

## 2020-04-21 DIAGNOSIS — F3341 Major depressive disorder, recurrent, in partial remission: Secondary | ICD-10-CM

## 2020-04-21 DIAGNOSIS — M25551 Pain in right hip: Secondary | ICD-10-CM | POA: Diagnosis not present

## 2020-04-21 DIAGNOSIS — Z136 Encounter for screening for cardiovascular disorders: Secondary | ICD-10-CM

## 2020-04-21 DIAGNOSIS — Z23 Encounter for immunization: Secondary | ICD-10-CM | POA: Diagnosis not present

## 2020-04-21 DIAGNOSIS — J449 Chronic obstructive pulmonary disease, unspecified: Secondary | ICD-10-CM | POA: Diagnosis not present

## 2020-04-21 DIAGNOSIS — I10 Essential (primary) hypertension: Secondary | ICD-10-CM

## 2020-04-21 DIAGNOSIS — Z114 Encounter for screening for human immunodeficiency virus [HIV]: Secondary | ICD-10-CM

## 2020-04-21 DIAGNOSIS — D7589 Other specified diseases of blood and blood-forming organs: Secondary | ICD-10-CM

## 2020-04-21 DIAGNOSIS — R7303 Prediabetes: Secondary | ICD-10-CM

## 2020-04-21 MED ORDER — DICLOFENAC SODIUM 1 % EX GEL: 2.0000 g | Freq: Four times a day (QID) | CUTANEOUS | 0 refills | Status: DC

## 2020-04-21 MED ORDER — QUETIAPINE FUMARATE ER 150 MG PO TB24: 150.0000 mg | ORAL_TABLET | Freq: Every day | ORAL | 0 refills | Status: DC

## 2020-04-21 MED ORDER — VENLAFAXINE HCL ER 225 MG PO TB24: 225.0000 mg | ORAL_TABLET | Freq: Every day | ORAL | 0 refills | Status: DC

## 2020-04-21 MED ORDER — AMLODIPINE BESYLATE 5 MG PO TABS: 5.0000 mg | ORAL_TABLET | Freq: Every day | ORAL | 1 refills | Status: DC

## 2020-04-21 MED ORDER — ALPRAZOLAM 0.25 MG PO TABS: 0.2500 mg | ORAL_TABLET | Freq: Every evening | ORAL | 0 refills | Status: DC | PRN

## 2020-04-21 MED ORDER — AMLODIPINE BESYLATE 2.5 MG PO TABS: 2.5000 mg | ORAL_TABLET | Freq: Every day | ORAL | 0 refills | Status: DC

## 2020-04-21 NOTE — Progress Notes (Signed)
BP 137/73    Pulse 69    Temp 98.1 F (36.7 C) (Oral)    Wt 135 lb 3.2 oz (61.3 kg)    LMP  (LMP Unknown)    SpO2 90%    BMI 23.21 kg/m    Subjective:    Patient ID: Monique Tucker, female    DOB: [birth year], 59 y.o.   MRN: [id number]  HPI: Monique Tucker is a 59 y.o. female presenting for medication refill and with multiple concerns.  Chief Complaint  Patient presents with   COPD    patient states that this is getting worse   Is having cataract surgery in November.    HIP PAIN Has noticed the past few weeks, her hip pain has been getting worse.  She stands up all day at work and notices at the end of a long day, she has to hold onto the counter to cook dinner due to the severity of the pain.  She reports arthritis in other places on her body and is wondering if this may also be arthritis. Duration: months Involved hip: right  Mechanism of injury: unknown; thinks maybe due to arthritis Location: lateral  Onset: gradual  Severity: mild to severe  Quality: sharp, shooting Frequency: intermittent Radiation: no Aggravating factors: standing, being busy at work, walking    Alleviating factors: rest, ice   Status: fluctuating Treatments attempted: rest, ice    Relief with NSAIDs?: mild Weakness with weight bearing: yes Weakness with walking: yes Paresthesias / decreased sensation: no Swelling: no Redness:no Fevers: no  HYPERTENSION Currently taking amlodipine 5 mg and hydrochlorothiazide 25 mg for blood pressure. Hypertension status: uncontrolled  Satisfied with current treatment? no Duration of hypertension: chronic BP monitoring frequency:  daily BP range: 130s/70s-80s BP medication side effects:  no Medication compliance: excellent compliance Previous BP meds: see above Aspirin: no Recurrent headaches: no Visual changes: no Palpitations: no Dyspnea: no Chest pain: no Lower extremity edema: no Dizzy/lightheaded: no  DEPRESSION Currently taking Seroquel XR  150 mg and venlafaxine 150 mg.  Thinks that taking an alprazolam daily would control her symptoms well.  Reports feeling very agitated at work and going off on customers. Mood status: uncontrolled Satisfied with current treatment?: no Symptom severity: severe  Duration of current treatment : chronic Side effects: no Medication compliance: excellent compliance Psychotherapy/counseling: no  Previous psychiatric medications: see above Depressed mood: no Anxious mood: yes Anhedonia: no Significant weight loss or gain: no Insomnia: no  Fatigue: yes Feelings of worthlessness or guilt: no Impaired concentration/indecisiveness: no Suicidal ideations: no Hopelessness: no Crying spells: no   COPD Thinks her inhalers are working well for her.  Requesting refills today. COPD status: controlled Satisfied with current treatment?: yes Oxygen use: no Dyspnea frequency: weekly Cough frequency: weekly Rescue inhaler frequency: rarely   Limitation of activity: no Productive cough: no Last Spirometry:  unknown Pneumovax: Up to Date Influenza: Up to Date  Allergies  Allergen Reactions   Bee Venom Anaphylaxis   Seasonal Ic [Cholestatin] Other (See Comments)    Runny nose and itching eyes SEASONAL ALLEGIES   Lisinopril Anxiety   Outpatient Encounter Medications as of 04/21/2020  Medication Sig   ALPRAZolam (XANAX) 0.25 MG tablet Take 1 tablet (0.25 mg total) by mouth at bedtime as needed for anxiety.   amLODipine (NORVASC) 5 MG tablet Take 1 tablet (5 mg total) by mouth daily. To take with 2.5 mg tablet   budesonide-formoterol (SYMBICORT) 160-4.5 MCG/ACT inhaler Inhale 2 puffs into the  lungs 2 (two) times daily.   cetirizine (ZYRTEC) 10 MG tablet TAKE 1 TABLET BY MOUTH ONCE DAILY   EPINEPHrine 0.3 mg/0.3 mL IJ SOAJ injection INJECT 0.3 MLS INTO THE MUSCLE ONCE   estradiol (ESTRACE) 0.1 MG/GM vaginal cream Place 0.5 Applicatorfuls vaginally 2 (two) times a week.   fluticasone  (FLONASE) 50 MCG/ACT nasal spray Place 2 sprays into both nostrils daily.   Fluticasone-Salmeterol (ADVAIR DISKUS) 250-50 MCG/DOSE AEPB Inhale 1 puff into the lungs 2 (two) times daily.   gabapentin (NEURONTIN) 300 MG capsule Take 1 capsule (300 mg total) by mouth 2 (two) times daily.   hydrochlorothiazide (HYDRODIURIL) 25 MG tablet Take 1 tablet (25 mg total) by mouth daily.   IBUPROFEN PO Take by mouth daily.   magnesium gluconate (MAGONATE) 500 MG tablet Take 500 mg by mouth at bedtime.   metoprolol succinate (TOPROL-XL) 50 MG 24 hr tablet TAKE 1 TABLET BY MOUTH DAILY WITH OR IMMEDIATELY FOLLOWING A MEAL   montelukast (SINGULAIR) 10 MG tablet Take 1 tablet (10 mg total) by mouth at bedtime.   QUEtiapine Fumarate (SEROQUEL XR) 150 MG 24 hr tablet Take 1 tablet (150 mg total) by mouth at bedtime.   SPIRIVA RESPIMAT 2.5 MCG/ACT AERS INHALE 1 PUFF BY MOUTH INTO THE LUNGS DAILY   [DISCONTINUED] ALPRAZolam (XANAX) 0.25 MG tablet TAKE 1 TABLET BY MOUTH ONCE DAILY AS NEEDED FOR ANXIETY   [DISCONTINUED] amLODipine (NORVASC) 5 MG tablet TAKE 1 TABLET BY MOUTH ONCE DAILY   [DISCONTINUED] QUEtiapine Fumarate (SEROQUEL XR) 150 MG 24 hr tablet TAKE 1 TABLET BY MOUTH ONCE DAILY AT BEDTIME   [DISCONTINUED] venlafaxine XR (EFFEXOR-XR) 150 MG 24 hr capsule TAKE 1 CAPSULE BY MOUTH ONCE DAILY WITH BREAKFAST.   amLODipine (NORVASC) 2.5 MG tablet Take 1 tablet (2.5 mg total) by mouth daily. To take in conjunction with 5 mg tablets   diclofenac Sodium (VOLTAREN) 1 % GEL Apply 2 g topically 4 (four) times daily.   Venlafaxine HCl 225 MG TB24 Take 1 tablet (225 mg total) by mouth daily.   [DISCONTINUED] Acetaminophen (TYLENOL 8 HOUR PO) Take by mouth daily. (Patient not taking: Reported on 04/21/2020)   [DISCONTINUED] gabapentin (NEURONTIN) 300 MG capsule Take 1 capsule (300 mg total) by mouth 2 (two) times daily.   [DISCONTINUED] hydrochlorothiazide (HYDRODIURIL) 25 MG tablet Take 1 tablet (25 mg  total) by mouth daily.   [DISCONTINUED] metoprolol succinate (TOPROL-XL) 50 MG 24 hr tablet TAKE 1 TABLET BY MOUTH DAILY WITH OR IMMEDIATELY FOLLOWING A MEAL   [DISCONTINUED] montelukast (SINGULAIR) 10 MG tablet TAKE ONE TABLET BY MOUTH AT BEDTIME   Facility-Administered Encounter Medications as of 04/21/2020  Medication   betamethasone acetate-betamethasone sodium phosphate (CELESTONE) injection 3 mg   betamethasone acetate-betamethasone sodium phosphate (CELESTONE) injection 3 mg   Patient Active Problem List   Diagnosis Date Noted   Acute pain of right hip 04/22/2020   Anxiety    DDD (degenerative disc disease), lumbar 03/20/2018   Chronic hip pain 02/23/2017   AR (allergic rhinitis) 12/22/2016   Macrocytosis without anemia 12/21/2015   Plantar fasciitis 12/18/2015   Pain in limb 12/18/2015   Pain in both wrists 12/18/2015   Grief reaction 11/03/2015   S/P vaginal hysterectomy 10/20/2015   SUI (stress urinary incontinence, female) 08/12/2015   Vaginal atrophy 06/30/2015   Menopause 06/30/2015   Leg cramps 03/31/2015   Moderate COPD (chronic obstructive pulmonary disease) ([place]) 12/26/2014   Tobacco dependence 12/26/2014   Bipolar 1 disorder ([place]) 12/25/2014   Depression  12/25/2014   Acute hepatitis C 12/25/2014   Hypertension 12/25/2014   Acute exacerbation of chronic bronchitis ([place]) 12/25/2014   Past Medical History:  Diagnosis Date   Acute hepatitis C    Anxiety    AR (allergic rhinitis)    Arthritis    Bulging lumbar disc    COPD (chronic obstructive pulmonary disease) (HCC)    mild   Depression    Hypertension    Plantar fasciitis    Restless leg syndrome, controlled    Relevant past medical, surgical, family and social history reviewed and updated as indicated. Interim medical history since our last visit reviewed.  Review of Systems  Constitutional: Negative.  Negative for activity change, appetite change and fever.    Eyes: Negative.  Negative for visual disturbance.  Respiratory: Negative.  Negative for cough, shortness of breath and wheezing.   Cardiovascular: Negative.  Negative for chest pain and palpitations.  Musculoskeletal: Positive for arthralgias (right hip pain). Negative for gait problem.  Skin: Negative.  Negative for color change and rash.  Neurological: Negative.  Negative for dizziness, weakness, light-headedness and headaches.  Psychiatric/Behavioral: Positive for agitation and decreased concentration. Negative for confusion, hallucinations, sleep disturbance and suicidal ideas. The patient is nervous/anxious. The patient is not hyperactive.     Per HPI unless specifically indicated above     Objective:    BP 137/73    Pulse 69    Temp 98.1 F (36.7 C) (Oral)    Wt 135 lb 3.2 oz (61.3 kg)    LMP  (LMP Unknown)    SpO2 90%    BMI 23.21 kg/m   Wt Readings from Last 3 Encounters:  04/21/20 135 lb 3.2 oz (61.3 kg)  08/28/19 130 lb (59 kg)  07/17/19 130 lb (59 kg)    Physical Exam Vitals and nursing note reviewed.  Constitutional:      General: She is not in acute distress.    Appearance: Normal appearance. She is not toxic-appearing.  Cardiovascular:     Rate and Rhythm: Normal rate and regular rhythm.     Heart sounds: Normal heart sounds. No murmur heard.   Pulmonary:     Breath sounds: Decreased air movement present. No wheezing, rhonchi or rales.  Skin:    General: Skin is warm and dry.     Capillary Refill: Capillary refill takes less than 2 seconds.     Coloration: Skin is not jaundiced or pale.  Neurological:     Mental Status: She is alert and oriented to person, place, and time.     Gait: Gait normal.  Psychiatric:        Attention and Perception: Attention normal.        Mood and Affect: Mood is anxious. Mood is not depressed. Affect is not flat, tearful or inappropriate.        Speech: Speech is rapid and pressured.        Behavior: Behavior normal.         Thought Content: Thought content normal.        Cognition and Memory: Cognition and memory normal.        Judgment: Judgment normal.       Assessment & Plan:   Problem List Items Addressed This Visit      Cardiovascular and Mediastinum   Hypertension    Chronic, ongoing.  Blood pressure slightly above goal in office today and reports higher blood pressure at home as well.  Likely due to stress, however  this has been ongoing for at least a year.  Will increase amlodipine to 7.5 mg daily today and follow-up in 1 month.  CMP, CBC checked today      Relevant Medications   amLODipine (NORVASC) 5 MG tablet   amLODipine (NORVASC) 2.5 MG tablet   Other Relevant Orders   HgB A1c (Completed)     Respiratory   Moderate COPD (chronic obstructive pulmonary disease) (HCC) - Primary    Chronic, appears to be stable for patient.  Continue current inhalers as prescribed.      Relevant Orders   Comprehensive metabolic panel (Completed)   CBC with Differential/Platelet (Completed)     Other   Bipolar 1 disorder (HCC)    Chronic, ongoing.  Possibly exacerbated by Covid as well as increased stressors at work.  We will continue Seroquel and increase venlafaxine to 225 mg daily.  Patient agreeable to meet with psychiatrist-[facility]  Plan to bridge psychiatric medications until she can get in with psychiatrist.  Xanax 0.25 mg refilled-try to make last as long as possible.  Follow-up in 4 weeks.      Relevant Orders   TSH (Completed)   Comprehensive metabolic panel (Completed)   CBC with Differential/Platelet (Completed)   Ambulatory referral to Psychiatry   Depression    Chronic, ongoing.  PHQ-9 not done today.  Will increase venlafaxine to 225 mg and give refill of Xanax to use very sparingly as needed.  Referral to [facility]ry placed today-[facility].  Plan to bridge patient until she can get in with psychiatrist.  Follow-up in 4 weeks.       Relevant  Medications   ALPRAZolam (XANAX) 0.25 MG tablet   Venlafaxine HCl 225 MG TB24   Other Relevant Orders   TSH (Completed)   Comprehensive metabolic panel (Completed)   CBC with Differential/Platelet (Completed)   HgB A1c (Completed)   Ambulatory referral to Psychiatry   Macrocytosis without anemia    Chronic, stable.  We will recheck CBC today.      Relevant Orders   CBC with Differential/Platelet (Completed)   Acute pain of right hip    Acute, ongoing.  Encourage stretches, and rest as needed.  Will obtain hip x-ray today and start Voltaren gel.  With no improvement, may need referral to orthopedic.      Relevant Orders   DG Hip Unilat W OR W/O Pelvis 2-3 Views Right    Other Visit Diagnoses    Need for influenza vaccination       Relevant Orders   Flu Vaccine QUAD 6+ mos PF IM (Fluarix Quad PF) (Completed)   Screening for HIV (human immunodeficiency virus)       Relevant Orders   HIV Antibody (routine testing w rflx) (Completed)   Encounter for lipid screening for cardiovascular disease       Relevant Orders   Lipid Panel w/o Chol/HDL Ratio (Completed)       Follow up plan: Return in about 4 weeks (around 05/19/2020) for BP and mood follow up.

## 2020-04-21 NOTE — Patient Instructions (Signed)
DASH Eating Plan DASH stands for "Dietary Approaches to Stop Hypertension." The DASH eating plan is a healthy eating plan that has been shown to reduce high blood pressure (hypertension). It may also reduce your risk for type 2 diabetes, heart disease, and stroke. The DASH eating plan may also help with weight loss. What are tips for following this plan?  General guidelines  Avoid eating more than 2,300 mg (milligrams) of salt (sodium) a day. If you have hypertension, you may need to reduce your sodium intake to 1,500 mg a day.  Limit alcohol intake to no more than 1 drink a day for nonpregnant women and 2 drinks a day for men. One drink equals 12 oz of beer, 5 oz of wine, or 1 oz of hard liquor.  Work with your health care provider to maintain a healthy body weight or to lose weight. Ask what an ideal weight is for you.  Get at least 30 minutes of exercise that causes your heart to beat faster (aerobic exercise) most days of the week. Activities may include walking, swimming, or biking.  Work with your health care provider or diet and nutrition specialist (dietitian) to adjust your eating plan to your individual calorie needs. Reading food labels   Check food labels for the amount of sodium per serving. Choose foods with less than 5 percent of the Daily Value of sodium. Generally, foods with less than 300 mg of sodium per serving fit into this eating plan.  To find whole grains, look for the word "whole" as the first word in the ingredient list. Shopping  Buy products labeled as "low-sodium" or "no salt added."  Buy fresh foods. Avoid canned foods and premade or frozen meals. Cooking  Avoid adding salt when cooking. Use salt-free seasonings or herbs instead of table salt or sea salt. Check with your health care provider or pharmacist before using salt substitutes.  Do not fry foods. Cook foods using healthy methods such as baking, boiling, grilling, and broiling instead.  Cook with  heart-healthy oils, such as olive, canola, soybean, or sunflower oil. Meal planning  Eat a balanced diet that includes: ? 5 or more servings of fruits and vegetables each day. At each meal, try to fill half of your plate with fruits and vegetables. ? Up to 6-8 servings of whole grains each day. ? Less than 6 oz of lean meat, poultry, or fish each day. A 3-oz serving of meat is about the same size as a deck of cards. One egg equals 1 oz. ? 2 servings of low-fat dairy each day. ? A serving of nuts, seeds, or beans 5 times each week. ? Heart-healthy fats. Healthy fats called Omega-3 fatty acids are found in foods such as flaxseeds and coldwater fish, like sardines, salmon, and mackerel.  Limit how much you eat of the following: ? Canned or prepackaged foods. ? Food that is high in trans fat, such as fried foods. ? Food that is high in saturated fat, such as fatty meat. ? Sweets, desserts, sugary drinks, and other foods with added sugar. ? Full-fat dairy products.  Do not salt foods before eating.  Try to eat at least 2 vegetarian meals each week.  Eat more home-cooked food and less restaurant, buffet, and fast food.  When eating at a restaurant, ask that your food be prepared with less salt or no salt, if possible. What foods are recommended? The items listed may not be a complete list. Talk with your dietitian about   what dietary choices are best for you. Grains Whole-grain or whole-wheat bread. Whole-grain or whole-wheat pasta. Brown rice. Oatmeal. Quinoa. Bulgur. Whole-grain and low-sodium cereals. Pita bread. Low-fat, low-sodium crackers. Whole-wheat flour tortillas. Vegetables Fresh or frozen vegetables (raw, steamed, roasted, or grilled). Low-sodium or reduced-sodium tomato and vegetable juice. Low-sodium or reduced-sodium tomato sauce and tomato paste. Low-sodium or reduced-sodium canned vegetables. Fruits All fresh, dried, or frozen fruit. Canned fruit in natural juice (without  added sugar). Meat and other protein foods Skinless chicken or turkey. Ground chicken or turkey. Pork with fat trimmed off. Fish and seafood. Egg whites. Dried beans, peas, or lentils. Unsalted nuts, nut butters, and seeds. Unsalted canned beans. Lean cuts of beef with fat trimmed off. Low-sodium, lean deli meat. Dairy Low-fat (1%) or fat-free (skim) milk. Fat-free, low-fat, or reduced-fat cheeses. Nonfat, low-sodium ricotta or cottage cheese. Low-fat or nonfat yogurt. Low-fat, low-sodium cheese. Fats and oils Soft margarine without trans fats. Vegetable oil. Low-fat, reduced-fat, or light mayonnaise and salad dressings (reduced-sodium). Canola, safflower, olive, soybean, and sunflower oils. Avocado. Seasoning and other foods Herbs. Spices. Seasoning mixes without salt. Unsalted popcorn and pretzels. Fat-free sweets. What foods are not recommended? The items listed may not be a complete list. Talk with your dietitian about what dietary choices are best for you. Grains Baked goods made with fat, such as croissants, muffins, or some breads. Dry pasta or rice meal packs. Vegetables Creamed or fried vegetables. Vegetables in a cheese sauce. Regular canned vegetables (not low-sodium or reduced-sodium). Regular canned tomato sauce and paste (not low-sodium or reduced-sodium). Regular tomato and vegetable juice (not low-sodium or reduced-sodium). Pickles. Olives. Fruits Canned fruit in a light or heavy syrup. Fried fruit. Fruit in cream or butter sauce. Meat and other protein foods Fatty cuts of meat. Ribs. Fried meat. Bacon. Sausage. Bologna and other processed lunch meats. Salami. Fatback. Hotdogs. Bratwurst. Salted nuts and seeds. Canned beans with added salt. Canned or smoked fish. Whole eggs or egg yolks. Chicken or turkey with skin. Dairy Whole or 2% milk, cream, and half-and-half. Whole or full-fat cream cheese. Whole-fat or sweetened yogurt. Full-fat cheese. Nondairy creamers. Whipped toppings.  Processed cheese and cheese spreads. Fats and oils Butter. Stick margarine. Lard. Shortening. Ghee. Bacon fat. Tropical oils, such as coconut, palm kernel, or palm oil. Seasoning and other foods Salted popcorn and pretzels. Onion salt, garlic salt, seasoned salt, table salt, and sea salt. Worcestershire sauce. Tartar sauce. Barbecue sauce. Teriyaki sauce. Soy sauce, including reduced-sodium. Steak sauce. Canned and packaged gravies. Fish sauce. Oyster sauce. Cocktail sauce. Horseradish that you find on the shelf. Ketchup. Mustard. Meat flavorings and tenderizers. Bouillon cubes. Hot sauce and Tabasco sauce. Premade or packaged marinades. Premade or packaged taco seasonings. Relishes. Regular salad dressings. Where to find more information:  National Heart, Lung, and Blood Institute: www.nhlbi.nih.gov  American Heart Association: www.heart.org Summary  The DASH eating plan is a healthy eating plan that has been shown to reduce high blood pressure (hypertension). It may also reduce your risk for type 2 diabetes, heart disease, and stroke.  With the DASH eating plan, you should limit salt (sodium) intake to 2,300 mg a day. If you have hypertension, you may need to reduce your sodium intake to 1,500 mg a day.  When on the DASH eating plan, aim to eat more fresh fruits and vegetables, whole grains, lean proteins, low-fat dairy, and heart-healthy fats.  Work with your health care provider or diet and nutrition specialist (dietitian) to adjust your eating plan to your   individual calorie needs. This information is not intended to replace advice given to you by your health care provider. Make sure you discuss any questions you have with your health care provider. Document Revised: 05/26/2017 Document Reviewed: 06/06/2016 Elsevier Patient Education  2020 [employer] Inc.  

## 2020-04-22 DIAGNOSIS — M25551 Pain in right hip: Secondary | ICD-10-CM | POA: Insufficient documentation

## 2020-04-22 LAB — COMPREHENSIVE METABOLIC PANEL
ALT: 109 IU/L — ABNORMAL HIGH (ref 0–32)
AST: 83 IU/L — ABNORMAL HIGH (ref 0–40)
Albumin/Globulin Ratio: 1.4 (ref 1.2–2.2)
Albumin: 4.3 g/dL (ref 3.8–4.9)
Alkaline Phosphatase: 133 IU/L — ABNORMAL HIGH (ref 44–121)
BUN/Creatinine Ratio: 16 (ref 12–28)
BUN: 15 mg/dL (ref 8–27)
Bilirubin Total: 0.2 mg/dL (ref 0.0–1.2)
CO2: 24 mmol/L (ref 20–29)
Calcium: 9 mg/dL (ref 8.7–10.3)
Chloride: 98 mmol/L (ref 96–106)
Creatinine, Ser: 0.92 mg/dL (ref 0.57–1.00)
GFR calc Af Amer: 78 mL/min/{1.73_m2} (ref >59)
GFR calc non Af Amer: 68 mL/min/{1.73_m2} (ref >59)
Globulin, Total: 3 g/dL (ref 1.5–4.5)
Glucose: 89 mg/dL (ref 65–99)
Potassium: 3.9 mmol/L (ref 3.5–5.2)
Sodium: 137 mmol/L (ref 134–144)
Total Protein: 7.3 g/dL (ref 6.0–8.5)

## 2020-04-22 LAB — CBC WITH DIFFERENTIAL/PLATELET
Basophils Absolute: 0 10*3/uL (ref 0.0–0.2)
Basos: 1 %
EOS (ABSOLUTE): 0.2 10*3/uL (ref 0.0–0.4)
Eos: 4 %
Hematocrit: 45.3 % (ref 34.0–46.6)
Hemoglobin: 15.6 g/dL (ref 11.1–15.9)
Immature Grans (Abs): 0 10*3/uL (ref 0.0–0.1)
Immature Granulocytes: 0 %
Lymphocytes Absolute: 2.1 10*3/uL (ref 0.7–3.1)
Lymphs: 36 %
MCH: 34.4 pg — ABNORMAL HIGH (ref 26.6–33.0)
MCHC: 34.4 g/dL (ref 31.5–35.7)
MCV: 100 fL — ABNORMAL HIGH (ref 79–97)
Monocytes Absolute: 0.7 10*3/uL (ref 0.1–0.9)
Monocytes: 11 %
Neutrophils Absolute: 2.9 10*3/uL (ref 1.4–7.0)
Neutrophils: 48 %
Platelets: 184 10*3/uL (ref 150–450)
RBC: 4.53 x10E6/uL (ref 3.77–5.28)
RDW: 12 % (ref 11.7–15.4)
WBC: 5.9 10*3/uL (ref 3.4–10.8)

## 2020-04-22 LAB — HIV ANTIBODY (ROUTINE TESTING W REFLEX): HIV Screen 4th Generation wRfx: NONREACTIVE

## 2020-04-22 LAB — LIPID PANEL W/O CHOL/HDL RATIO
Cholesterol, Total: 186 mg/dL (ref 100–199)
HDL: 52 mg/dL (ref >39)
LDL Chol Calc (NIH): 107 mg/dL — ABNORMAL HIGH (ref 0–99)
Triglycerides: 156 mg/dL — ABNORMAL HIGH (ref 0–149)
VLDL Cholesterol Cal: 27 mg/dL (ref 5–40)

## 2020-04-22 LAB — TSH: TSH: 4.02 u[IU]/mL (ref 0.450–4.500)

## 2020-04-22 LAB — HEMOGLOBIN A1C
Est. average glucose Bld gHb Est-mCnc: 123 mg/dL
Hgb A1c MFr Bld: 5.9 % — ABNORMAL HIGH (ref 4.8–5.6)

## 2020-04-22 NOTE — Assessment & Plan Note (Signed)
Chronic, appears to be stable for patient.  Continue current inhalers as prescribed.

## 2020-04-22 NOTE — Assessment & Plan Note (Signed)
Acute, ongoing.  Encourage stretches, and rest as needed.  Will obtain hip x-ray today and start Voltaren gel.  With no improvement, may need referral to orthopedic.

## 2020-04-22 NOTE — Assessment & Plan Note (Signed)
Chronic, ongoing.  Blood pressure slightly above goal in office today and reports higher blood pressure at home as well.  Likely due to stress, however this has been ongoing for at least a year.  Will increase amlodipine to 7.5 mg daily today and follow-up in 1 month.  CMP, CBC checked today

## 2020-04-22 NOTE — Assessment & Plan Note (Addendum)
Chronic, ongoing.  Possibly exacerbated by Covid as well as increased stressors at work.  We will continue Seroquel and increase venlafaxine to 225 mg daily.  Patient agreeable to meet with psychiatrist-[facility]  Plan to bridge psychiatric medications until she can get in with psychiatrist.  Xanax 0.25 mg refilled-try to make last as long as possible.  Follow-up in 4 weeks.

## 2020-04-22 NOTE — Assessment & Plan Note (Signed)
Chronic, ongoing.  PHQ-9 not done today.  Will increase venlafaxine to 225 mg and give refill of Xanax to use very sparingly as needed.  Referral to [facility]ry placed today-[facility].  Plan to bridge patient until she can get in with psychiatrist.  Follow-up in 4 weeks.

## 2020-04-22 NOTE — Assessment & Plan Note (Signed)
Chronic, stable.  We will recheck CBC today.

## 2020-04-24 DIAGNOSIS — R7303 Prediabetes: Secondary | ICD-10-CM | POA: Insufficient documentation

## 2020-04-27 ENCOUNTER — Other Ambulatory Visit: Payer: Self-pay | Admitting: Nurse Practitioner

## 2020-04-27 DIAGNOSIS — F319 Bipolar disorder, unspecified: Secondary | ICD-10-CM

## 2020-04-27 DIAGNOSIS — F3341 Major depressive disorder, recurrent, in partial remission: Secondary | ICD-10-CM

## 2020-05-05 ENCOUNTER — Ambulatory Visit: Payer: PRIVATE HEALTH INSURANCE | Admitting: Nurse Practitioner

## 2020-05-06 ENCOUNTER — Other Ambulatory Visit: Payer: Self-pay

## 2020-05-06 ENCOUNTER — Encounter: Payer: Self-pay | Admitting: Ophthalmology

## 2020-05-11 ENCOUNTER — Other Ambulatory Visit: Payer: Self-pay

## 2020-05-11 ENCOUNTER — Other Ambulatory Visit
Admission: RE | Admit: 2020-05-11 | Discharge: 2020-05-11 | Disposition: A | Discharge status: Home / Self Care | Payer: PRIVATE HEALTH INSURANCE | Source: Ambulatory Visit | Attending: Ophthalmology | Admitting: Ophthalmology

## 2020-05-11 DIAGNOSIS — Z20822 Contact with and (suspected) exposure to covid-19: Secondary | ICD-10-CM | POA: Diagnosis not present

## 2020-05-11 DIAGNOSIS — Z01812 Encounter for preprocedural laboratory examination: Secondary | ICD-10-CM | POA: Diagnosis not present

## 2020-05-11 NOTE — Discharge Instructions (Signed)

## 2020-05-12 LAB — SARS CORONAVIRUS 2 (TAT 6-24 HRS): SARS Coronavirus 2: NEGATIVE

## 2020-05-13 ENCOUNTER — Other Ambulatory Visit: Payer: Self-pay

## 2020-05-13 ENCOUNTER — Ambulatory Visit
Admission: RE | Admit: 2020-05-13 | Discharge: 2020-05-13 | Disposition: A | Discharge status: Home / Self Care | Payer: PRIVATE HEALTH INSURANCE | Attending: Ophthalmology | Admitting: Ophthalmology

## 2020-05-13 ENCOUNTER — Encounter: Payer: Self-pay | Admitting: Ophthalmology

## 2020-05-13 ENCOUNTER — Encounter
Admission: RE | Disposition: A | Discharge status: Home / Self Care | Payer: Self-pay | Source: Home / Self Care | Attending: Ophthalmology

## 2020-05-13 ENCOUNTER — Ambulatory Visit: Payer: PRIVATE HEALTH INSURANCE | Admitting: Anesthesiology

## 2020-05-13 DIAGNOSIS — Z79890 Hormone replacement therapy (postmenopausal): Secondary | ICD-10-CM | POA: Diagnosis not present

## 2020-05-13 DIAGNOSIS — Z7951 Long term (current) use of inhaled steroids: Secondary | ICD-10-CM | POA: Insufficient documentation

## 2020-05-13 DIAGNOSIS — Z79899 Other long term (current) drug therapy: Secondary | ICD-10-CM | POA: Diagnosis not present

## 2020-05-13 DIAGNOSIS — H2512 Age-related nuclear cataract, left eye: Secondary | ICD-10-CM | POA: Diagnosis not present

## 2020-05-13 DIAGNOSIS — F17200 Nicotine dependence, unspecified, uncomplicated: Secondary | ICD-10-CM | POA: Diagnosis not present

## 2020-05-13 HISTORY — PX: CATARACT EXTRACTION W/PHACO: SHX586

## 2020-05-13 HISTORY — DX: Sciatica, unspecified side: M54.30

## 2020-05-13 SURGERY — PHACOEMULSIFICATION, CATARACT, WITH IOL INSERTION
Anesthesia: Monitor Anesthesia Care | Site: Eye | Laterality: Left

## 2020-05-13 MED ORDER — ARMC OPHTHALMIC DILATING DROPS
1.0000 "application " | OPHTHALMIC | Status: DC | PRN
  Administered 2020-05-13 (×3): 1 via OPHTHALMIC

## 2020-05-13 MED ORDER — FENTANYL CITRATE (PF) 100 MCG/2ML IJ SOLN
INTRAMUSCULAR | Status: DC | PRN
  Administered 2020-05-13 (×2): 50 ug via INTRAVENOUS

## 2020-05-13 MED ORDER — TETRACAINE HCL 0.5 % OP SOLN
1.0000 [drp] | OPHTHALMIC | Status: DC | PRN
  Administered 2020-05-13 (×3): 1 [drp] via OPHTHALMIC

## 2020-05-13 MED ORDER — MIDAZOLAM HCL 2 MG/2ML IJ SOLN
INTRAMUSCULAR | Status: DC | PRN
  Administered 2020-05-13: 2 mg via INTRAVENOUS

## 2020-05-13 MED ORDER — CEFUROXIME OPHTHALMIC INJECTION 1 MG/0.1 ML
INJECTION | OPHTHALMIC | Status: DC | PRN
  Administered 2020-05-13: 0.1 mL via INTRACAMERAL

## 2020-05-13 MED ORDER — LIDOCAINE HCL (PF) 2 % IJ SOLN
INTRAOCULAR | Status: DC | PRN
  Administered 2020-05-13: 1 mL

## 2020-05-13 MED ORDER — NA HYALUR & NA CHOND-NA HYALUR 0.4-0.35 ML IO KIT
PACK | INTRAOCULAR | Status: DC | PRN
  Administered 2020-05-13: 1 mL via INTRAOCULAR

## 2020-05-13 MED ORDER — BRIMONIDINE TARTRATE-TIMOLOL 0.2-0.5 % OP SOLN
OPHTHALMIC | Status: DC | PRN
  Administered 2020-05-13: 1 [drp] via OPHTHALMIC

## 2020-05-13 MED ORDER — EPINEPHRINE PF 1 MG/ML IJ SOLN
INTRAOCULAR | Status: DC | PRN
  Administered 2020-05-13: 47 mL via OPHTHALMIC

## 2020-05-13 SURGICAL SUPPLY — 29 items
CANNULA ANT/CHMB 27G (MISCELLANEOUS) ×1 IMPLANT
CANNULA ANT/CHMB 27GA (MISCELLANEOUS) ×3 IMPLANT
GLOVE SURG LX 7.5 STRW (GLOVE) ×2
GLOVE SURG LX STRL 7.5 STRW (GLOVE) ×1 IMPLANT
GLOVE SURG TRIUMPH 8.0 PF LTX (GLOVE) ×5 IMPLANT
GOWN STRL REUS W/ TWL LRG LVL3 (GOWN DISPOSABLE) ×2 IMPLANT
GOWN STRL REUS W/TWL LRG LVL3 (GOWN DISPOSABLE) ×6
LENS IOL DIOP 11.5 (Intraocular Lens) ×3 IMPLANT
LENS IOL TECNIS MONO 11.5 (Intraocular Lens) IMPLANT
MARKER SKIN DUAL TIP RULER LAB (MISCELLANEOUS) ×3 IMPLANT
NDL CAPSULORHEX 25GA (NEEDLE) ×1 IMPLANT
NDL FILTER BLUNT 18X1 1/2 (NEEDLE) ×2 IMPLANT
NDL RETROBULBAR .5 NSTRL (NEEDLE) IMPLANT
NEEDLE CAPSULORHEX 25GA (NEEDLE) ×3 IMPLANT
NEEDLE FILTER BLUNT 18X 1/2SAF (NEEDLE) ×4
NEEDLE FILTER BLUNT 18X1 1/2 (NEEDLE) ×2 IMPLANT
PACK CATARACT BRASINGTON (MISCELLANEOUS) ×3 IMPLANT
PACK EYE AFTER SURG (MISCELLANEOUS) ×3 IMPLANT
PACK OPTHALMIC (MISCELLANEOUS) ×3 IMPLANT
RING MALYGIN 7.0 (MISCELLANEOUS) IMPLANT
SOLUTION OPHTHALMIC SALT (MISCELLANEOUS) ×3 IMPLANT
SUT ETHILON 10-0 CS-B-6CS-B-6 (SUTURE)
SUT VICRYL  9 0 (SUTURE)
SUT VICRYL 9 0 (SUTURE) IMPLANT
SUTURE EHLN 10-0 CS-B-6CS-B-6 (SUTURE) IMPLANT
SYR 3ML LL SCALE MARK (SYRINGE) ×6 IMPLANT
SYR TB 1ML LUER SLIP (SYRINGE) ×3 IMPLANT
WATER STERILE IRR 250ML POUR (IV SOLUTION) ×3 IMPLANT
WIPE NON LINTING 3.25X3.25 (MISCELLANEOUS) ×3 IMPLANT

## 2020-05-13 NOTE — Transfer of Care (Signed)
Immediate Anesthesia Transfer of Care Note  Patient: Monique Tucker  Procedure(s) Performed: CATARACT EXTRACTION PHACO AND INTRAOCULAR LENS PLACEMENT (IOC) LEFT (Left Eye)  Patient Location: PACU  Anesthesia Type: MAC  Level of Consciousness: awake, alert  and patient cooperative  Airway and Oxygen Therapy: Patient Spontanous Breathing and Patient connected to supplemental oxygen  Post-op Assessment: Post-op Vital signs reviewed, Patient's Cardiovascular Status Stable, Respiratory Function Stable, Patent Airway and No signs of Nausea or vomiting  Post-op Vital Signs: Reviewed and stable  Complications: No complications documented.

## 2020-05-13 NOTE — Anesthesia Preprocedure Evaluation (Signed)
Anesthesia Evaluation  Patient identified by MRN, date of birth, ID band Patient awake    Reviewed: Allergy & Precautions, H&P , NPO status , Patient's Chart, lab work & pertinent test results  Airway Mallampati: II  TM Distance: >3 FB Neck ROM: full    Dental no notable dental hx.    Pulmonary COPD, Current Smoker and Patient abstained from smoking.,    Pulmonary exam normal breath sounds clear to auscultation       Cardiovascular hypertension, Normal cardiovascular exam Rhythm:regular Rate:Normal     Neuro/Psych PSYCHIATRIC DISORDERS    GI/Hepatic   Endo/Other    Renal/GU      Musculoskeletal   Abdominal   Peds  Hematology   Anesthesia Other Findings   Reproductive/Obstetrics                             Anesthesia Physical Anesthesia Plan  ASA: II  Anesthesia Plan: MAC   Post-op Pain Management:    Induction:   PONV Risk Score and Plan: 2 and Treatment may vary due to age or medical condition, TIVA and Midazolam  Airway Management Planned:   Additional Equipment:   Intra-op Plan:   Post-operative Plan:   Informed Consent: I have reviewed the patients History and Physical, chart, labs and discussed the procedure including the risks, benefits and alternatives for the proposed anesthesia with the patient or authorized representative who has indicated his/her understanding and acceptance.     Dental Advisory Given  Plan Discussed with: CRNA  Anesthesia Plan Comments:         Anesthesia Quick Evaluation

## 2020-05-13 NOTE — Op Note (Signed)
OPERATIVE NOTE  SYVILLA MARTIN [id number] 05/13/2020   PREOPERATIVE DIAGNOSIS:  Nuclear sclerotic cataract left eye. H25.12   POSTOPERATIVE DIAGNOSIS:    Nuclear sclerotic cataract left eye.     PROCEDURE:  Phacoemusification with posterior chamber intraocular lens placement of the left eye  Ultrasound time: Procedure(s) with comments: CATARACT EXTRACTION PHACO AND INTRAOCULAR LENS PLACEMENT ([facility]) LEFT (Left) - 5.15 049.8 10.4%  LENS:   Implant Name Type Inv. Item Serial No. Manufacturer Lot No. LRB No. Used Action  LENS IOL DIOP 11.5 - [id number] Intraocular Lens LENS IOL DIOP 11.5 [id number] [id number]   Left 1 Implanted      SURGEON:  Wyonia Hough, MD   ANESTHESIA:  Topical with tetracaine drops and 2% Xylocaine jelly, augmented with 1% preservative-free intracameral lidocaine.    COMPLICATIONS:  None.   DESCRIPTION OF PROCEDURE:  The patient was identified in the holding room and transported to the operating room and placed in the supine position under the operating microscope.  The left eye was identified as the operative eye and it was prepped and draped in the usual sterile ophthalmic fashion.   A 1 millimeter clear-corneal paracentesis was made at the 1:30 position.  0.5 ml of preservative-free 1% lidocaine was injected into the anterior chamber.  The anterior chamber was filled with Viscoat viscoelastic.  A 2.4 millimeter keratome was used to make a near-clear corneal incision at the 10:30 position.  .  A curvilinear capsulorrhexis was made with a cystotome and capsulorrhexis forceps.  Balanced salt solution was used to hydrodissect and hydrodelineate the nucleus.   Phacoemulsification was then used in stop and chop fashion to remove the lens nucleus and epinucleus.  The remaining cortex was then removed using the irrigation and aspiration handpiece. Provisc was then placed into the capsular bag to distend it for lens placement.  A lens was then injected into the  capsular bag.  The remaining viscoelastic was aspirated.   Wounds were hydrated with balanced salt solution.  The anterior chamber was inflated to a physiologic pressure with balanced salt solution.  No wound leaks were noted. Cefuroxime 0.1 ml of a 10mg /ml solution was injected into the anterior chamber for a dose of 1 mg of intracameral antibiotic at the completion of the case.   Timolol and Brimonidine drops were applied to the eye.  The patient was taken to the recovery room in stable condition without complications of anesthesia or surgery.  Diamone Whistler 05/13/2020, 12:45 PM

## 2020-05-13 NOTE — Anesthesia Procedure Notes (Signed)
Procedure Name: MAC Date/Time: 05/13/2020 12:29 PM Performed by: Silvana Newness, CRNA Pre-anesthesia Checklist: Patient identified, Emergency Drugs available, Suction available, Patient being monitored and Timeout performed Patient Re-evaluated:Patient Re-evaluated prior to induction Oxygen Delivery Method: Nasal cannula Placement Confirmation: positive ETCO2

## 2020-05-13 NOTE — H&P (Signed)

## 2020-05-13 NOTE — Anesthesia Postprocedure Evaluation (Signed)
Anesthesia Post Note  Patient: Monique Tucker  Procedure(s) Performed: CATARACT EXTRACTION PHACO AND INTRAOCULAR LENS PLACEMENT ([place]) LEFT (Left Eye)     Patient location during evaluation: PACU Anesthesia Type: MAC Level of consciousness: awake and alert and oriented Pain management: satisfactory to patient Vital Signs Assessment: post-procedure vital signs reviewed and stable Respiratory status: spontaneous breathing, nonlabored ventilation and respiratory function stable Cardiovascular status: blood pressure returned to baseline and stable Postop Assessment: Adequate PO intake and No signs of nausea or vomiting Anesthetic complications: no   No complications documented.  Raliegh Ip

## 2020-05-14 ENCOUNTER — Encounter: Payer: Self-pay | Admitting: Ophthalmology

## 2020-05-19 ENCOUNTER — Encounter: Payer: Self-pay | Admitting: Ophthalmology

## 2020-05-19 ENCOUNTER — Other Ambulatory Visit: Payer: Self-pay

## 2020-05-25 ENCOUNTER — Other Ambulatory Visit
Admission: RE | Admit: 2020-05-25 | Discharge: 2020-05-25 | Disposition: A | Discharge status: Home / Self Care | Payer: PRIVATE HEALTH INSURANCE | Source: Ambulatory Visit | Attending: Ophthalmology | Admitting: Ophthalmology

## 2020-05-25 ENCOUNTER — Other Ambulatory Visit: Payer: Self-pay

## 2020-05-25 DIAGNOSIS — Z20822 Contact with and (suspected) exposure to covid-19: Secondary | ICD-10-CM | POA: Insufficient documentation

## 2020-05-25 DIAGNOSIS — Z01812 Encounter for preprocedural laboratory examination: Secondary | ICD-10-CM | POA: Insufficient documentation

## 2020-05-25 LAB — SARS CORONAVIRUS 2 (TAT 6-24 HRS): SARS Coronavirus 2: NEGATIVE

## 2020-05-26 ENCOUNTER — Ambulatory Visit: Payer: PRIVATE HEALTH INSURANCE | Admitting: Nurse Practitioner

## 2020-05-26 MED ORDER — LACTATED RINGERS IV SOLN: INTRAVENOUS | Status: DC

## 2020-05-26 MED ORDER — ARMC OPHTHALMIC DILATING DROPS
1.0000 "application " | OPHTHALMIC | Status: DC | PRN
  Administered 2020-05-27 (×3): 1 via OPHTHALMIC

## 2020-05-26 MED ORDER — SODIUM CHLORIDE 0.9 % IV SOLN: INTRAVENOUS | Status: DC

## 2020-05-26 MED ORDER — TETRACAINE HCL 0.5 % OP SOLN
1.0000 [drp] | OPHTHALMIC | Status: DC | PRN
  Administered 2020-05-27 (×3): 1 [drp] via OPHTHALMIC

## 2020-05-27 ENCOUNTER — Other Ambulatory Visit: Payer: Self-pay

## 2020-05-27 ENCOUNTER — Ambulatory Visit
Admission: RE | Admit: 2020-05-27 | Discharge: 2020-05-27 | Disposition: A | Discharge status: Home / Self Care | Payer: PRIVATE HEALTH INSURANCE | Attending: Ophthalmology | Admitting: Ophthalmology

## 2020-05-27 ENCOUNTER — Ambulatory Visit
Admission: RE | Admit: 2020-05-27 | Payer: PRIVATE HEALTH INSURANCE | Source: Home / Self Care | Admitting: Ophthalmology

## 2020-05-27 ENCOUNTER — Encounter: Payer: Self-pay | Admitting: Ophthalmology

## 2020-05-27 ENCOUNTER — Ambulatory Visit: Payer: PRIVATE HEALTH INSURANCE | Admitting: Anesthesiology

## 2020-05-27 ENCOUNTER — Encounter
Admission: RE | Disposition: A | Discharge status: Home / Self Care | Payer: Self-pay | Source: Home / Self Care | Attending: Ophthalmology

## 2020-05-27 DIAGNOSIS — Z79899 Other long term (current) drug therapy: Secondary | ICD-10-CM | POA: Insufficient documentation

## 2020-05-27 DIAGNOSIS — Z79890 Hormone replacement therapy (postmenopausal): Secondary | ICD-10-CM | POA: Diagnosis not present

## 2020-05-27 DIAGNOSIS — H2511 Age-related nuclear cataract, right eye: Secondary | ICD-10-CM | POA: Insufficient documentation

## 2020-05-27 DIAGNOSIS — F17200 Nicotine dependence, unspecified, uncomplicated: Secondary | ICD-10-CM | POA: Insufficient documentation

## 2020-05-27 HISTORY — PX: CATARACT EXTRACTION W/PHACO: SHX586

## 2020-05-27 SURGERY — PHACOEMULSIFICATION, CATARACT, WITH IOL INSERTION
Anesthesia: Monitor Anesthesia Care | Site: Eye | Laterality: Right

## 2020-05-27 MED ORDER — NA HYALUR & NA CHOND-NA HYALUR 0.4-0.35 ML IO KIT
PACK | INTRAOCULAR | Status: DC | PRN
  Administered 2020-05-27: 1 mL via INTRAOCULAR

## 2020-05-27 MED ORDER — FENTANYL CITRATE (PF) 100 MCG/2ML IJ SOLN
INTRAMUSCULAR | Status: DC | PRN
  Administered 2020-05-27 (×2): 50 ug via INTRAVENOUS

## 2020-05-27 MED ORDER — CEFUROXIME OPHTHALMIC INJECTION 1 MG/0.1 ML
INJECTION | OPHTHALMIC | Status: DC | PRN
  Administered 2020-05-27: 0.1 mL via INTRACAMERAL

## 2020-05-27 MED ORDER — FENTANYL CITRATE (PF) 100 MCG/2ML IJ SOLN
INTRAMUSCULAR | Status: AC
  Filled 2020-05-27: qty 2

## 2020-05-27 MED ORDER — EPINEPHRINE PF 1 MG/ML IJ SOLN
INTRAOCULAR | Status: DC | PRN
  Administered 2020-05-27: 59 mL via OPHTHALMIC

## 2020-05-27 MED ORDER — MIDAZOLAM HCL 2 MG/2ML IJ SOLN
INTRAMUSCULAR | Status: DC | PRN
  Administered 2020-05-27 (×2): 1 mg via INTRAVENOUS

## 2020-05-27 MED ORDER — BRIMONIDINE TARTRATE-TIMOLOL 0.2-0.5 % OP SOLN
OPHTHALMIC | Status: DC | PRN
  Administered 2020-05-27: 1 [drp] via OPHTHALMIC

## 2020-05-27 MED ORDER — FENTANYL CITRATE (PF) 100 MCG/2ML IJ SOLN: 25.0000 ug | INTRAMUSCULAR | Status: DC | PRN

## 2020-05-27 MED ORDER — ONDANSETRON HCL 4 MG/2ML IJ SOLN: 4.0000 mg | Freq: Once | INTRAMUSCULAR | Status: DC | PRN

## 2020-05-27 MED ORDER — LIDOCAINE HCL (PF) 2 % IJ SOLN
INTRAOCULAR | Status: DC | PRN
  Administered 2020-05-27: 1 mL

## 2020-05-27 MED ORDER — MIDAZOLAM HCL 2 MG/2ML IJ SOLN
INTRAMUSCULAR | Status: AC
  Filled 2020-05-27: qty 2

## 2020-05-27 SURGICAL SUPPLY — 23 items
CANNULA ANT/CHMB 27G (MISCELLANEOUS) IMPLANT
CANNULA ANT/CHMB 27GA (MISCELLANEOUS) ×3 IMPLANT
GLOVE BIO SURGEON STRL SZ8 (GLOVE) ×3 IMPLANT
GLOVE BIOGEL M 6.5 STRL (GLOVE) ×3 IMPLANT
GLOVE SURG LX 7.5 STRW (GLOVE) ×2
GLOVE SURG LX STRL 7.5 STRW (GLOVE) ×1 IMPLANT
GOWN STRL REUS W/ TWL LRG LVL3 (GOWN DISPOSABLE) ×2 IMPLANT
GOWN STRL REUS W/TWL LRG LVL3 (GOWN DISPOSABLE) ×6
LENS IOL DIOP 11.0 (Intraocular Lens) ×3 IMPLANT
LENS IOL TECNIS MONO 11.0 (Intraocular Lens) IMPLANT
MARKER SKIN DUAL TIP RULER LAB (MISCELLANEOUS) ×2 IMPLANT
NDL CAPSULORHEX 25GA (NEEDLE) IMPLANT
NDL FILTER BLUNT 18X1 1/2 (NEEDLE) IMPLANT
NEEDLE CAPSULORHEX 25GA (NEEDLE) ×3 IMPLANT
NEEDLE FILTER BLUNT 18X 1/2SAF (NEEDLE) ×4
NEEDLE FILTER BLUNT 18X1 1/2 (NEEDLE) ×2 IMPLANT
PACK CATARACT (MISCELLANEOUS) ×3 IMPLANT
PACK CATARACT BRASINGTON LX (MISCELLANEOUS) ×3 IMPLANT
PACK EYE AFTER SURG (MISCELLANEOUS) ×3 IMPLANT
SYR 3ML LL SCALE MARK (SYRINGE) ×4 IMPLANT
SYR TB 1ML LUER SLIP (SYRINGE) ×2 IMPLANT
WATER STERILE IRR 250ML POUR (IV SOLUTION) ×3 IMPLANT
WIPE NON LINTING 3.25X3.25 (MISCELLANEOUS) ×3 IMPLANT

## 2020-05-27 NOTE — Anesthesia Postprocedure Evaluation (Signed)
Anesthesia Post Note  Patient: Monique Tucker  Procedure(s) Performed: CATARACT EXTRACTION PHACO AND INTRAOCULAR LENS PLACEMENT ([place]) RIGHT (Right Eye)  Patient location during evaluation: [facility] Recovery Anesthesia Type: MAC Level of consciousness: awake and awake and alert Pain management: pain level controlled Vital Signs Assessment: post-procedure vital signs reviewed and stable Respiratory status: spontaneous breathing Cardiovascular status: blood pressure returned to baseline and stable Postop Assessment: no apparent nausea or vomiting Anesthetic complications: no   No complications documented.   Last Vitals:  Vitals:   05/27/20 1309 05/27/20 1456  BP: (!) 153/69 127/62  Pulse: 62 (!) 57  Resp:  14  Temp: 36.4 C 36.5 C  SpO2: 97% 94%    Last Pain:  Vitals:   05/27/20 1456  TempSrc:   PainSc: 0-No pain                 Neva Seat

## 2020-05-27 NOTE — Discharge Instructions (Signed)

## 2020-05-27 NOTE — Op Note (Signed)
  PREOPERATIVE DIAGNOSIS:    Nuclear sclerotic cataract right eye. H25.11   POSTOPERATIVE DIAGNOSIS:  Nuclear sclerotic cataract right eye.     PROCEDURE:  Phacoemusification with posterior chamber intraocular lens placement of the right eye   ULTRASOUND TIME: Procedure(s) with comments: CATARACT EXTRACTION PHACO AND INTRAOCULAR LENS PLACEMENT ([facility]) RIGHT (Right) - 3.29 0:55.2 6.0%  LENS:   Implant Name Type Inv. Item Serial No. Manufacturer Lot No. LRB No. Used Action  LENS IOL DIOP 11.0 - [id number] Intraocular Lens LENS IOL DIOP 11.0 [id number]   Right 1 Implanted         SURGEON:  Wyonia Hough, MD   ANESTHESIA:  Topical with tetracaine drops and 2% Xylocaine jelly, augmented with 1% preservative-free intracameral lidocaine.    COMPLICATIONS:  None.   DESCRIPTION OF PROCEDURE:  The patient was identified in the holding room and transported to the operating room and placed in the supine position under the operating microscope.  The right eye was identified as the operative eye and it was prepped and draped in the usual sterile ophthalmic fashion.   A 1 millimeter clear-corneal paracentesis was made at the 12:00 position.  0.5 ml of preservative-free 1% lidocaine was injected into the anterior chamber. The anterior chamber was filled with Viscoat viscoelastic.  A 2.4 millimeter keratome was used to make a near-clear corneal incision at the 9:00 position.  A curvilinear capsulorrhexis was made with a cystotome and capsulorrhexis forceps.  Balanced salt solution was used to hydrodissect and hydrodelineate the nucleus.   Phacoemulsification was then used in stop and chop fashion to remove the lens nucleus and epinucleus.  The remaining cortex was then removed using the irrigation and aspiration handpiece. Provisc was then placed into the capsular bag to distend it for lens placement.  A lens was then injected into the capsular bag.  The remaining viscoelastic was  aspirated.   Wounds were hydrated with balanced salt solution.  The anterior chamber was inflated to a physiologic pressure with balanced salt solution.  No wound leaks were noted. Cefuroxime 0.1 ml of a 10mg /ml solution was injected into the anterior chamber for a dose of 1 mg of intracameral antibiotic at the completion of the case.   Timolol and Brimonidine drops were applied to the eye.  The patient was taken to the recovery room in stable condition without complications of anesthesia or surgery.   Stefan Markarian 05/27/2020, 2:53 PM

## 2020-05-27 NOTE — H&P (Signed)

## 2020-05-27 NOTE — Transfer of Care (Signed)
Immediate Anesthesia Transfer of Care Note  Patient: Monique Tucker  Procedure(s) Performed: CATARACT EXTRACTION PHACO AND INTRAOCULAR LENS PLACEMENT ([facility]) RIGHT (Right Eye)  Patient Location: PACU and Endoscopy Unit  Anesthesia Type:MAC  Level of Consciousness: awake  Airway & Oxygen Therapy: Patient Spontanous Breathing  Post-op Assessment: Report given to RN  Post vital signs: stable  Last Vitals:  Vitals Value Taken Time  BP    Temp    Pulse    Resp    SpO2      Last Pain:  Vitals:   05/27/20 1309  TempSrc: Temporal  PainSc: 0-No pain         Complications: No complications documented.

## 2020-05-27 NOTE — Anesthesia Preprocedure Evaluation (Signed)
Anesthesia Evaluation  Patient identified by MRN, date of birth, ID band Patient awake    Reviewed: Allergy & Precautions, H&P , NPO status , Patient's Chart, lab work & pertinent test results  Airway Mallampati: II  TM Distance: >3 FB Neck ROM: full    Dental no notable dental hx.    Pulmonary COPD, Current Smoker and Patient abstained from smoking.,    Pulmonary exam normal breath sounds clear to auscultation       Cardiovascular hypertension, Normal cardiovascular exam Rhythm:regular Rate:Normal     Neuro/Psych PSYCHIATRIC DISORDERS    GI/Hepatic (+) Hepatitis -, C  Endo/Other    Renal/GU      Musculoskeletal   Abdominal   Peds  Hematology   Anesthesia Other Findings   Reproductive/Obstetrics                             Anesthesia Physical  Anesthesia Plan  ASA: III  Anesthesia Plan: MAC   Post-op Pain Management:    Induction:   PONV Risk Score and Plan: 2 and Treatment may vary due to age or medical condition, TIVA and Midazolam  Airway Management Planned: Nasal Cannula  Additional Equipment:   Intra-op Plan:   Post-operative Plan:   Informed Consent: I have reviewed the patients History and Physical, chart, labs and discussed the procedure including the risks, benefits and alternatives for the proposed anesthesia with the patient or authorized representative who has indicated his/her understanding and acceptance.     Dental Advisory Given  Plan Discussed with: CRNA  Anesthesia Plan Comments:         Anesthesia Quick Evaluation

## 2020-05-29 ENCOUNTER — Other Ambulatory Visit: Payer: Self-pay

## 2020-05-29 MED ORDER — AMLODIPINE BESYLATE 2.5 MG PO TABS
2.5000 mg | ORAL_TABLET | Freq: Every day | ORAL | 4 refills | Status: DC
Start: 2020-05-29 — End: 2021-04-30

## 2020-06-12 ENCOUNTER — Other Ambulatory Visit: Payer: Self-pay

## 2020-06-12 ENCOUNTER — Ambulatory Visit: Payer: PRIVATE HEALTH INSURANCE | Admitting: Unknown Physician Specialty

## 2020-06-12 ENCOUNTER — Encounter: Payer: Self-pay | Admitting: Unknown Physician Specialty

## 2020-06-12 VITALS — BP 146/80 | HR 71 | Temp 98.2°F | Wt 135.0 lb

## 2020-06-12 DIAGNOSIS — R748 Abnormal levels of other serum enzymes: Secondary | ICD-10-CM | POA: Diagnosis not present

## 2020-06-12 DIAGNOSIS — F3341 Major depressive disorder, recurrent, in partial remission: Secondary | ICD-10-CM | POA: Diagnosis not present

## 2020-06-12 DIAGNOSIS — I10 Essential (primary) hypertension: Secondary | ICD-10-CM

## 2020-06-12 MED ORDER — QUETIAPINE FUMARATE ER 150 MG PO TB24
150.0000 mg | ORAL_TABLET | Freq: Every day | ORAL | 0 refills | Status: DC
Start: 2020-06-12 — End: 2021-04-30

## 2020-06-12 MED ORDER — VENLAFAXINE HCL ER 150 MG PO CP24: 150.0000 mg | ORAL_CAPSULE | Freq: Every day | ORAL | 0 refills | Status: AC

## 2020-06-12 NOTE — Assessment & Plan Note (Signed)
Stable at home with good numbers, continue present medications.

## 2020-06-12 NOTE — Assessment & Plan Note (Signed)
With bipolar.  Will refill Seroquel and effecor.  Appt with psychiatry later this month

## 2020-06-12 NOTE — Progress Notes (Signed)
BP (!) 146/80 (BP Location: Left Arm)   Pulse 71   Temp 98.2 F (36.8 C)   Wt 135 lb (61.2 kg)   LMP  (LMP Unknown)   SpO2 95%   BMI 23.17 kg/m    Subjective:    Patient ID: Monique Tucker, female    DOB: Jul 07, 1960, 59 y.o.   MRN: [id number]  HPI: Monique Tucker is a 59 y.o. female  Chief Complaint  Patient presents with  . Medication Refill  . Depression  . Manic Behavior  . Hypertension   Depression/mania  States she has started halving her Seroquel as she was running out.  Takes the occasional Xanax.  Has an appointment set up with psychiatry.    Depression screen [facility] 2/9 06/12/2020 08/28/2019 07/17/2019 06/17/2019 03/14/2019  Decreased Interest 2 2 2 3 2   Down, Depressed, Hopeless 2 1 1 2  0  PHQ - 2 Score 4 3 3 5 2   Altered sleeping 2 0 1 1 1   Tired, decreased energy 2 1 0 1 0  Change in appetite 2 2 3 2  0  Feeling bad or failure about yourself  0 0 0 0 0  Trouble concentrating 0 3 1 3  0  Moving slowly or fidgety/restless 0 0 0 2 0  Suicidal thoughts 0 0 0 0 0  PHQ-9 Score 10 9 8 14 3   Difficult doing work/chores Somewhat difficult Somewhat difficult - - Somewhat difficult   Hypertension Using medications without difficulty but admits to sometimes missing a pill.   Average home BPs Every now and then   No problems or lightheadedness No chest pain with exertion or shortness of breath No Edema  Elevated liver enzymes noted last visit.  Admits to 2-3 glasses of wine/day  The 10-year ASCVD risk score Mikey Bussing DC Brooke Bonito., et al., 2013) is: 11.6%   Values used to calculate the score:     Age: 54 years     Sex: Female     Is Non-Hispanic African American: No     Diabetic: No     Tobacco smoker: Yes     Systolic Blood Pressure: 878 mmHg     Is BP treated: Yes     HDL Cholesterol: 52 mg/dL     Total Cholesterol: 186 mg/dL    Relevant past medical, surgical, family and social history reviewed and updated as indicated. Interim medical history since our last visit  reviewed. Allergies and medications reviewed and updated.  Review of Systems  Neurological:       States she is very forgetful and frequently loses things    Per HPI unless specifically indicated above     Objective:    BP (!) 146/80 (BP Location: Left Arm)   Pulse 71   Temp 98.2 F (36.8 C)   Wt 135 lb (61.2 kg)   LMP  (LMP Unknown)   SpO2 95%   BMI 23.17 kg/m   Wt Readings from Last 3 Encounters:  06/12/20 135 lb (61.2 kg)  05/27/20 134 lb 14.7 oz (61.2 kg)  05/13/20 134 lb (60.8 kg)    Physical Exam Constitutional:      General: She is not in acute distress.    Appearance: Normal appearance. She is well-developed and well-nourished.  HENT:     Head: Normocephalic and atraumatic.  Eyes:     General: Lids are normal. No scleral icterus.       Right eye: No discharge.        Left  eye: No discharge.     Conjunctiva/sclera: Conjunctivae normal.  Neck:     Vascular: No carotid bruit or JVD.  Cardiovascular:     Rate and Rhythm: Normal rate and regular rhythm.     Heart sounds: Normal heart sounds.  Pulmonary:     Effort: Pulmonary effort is normal.     Breath sounds: Normal breath sounds.  Abdominal:     Palpations: There is no hepatomegaly or splenomegaly.  Musculoskeletal:        General: Normal range of motion.     Cervical back: Normal range of motion and neck supple.  Skin:    General: Skin is warm, dry and intact.     Coloration: Skin is not pale.     Findings: No rash.  Neurological:     Mental Status: She is alert and oriented to person, place, and time.  Psychiatric:        Mood and Affect: Mood and affect normal.        Behavior: Behavior normal.        Thought Content: Thought content normal.        Judgment: Judgment normal.     Results for orders placed or performed during the hospital encounter of 05/25/20  SARS CORONAVIRUS 2 (TAT 6-24 HRS) Nasopharyngeal Nasopharyngeal Swab   Specimen: Nasopharyngeal Swab  Result Value Ref Range   SARS  Coronavirus 2 NEGATIVE NEGATIVE      Assessment & Plan:   Problem List Items Addressed This Visit      Unprioritized   Depression    With bipolar.  Will refill Seroquel and effecor.  Appt with psychiatry later this month      Relevant Medications   venlafaxine XR (EFFEXOR-XR) 150 MG 24 hr capsule   Hypertension    Stable at home with good numbers, continue present medications.        Relevant Orders   Comprehensive metabolic panel    Other Visit Diagnoses    Elevated liver enzymes    -  Primary   Check AST/ALT   Relevant Orders   Comprehensive metabolic panel       Follow up plan: Return in about 6 months (around 12/11/2020).

## 2020-06-13 LAB — COMPREHENSIVE METABOLIC PANEL
ALT: 70 IU/L — ABNORMAL HIGH (ref 0–32)
AST: 68 IU/L — ABNORMAL HIGH (ref 0–40)
Albumin/Globulin Ratio: 1.4 (ref 1.2–2.2)
Albumin: 4.2 g/dL (ref 3.8–4.9)
Alkaline Phosphatase: 144 IU/L — ABNORMAL HIGH (ref 44–121)
BUN/Creatinine Ratio: 17 (ref 12–28)
BUN: 13 mg/dL (ref 8–27)
Bilirubin Total: 0.3 mg/dL (ref 0.0–1.2)
CO2: 25 mmol/L (ref 20–29)
Calcium: 9.5 mg/dL (ref 8.7–10.3)
Chloride: 101 mmol/L (ref 96–106)
Creatinine, Ser: 0.75 mg/dL (ref 0.57–1.00)
GFR calc Af Amer: 100 mL/min/{1.73_m2} (ref >59)
GFR calc non Af Amer: 87 mL/min/{1.73_m2} (ref >59)
Globulin, Total: 3 g/dL (ref 1.5–4.5)
Glucose: 72 mg/dL (ref 65–99)
Potassium: 3.8 mmol/L (ref 3.5–5.2)
Sodium: 139 mmol/L (ref 134–144)
Total Protein: 7.2 g/dL (ref 6.0–8.5)

## 2020-06-16 NOTE — Progress Notes (Signed)
Patient notified

## 2020-08-07 ENCOUNTER — Other Ambulatory Visit: Payer: Self-pay | Admitting: Nurse Practitioner

## 2020-10-07 ENCOUNTER — Other Ambulatory Visit: Payer: Self-pay | Admitting: Nurse Practitioner

## 2020-11-16 ENCOUNTER — Ambulatory Visit: Payer: Self-pay | Admitting: *Deleted

## 2020-11-16 NOTE — Telephone Encounter (Signed)
Patient called to report episode on Friday 11/13/20 at work of slurred speech and dry mouth. Patient reports she felt as if she only had dry mouth and needed some candy. Patient's boss requested her to call her Dr. Patient reports she was feeling better and did not call until today. Reports she has noticed making simple mistakes and forgetting words at times. Dizziness noted and esp. When turning head. Patient able to check B/P for 160/80 at beginning of call. And atleast 5 minutes later B/P 145/77. Patient reported noticing her balance when walking she leans to the right and does catch herself at times on the wall. She has drops "things" out of both hands not just one. Difficulty finding words at times. She has noticed arms "twitching'. Patient reports she is not having any symptoms that will make her go to ED now. Encouraged patient to go to ED now and be assessed due to multiple symptoms since Friday. Patient refused and reports if symptoms worsen she will go . appt scheduled for 11/18/20. patient reports she has had some of her medications changed from psychiatrist. Denies headache , blurred vision. Slurred speech , weakness N/T on either side at this time. Care advise given. Patient verbalized understanding of care advise and to call back or go to ED or call 911 if symptoms worsen. Please advise.  Reason for Disposition . [1] Loss of speech or garbled speech AND [2] sudden onset AND [3] brief (now gone)  Answer Assessment - Initial Assessment Questions 1. SYMPTOM: "What is the main symptom you are concerned about?" (e.g., weakness, numbness)     Slurred speech, dry mouth at work Friday 11/13/20 at 0600am 2. ONSET: "When did this start?" (minutes, hours, days; while sleeping)     5/20 22 0600 am 3. LAST NORMAL: "When was the last time you (the patient) were normal (no symptoms)?"     Patient reports she has no symptoms now  4. PATTERN "Does this come and go, or has it been constant since it started?"   "Is it present now?"     Symptoms come and go  5. CARDIAC SYMPTOMS: "Have you had any of the following symptoms: chest pain, difficulty breathing, palpitations?"     No  6. NEUROLOGIC SYMPTOMS: "Have you had any of the following symptoms: headache, dizziness, vision loss, double vision, changes in speech, unsteady on your feet?"     Dizziness when turning head, leans to right when walking, difficulty finding words at times, forgetful. Drops things at times from both hands  7. OTHER SYMPTOMS: "Do you have any other symptoms?"     Making simple mistakes  8. PREGNANCY: "Is there any chance you are pregnant?" "When was your last menstrual period?"     na  Protocols used: NEUROLOGIC DEFICIT-A-AH

## 2020-11-17 NOTE — Telephone Encounter (Signed)
FYI scheduled 5/25

## 2020-11-18 ENCOUNTER — Ambulatory Visit: Payer: PRIVATE HEALTH INSURANCE | Admitting: Nurse Practitioner

## 2020-11-18 ENCOUNTER — Encounter: Payer: Self-pay | Admitting: Nurse Practitioner

## 2020-11-18 ENCOUNTER — Other Ambulatory Visit: Payer: Self-pay

## 2020-11-18 VITALS — BP 134/80 | HR 61 | Temp 98.4°F | Wt 136.4 lb

## 2020-11-18 DIAGNOSIS — R748 Abnormal levels of other serum enzymes: Secondary | ICD-10-CM | POA: Diagnosis not present

## 2020-11-18 DIAGNOSIS — E7800 Pure hypercholesterolemia, unspecified: Secondary | ICD-10-CM | POA: Insufficient documentation

## 2020-11-18 DIAGNOSIS — R4781 Slurred speech: Secondary | ICD-10-CM

## 2020-11-18 MED ORDER — ESTRADIOL 0.1 MG/GM VA CREA: 0.5000 | TOPICAL_CREAM | VAGINAL | 11 refills | Status: DC

## 2020-11-18 MED ORDER — EPINEPHRINE 0.3 MG/0.3ML IJ SOAJ: INTRAMUSCULAR | 1 refills | Status: DC

## 2020-11-18 MED ORDER — SPIRIVA RESPIMAT 2.5 MCG/ACT IN AERS: INHALATION_SPRAY | RESPIRATORY_TRACT | 11 refills | Status: DC

## 2020-11-18 NOTE — Assessment & Plan Note (Signed)
Discussed with patient that she may have had a stroke or TIA. Will obtain MRI of the brain. Placed urgent referral to Neurology for further evaluation.  CMP and CBC ordered today.  Follow up after labs and imaging are complete.

## 2020-11-18 NOTE — Progress Notes (Signed)
BP 134/80   Pulse 61   Temp 98.4 F (36.9 C) (Oral)   Wt 136 lb 6.4 oz (61.9 kg)   LMP  (LMP Unknown)   BMI 23.41 kg/m    Subjective:    Patient ID: Monique Tucker, female    DOB: [birth year], 60 y.o.   MRN: [id number]  HPI: Monique Tucker is a 60 y.o. female  Chief Complaint  Patient presents with  . Altered Mental Status    Have been being very forgetful, dry mouth, slurring speech since this past Friday. Her boss told her to go home and call her PCP and take an Asprin. Patient states that she is having word retrieval problems and forgetting what she is talking about in mid sentence. Patient states that she has a headache since Friday and has no grip     Patient states that either Thursday or Friday last she started having slurred speech and having difficulty following a conversation.  She went home from work and took an ASA.  Patient has had a headache since last week.  Since then she has been having trouble recalling things, word retrieval, and forgetting what she was saying in mid sentence.  States she is having trouble with balance, decrease in strength.  Patient states both hands will just twitch at times. Patient states she is forgetting how to spell words.  The forgetfulness has been going on for awhile but has worsened since this episode that happened last week.   Denies CP, SOB, dizziness, palpitations, visual changes, and lower extremity swelling.  Review of Systems  Eyes: Negative for visual disturbance.  Respiratory: Negative for cough, chest tightness and shortness of breath.   Cardiovascular: Negative for chest pain, palpitations and leg swelling.  Neurological: Positive for speech difficulty, weakness and headaches. Negative for dizziness.       Difficulty recalling words, difficulty with grip, slurred speech    Per HPI unless specifically indicated above     Objective:    BP 134/80   Pulse 61   Temp 98.4 F (36.9 C) (Oral)   Wt 136 lb 6.4 oz (61.9 kg)    LMP  (LMP Unknown)   BMI 23.41 kg/m   Wt Readings from Last 3 Encounters:  11/18/20 136 lb 6.4 oz (61.9 kg)  06/12/20 135 lb (61.2 kg)  05/27/20 134 lb 14.7 oz (61.2 kg)    Physical Exam Vitals and nursing note reviewed.  Constitutional:      General: She is not in acute distress.    Appearance: Normal appearance. She is normal weight. She is not ill-appearing, toxic-appearing or diaphoretic.  HENT:     Head: Normocephalic.     Right Ear: External ear normal.     Left Ear: External ear normal.     Nose: Nose normal.     Mouth/Throat:     Mouth: Mucous membranes are moist.     Pharynx: Oropharynx is clear.  Eyes:     General:        Right eye: No discharge.        Left eye: No discharge.     Extraocular Movements: Extraocular movements intact.     Conjunctiva/sclera: Conjunctivae normal.     Pupils: Pupils are equal, round, and reactive to light.  Cardiovascular:     Rate and Rhythm: Normal rate and regular rhythm.     Heart sounds: No murmur heard.   Pulmonary:     Effort: Pulmonary effort is normal. No respiratory  distress.     Breath sounds: Normal breath sounds. No wheezing or rales.  Musculoskeletal:     Cervical back: Normal range of motion and neck supple.  Skin:    General: Skin is warm and dry.     Capillary Refill: Capillary refill takes less than 2 seconds.  Neurological:     General: No focal deficit present.     Mental Status: She is alert and oriented to person, place, and time. Mental status is at baseline.     Cranial Nerves: Cranial nerves are intact.     Motor: No weakness or tremor.     Coordination: Coordination is intact.     Gait: Gait is intact.  Psychiatric:        Mood and Affect: Mood normal.        Behavior: Behavior normal.        Thought Content: Thought content normal.        Judgment: Judgment normal.     Results for orders placed or performed in visit on 06/12/20  Comprehensive metabolic panel  Result Value Ref Range    Glucose 72 65 - 99 mg/dL   BUN 13 8 - 27 mg/dL   Creatinine, Ser 0.75 0.57 - 1.00 mg/dL   GFR calc non Af Amer 87 >59 mL/min/1.73   GFR calc Af Amer 100 >59 mL/min/1.73   BUN/Creatinine Ratio 17 12 - 28   Sodium 139 134 - 144 mmol/L   Potassium 3.8 3.5 - 5.2 mmol/L   Chloride 101 96 - 106 mmol/L   CO2 25 20 - 29 mmol/L   Calcium 9.5 8.7 - 10.3 mg/dL   Total Protein 7.2 6.0 - 8.5 g/dL   Albumin 4.2 3.8 - 4.9 g/dL   Globulin, Total 3.0 1.5 - 4.5 g/dL   Albumin/Globulin Ratio 1.4 1.2 - 2.2   Bilirubin Total 0.3 0.0 - 1.2 mg/dL   Alkaline Phosphatase 144 (H) 44 - 121 IU/L   AST 68 (H) 0 - 40 IU/L   ALT 70 (H) 0 - 32 IU/L      Assessment & Plan:   Problem List Items Addressed This Visit      Other   Slurred speech - Primary    Discussed with patient that she may have had a stroke or TIA. Will obtain MRI of the brain. Placed urgent referral to Neurology for further evaluation.  CMP and CBC ordered today.  Follow up after labs and imaging are complete.       Relevant Orders   Comprehensive metabolic panel   Ambulatory referral to Neurology   MR Brain W Wo Contrast   CBC w/Diff       Follow up plan: Return if symptoms worsen or fail to improve.

## 2020-11-19 LAB — COMPREHENSIVE METABOLIC PANEL
ALT: 107 IU/L — ABNORMAL HIGH (ref 0–32)
AST: 103 IU/L — ABNORMAL HIGH (ref 0–40)
Albumin/Globulin Ratio: 1.4 (ref 1.2–2.2)
Albumin: 4.2 g/dL (ref 3.8–4.9)
Alkaline Phosphatase: 149 IU/L — ABNORMAL HIGH (ref 44–121)
BUN/Creatinine Ratio: 16 (ref 12–28)
BUN: 13 mg/dL (ref 8–27)
Bilirubin Total: 0.3 mg/dL (ref 0.0–1.2)
CO2: 25 mmol/L (ref 20–29)
Calcium: 9.2 mg/dL (ref 8.7–10.3)
Chloride: 100 mmol/L (ref 96–106)
Creatinine, Ser: 0.79 mg/dL (ref 0.57–1.00)
Globulin, Total: 3 g/dL (ref 1.5–4.5)
Glucose: 102 mg/dL — ABNORMAL HIGH (ref 65–99)
Potassium: 3.7 mmol/L (ref 3.5–5.2)
Sodium: 140 mmol/L (ref 134–144)
Total Protein: 7.2 g/dL (ref 6.0–8.5)
eGFR: 86 mL/min/{1.73_m2} (ref >59)

## 2020-11-19 LAB — CBC WITH DIFFERENTIAL/PLATELET
Basophils Absolute: 0 10*3/uL (ref 0.0–0.2)
Basos: 1 %
EOS (ABSOLUTE): 0.1 10*3/uL (ref 0.0–0.4)
Eos: 2 %
Hematocrit: 44 % (ref 34.0–46.6)
Hemoglobin: 15 g/dL (ref 11.1–15.9)
Immature Grans (Abs): 0 10*3/uL (ref 0.0–0.1)
Immature Granulocytes: 0 %
Lymphocytes Absolute: 1.6 10*3/uL (ref 0.7–3.1)
Lymphs: 31 %
MCH: 33.8 pg — ABNORMAL HIGH (ref 26.6–33.0)
MCHC: 34.1 g/dL (ref 31.5–35.7)
MCV: 99 fL — ABNORMAL HIGH (ref 79–97)
Monocytes Absolute: 0.6 10*3/uL (ref 0.1–0.9)
Monocytes: 12 %
Neutrophils Absolute: 2.7 10*3/uL (ref 1.4–7.0)
Neutrophils: 54 %
Platelets: 163 10*3/uL (ref 150–450)
RBC: 4.44 x10E6/uL (ref 3.77–5.28)
RDW: 12.4 % (ref 11.7–15.4)
WBC: 5 10*3/uL (ref 3.4–10.8)

## 2020-11-19 NOTE — Addendum Note (Signed)
Addended by: Jon Billings on: 11/19/2020 08:37 AM   Modules accepted: Orders

## 2020-11-19 NOTE — Progress Notes (Signed)
Pleaes let patient know that her kidneys and electrolytes look good.  Her Liver enzymes are elevated.  I would like to order an Ultrasound of her liver to evaluate this further.  Complete blood count looks stable and consistent with prior.  Recommend obtaining the brain MRI and seeing neurology.  Please let me know if she has any questions.

## 2020-11-20 ENCOUNTER — Other Ambulatory Visit: Payer: Self-pay | Admitting: Family Medicine

## 2020-11-20 NOTE — Telephone Encounter (Signed)
Requested Prescriptions  Pending Prescriptions Disp Refills  . fluticasone-salmeterol (ADVAIR) 250-50 MCG/ACT AEPB [Pharmacy Med Name: FLUTICASONE-SALMETEROL 250-50 MCG/D] 60 each     Sig: INHALE 1 PUFF INTO THE LUNGS 2 TIMES DAILY     Pulmonology:  Combination Products Passed - 11/20/2020  8:51 AM      Passed - Valid encounter within last 12 months    Recent Outpatient Visits          2 days ago Slurred speech   [facility] Jon Billings, NP   5 months ago Elevated liver enzymes   [facility] Kathrine Haddock, NP   7 months ago Moderate COPD (chronic obstructive pulmonary disease) ([place])   [facility] Eulogio Bear, NP   1 year ago Moderate COPD (chronic obstructive pulmonary disease) ([place])   [place], Rachel Elizabeth, [place]   1 year ago Bipolar 1 disorder [facility])   [facility] Volney American, [place]

## 2020-12-03 ENCOUNTER — Other Ambulatory Visit: Payer: Self-pay

## 2020-12-03 ENCOUNTER — Ambulatory Visit
Admission: RE | Admit: 2020-12-03 | Discharge: 2020-12-03 | Disposition: A | Discharge status: Home / Self Care | Payer: PRIVATE HEALTH INSURANCE | Source: Ambulatory Visit | Attending: Nurse Practitioner | Admitting: Nurse Practitioner

## 2020-12-03 DIAGNOSIS — R4781 Slurred speech: Secondary | ICD-10-CM | POA: Insufficient documentation

## 2020-12-03 MED ORDER — GADOBUTROL 1 MMOL/ML IV SOLN
6.0000 mL | Freq: Once | INTRAVENOUS | Status: AC | PRN
  Administered 2020-12-03: 6 mL via INTRAVENOUS

## 2020-12-07 NOTE — Progress Notes (Signed)
Please let patient know that her MRI of the head was normal.  Due to the symptoms she is experiencing and the MRI being negative I recommend that patient see the Neurologist as discussed during visit.  Please make sure patient has an appointment.

## 2020-12-18 ENCOUNTER — Telehealth: Payer: Self-pay

## 2020-12-18 ENCOUNTER — Other Ambulatory Visit: Payer: Self-pay | Admitting: Nurse Practitioner

## 2020-12-18 MED ORDER — AMLODIPINE BESYLATE 5 MG PO TABS: 5.0000 mg | ORAL_TABLET | Freq: Every day | ORAL | 1 refills | Status: DC

## 2020-12-18 NOTE — Telephone Encounter (Signed)
Incoming fax from pharmacy wanting a prescription for Amlodipine 5mg 

## 2020-12-18 NOTE — Telephone Encounter (Signed)
Medication sent to the pharmacy.

## 2020-12-24 ENCOUNTER — Other Ambulatory Visit: Payer: Self-pay

## 2020-12-24 ENCOUNTER — Ambulatory Visit
Admission: RE | Admit: 2020-12-24 | Discharge: 2020-12-24 | Disposition: A | Discharge status: Home / Self Care | Payer: No Typology Code available for payment source | Source: Ambulatory Visit | Attending: Nurse Practitioner | Admitting: Nurse Practitioner

## 2020-12-24 DIAGNOSIS — R748 Abnormal levels of other serum enzymes: Secondary | ICD-10-CM | POA: Diagnosis present

## 2020-12-25 ENCOUNTER — Ambulatory Visit: Payer: Self-pay | Admitting: *Deleted

## 2020-12-25 ENCOUNTER — Other Ambulatory Visit: Payer: Self-pay | Admitting: Nurse Practitioner

## 2020-12-25 DIAGNOSIS — K769 Liver disease, unspecified: Secondary | ICD-10-CM

## 2020-12-25 NOTE — Telephone Encounter (Signed)
Cheryl with [facility] called in regarding the U/S abd RUQ Liver/GB result.    I called into the office and spoke with [place].   I let her know the U/S result had been called in from [facility] and was in [place]. She asked me to send the message to the office which I did.

## 2020-12-25 NOTE — Progress Notes (Signed)
Hepatic lesions noted on RUQ ultrasound. Will follow-up with hepatic protocol MRI for further evaluation.

## 2020-12-25 NOTE — Telephone Encounter (Signed)
Pt has apt on 01/01/2021 per Ander Purpura

## 2021-01-01 ENCOUNTER — Other Ambulatory Visit: Payer: Self-pay

## 2021-01-01 ENCOUNTER — Encounter: Payer: Self-pay | Admitting: Nurse Practitioner

## 2021-01-01 ENCOUNTER — Ambulatory Visit: Payer: No Typology Code available for payment source | Admitting: Nurse Practitioner

## 2021-01-01 VITALS — BP 159/74 | HR 89 | Temp 98.0°F | Wt 131.2 lb

## 2021-01-01 DIAGNOSIS — M545 Low back pain, unspecified: Secondary | ICD-10-CM | POA: Diagnosis not present

## 2021-01-01 MED ORDER — METHOCARBAMOL 500 MG PO TABS: 500.0000 mg | ORAL_TABLET | Freq: Four times a day (QID) | ORAL | 0 refills | Status: DC

## 2021-01-01 NOTE — Progress Notes (Signed)
BP (!) 159/74   Pulse 89   Temp 98 F (36.7 C)   Wt 131 lb 4 oz (59.5 kg)   LMP  (LMP Unknown)   SpO2 95%   BMI 22.53 kg/m    Subjective:    Patient ID: Monique Tucker, female    DOB: 06-Apr-1961, 60 y.o.   MRN: [id number]  HPI: Monique Tucker is a 60 y.o. female  Chief Complaint  Patient presents with   Back Pain    Right side, difficult standing and bending    Patient states she stopped drinking a month ago and has cut her smoking in half since her last visit. She was given Wellbutrin and Topamax by her psychiatrist to help with cravings and weight loss. She is now only taking the Topamax which is helping with her cravings and weight loss.   Patient is having some back pain.  States it as been going on for awhile.  Hurts more when she is mopping or vacuuming.  Patient states it feels like a pulled muscle. Denies radiating down her leg. No loss of bowel or bladder habits.   Relevant past medical, surgical, family and social history reviewed and updated as indicated. Interim medical history since our last visit reviewed. Allergies and medications reviewed and updated.  Review of Systems  Musculoskeletal:  Positive for back pain.   Per HPI unless specifically indicated above     Objective:    BP (!) 159/74   Pulse 89   Temp 98 F (36.7 C)   Wt 131 lb 4 oz (59.5 kg)   LMP  (LMP Unknown)   SpO2 95%   BMI 22.53 kg/m   Wt Readings from Last 3 Encounters:  01/01/21 131 lb 4 oz (59.5 kg)  11/18/20 136 lb 6.4 oz (61.9 kg)  06/12/20 135 lb (61.2 kg)    Physical Exam Vitals and nursing note reviewed.  Constitutional:      General: She is not in acute distress.    Appearance: Normal appearance. She is normal weight. She is not ill-appearing, toxic-appearing or diaphoretic.  HENT:     Head: Normocephalic.     Right Ear: External ear normal.     Left Ear: External ear normal.     Nose: Nose normal.     Mouth/Throat:     Mouth: Mucous membranes are moist.      Pharynx: Oropharynx is clear.  Eyes:     General:        Right eye: No discharge.        Left eye: No discharge.     Extraocular Movements: Extraocular movements intact.     Conjunctiva/sclera: Conjunctivae normal.     Pupils: Pupils are equal, round, and reactive to light.  Cardiovascular:     Rate and Rhythm: Normal rate and regular rhythm.     Heart sounds: No murmur heard. Pulmonary:     Effort: Pulmonary effort is normal. No respiratory distress.     Breath sounds: Normal breath sounds. No wheezing or rales.  Musculoskeletal:     Cervical back: Normal range of motion and neck supple.     Lumbar back: Normal.  Skin:    General: Skin is warm and dry.     Capillary Refill: Capillary refill takes less than 2 seconds.  Neurological:     General: No focal deficit present.     Mental Status: She is alert and oriented to person, place, and time. Mental status is at baseline.  Psychiatric:        Mood and Affect: Mood normal.        Behavior: Behavior normal.        Thought Content: Thought content normal.        Judgment: Judgment normal.    Results for orders placed or performed in visit on 11/18/20  Comprehensive metabolic panel  Result Value Ref Range   Glucose 102 (H) 65 - 99 mg/dL   BUN 13 8 - 27 mg/dL   Creatinine, Ser 0.79 0.57 - 1.00 mg/dL   eGFR 86 >59 mL/min/1.73   BUN/Creatinine Ratio 16 12 - 28   Sodium 140 134 - 144 mmol/L   Potassium 3.7 3.5 - 5.2 mmol/L   Chloride 100 96 - 106 mmol/L   CO2 25 20 - 29 mmol/L   Calcium 9.2 8.7 - 10.3 mg/dL   Total Protein 7.2 6.0 - 8.5 g/dL   Albumin 4.2 3.8 - 4.9 g/dL   Globulin, Total 3.0 1.5 - 4.5 g/dL   Albumin/Globulin Ratio 1.4 1.2 - 2.2   Bilirubin Total 0.3 0.0 - 1.2 mg/dL   Alkaline Phosphatase 149 (H) 44 - 121 IU/L   AST 103 (H) 0 - 40 IU/L   ALT 107 (H) 0 - 32 IU/L  CBC w/Diff  Result Value Ref Range   WBC 5.0 3.4 - 10.8 x10E3/uL   RBC 4.44 3.77 - 5.28 x10E6/uL   Hemoglobin 15.0 11.1 - 15.9 g/dL    Hematocrit 44.0 34.0 - 46.6 %   MCV 99 (H) 79 - 97 fL   MCH 33.8 (H) 26.6 - 33.0 pg   MCHC 34.1 31.5 - 35.7 g/dL   RDW 12.4 11.7 - 15.4 %   Platelets 163 150 - 450 x10E3/uL   Neutrophils 54 Not Estab. %   Lymphs 31 Not Estab. %   Monocytes 12 Not Estab. %   Eos 2 Not Estab. %   Basos 1 Not Estab. %   Neutrophils Absolute 2.7 1.4 - 7.0 x10E3/uL   Lymphocytes Absolute 1.6 0.7 - 3.1 x10E3/uL   Monocytes Absolute 0.6 0.1 - 0.9 x10E3/uL   EOS (ABSOLUTE) 0.1 0.0 - 0.4 x10E3/uL   Basophils Absolute 0.0 0.0 - 0.2 x10E3/uL   Immature Granulocytes 0 Not Estab. %   Immature Grans (Abs) 0.0 0.0 - 0.1 x10E3/uL      Assessment & Plan:   Problem List Items Addressed This Visit   None Visit Diagnoses     Acute right-sided low back pain without sciatica    -  Primary   Robaxin given during visit. Stretches given to help with back pain. Follow up if symptoms worsen or fail to improve.    Relevant Medications   methocarbamol (ROBAXIN) 500 MG tablet        Follow up plan: Return if symptoms worsen or fail to improve.

## 2021-01-06 ENCOUNTER — Ambulatory Visit: Payer: No Typology Code available for payment source

## 2021-01-18 ENCOUNTER — Other Ambulatory Visit: Payer: Self-pay | Admitting: Nurse Practitioner

## 2021-01-18 NOTE — Telephone Encounter (Signed)
Requested medication (s) are due for refill today: yes  Requested medication (s) are on the active medication list: yes  Last refill:  01/01/2021  Future visit scheduled:  no  Notes to clinic:  This refill cannot be delegated   Requested Prescriptions  Pending Prescriptions Disp Refills   methocarbamol (ROBAXIN) 500 MG tablet [Pharmacy Med Name: METHOCARBAMOL 500 MG TAB] 30 tablet 0    Sig: TAKE 1 TABLET BY MOUTH 4 TIMES A DAY      Not Delegated - Analgesics:  Muscle Relaxants Failed - 01/18/2021  1:00 PM      Failed - This refill cannot be delegated      Passed - Valid encounter within last 6 months    Recent Outpatient Visits           2 weeks ago Acute right-sided low back pain without sciatica   [facility] Jon Billings, NP   2 months ago Slurred speech   [facility] Jon Billings, NP   7 months ago Elevated liver enzymes   [facility] Kathrine Haddock, NP   9 months ago Moderate COPD (chronic obstructive pulmonary disease) ([place])   [facility] Eulogio Bear, NP   1 year ago Moderate COPD (chronic obstructive pulmonary disease) [facility])   [facility] Volney American, [place]

## 2021-01-21 NOTE — Telephone Encounter (Signed)
Patient last seen 01/01/21 by Santiago Glad.

## 2021-02-03 ENCOUNTER — Other Ambulatory Visit: Payer: Self-pay | Admitting: Nurse Practitioner

## 2021-02-03 NOTE — Telephone Encounter (Signed)
Requested medication (s) are due for refill today:  yes   Requested medication (s) are on the active medication list: yes   Last refill:  01/01/2021  Future visit scheduled: no  Notes to clinic:  this refill cannot be delegated    Requested Prescriptions  Pending Prescriptions Disp Refills   methocarbamol (ROBAXIN) 500 MG tablet [Pharmacy Med Name: METHOCARBAMOL 500 MG TAB] 30 tablet 0    Sig: TAKE 1 TABLET BY MOUTH 4 TIMES A DAY      Not Delegated - Analgesics:  Muscle Relaxants Failed - 02/03/2021  2:52 PM      Failed - This refill cannot be delegated      Passed - Valid encounter within last 6 months    Recent Outpatient Visits           1 month ago Acute right-sided low back pain without sciatica   [facility] Jon Billings, NP   2 months ago Slurred speech   [facility] Jon Billings, NP   7 months ago Elevated liver enzymes   [facility] Kathrine Haddock, NP   9 months ago Moderate COPD (chronic obstructive pulmonary disease) ([place])   [place], Jessica A, NP   1 year ago Moderate COPD (chronic obstructive pulmonary disease) [facility])   [facility] Volney American, [place]

## 2021-02-11 ENCOUNTER — Other Ambulatory Visit: Payer: Self-pay

## 2021-02-11 DIAGNOSIS — Z1231 Encounter for screening mammogram for malignant neoplasm of breast: Secondary | ICD-10-CM

## 2021-03-10 ENCOUNTER — Other Ambulatory Visit: Payer: Self-pay | Admitting: Nurse Practitioner

## 2021-03-10 ENCOUNTER — Telehealth: Payer: Self-pay | Admitting: Nurse Practitioner

## 2021-03-10 NOTE — Telephone Encounter (Signed)
Jessi- can you help her get this scheduled?

## 2021-03-10 NOTE — Telephone Encounter (Signed)
Patient states she is going to call and get rescheduled for the MRI for liver as she has not had it completed. Patient states when they called to get her scheduled for her appointment for the MRI of liver she was on her way to [place] and was unable to get rescheduled. Would I need to reach out to [place] to follow up with patient. Please advise?

## 2021-03-10 NOTE — Telephone Encounter (Signed)
Can we please call patient and find out if she has had the MRI of the liver done?

## 2021-03-20 ENCOUNTER — Other Ambulatory Visit: Payer: Self-pay

## 2021-03-20 ENCOUNTER — Ambulatory Visit
Admission: RE | Admit: 2021-03-20 | Discharge: 2021-03-20 | Disposition: A | Discharge status: Home / Self Care | Payer: PRIVATE HEALTH INSURANCE | Source: Ambulatory Visit | Attending: Nurse Practitioner | Admitting: Nurse Practitioner

## 2021-03-20 DIAGNOSIS — K769 Liver disease, unspecified: Secondary | ICD-10-CM | POA: Diagnosis present

## 2021-03-20 MED ORDER — GADOBUTROL 1 MMOL/ML IV SOLN
6.0000 mL | Freq: Once | INTRAVENOUS | Status: AC | PRN
  Administered 2021-03-20: 7.5 mL via INTRAVENOUS

## 2021-03-22 ENCOUNTER — Other Ambulatory Visit: Payer: Self-pay | Admitting: Nurse Practitioner

## 2021-03-22 DIAGNOSIS — R932 Abnormal findings on diagnostic imaging of liver and biliary tract: Secondary | ICD-10-CM

## 2021-03-22 NOTE — Progress Notes (Signed)
Results discussed with patient over the phone. Order placed for patient to see GI for biopsy.

## 2021-04-02 ENCOUNTER — Other Ambulatory Visit: Payer: Self-pay | Admitting: Nurse Practitioner

## 2021-04-02 NOTE — Telephone Encounter (Signed)
Requested medications are due for refill today yes  Requested medications are on the active medication list yes  Last refill 02/03/21  Last visit 03/2021  Future visit scheduled no  Notes to clinic Unsure what visit addressed this med, no upcoming visit scheduled, please assess.

## 2021-04-05 ENCOUNTER — Telehealth: Payer: Self-pay | Admitting: Nurse Practitioner

## 2021-04-05 NOTE — Telephone Encounter (Signed)
Copied from [place] [id number]. Topic: General - Inquiry >> Apr 02, 2021  2:47 PM Oneta Rack wrote: Osvaldo Human name: Judson Roch  Relation to pt: Case Manager from [facility]  Call back number: [phone] option [phone] fax # [phone]    Reason for call:  Requesting most recent MRI results please fax.

## 2021-04-20 ENCOUNTER — Other Ambulatory Visit: Payer: Self-pay | Admitting: Nurse Practitioner

## 2021-04-20 NOTE — Telephone Encounter (Signed)
Patient will need an office visit for further refills. Requested Prescriptions  Pending Prescriptions Disp Refills  . hydrochlorothiazide (HYDRODIURIL) 25 MG tablet [Pharmacy Med Name: HYDROCHLOROTHIAZIDE 25 MG TAB] 30 tablet 0    Sig: TAKE 1 TABLET BY MOUTH ONCE A DAY     Cardiovascular: Diuretics - Thiazide Failed - 04/20/2021  2:20 PM      Failed - Last BP in normal range    BP Readings from Last 1 Encounters:  01/01/21 (!) 159/74         Passed - Ca in normal range and within 360 days    Calcium  Date Value Ref Range Status  11/18/2020 9.2 8.7 - 10.3 mg/dL Final         Passed - Cr in normal range and within 360 days    Creatinine, Ser  Date Value Ref Range Status  11/18/2020 0.79 0.57 - 1.00 mg/dL Final         Passed - K in normal range and within 360 days    Potassium  Date Value Ref Range Status  11/18/2020 3.7 3.5 - 5.2 mmol/L Final         Passed - Na in normal range and within 360 days    Sodium  Date Value Ref Range Status  11/18/2020 140 134 - 144 mmol/L Final         Passed - Valid encounter within last 6 months    Recent Outpatient Visits          3 months ago Acute right-sided low back pain without sciatica   [facility] Jon Billings, NP   5 months ago Slurred speech   [facility] Jon Billings, NP   10 months ago Elevated liver enzymes   [facility] Kathrine Haddock, NP   12 months ago Moderate COPD (chronic obstructive pulmonary disease) ([place])   [facility] Eulogio Bear, NP   1 year ago Moderate COPD (chronic obstructive pulmonary disease) [facility])   [facility] Volney American, [place]

## 2021-04-23 ENCOUNTER — Other Ambulatory Visit: Payer: Self-pay | Admitting: Nurse Practitioner

## 2021-04-23 NOTE — Telephone Encounter (Signed)
Copied from [place] [id number]. Topic: Quick Communication - Rx Refill/Question >> Apr 23, 2021  2:35 PM Leward Quan A wrote: Medication: QUEtiapine Fumarate (SEROQUEL XR) 150 MG 24 hr tablet  Per patient she only have 2 tabs left  Has the patient contacted their pharmacy? No. Controlled substance (Agent: If no, request that the patient contact the pharmacy for the refill. If patient does not wish to contact the pharmacy document the reason why and proceed with request.) (Agent: If yes, when and what did the pharmacy advise?)  Preferred Pharmacy (with phone number or street name): [facility] [place] [facility] [place]  Phone:  [phone] Fax:  [phone]    Has the patient been seen for an appointment in the last year OR does the patient have an upcoming appointment? Yes.    Agent: Please be advised that RX refills may take up to 3 business days. We ask that you follow-up with your pharmacy.

## 2021-04-24 NOTE — Telephone Encounter (Signed)
Requested medication (s) are due for refill today  yes  Requested medication (s) are on the active medication list: yes    Last refill: 06/12/20  #90  0 refills  Future visit scheduled yes 04/30/21  Notes to clinic: not delegated  Requested Prescriptions  Pending Prescriptions Disp Refills   QUEtiapine Fumarate (SEROQUEL XR) 150 MG 24 hr tablet 90 tablet 0    Sig: Take 1 tablet (150 mg total) by mouth at bedtime.     Not Delegated - Psychiatry:  Antipsychotics - Second Generation (Atypical) - quetiapine Failed - 04/23/2021  2:48 PM      Failed - This refill cannot be delegated      Failed - ALT in normal range and within 180 days    ALT  Date Value Ref Range Status  11/18/2020 107 (H) 0 - 32 IU/L Final          Failed - AST in normal range and within 180 days    AST  Date Value Ref Range Status  11/18/2020 103 (H) 0 - 40 IU/L Final          Failed - Last BP in normal range    BP Readings from Last 1 Encounters:  01/01/21 (!) 159/74          Passed - Completed PHQ-2 or PHQ-9 in the last 360 days      Passed - Valid encounter within last 6 months    Recent Outpatient Visits           3 months ago Acute right-sided low back pain without sciatica   [facility] Jon Billings, NP   5 months ago Slurred speech   [facility] Jon Billings, NP   10 months ago Elevated liver enzymes   [facility] Kathrine Haddock, NP   1 year ago Moderate COPD (chronic obstructive pulmonary disease) ([place])   [place], Jessica A, NP   1 year ago Moderate COPD (chronic obstructive pulmonary disease) ([place])   [place], Rachel Elizabeth, PA-C       Future Appointments             In 6 days Jon Billings, NP [facility] [place]

## 2021-04-29 NOTE — Progress Notes (Signed)
BP (!) 159/64   Pulse (!) 59   Temp 97.7 F (36.5 C) (Oral)   Ht $R'5\' 4"'RL$  (1.626 m)   Wt 132 lb (59.9 kg)   LMP  (LMP Unknown)   SpO2 97%   BMI 22.66 kg/m    Subjective:    Patient ID: Monique Tucker, female    DOB: May 28, 1961, 60 y.o.   MRN: [id number]  HPI: Monique Tucker is a 60 y.o. female  Chief Complaint  Patient presents with   Anxiety   Hypertension    HYPERTENSION / HYPERLIPIDEMIA Satisfied with current treatment? yes Duration of hypertension: years BP monitoring frequency: not checking BP range:  BP medication side effects: no Past BP meds:  Metoprolol, amlodipine, and HCTZ Duration of hyperlipidemia: years Cholesterol medication side effects: no Cholesterol supplements: none Past cholesterol medications: none Medication compliance: good compliance Aspirin: no Recent stressors: no Recurrent headaches: no Visual changes: no Palpitations: no Dyspnea: no Chest pain: no Lower extremity edema: no Dizzy/lightheaded: no  MOOD Followed by Psychiatry.  She has been out of her Seroquel recently.  She is stressed about finances and her knee pain.  COPD COPD status: controlled Satisfied with current treatment?: no Oxygen use: no Dyspnea frequency:  Cough frequency: when she smokes and doesn't use her inhaler Rescue inhaler frequency:   Limitation of activity: no Productive cough:  Last Spirometry:  Pneumovax: Up to Date Influenza: Up to Date  Relevant past medical, surgical, family and social history reviewed and updated as indicated. Interim medical history since our last visit reviewed. Allergies and medications reviewed and updated.  Review of Systems  Eyes:  Negative for visual disturbance.  Respiratory:  Positive for shortness of breath. Negative for cough and chest tightness.   Cardiovascular:  Negative for chest pain, palpitations and leg swelling.  Neurological:  Negative for dizziness and headaches.  Psychiatric/Behavioral:  The patient is  nervous/anxious.    Per HPI unless specifically indicated above     Objective:    BP (!) 159/64   Pulse (!) 59   Temp 97.7 F (36.5 C) (Oral)   Ht $R'5\' 4"'tQ$  (1.626 m)   Wt 132 lb (59.9 kg)   LMP  (LMP Unknown)   SpO2 97%   BMI 22.66 kg/m   Wt Readings from Last 3 Encounters:  04/30/21 132 lb (59.9 kg)  01/01/21 131 lb 4 oz (59.5 kg)  11/18/20 136 lb 6.4 oz (61.9 kg)    Physical Exam Vitals and nursing note reviewed.  Constitutional:      General: She is not in acute distress.    Appearance: Normal appearance. She is normal weight. She is not ill-appearing, toxic-appearing or diaphoretic.  HENT:     Head: Normocephalic.     Right Ear: External ear normal.     Left Ear: External ear normal.     Nose: Nose normal.     Mouth/Throat:     Mouth: Mucous membranes are moist.     Pharynx: Oropharynx is clear.  Eyes:     General:        Right eye: No discharge.        Left eye: No discharge.     Extraocular Movements: Extraocular movements intact.     Conjunctiva/sclera: Conjunctivae normal.     Pupils: Pupils are equal, round, and reactive to light.  Cardiovascular:     Rate and Rhythm: Normal rate and regular rhythm.     Heart sounds: No murmur heard. Pulmonary:  Effort: Pulmonary effort is normal. No respiratory distress.     Breath sounds: Normal breath sounds. No wheezing or rales.  Musculoskeletal:     Cervical back: Normal range of motion and neck supple.  Skin:    General: Skin is warm and dry.     Capillary Refill: Capillary refill takes less than 2 seconds.  Neurological:     General: No focal deficit present.     Mental Status: She is alert and oriented to person, place, and time. Mental status is at baseline.  Psychiatric:        Mood and Affect: Mood normal.        Behavior: Behavior normal.        Thought Content: Thought content normal.        Judgment: Judgment normal.    Results for orders placed or performed in visit on 11/18/20  Comprehensive  metabolic panel  Result Value Ref Range   Glucose 102 (H) 65 - 99 mg/dL   BUN 13 8 - 27 mg/dL   Creatinine, Ser 2.68 0.57 - 1.00 mg/dL   eGFR 86 >06 RQ/TMZ/2.43   BUN/Creatinine Ratio 16 12 - 28   Sodium 140 134 - 144 mmol/L   Potassium 3.7 3.5 - 5.2 mmol/L   Chloride 100 96 - 106 mmol/L   CO2 25 20 - 29 mmol/L   Calcium 9.2 8.7 - 10.3 mg/dL   Total Protein 7.2 6.0 - 8.5 g/dL   Albumin 4.2 3.8 - 4.9 g/dL   Globulin, Total 3.0 1.5 - 4.5 g/dL   Albumin/Globulin Ratio 1.4 1.2 - 2.2   Bilirubin Total 0.3 0.0 - 1.2 mg/dL   Alkaline Phosphatase 149 (H) 44 - 121 IU/L   AST 103 (H) 0 - 40 IU/L   ALT 107 (H) 0 - 32 IU/L  CBC w/Diff  Result Value Ref Range   WBC 5.0 3.4 - 10.8 x10E3/uL   RBC 4.44 3.77 - 5.28 x10E6/uL   Hemoglobin 15.0 11.1 - 15.9 g/dL   Hematocrit 52.5 51.8 - 46.6 %   MCV 99 (H) 79 - 97 fL   MCH 33.8 (H) 26.6 - 33.0 pg   MCHC 34.1 31.5 - 35.7 g/dL   RDW 60.2 62.6 - 92.3 %   Platelets 163 150 - 450 x10E3/uL   Neutrophils 54 Not Estab. %   Lymphs 31 Not Estab. %   Monocytes 12 Not Estab. %   Eos 2 Not Estab. %   Basos 1 Not Estab. %   Neutrophils Absolute 2.7 1.4 - 7.0 x10E3/uL   Lymphocytes Absolute 1.6 0.7 - 3.1 x10E3/uL   Monocytes Absolute 0.6 0.1 - 0.9 x10E3/uL   EOS (ABSOLUTE) 0.1 0.0 - 0.4 x10E3/uL   Basophils Absolute 0.0 0.0 - 0.2 x10E3/uL   Immature Granulocytes 0 Not Estab. %   Immature Grans (Abs) 0.0 0.0 - 0.1 x10E3/uL      Assessment & Plan:   Problem List Items Addressed This Visit       Cardiovascular and Mediastinum   Hypertension    Chronic. Not well controlled. Patient has not been taking her Amlodipine, Hydrochlorothiazide, or Metoprolol.  Will refill medications for patient. Follow up in 1 month for reevaluation.      Relevant Medications   amLODipine (NORVASC) 2.5 MG tablet   amLODipine (NORVASC) 5 MG tablet   hydrochlorothiazide (HYDRODIURIL) 25 MG tablet   metoprolol succinate (TOPROL-XL) 50 MG 24 hr tablet   Other Relevant  Orders   Comp Met (CMET)  Respiratory   Moderate COPD (chronic obstructive pulmonary disease) ([place]) - Primary    Controlled when patient is consistent with Symbicort.  However, she often forgets. Discussed the imporance of being consistent and smoking cessation.      Relevant Medications   montelukast (SINGULAIR) 10 MG tablet     Other   Bipolar 1 disorder ([place])    Followed by Psychiatry.  Patient plans to have Seroquel prescribed by Psychiatrist also.  However, she has been out so I refilled it today.       Depression    Followed by Psychiatry.  Patient plans to have Seroquel prescribed by Psychiatrist also.  However, she has been out so I refilled it today.       Relevant Medications   buPROPion (WELLBUTRIN SR) 150 MG 12 hr tablet   Prediabetes    Labs ordered today. Will make recommendations based on lab results.       Relevant Orders   HgB A1c   Hypercholesterolemia    Chronic.  Controlled.  Labs ordered today.  Return to clinic in 6 months for reevaluation.  Call sooner if concerns arise.        Relevant Medications   amLODipine (NORVASC) 2.5 MG tablet   amLODipine (NORVASC) 5 MG tablet   hydrochlorothiazide (HYDRODIURIL) 25 MG tablet   metoprolol succinate (TOPROL-XL) 50 MG 24 hr tablet   Other Relevant Orders   Lipid Profile   Slurred speech    Patient decided not to see Neurology about her forgetfulness.       Other Visit Diagnoses     Need for influenza vaccination       Relevant Orders   Flu Vaccine QUAD 53mo+IM (Fluarix, Fluzone & Alfiuria Quad PF) (Completed)        Follow up plan: Return in about 1 month (around 05/30/2021) for BP Check.

## 2021-04-30 ENCOUNTER — Ambulatory Visit (INDEPENDENT_AMBULATORY_CARE_PROVIDER_SITE_OTHER): Payer: No Typology Code available for payment source | Admitting: Nurse Practitioner

## 2021-04-30 ENCOUNTER — Other Ambulatory Visit: Payer: Self-pay

## 2021-04-30 ENCOUNTER — Encounter: Payer: Self-pay | Admitting: Nurse Practitioner

## 2021-04-30 VITALS — BP 159/64 | HR 59 | Temp 97.7°F | Ht 64.0 in | Wt 132.0 lb

## 2021-04-30 DIAGNOSIS — I10 Essential (primary) hypertension: Secondary | ICD-10-CM

## 2021-04-30 DIAGNOSIS — R4781 Slurred speech: Secondary | ICD-10-CM

## 2021-04-30 DIAGNOSIS — R7303 Prediabetes: Secondary | ICD-10-CM

## 2021-04-30 DIAGNOSIS — F319 Bipolar disorder, unspecified: Secondary | ICD-10-CM

## 2021-04-30 DIAGNOSIS — J449 Chronic obstructive pulmonary disease, unspecified: Secondary | ICD-10-CM | POA: Diagnosis not present

## 2021-04-30 DIAGNOSIS — Z23 Encounter for immunization: Secondary | ICD-10-CM

## 2021-04-30 DIAGNOSIS — F3341 Major depressive disorder, recurrent, in partial remission: Secondary | ICD-10-CM

## 2021-04-30 DIAGNOSIS — E7800 Pure hypercholesterolemia, unspecified: Secondary | ICD-10-CM

## 2021-04-30 MED ORDER — AMLODIPINE BESYLATE 5 MG PO TABS: 5.0000 mg | ORAL_TABLET | Freq: Every day | ORAL | 1 refills | Status: DC

## 2021-04-30 MED ORDER — AMLODIPINE BESYLATE 2.5 MG PO TABS: 2.5000 mg | ORAL_TABLET | Freq: Every day | ORAL | 1 refills | Status: DC

## 2021-04-30 MED ORDER — GABAPENTIN 300 MG PO CAPS: 300.0000 mg | ORAL_CAPSULE | Freq: Three times a day (TID) | ORAL | 1 refills | Status: DC

## 2021-04-30 MED ORDER — HYDROCHLOROTHIAZIDE 25 MG PO TABS: 25.0000 mg | ORAL_TABLET | Freq: Every day | ORAL | 1 refills | Status: DC

## 2021-04-30 MED ORDER — METHOCARBAMOL 500 MG PO TABS: 500.0000 mg | ORAL_TABLET | Freq: Four times a day (QID) | ORAL | 1 refills | Status: DC

## 2021-04-30 MED ORDER — QUETIAPINE FUMARATE ER 200 MG PO TB24: 200.0000 mg | ORAL_TABLET | Freq: Every day | ORAL | 1 refills | Status: DC

## 2021-04-30 MED ORDER — MONTELUKAST SODIUM 10 MG PO TABS: 10.0000 mg | ORAL_TABLET | Freq: Every day | ORAL | 1 refills | Status: DC

## 2021-04-30 MED ORDER — METOPROLOL SUCCINATE ER 50 MG PO TB24: ORAL_TABLET | ORAL | 1 refills | Status: DC

## 2021-04-30 NOTE — Assessment & Plan Note (Signed)
Labs ordered today.  Will make recommendations based on lab results. ?

## 2021-04-30 NOTE — Assessment & Plan Note (Signed)
Followed by Psychiatry.  Patient plans to have Seroquel prescribed by Psychiatrist also.  However, she has been out so I refilled it today.

## 2021-04-30 NOTE — Assessment & Plan Note (Signed)
Patient decided not to see Neurology about her forgetfulness.

## 2021-04-30 NOTE — Assessment & Plan Note (Signed)
Controlled when patient is consistent with Symbicort.  However, she often forgets. Discussed the imporance of being consistent and smoking cessation.

## 2021-04-30 NOTE — Assessment & Plan Note (Signed)
Chronic.  Controlled.  Labs ordered today.  Return to clinic in 6 months for reevaluation.  Call sooner if concerns arise.

## 2021-04-30 NOTE — Assessment & Plan Note (Signed)
Chronic. Not well controlled. Patient has not been taking her Amlodipine, Hydrochlorothiazide, or Metoprolol.  Will refill medications for patient. Follow up in 1 month for reevaluation.

## 2021-05-01 LAB — COMPREHENSIVE METABOLIC PANEL
ALT: 43 IU/L — ABNORMAL HIGH (ref 0–32)
AST: 47 IU/L — ABNORMAL HIGH (ref 0–40)
Albumin/Globulin Ratio: 1.4 (ref 1.2–2.2)
Albumin: 3.9 g/dL (ref 3.8–4.8)
Alkaline Phosphatase: 132 IU/L — ABNORMAL HIGH (ref 44–121)
BUN/Creatinine Ratio: 20 (ref 12–28)
BUN: 13 mg/dL (ref 8–27)
Bilirubin Total: 0.2 mg/dL (ref 0.0–1.2)
CO2: 26 mmol/L (ref 20–29)
Calcium: 8.7 mg/dL (ref 8.7–10.3)
Chloride: 108 mmol/L — ABNORMAL HIGH (ref 96–106)
Creatinine, Ser: 0.64 mg/dL (ref 0.57–1.00)
Globulin, Total: 2.8 g/dL (ref 1.5–4.5)
Glucose: 81 mg/dL (ref 70–99)
Potassium: 4 mmol/L (ref 3.5–5.2)
Sodium: 143 mmol/L (ref 134–144)
Total Protein: 6.7 g/dL (ref 6.0–8.5)
eGFR: 100 mL/min/{1.73_m2} (ref >59)

## 2021-05-01 LAB — HEMOGLOBIN A1C
Est. average glucose Bld gHb Est-mCnc: 114 mg/dL
Hgb A1c MFr Bld: 5.6 % (ref 4.8–5.6)

## 2021-05-01 LAB — LIPID PANEL
Chol/HDL Ratio: 3.3 ratio (ref 0.0–4.4)
Cholesterol, Total: 131 mg/dL (ref 100–199)
HDL: 40 mg/dL (ref >39)
LDL Chol Calc (NIH): 70 mg/dL (ref 0–99)
Triglycerides: 117 mg/dL (ref 0–149)
VLDL Cholesterol Cal: 21 mg/dL (ref 5–40)

## 2021-05-03 NOTE — Progress Notes (Signed)
Hi Monique Tucker.  Overall your lab work looks good.  Your livery enzymes are elevated but improved from prior.  Continue to avoid tylneol and alcohol.  Please let me know if you have any questions.

## 2021-05-05 ENCOUNTER — Telehealth: Payer: Self-pay | Admitting: Nurse Practitioner

## 2021-05-05 NOTE — Telephone Encounter (Signed)
Called pt to discuss insurance request; pt requested to be called back after 3:00pm today.

## 2021-05-05 NOTE — Telephone Encounter (Signed)
Copied from [place] [id number]. Topic: General - Other >> May 04, 2021 12:06 PM Bayard Beaver wrote: Reason for CRM: sarah from [facility] called in to get patients most recent MRI results faxed. [phone] phone and fax [phone]

## 2021-05-24 ENCOUNTER — Other Ambulatory Visit: Payer: Self-pay

## 2021-05-24 ENCOUNTER — Encounter: Payer: Self-pay | Admitting: Gastroenterology

## 2021-05-24 ENCOUNTER — Other Ambulatory Visit: Payer: Self-pay | Admitting: Gastroenterology

## 2021-05-24 ENCOUNTER — Ambulatory Visit (INDEPENDENT_AMBULATORY_CARE_PROVIDER_SITE_OTHER): Payer: No Typology Code available for payment source | Admitting: Gastroenterology

## 2021-05-24 VITALS — BP 127/68 | HR 77 | Temp 99.0°F | Ht 64.0 in | Wt 125.8 lb

## 2021-05-24 DIAGNOSIS — K7469 Other cirrhosis of liver: Secondary | ICD-10-CM | POA: Diagnosis not present

## 2021-05-24 DIAGNOSIS — I8500 Esophageal varices without bleeding: Secondary | ICD-10-CM

## 2021-05-24 DIAGNOSIS — R160 Hepatomegaly, not elsewhere classified: Secondary | ICD-10-CM

## 2021-05-24 NOTE — Progress Notes (Signed)
Jonathon Bellows MD, MRCP(U.K) [address]  [place]  [place], [place] [address]  Main: [phone]  Fax: [phone]   Gastroenterology Consultation  Referring Provider:     Jon Billings, NP Primary Care Physician:  Jon Billings, NP Primary Gastroenterologist:  Dr. Jonathon Bellows  Reason for Consultation:   Abnormal imaging        HPI:   Monique Tucker is a 60 y.o. y/o female referred for consultation & management  by Jon Billings, NP.  Has been referred to see me for abdominal right upper quadrant ultrasound that was performed in June 2022 which showed features of cirrhosis and scattered hypodense hepatic lesions largest measuring 2.4 cm and MRI of the liver was recommended which were subsequently done on 03/22/2021 that showed 3 hyperenhancing lesions of the liver could suggest focal nodular hyperplasia in the setting of cirrhosis highly suspicious for multifocal hepatocellular carcinoma.  She has not been seen by GI in the past.  04/30/2021 LFTs are elevated with an alkaline phosphatase of 132 AST of 47 and ALT of 43.  In May 2022 hemoglobin was 15 g with a platelet count of 163.  No recent hepatitis C serology.  She says that she has never known that she has had liver cirrhosis.  She has had family members with liver cirrhosis.  She has had a history of increased alcohol intake on most days of the week for over 15 years.  She states that she has antibody for hepatitis C but has never been tested for hepatitis C.  Denies any incarceration, Armed forces logistics/support/administrative officer, illegal drug use.  She does have tattoos which she says were professionally performed.  She states that her daughter and granddaughter both have antibodies for hepatitis C.  She is a smoker.   Past Medical History:  Diagnosis Date   Acute hepatitis C    Anxiety    AR (allergic rhinitis)    Arthritis    Bulging lumbar disc    COPD (chronic obstructive pulmonary disease) (HCC)    mild   Depression     Hypertension    Plantar fasciitis    Restless leg syndrome, controlled    Sciatica    left    Past Surgical History:  Procedure Laterality Date   CATARACT EXTRACTION W/PHACO Left 05/13/2020   Procedure: CATARACT EXTRACTION PHACO AND INTRAOCULAR LENS PLACEMENT ([place]) LEFT;  Surgeon: Leandrew Koyanagi, MD;  Location: [facility];  Service: Ophthalmology;  Laterality: Left;  5.15 049.8 10.4%   CATARACT EXTRACTION W/PHACO Right 05/27/2020   Procedure: CATARACT EXTRACTION PHACO AND INTRAOCULAR LENS PLACEMENT ([place]) RIGHT;  Surgeon: Leandrew Koyanagi, MD;  Location: [facility];  Service: Ophthalmology;  Laterality: Right;  3.29 0:55.2 6.0%   CYSTOCELE REPAIR N/A 10/19/2015   Procedure: ANTERIOR REPAIR (CYSTOCELE);  Surgeon: Brayton Mars, MD;  Location: [facility];  Service: Gynecology;  Laterality: N/A;   RECTOCELE REPAIR N/A 05/02/2016   Procedure: POSTERIOR REPAIR (RECTOCELE);  Surgeon: Brayton Mars, MD;  Location: [facility];  Service: Gynecology;  Laterality: N/A;   TOTAL VAGINAL HYSTERECTOMY     TUBAL LIGATION     VAGINAL HYSTERECTOMY Bilateral 10/19/2015   Procedure: HYSTERECTOMY VAGINAL WITH BILATERAL SALPINGO-OOPHERECTOMY;  Surgeon: Brayton Mars, MD;  Location: [facility];  Service: Gynecology;  Laterality: Bilateral;    Prior to Admission medications   Medication Sig Start Date End Date Taking? Authorizing Provider  ALPRAZolam (XANAX) 0.25 MG tablet Take 1 tablet (0.25 mg total) by mouth at bedtime  as needed for anxiety. 04/21/20   Eulogio Bear, NP  amLODipine (NORVASC) 2.5 MG tablet Take 1 tablet (2.5 mg total) by mouth daily. To take in conjunction with 5 mg tablets 04/30/21   Jon Billings, NP  amLODipine (NORVASC) 5 MG tablet Take 1 tablet (5 mg total) by mouth daily. To take with 2.5 mg tablet 04/30/21   Jon Billings, NP  Aspirin-Acetaminophen-Caffeine (GOODYS EXTRA STRENGTH) [phone] MG PACK Take 1 packet by mouth daily.     [provider]  buPROPion (WELLBUTRIN SR) 150 MG 12 hr tablet Take 150 mg by mouth 2 (two) times daily. 03/22/21   [provider]  diclofenac Sodium (VOLTAREN) 1 % GEL Apply 2 g topically 4 (four) times daily. 04/21/20   Eulogio Bear, NP  EPINEPHrine 0.3 mg/0.3 mL IJ SOAJ injection INJECT 0.3 MLS INTO THE MUSCLE ONCE 11/18/20   Jon Billings, NP  estradiol (ESTRACE) 0.1 MG/GM vaginal cream Place 0.5 Applicatorfuls vaginally 2 (two) times a week. 11/19/20   Jon Billings, NP  fluticasone-salmeterol (ADVAIR) 250-50 MCG/ACT AEPB INHALE 1 PUFF INTO THE LUNGS 2 TIMES DAILY 02/03/21   Cannady, Henrine Screws T, NP  gabapentin (NEURONTIN) 300 MG capsule Take 1 capsule (300 mg total) by mouth 3 (three) times daily. 04/30/21   Jon Billings, NP  hydrochlorothiazide (HYDRODIURIL) 25 MG tablet Take 1 tablet (25 mg total) by mouth daily. 04/30/21   Jon Billings, NP  Magnesium 500 MG TABS Take 500 mg by mouth daily.    [provider]  methocarbamol (ROBAXIN) 500 MG tablet Take 1 tablet (500 mg total) by mouth 4 (four) times daily. 04/30/21   Jon Billings, NP  metoprolol succinate (TOPROL-XL) 50 MG 24 hr tablet Take with or immediately following a meal. 04/30/21   Jon Billings, NP  montelukast (SINGULAIR) 10 MG tablet Take 1 tablet (10 mg total) by mouth at bedtime. 04/30/21   Jon Billings, NP  QUEtiapine (SEROQUEL XR) 200 MG 24 hr tablet Take 1 tablet (200 mg total) by mouth at bedtime. 04/30/21   Jon Billings, NP  Tiotropium Bromide Monohydrate (SPIRIVA RESPIMAT) 2.5 MCG/ACT AERS INHALE 1 PUFF BY MOUTH INTO THE LUNGS DAILY 11/18/20   Jon Billings, NP  topiramate (TOPAMAX) 50 MG tablet Take 50 mg by mouth daily. 12/18/20   [provider]  venlafaxine XR (EFFEXOR-XR) 150 MG 24 hr capsule Take 1 capsule (150 mg total) by mouth daily with breakfast. 06/12/20   Kathrine Haddock, NP    Family History  Problem Relation Age of Onset   Breast cancer  Mother    Pancreatic cancer Mother    Cancer Mother        breast and pancreatic   Hypertension Mother    Migraines Mother    Diabetes Mother    Heart disease Father    Mental illness Sister        depression   Heart attack Sister    Diabetes Sister    Alcohol abuse Maternal Grandfather    Cancer Maternal Grandmother        lung and brain   Mental illness Sister        depression     Social History   Tobacco Use   Smoking status: Every Day    Packs/day: 1.00    Types: Cigarettes   Smokeless tobacco: Never   Tobacco comments:     started age 57  Vaping Use   Vaping Use: Former  Substance Use Topics   Alcohol use: Yes  Alcohol/week: 14.0 standard drinks    Types: 14 Cans of beer per week    Comment:     Drug use: No    Allergies as of 05/24/2021 - Review Complete 04/30/2021  Allergen Reaction Noted   Bee venom Anaphylaxis 10/14/2015   Other  05/25/2020   Lisinopril Anxiety 06/16/2016    Review of Systems:    All systems reviewed and negative except where noted in HPI.   Physical Exam:  LMP  (LMP Unknown)  No LMP recorded (lmp unknown). Patient has had a hysterectomy. Psych:  Alert and cooperative. Normal mood and affect. General:   Alert,  Well-developed, well-nourished, pleasant and cooperative in NAD Head:  Normocephalic and atraumatic. Eyes:  Sclera clear, no icterus.   Conjunctiva pink. Ears:  Normal auditory acuity. Neck:  Supple; no masses or thyromegaly. Lungs:  Respirations even and unlabored.  Clear throughout to auscultation.   No wheezes, crackles, or rhonchi. No acute distress. Heart:  Regular rate and rhythm; no murmurs, clicks, rubs, or gallops. Abdomen:  Normal bowel sounds.  No bruits.  Soft, non-tender and non-distended without masses, hepatosplenomegaly or hernias noted.  No guarding or rebound tenderness.    Neurologic:  Alert and oriented x3;  grossly normal neurologically. Psych:  Alert and cooperative. Normal mood and  affect.  Imaging Studies: No results found.  Assessment and Plan:   Monique Tucker is a 60 y.o. y/o female has been referred for abnormal liver imaging in terms of ultrasound which showed cirrhosis of the liver and concern for some lesions in the liver.  This was followed up by an MRI which showed features which could be focal nodular hyperplasia but also could be hepatocellular carcinoma.  This is particularly concerning as the patient has background liver cirrhosis although the albumin and platelet count are within normal limits.  No recent hepatitis serology transaminases are elevated.  Possible that liver cirrhosis is secondary to chronic alcohol use and hepatitis C.  Plan 1.  Check AFP, hepatitis A,B and C serologies, autoimmune panel 2.  Refer to oncology Dr. Tasia Catchings whom I have discussed with to decide about liver biopsy which is probably going to be the neck step to determine if the patient does have multifocal [place]. 3.  Requires an EGD to screen for esophageal varices. 4.  No alcohol 5.  Vaccination will be decided based on lab work performed today 6.  Advised to stop alcohol and smoking  I have discussed alternative options, risks & benefits,  which include, but are not limited to, bleeding, infection, perforation,respiratory complication & drug reaction.  The patient agrees with this plan & written consent will be obtained.     Follow up in 6 weeks  Dr Jonathon Bellows MD,MRCP(U.K)

## 2021-05-25 ENCOUNTER — Encounter: Payer: Self-pay | Admitting: Gastroenterology

## 2021-05-25 ENCOUNTER — Ambulatory Visit: Payer: PRIVATE HEALTH INSURANCE | Admitting: Certified Registered"

## 2021-05-25 ENCOUNTER — Encounter
Admission: RE | Disposition: A | Discharge status: Home / Self Care | Payer: Self-pay | Source: Home / Self Care | Attending: Gastroenterology

## 2021-05-25 ENCOUNTER — Ambulatory Visit
Admission: RE | Admit: 2021-05-25 | Discharge: 2021-05-25 | Disposition: A | Discharge status: Home / Self Care | Payer: PRIVATE HEALTH INSURANCE | Attending: Gastroenterology | Admitting: Gastroenterology

## 2021-05-25 DIAGNOSIS — G709 Myoneural disorder, unspecified: Secondary | ICD-10-CM | POA: Diagnosis not present

## 2021-05-25 DIAGNOSIS — I8500 Esophageal varices without bleeding: Secondary | ICD-10-CM | POA: Diagnosis not present

## 2021-05-25 DIAGNOSIS — F418 Other specified anxiety disorders: Secondary | ICD-10-CM | POA: Insufficient documentation

## 2021-05-25 DIAGNOSIS — F17210 Nicotine dependence, cigarettes, uncomplicated: Secondary | ICD-10-CM | POA: Insufficient documentation

## 2021-05-25 DIAGNOSIS — K759 Inflammatory liver disease, unspecified: Secondary | ICD-10-CM | POA: Insufficient documentation

## 2021-05-25 DIAGNOSIS — K7460 Unspecified cirrhosis of liver: Secondary | ICD-10-CM | POA: Diagnosis not present

## 2021-05-25 DIAGNOSIS — K319 Disease of stomach and duodenum, unspecified: Secondary | ICD-10-CM | POA: Diagnosis not present

## 2021-05-25 DIAGNOSIS — I10 Essential (primary) hypertension: Secondary | ICD-10-CM | POA: Insufficient documentation

## 2021-05-25 DIAGNOSIS — J449 Chronic obstructive pulmonary disease, unspecified: Secondary | ICD-10-CM | POA: Diagnosis not present

## 2021-05-25 DIAGNOSIS — F319 Bipolar disorder, unspecified: Secondary | ICD-10-CM | POA: Diagnosis not present

## 2021-05-25 DIAGNOSIS — K2970 Gastritis, unspecified, without bleeding: Secondary | ICD-10-CM | POA: Diagnosis not present

## 2021-05-25 HISTORY — PX: ESOPHAGOGASTRODUODENOSCOPY (EGD) WITH PROPOFOL: SHX5813

## 2021-05-25 LAB — AFP TUMOR MARKER: AFP, Serum, Tumor Marker: 300 ng/mL — ABNORMAL HIGH (ref 0.0–9.2)

## 2021-05-25 SURGERY — ESOPHAGOGASTRODUODENOSCOPY (EGD) WITH PROPOFOL
Anesthesia: General

## 2021-05-25 MED ORDER — PROPOFOL 500 MG/50ML IV EMUL
INTRAVENOUS | Status: DC | PRN
  Administered 2021-05-25: 120 ug/kg/min via INTRAVENOUS

## 2021-05-25 MED ORDER — GLYCOPYRROLATE 0.2 MG/ML IJ SOLN
INTRAMUSCULAR | Status: DC | PRN
  Administered 2021-05-25: .2 mg via INTRAVENOUS

## 2021-05-25 MED ORDER — SODIUM CHLORIDE 0.9 % IV SOLN: INTRAVENOUS | Status: DC

## 2021-05-25 MED ORDER — PROPOFOL 10 MG/ML IV BOLUS
INTRAVENOUS | Status: DC | PRN
  Administered 2021-05-25: 100 mg via INTRAVENOUS

## 2021-05-25 MED ORDER — LIDOCAINE HCL (CARDIAC) PF 100 MG/5ML IV SOSY
PREFILLED_SYRINGE | INTRAVENOUS | Status: DC | PRN
  Administered 2021-05-25: 80 mg via INTRAVENOUS

## 2021-05-25 MED ORDER — PROPOFOL 10 MG/ML IV BOLUS
INTRAVENOUS | Status: AC
  Filled 2021-05-25: qty 40

## 2021-05-25 NOTE — H&P (Signed)
Jonathon Bellows, MD [address] [place], [place], [place], [address] [address] [place], [place], [place], [address] Phone: [phone]  Fax: [phone]  Primary Care Physician:  Jon Billings, NP   Pre-Procedure History & Physical: HPI:  Monique Tucker is a 60 y.o. female is here for an endoscopy    Past Medical History:  Diagnosis Date   Acute hepatitis C    Anxiety    AR (allergic rhinitis)    Arthritis    Bulging lumbar disc    COPD (chronic obstructive pulmonary disease) ([place])    mild   Depression    Hypertension    Plantar fasciitis    Restless leg syndrome, controlled    Sciatica    left    Past Surgical History:  Procedure Laterality Date   BLADDER REPAIR     CATARACT EXTRACTION W/PHACO Left 05/13/2020   Procedure: CATARACT EXTRACTION PHACO AND INTRAOCULAR LENS PLACEMENT ([place]) LEFT;  Surgeon: Leandrew Koyanagi, MD;  Location: [facility];  Service: Ophthalmology;  Laterality: Left;  5.15 049.8 10.4%   CATARACT EXTRACTION W/PHACO Right 05/27/2020   Procedure: CATARACT EXTRACTION PHACO AND INTRAOCULAR LENS PLACEMENT ([place]) RIGHT;  Surgeon: Leandrew Koyanagi, MD;  Location: [facility];  Service: Ophthalmology;  Laterality: Right;  3.29 0:55.2 6.0%   CYSTOCELE REPAIR N/A 10/19/2015   Procedure: ANTERIOR REPAIR (CYSTOCELE);  Surgeon: Brayton Mars, MD;  Location: [facility];  Service: Gynecology;  Laterality: N/A;   RECTOCELE REPAIR N/A 05/02/2016   Procedure: POSTERIOR REPAIR (RECTOCELE);  Surgeon: Brayton Mars, MD;  Location: [facility];  Service: Gynecology;  Laterality: N/A;   TOTAL VAGINAL HYSTERECTOMY     TUBAL LIGATION     VAGINAL HYSTERECTOMY Bilateral 10/19/2015   Procedure: HYSTERECTOMY VAGINAL WITH BILATERAL SALPINGO-OOPHERECTOMY;  Surgeon: Brayton Mars, MD;  Location: [facility];  Service: Gynecology;  Laterality: Bilateral;    Prior to Admission medications   Medication Sig Start Date End Date Taking?  Authorizing Provider  amLODipine (NORVASC) 5 MG tablet Take 1 tablet (5 mg total) by mouth daily. To take with 2.5 mg tablet 04/30/21  Yes Jon Billings, NP  Aspirin-Acetaminophen-Caffeine (GOODYS EXTRA STRENGTH) [phone] MG PACK Take 1 packet by mouth daily.   Yes [provider]  buPROPion (WELLBUTRIN SR) 150 MG 12 hr tablet Take 150 mg by mouth 2 (two) times daily. 03/22/21  Yes [provider]  gabapentin (NEURONTIN) 300 MG capsule Take 1 capsule (300 mg total) by mouth 3 (three) times daily. 04/30/21  Yes Jon Billings, NP  methocarbamol (ROBAXIN) 500 MG tablet Take 1 tablet (500 mg total) by mouth 4 (four) times daily. 04/30/21  Yes Jon Billings, NP  metoprolol succinate (TOPROL-XL) 50 MG 24 hr tablet Take with or immediately following a meal. 04/30/21  Yes Jon Billings, NP  QUEtiapine (SEROQUEL XR) 200 MG 24 hr tablet Take 1 tablet (200 mg total) by mouth at bedtime. 04/30/21  Yes Jon Billings, NP  venlafaxine XR (EFFEXOR-XR) 150 MG 24 hr capsule Take 1 capsule (150 mg total) by mouth daily with breakfast. 06/12/20  Yes Kathrine Haddock, NP  diclofenac Sodium (VOLTAREN) 1 % GEL Apply 2 g topically 4 (four) times daily. 04/21/20   Eulogio Bear, NP  EPINEPHrine 0.3 mg/0.3 mL IJ SOAJ injection INJECT 0.3 MLS INTO THE MUSCLE ONCE 11/18/20   Jon Billings, NP  estradiol (ESTRACE) 0.1 MG/GM vaginal cream Place 0.5 Applicatorfuls vaginally 2 (two) times a week. 11/19/20   Jon Billings, NP  fluticasone-salmeterol (ADVAIR) 250-50 MCG/ACT AEPB INHALE 1 PUFF INTO THE LUNGS 2 TIMES DAILY 02/03/21   Cannady, Henrine Screws T, NP  hydrochlorothiazide (HYDRODIURIL) 25 MG tablet Take 1 tablet (25 mg total) by mouth daily. 04/30/21   Jon Billings, NP  Magnesium 500 MG TABS Take 500 mg by mouth daily.    [provider]  montelukast (SINGULAIR) 10 MG tablet Take 1 tablet (10 mg total) by mouth at bedtime. 04/30/21   Jon Billings, NP  Tiotropium Bromide  Monohydrate (SPIRIVA RESPIMAT) 2.5 MCG/ACT AERS INHALE 1 PUFF BY MOUTH INTO THE LUNGS DAILY 11/18/20   Jon Billings, NP  topiramate (TOPAMAX) 50 MG tablet Take 50 mg by mouth daily. 12/18/20   [provider]    Allergies as of 05/24/2021 - Review Complete 05/24/2021  Allergen Reaction Noted   Bee venom Anaphylaxis 10/14/2015   Other  05/25/2020   Lisinopril Anxiety 06/16/2016    Family History  Problem Relation Age of Onset   Breast cancer Mother    Pancreatic cancer Mother    Cancer Mother        breast and pancreatic   Hypertension Mother    Migraines Mother    Diabetes Mother    Heart disease Father    Mental illness Sister        depression   Heart attack Sister    Diabetes Sister    Alcohol abuse Maternal Grandfather    Cancer Maternal Grandmother        lung and brain   Mental illness Sister        depression    Social History   Socioeconomic History   Marital status: Single    Spouse name: Not on file   Number of children: Not on file   Years of education: Not on file   Highest education level: Not on file  Occupational History   Not on file  Tobacco Use   Smoking status: Every Day    Packs/day: 1.00    Types: Cigarettes   Smokeless tobacco: Never   Tobacco comments:     started age 75  Vaping Use   Vaping Use: Former  Substance and Sexual Activity   Alcohol use: Yes    Alcohol/week: 14.0 standard drinks    Types: 14 Cans of beer per week    Comment:     Drug use: No   Sexual activity: Not Currently  Other Topics Concern   Not on file  Social History Narrative   Not on file   Social Determinants of Health   Financial Resource Strain: Not on file  Food Insecurity: Not on file  Transportation Needs: Not on file  Physical Activity: Not on file  Stress: Not on file  Social Connections: Not on file  Intimate Partner Violence: Not on file    Review of Systems: See HPI, otherwise negative ROS  Physical Exam: LMP  (LMP Unknown)   General:   Alert,  pleasant and cooperative in NAD Head:  Normocephalic and atraumatic. Neck:  Supple; no masses or thyromegaly. Lungs:  Clear throughout to auscultation, normal respiratory effort.    Heart:  +S1, +S2, Regular rate and rhythm, No edema. Abdomen:  Soft, nontender and nondistended. Normal bowel sounds, without guarding, and without rebound.   Neurologic:  Alert and  oriented x4;  grossly normal neurologically.  Impression/Plan: Monique Tucker is here for an endoscopy  to be performed for  evaluation of esophageal varices    Risks, benefits, limitations, and alternatives  regarding endoscopy have been reviewed with the patient.  Questions have been answered.  All parties agreeable.   Jonathon Bellows, MD  05/25/2021, 8:36 AM

## 2021-05-25 NOTE — Op Note (Signed)
[facility] Gastroenterology Patient Name: Monique Tucker Procedure Date: 05/25/2021 9:22 AM MRN: [id number] Account #: [id number] Date of Birth: 1960/08/14 Admit Type: Outpatient Age: 60 Room: [facility] ENDO ROOM 2 Gender: Female Note Status: Finalized Instrument Name: Altamese Cabal Endoscope [id number] Procedure:             Upper GI endoscopy Indications:           Cirrhosis rule out esophageal varices Providers:             Jonathon Bellows MD, MD Referring MD:          Jon Billings (Referring MD) Medicines:             Monitored Anesthesia Care Complications:         No immediate complications. Procedure:             Pre-Anesthesia Assessment:                        - Prior to the procedure, a History and Physical was                         performed, and patient medications, allergies and                         sensitivities were reviewed. The patient's tolerance                         of previous anesthesia was reviewed.                        - The risks and benefits of the procedure and the                         sedation options and risks were discussed with the                         patient. All questions were answered and informed                         consent was obtained.                        - ASA Grade Assessment: III - A patient with severe                         systemic disease.                        After obtaining informed consent, the endoscope was                         passed under direct vision. Throughout the procedure,                         the patient's blood pressure, pulse, and oxygen                         saturations were monitored continuously. The Endoscope  was introduced through the mouth, and advanced to the                         third part of duodenum. The upper GI endoscopy was                         accomplished with ease. The patient tolerated the                         procedure well. Findings:       The esophagus was normal.      The examined duodenum was normal.      Diffuse moderate inflammation characterized by congestion (edema) and       erythema was found on the greater curvature of the stomach. Biopsies       were taken with a cold forceps for histology.      The cardia and gastric fundus were normal on retroflexion. Impression:            - Normal esophagus.                        - Normal examined duodenum.                        - Gastritis. Biopsied. Recommendation:        - Await pathology results.                        - Discharge patient to home (with escort).                        - Resume previous diet.                        - Continue present medications.                        - Repeat upper endoscopy in 3 years for surveillance. Procedure Code(s):     --- Professional ---                        [phone], Esophagogastroduodenoscopy, flexible,                         transoral; with biopsy, single or multiple Diagnosis Code(s):     --- Professional ---                        K29.70, Gastritis, unspecified, without bleeding                        K74.60, Unspecified cirrhosis of liver CPT copyright 2019 American Medical Association. All rights reserved. The codes documented in this report are preliminary and upon coder review may  be revised to meet current compliance requirements. Jonathon Bellows, MD Jonathon Bellows MD, MD 05/25/2021 9:30:51 AM This report has been signed electronically. Number of Addenda: 0 Note Initiated On: 05/25/2021 9:22 AM Estimated Blood Loss:  Estimated blood loss: none.      [facility]

## 2021-05-25 NOTE — Anesthesia Preprocedure Evaluation (Signed)
Anesthesia Evaluation  Patient identified by MRN, date of birth, ID band Patient awake    Reviewed: Allergy & Precautions, H&P , NPO status , Patient's Chart, lab work & pertinent test results, reviewed documented beta blocker date and time   Airway Mallampati: II   Neck ROM: full    Dental  (+) Poor Dentition   Pulmonary COPD, Current Smoker and Patient abstained from smoking.,    Pulmonary exam normal        Cardiovascular Exercise Tolerance: Poor hypertension, On Medications negative cardio ROS Normal cardiovascular exam Rhythm:regular Rate:Normal     Neuro/Psych PSYCHIATRIC DISORDERS Anxiety Depression Bipolar Disorder  Neuromuscular disease    GI/Hepatic negative GI ROS, (+)   Esophageal Varices    , Hepatitis -  Endo/Other  negative endocrine ROS  Renal/GU negative Renal ROS  negative genitourinary   Musculoskeletal   Abdominal   Peds  Hematology negative hematology ROS (+)   Anesthesia Other Findings Past Medical History: No date: Acute hepatitis C No date: Anxiety No date: AR (allergic rhinitis) No date: Arthritis No date: Bulging lumbar disc No date: COPD (chronic obstructive pulmonary disease) (HCC)     Comment:  mild No date: Depression No date: Hypertension No date: Plantar fasciitis No date: Restless leg syndrome, controlled No date: Sciatica     Comment:  left Past Surgical History: No date: BLADDER REPAIR 05/13/2020: CATARACT EXTRACTION W/PHACO; Left     Comment:  Procedure: CATARACT EXTRACTION PHACO AND INTRAOCULAR               LENS PLACEMENT ([place]) LEFT;  Surgeon: Leandrew Koyanagi, MD;  Location: [facility];  Service:               Ophthalmology;  Laterality: Left;  5.15 049.8 10.4% 05/27/2020: CATARACT EXTRACTION W/PHACO; Right     Comment:  Procedure: CATARACT EXTRACTION PHACO AND INTRAOCULAR               LENS PLACEMENT ([place]) RIGHT;  Surgeon:  Leandrew Koyanagi, MD;  Location: [facility];  Service:               Ophthalmology;  Laterality: Right;  3.29 0:55.2 6.0% 10/19/2015: CYSTOCELE REPAIR; N/A     Comment:  Procedure: ANTERIOR REPAIR (CYSTOCELE);  Surgeon: Brayton Mars, MD;  Location: [facility];  Service:               Gynecology;  Laterality: N/A; No date: EYE SURGERY 05/02/2016: RECTOCELE REPAIR; N/A     Comment:  Procedure: POSTERIOR REPAIR (RECTOCELE);  Surgeon:               Brayton Mars, MD;  Location: [facility];  Service:               Gynecology;  Laterality: N/A; No date: TOTAL VAGINAL HYSTERECTOMY No date: TUBAL LIGATION 10/19/2015: VAGINAL HYSTERECTOMY; Bilateral     Comment:  Procedure: HYSTERECTOMY VAGINAL WITH BILATERAL               SALPINGO-OOPHERECTOMY;  Surgeon: Brayton Mars,               MD;  Location: [facility];  Service: Gynecology;  Laterality: Bilateral; BMI    Body Mass Index: 21.46 kg/m     Reproductive/Obstetrics negative OB ROS                             Anesthesia Physical Anesthesia Plan  ASA: 3  Anesthesia Plan: General   Post-op Pain Management:    Induction:   PONV Risk Score and Plan:   Airway Management Planned:   Additional Equipment:   Intra-op Plan:   Post-operative Plan:   Informed Consent: I have reviewed the patients History and Physical, chart, labs and discussed the procedure including the risks, benefits and alternatives for the proposed anesthesia with the patient or authorized representative who has indicated his/her understanding and acceptance.     Dental Advisory Given  Plan Discussed with: CRNA  Anesthesia Plan Comments:         Anesthesia Quick Evaluation

## 2021-05-25 NOTE — Anesthesia Postprocedure Evaluation (Signed)
Anesthesia Post Note  Patient: VOLA BENEKE  Procedure(s) Performed: ESOPHAGOGASTRODUODENOSCOPY (EGD) WITH PROPOFOL  Patient location during evaluation: PACU Anesthesia Type: General Level of consciousness: awake and alert Pain management: pain level controlled Vital Signs Assessment: post-procedure vital signs reviewed and stable Respiratory status: spontaneous breathing, nonlabored ventilation, respiratory function stable and patient connected to nasal cannula oxygen Cardiovascular status: blood pressure returned to baseline and stable Postop Assessment: no apparent nausea or vomiting Anesthetic complications: no   No notable events documented.   Last Vitals:  Vitals:   05/25/21 0829 05/25/21 0933  BP: 107/73   Pulse: 63   Resp: 16   Temp: (!) 35.6 C (!) 35.9 C  SpO2: 100%     Last Pain:  Vitals:   05/25/21 0933  TempSrc: Temporal  PainSc: 0-No pain                 Molli Barrows

## 2021-05-25 NOTE — Progress Notes (Signed)
Dr Tasia Catchings , Monique Tucker - the patient I referred yesterday

## 2021-05-25 NOTE — Transfer of Care (Signed)
Immediate Anesthesia Transfer of Care Note  Patient: Monique Tucker  Procedure(s) Performed: ESOPHAGOGASTRODUODENOSCOPY (EGD) WITH PROPOFOL  Patient Location: PACU and Endoscopy Unit  Anesthesia Type:General  Level of Consciousness: drowsy  Airway & Oxygen Therapy: Patient Spontanous Breathing  Post-op Assessment: Report given to RN  Post vital signs: stable  Last Vitals:  Vitals Value Taken Time  BP 107/55 05/25/21 0932  Temp 35.9 C 05/25/21 0933  Pulse 55 05/25/21 0932  Resp 16 05/25/21 0932  SpO2 97 % 05/25/21 0932  Vitals shown include unvalidated device data.  Last Pain:  Vitals:   05/25/21 0933  TempSrc: Temporal         Complications: No notable events documented.

## 2021-05-26 ENCOUNTER — Other Ambulatory Visit: Payer: Self-pay

## 2021-05-26 ENCOUNTER — Inpatient Hospital Stay: Payer: No Typology Code available for payment source

## 2021-05-26 ENCOUNTER — Inpatient Hospital Stay: Payer: No Typology Code available for payment source | Attending: Oncology | Admitting: Oncology

## 2021-05-26 ENCOUNTER — Encounter: Payer: Self-pay | Admitting: Gastroenterology

## 2021-05-26 VITALS — BP 126/71 | HR 67 | Temp 97.7°F | Wt 126.0 lb

## 2021-05-26 DIAGNOSIS — F32A Depression, unspecified: Secondary | ICD-10-CM | POA: Diagnosis not present

## 2021-05-26 DIAGNOSIS — K7469 Other cirrhosis of liver: Secondary | ICD-10-CM | POA: Insufficient documentation

## 2021-05-26 DIAGNOSIS — D376 Neoplasm of uncertain behavior of liver, gallbladder and bile ducts: Secondary | ICD-10-CM | POA: Diagnosis present

## 2021-05-26 DIAGNOSIS — Z7189 Other specified counseling: Secondary | ICD-10-CM | POA: Insufficient documentation

## 2021-05-26 DIAGNOSIS — R772 Abnormality of alphafetoprotein: Secondary | ICD-10-CM | POA: Diagnosis not present

## 2021-05-26 DIAGNOSIS — B182 Chronic viral hepatitis C: Secondary | ICD-10-CM | POA: Diagnosis not present

## 2021-05-26 DIAGNOSIS — J449 Chronic obstructive pulmonary disease, unspecified: Secondary | ICD-10-CM | POA: Diagnosis not present

## 2021-05-26 DIAGNOSIS — K769 Liver disease, unspecified: Secondary | ICD-10-CM | POA: Diagnosis not present

## 2021-05-26 DIAGNOSIS — K7030 Alcoholic cirrhosis of liver without ascites: Secondary | ICD-10-CM | POA: Insufficient documentation

## 2021-05-26 DIAGNOSIS — Z79899 Other long term (current) drug therapy: Secondary | ICD-10-CM | POA: Insufficient documentation

## 2021-05-26 DIAGNOSIS — F419 Anxiety disorder, unspecified: Secondary | ICD-10-CM | POA: Insufficient documentation

## 2021-05-26 DIAGNOSIS — I10 Essential (primary) hypertension: Secondary | ICD-10-CM | POA: Insufficient documentation

## 2021-05-26 DIAGNOSIS — C220 Liver cell carcinoma: Secondary | ICD-10-CM | POA: Diagnosis not present

## 2021-05-26 DIAGNOSIS — F17210 Nicotine dependence, cigarettes, uncomplicated: Secondary | ICD-10-CM | POA: Insufficient documentation

## 2021-05-26 LAB — CBC WITH DIFFERENTIAL/PLATELET
Abs Immature Granulocytes: 0.03 10*3/uL (ref 0.00–0.07)
Basophils Absolute: 0 10*3/uL (ref 0.0–0.1)
Basophils Relative: 0 %
Eosinophils Absolute: 0.3 10*3/uL (ref 0.0–0.5)
Eosinophils Relative: 3 %
HCT: 43.7 % (ref 36.0–46.0)
Hemoglobin: 14.9 g/dL (ref 12.0–15.0)
Immature Granulocytes: 0 %
Lymphocytes Relative: 36 %
Lymphs Abs: 3.3 10*3/uL (ref 0.7–4.0)
MCH: 34.1 pg — ABNORMAL HIGH (ref 26.0–34.0)
MCHC: 34.1 g/dL (ref 30.0–36.0)
MCV: 100 fL (ref 80.0–100.0)
Monocytes Absolute: 1 10*3/uL (ref 0.1–1.0)
Monocytes Relative: 11 %
Neutro Abs: 4.5 10*3/uL (ref 1.7–7.7)
Neutrophils Relative %: 50 %
Platelets: 252 10*3/uL (ref 150–400)
RBC: 4.37 MIL/uL (ref 3.87–5.11)
RDW: 12.6 % (ref 11.5–15.5)
WBC: 9.2 10*3/uL (ref 4.0–10.5)
nRBC: 0 % (ref 0.0–0.2)

## 2021-05-26 LAB — PROTIME-INR
INR: 1 (ref 0.8–1.2)
Prothrombin Time: 12.9 seconds (ref 11.4–15.2)

## 2021-05-26 LAB — ANTI-MICROSOMAL ANTIBODY LIVER / KIDNEY: LKM1 Ab: 1.2 Units (ref 0.0–20.0)

## 2021-05-26 LAB — HEPATITIS B SURFACE ANTIBODY,QUALITATIVE: Hep B Surface Ab, Qual: REACTIVE

## 2021-05-26 LAB — ANA: Anti Nuclear Antibody (ANA): NEGATIVE

## 2021-05-26 LAB — COMPREHENSIVE METABOLIC PANEL
ALT: 40 U/L (ref 0–44)
AST: 53 U/L — ABNORMAL HIGH (ref 15–41)
Albumin: 4.4 g/dL (ref 3.5–5.0)
Alkaline Phosphatase: 111 U/L (ref 38–126)
Anion gap: 11 (ref 5–15)
BUN: 18 mg/dL (ref 8–23)
CO2: 26 mmol/L (ref 22–32)
Calcium: 10.2 mg/dL (ref 8.9–10.3)
Chloride: 99 mmol/L (ref 98–111)
Creatinine, Ser: 1.13 mg/dL — ABNORMAL HIGH (ref 0.44–1.00)
GFR, Estimated: 55 mL/min — ABNORMAL LOW (ref >60)
Glucose, Bld: 110 mg/dL — ABNORMAL HIGH (ref 70–99)
Potassium: 3.8 mmol/L (ref 3.5–5.1)
Sodium: 136 mmol/L (ref 135–145)
Total Bilirubin: 0.6 mg/dL (ref 0.3–1.2)
Total Protein: 8.7 g/dL — ABNORMAL HIGH (ref 6.5–8.1)

## 2021-05-26 LAB — HEPATITIS B CORE ANTIBODY, TOTAL: Hep B Core Total Ab: NEGATIVE

## 2021-05-26 LAB — IMMUNOGLOBULINS A/E/G/M, SERUM
IgA/Immunoglobulin A, Serum: 194 mg/dL (ref 87–352)
IgE (Immunoglobulin E), Serum: 18 IU/mL (ref 6–495)
IgG (Immunoglobin G), Serum: 1689 mg/dL — ABNORMAL HIGH (ref 586–1602)
IgM (Immunoglobulin M), Srm: 159 mg/dL (ref 26–217)

## 2021-05-26 LAB — IRON,TIBC AND FERRITIN PANEL
Ferritin: 510 ng/mL — ABNORMAL HIGH (ref 15–150)
Iron Saturation: 21 % (ref 15–55)
Iron: 62 ug/dL (ref 27–139)
Total Iron Binding Capacity: 295 ug/dL (ref 250–450)
UIBC: 233 ug/dL (ref 118–369)

## 2021-05-26 LAB — SURGICAL PATHOLOGY

## 2021-05-26 LAB — CERULOPLASMIN: Ceruloplasmin: 25.7 mg/dL (ref 19.0–39.0)

## 2021-05-26 LAB — MITOCHONDRIAL/SMOOTH MUSCLE AB PNL
Mitochondrial Ab: <20 Units (ref 0.0–20.0)
Smooth Muscle Ab: 11 Units (ref 0–19)

## 2021-05-26 LAB — APTT: aPTT: 31 seconds (ref 24–36)

## 2021-05-26 LAB — CELIAC DISEASE AB SCREEN W/RFX
Antigliadin Abs, IgA: 10 units (ref 0–19)
Transglutaminase IgA: <2 U/mL (ref 0–3)

## 2021-05-26 LAB — HEPATITIS C ANTIBODY: Hep C Virus Ab: >11 s/co ratio — ABNORMAL HIGH (ref 0.0–0.9)

## 2021-05-26 LAB — HEPATITIS A ANTIBODY, TOTAL: hep A Total Ab: NEGATIVE

## 2021-05-26 LAB — CK: Total CK: 72 U/L (ref 32–182)

## 2021-05-26 LAB — HIV ANTIBODY (ROUTINE TESTING W REFLEX): HIV Screen 4th Generation wRfx: NONREACTIVE

## 2021-05-26 LAB — HEPATITIS B E ANTIGEN: Hep B E Ag: POSITIVE — AB

## 2021-05-26 LAB — HEPATITIS B SURFACE ANTIGEN: Hepatitis B Surface Ag: NEGATIVE

## 2021-05-26 LAB — HEPATITIS B E ANTIBODY: Hep B E Ab: NEGATIVE

## 2021-05-26 LAB — ALPHA-1-ANTITRYPSIN: A-1 Antitrypsin: 221 mg/dL — ABNORMAL HIGH (ref 101–187)

## 2021-05-26 NOTE — Progress Notes (Signed)
Hematology/Oncology Consult note [phone] [phone]      Patient Care Team: Jon Billings, NP as PCP - General Clent Jacks, RN as Oncology Nurse Navigator  REFERRING PROVIDER: Jonathon Bellows, MD  CHIEF COMPLAINTS/REASON FOR VISIT:  Evaluation of multifocal [place]  HISTORY OF PRESENTING ILLNESS:   Monique Tucker is a  60 y.o.  female with PMH listed below was seen in consultation at the request of  Jonathon Bellows, MD  for evaluation of multifocal [facility]  Patient was accompanied by her sister Patient had ultrasounds done in June 2022 which showed features of liver cirrhosis and a scattered hypodense hepatic lesions largest measuring 2.4 cm. 03/22/2021, patient had a liver MRI done which showed a 3 hyperenhancing lesions of the liver.  Highly suspicious for [place], LIRAD 5. Size of 3 lesions are 2.0 x 1.7 cm, 1.4 x 1.1 cm, 2.1 x 1.4 cm. Liver cirrhosis.  Patient was recommended by primary care provider to establish care with gastroenterology. 04/30/2021, lab work showed elevated alkaline phosphatase of 132, AST and ALT are mildly elevated.  Normal bilirubin.  05/24/2021, patient was seen by gastroenterology Dr. Vicente Males. Additional blood work was obtained.  AFP was elevated in the 300s. Hepatitis C antibody is positive.  Negative antimicrosomal antibody, negative HIV, negative ANA, negative mitochondrial/smooth muscle cell antibody panel.  Ferritin was elevated at 510.  Normal iron saturation.  Elevated immunoglobulins 1689, celiac panel negative.  Alpha antitrypsin elevated 221.  Patient has a history of alcohol use.  14 cans of beer per week.  She has stopped alcohol use since her visits with gastroenterology.  Current everyday smoker.  A pack a day.  She is working on smoke cessation Patient works at [employer]. She has anxiety and depression and she previously follows up with a psychiatrist.  She has not followed up with a psychiatrist recently.  Patient takes  Wellbutrin, Effexor, Seroquel.  Review of Systems  Constitutional:  Negative for appetite change, chills, fatigue and fever.  HENT:   Negative for hearing loss and voice change.   Eyes:  Negative for eye problems.  Respiratory:  Negative for chest tightness and cough.   Cardiovascular:  Negative for chest pain.  Gastrointestinal:  Negative for abdominal distention, abdominal pain and blood in stool.  Endocrine: Negative for hot flashes.  Genitourinary:  Negative for difficulty urinating and frequency.   Musculoskeletal:  Negative for arthralgias.  Skin:  Negative for itching and rash.  Neurological:  Negative for extremity weakness.  Hematological:  Negative for adenopathy.  Psychiatric/Behavioral:  Positive for depression. Negative for confusion. The patient is nervous/anxious.    MEDICAL HISTORY:  Past Medical History:  Diagnosis Date   Acute hepatitis C    Anxiety    AR (allergic rhinitis)    Arthritis    Bulging lumbar disc    COPD (chronic obstructive pulmonary disease) (HCC)    mild   Depression    Hypertension    Plantar fasciitis    Restless leg syndrome, controlled    Sciatica    left    SURGICAL HISTORY: Past Surgical History:  Procedure Laterality Date   BLADDER REPAIR     CATARACT EXTRACTION W/PHACO Left 05/13/2020   Procedure: CATARACT EXTRACTION PHACO AND INTRAOCULAR LENS PLACEMENT ([place]) LEFT;  Surgeon: Leandrew Koyanagi, MD;  Location: [facility];  Service: Ophthalmology;  Laterality: Left;  5.15 049.8 10.4%   CATARACT EXTRACTION W/PHACO Right 05/27/2020   Procedure: CATARACT EXTRACTION PHACO AND INTRAOCULAR LENS PLACEMENT ([facility])  RIGHT;  Surgeon: Leandrew Koyanagi, MD;  Location: [facility];  Service: Ophthalmology;  Laterality: Right;  3.29 0:55.2 6.0%   CYSTOCELE REPAIR N/A 10/19/2015   Procedure: ANTERIOR REPAIR (CYSTOCELE);  Surgeon: Brayton Mars, MD;  Location: [facility];  Service: Gynecology;  Laterality: N/A;    ESOPHAGOGASTRODUODENOSCOPY (EGD) WITH PROPOFOL N/A 05/25/2021   Procedure: ESOPHAGOGASTRODUODENOSCOPY (EGD) WITH PROPOFOL;  Surgeon: Jonathon Bellows, MD;  Location: [facility];  Service: Gastroenterology;  Laterality: N/A;   EYE SURGERY     RECTOCELE REPAIR N/A 05/02/2016   Procedure: POSTERIOR REPAIR (RECTOCELE);  Surgeon: Brayton Mars, MD;  Location: [facility];  Service: Gynecology;  Laterality: N/A;   TOTAL VAGINAL HYSTERECTOMY     TUBAL LIGATION     VAGINAL HYSTERECTOMY Bilateral 10/19/2015   Procedure: HYSTERECTOMY VAGINAL WITH BILATERAL SALPINGO-OOPHERECTOMY;  Surgeon: Brayton Mars, MD;  Location: [facility];  Service: Gynecology;  Laterality: Bilateral;    SOCIAL HISTORY: Social History   Socioeconomic History   Marital status: Single    Spouse name: Not on file   Number of children: Not on file   Years of education: Not on file   Highest education level: Not on file  Occupational History   Not on file  Tobacco Use   Smoking status: Every Day    Packs/day: 1.00    Types: Cigarettes   Smokeless tobacco: Never   Tobacco comments:     started age 13  Vaping Use   Vaping Use: Former  Substance and Sexual Activity   Alcohol use: Yes    Alcohol/week: 14.0 standard drinks    Types: 14 Cans of beer per week    Comment:     Drug use: No   Sexual activity: Not Currently  Other Topics Concern   Not on file  Social History Narrative   Not on file   Social Determinants of Health   Financial Resource Strain: Not on file  Food Insecurity: Not on file  Transportation Needs: Not on file  Physical Activity: Not on file  Stress: Not on file  Social Connections: Not on file  Intimate Partner Violence: Not on file    FAMILY HISTORY: Family History  Problem Relation Age of Onset   Breast cancer Mother    Pancreatic cancer Mother    Cancer Mother        breast and pancreatic   Hypertension Mother    Migraines Mother    Diabetes Mother    Heart disease  Father    Mental illness Sister        depression   Heart attack Sister    Diabetes Sister    Alcohol abuse Maternal Grandfather    Cancer Maternal Grandmother        lung and brain   Mental illness Sister        depression    ALLERGIES:  is allergic to bee venom, other, and lisinopril.  MEDICATIONS:  Current Outpatient Medications  Medication Sig Dispense Refill   amLODipine (NORVASC) 5 MG tablet Take 1 tablet (5 mg total) by mouth daily. To take with 2.5 mg tablet 90 tablet 1   Aspirin-Acetaminophen-Caffeine (GOODYS EXTRA STRENGTH) 500-325-65 MG PACK Take 1 packet by mouth daily.     buPROPion (WELLBUTRIN SR) 150 MG 12 hr tablet Take 150 mg by mouth 2 (two) times daily.     diclofenac Sodium (VOLTAREN) 1 % GEL Apply 2 g topically 4 (four) times daily. 50 g 0   EPINEPHrine 0.3 mg/0.3 mL IJ  SOAJ injection INJECT 0.3 MLS INTO THE MUSCLE ONCE 2 each 1   estradiol (ESTRACE) 0.1 MG/GM vaginal cream Place 0.5 Applicatorfuls vaginally 2 (two) times a week. 42.5 g 11   fluticasone-salmeterol (ADVAIR) 250-50 MCG/ACT AEPB INHALE 1 PUFF INTO THE LUNGS 2 TIMES DAILY 60 each 0   gabapentin (NEURONTIN) 300 MG capsule Take 1 capsule (300 mg total) by mouth 3 (three) times daily. 270 capsule 1   hydrochlorothiazide (HYDRODIURIL) 25 MG tablet Take 1 tablet (25 mg total) by mouth daily. 90 tablet 1   Magnesium 500 MG TABS Take 500 mg by mouth daily.     methocarbamol (ROBAXIN) 500 MG tablet Take 1 tablet (500 mg total) by mouth 4 (four) times daily. 90 tablet 1   metoprolol succinate (TOPROL-XL) 50 MG 24 hr tablet Take with or immediately following a meal. 90 tablet 1   montelukast (SINGULAIR) 10 MG tablet Take 1 tablet (10 mg total) by mouth at bedtime. 90 tablet 1   QUEtiapine (SEROQUEL XR) 200 MG 24 hr tablet Take 1 tablet (200 mg total) by mouth at bedtime. 90 tablet 1   Tiotropium Bromide Monohydrate (SPIRIVA RESPIMAT) 2.5 MCG/ACT AERS INHALE 1 PUFF BY MOUTH INTO THE LUNGS DAILY 4 g 11    topiramate (TOPAMAX) 50 MG tablet Take 50 mg by mouth daily.     venlafaxine XR (EFFEXOR-XR) 150 MG 24 hr capsule Take 1 capsule (150 mg total) by mouth daily with breakfast. 90 capsule 0   Current Facility-Administered Medications  Medication Dose Route Frequency Provider Last Rate Last Admin   betamethasone acetate-betamethasone sodium phosphate (CELESTONE) injection 3 mg  3 mg Intramuscular Once Daylene Katayama M, DPM       betamethasone acetate-betamethasone sodium phosphate (CELESTONE) injection 3 mg  3 mg Intramuscular Once Edrick Kins, DPM         PHYSICAL EXAMINATION: ECOG PERFORMANCE STATUS: 1 - Symptomatic but completely ambulatory Vitals:   05/26/21 1525  BP: 126/71  Pulse: 67  Temp: 97.7 F (36.5 C)  SpO2: 94%   Filed Weights   05/26/21 1525  Weight: 126 lb (57.2 kg)    Physical Exam Constitutional:      General: She is not in acute distress. HENT:     Head: Normocephalic and atraumatic.  Eyes:     General: No scleral icterus. Cardiovascular:     Rate and Rhythm: Normal rate and regular rhythm.     Heart sounds: Normal heart sounds.  Pulmonary:     Effort: Pulmonary effort is normal. No respiratory distress.     Breath sounds: No wheezing.  Abdominal:     General: Bowel sounds are normal. There is no distension.     Palpations: Abdomen is soft.  Musculoskeletal:        General: No deformity. Normal range of motion.     Cervical back: Normal range of motion and neck supple.  Skin:    General: Skin is warm and dry.     Findings: No erythema or rash.  Neurological:     Mental Status: She is alert and oriented to person, place, and time. Mental status is at baseline.     Cranial Nerves: No cranial nerve deficit.     Coordination: Coordination normal.  Psychiatric:     Comments: Anxious and tearful    LABORATORY DATA:  I have reviewed the data as listed Lab Results  Component Value Date   WBC 9.2 05/26/2021   HGB 14.9 05/26/2021   HCT 43.7  05/26/2021   MCV 100.0 05/26/2021   PLT 252 05/26/2021   Recent Labs    06/12/20 1424 11/18/20 1406 04/30/21 1145 05/26/21 1621  NA 139 140 143 136  K 3.8 3.7 4.0 3.8  CL 101 100 108* 99  CO2 25 25 26 26   GLUCOSE 72 102* 81 110*  BUN 13 13 13 18   CREATININE 0.75 0.79 0.64 1.13*  CALCIUM 9.5 9.2 8.7 10.2  GFRNONAA 87  --   --  55*  GFRAA 100  --   --   --   PROT 7.2 7.2 6.7 8.7*  ALBUMIN 4.2 4.2 3.9 4.4  AST 68* 103* 47* 53*  ALT 70* 107* 43* 40  ALKPHOS 144* 149* 132* 111  BILITOT 0.3 0.3 0.2 0.6   Iron/TIBC/Ferritin/ %Sat    Component Value Date/Time   IRON 62 05/24/2021 1344   TIBC 295 05/24/2021 1344   FERRITIN 510 (H) 05/24/2021 1344   IRONPCTSAT 21 05/24/2021 1344      RADIOGRAPHIC STUDIES: I have personally reviewed the radiological images as listed and agreed with the findings in the report. MR LIVER W WO CONTRAST  Result Date: 03/22/2021 CLINICAL DATA:  Characterize liver lesions incidentally identified by ultrasound EXAM: MRI ABDOMEN WITHOUT AND WITH CONTRAST TECHNIQUE: Multiplanar multisequence MR imaging of the abdomen was performed both before and after the administration of intravenous contrast. CONTRAST:  7.95mL GADAVIST GADOBUTROL 1 MMOL/ML IV SOLN COMPARISON:  Right upper quadrant ultrasound, 12/24/2020 FINDINGS: Lower chest: No acute findings. Hepatobiliary: Coarse, nodular, cirrhotic morphology of the liver. There are 3 arterially hyperenhancing lesions of the liver, with faint underlying intrinsic T2 hyperintensity, in the posterior inferior right lobe of the liver, hepatic segment VI, measuring 2.0 x 1.7 cm (series 13, image 40), in the anterior inferior right lobe of the liver, hepatic segment V/VI, measuring 1.4 x 1.1 cm (series 13, image 42), and of the anterior inferior left lobe of the liver, hepatic segment IV B, measuring 2.1 x 1.4 cm (series 13, image 46). These lesions demonstrate subtle evidence of washout on multiphasic contrast enhanced imaging,  without clear evidence of capsular enhancement (series 17, image 42, 40). Lesion in hepatic segment VI may demonstrate an enhancing central scar (series 20, image 37). No gallstones. No biliary ductal dilatation. Pancreas: No mass, inflammatory changes, or other parenchymal abnormality identified. Spleen:  Within normal limits in size and appearance. Adrenals/Urinary Tract: No masses identified. No evidence of hydronephrosis. Stomach/Bowel: Visualized portions within the abdomen are unremarkable. Vascular/Lymphatic: No pathologically enlarged lymph nodes identified. No abdominal aortic aneurysm demonstrated. Aortic atherosclerosis. Other:  None. Musculoskeletal: No suspicious bone lesions identified. IMPRESSION: 1. There are three arterially hyperenhancing lesions of the liver, with faint underlying T2 hyperintensity, in the posterior inferior right lobe of the liver, hepatic segment VI, measuring 2.0 x 1.7 cm, in the anterior inferior right lobe of the liver, hepatic segment V/VI, measuring 1.4 x 1.1 cm, and of the anterior inferior left lobe of the liver, hepatic segment IV B, measuring 2.1 x 1.4 cm. These lesions demonstrate subtle evidence of washout on multiphasic contrast enhanced imaging, without clear evidence of capsular enhancement. Although imaging characteristics could suggest focal nodular hyperplasia, in the setting of overt cirrhosis these are highly suspicious for multifocal hepatocellular carcinoma. LI-RADS category 5. 2. Cirrhotic morphology of the liver. Electronically Signed   By: Eddie Candle M.D.   On: 03/22/2021 12:33       ASSESSMENT & PLAN:  1. Alcoholic cirrhosis of liver without ascites ([place])  2. Liver lesion   3. Chronic hepatitis C without hepatic coma ([place])   4. Hepatocellular carcinoma ([place])   5. Goals of care, counseling/discussion    #Multifocal liver lesion, LIRAD 5, definitively HCC. staging AJCC UICC 8th edition T2 Nx Mx AFP is elevated to 300. Check liver  function. Child Pugh A I will obtain CT chest without contrast for staging.  If no extraheaptic metastasis.  Recommend patient to establish care with hepatobiliary surgeon at [facility] for evaluation of resectability.  If not resectable, recommend local therapy with embolization, RFA, bridging to possible transplant if feasible.   Anxiety and depression. Recommend patient closely follow up with psychiatrist.  Hepatitis C, follow up with GI for Hepatitis C treatment.   Orders Placed This Encounter  Procedures   Protime-INR    Standing Status:   Future    Number of Occurrences:   1    Standing Expiration Date:   05/26/2022   APTT    Standing Status:   Future    Number of Occurrences:   1    Standing Expiration Date:   05/26/2022   CBC with Differential/Platelet    Standing Status:   Future    Number of Occurrences:   1    Standing Expiration Date:   05/26/2022   Hemochromatosis DNA-PCR(c282y,h63d)    Standing Status:   Future    Number of Occurrences:   1    Standing Expiration Date:   05/26/2022   Comprehensive metabolic panel    Standing Status:   Future    Number of Occurrences:   1    Standing Expiration Date:   05/26/2022    All questions were answered. The patient knows to call the clinic with any problems questions or concerns.  cc Jonathon Bellows, MD    Return of visit: TBD Thank you for this kind referral and the opportunity to participate in the care of this patient. A copy of today's note is routed to referring provider   Earlie Server, MD, PhD  05/26/2021

## 2021-05-27 ENCOUNTER — Telehealth: Payer: Self-pay | Admitting: *Deleted

## 2021-05-27 NOTE — Telephone Encounter (Signed)
Sister, Kieth Brightly, wants clarification on patient's cancer stage and would like a call back to further discuss this. Pt "heard that she had stage 5 cancer." Sister doesn't think this is correct and wanted clarification.

## 2021-05-28 LAB — HEPATITIS C GENOTYPE: Hepatitis C Genotype: 3

## 2021-05-31 NOTE — Telephone Encounter (Signed)
Returned call to [place], no answer. Left message with callback number.

## 2021-06-01 ENCOUNTER — Ambulatory Visit: Payer: No Typology Code available for payment source | Admitting: Oncology

## 2021-06-01 ENCOUNTER — Other Ambulatory Visit: Payer: No Typology Code available for payment source

## 2021-06-01 ENCOUNTER — Ambulatory Visit
Admission: RE | Admit: 2021-06-01 | Discharge: 2021-06-01 | Disposition: A | Discharge status: Home / Self Care | Payer: PRIVATE HEALTH INSURANCE | Source: Ambulatory Visit | Attending: Oncology | Admitting: Oncology

## 2021-06-01 ENCOUNTER — Other Ambulatory Visit: Payer: Self-pay

## 2021-06-01 DIAGNOSIS — K769 Liver disease, unspecified: Secondary | ICD-10-CM | POA: Insufficient documentation

## 2021-06-01 DIAGNOSIS — K7460 Unspecified cirrhosis of liver: Secondary | ICD-10-CM | POA: Insufficient documentation

## 2021-06-01 DIAGNOSIS — C220 Liver cell carcinoma: Secondary | ICD-10-CM | POA: Insufficient documentation

## 2021-06-01 DIAGNOSIS — J432 Centrilobular emphysema: Secondary | ICD-10-CM | POA: Diagnosis not present

## 2021-06-01 DIAGNOSIS — I700 Atherosclerosis of aorta: Secondary | ICD-10-CM | POA: Insufficient documentation

## 2021-06-02 LAB — HEMOCHROMATOSIS DNA-PCR(C282Y,H63D)

## 2021-06-03 NOTE — Telephone Encounter (Signed)
CT chest showed no cancer in the chest.  Dr. Tasia Catchings will contact surgeon to arrange an appt for consultation.   Pt informed of results and plan.

## 2021-06-04 ENCOUNTER — Telehealth: Payer: Self-pay

## 2021-06-04 DIAGNOSIS — K769 Liver disease, unspecified: Secondary | ICD-10-CM

## 2021-06-04 NOTE — Telephone Encounter (Signed)
Urgent referral has been faxed to Dr. Mauricio Po office   Ph: [phone] Fax: [phone]

## 2021-06-04 NOTE — Telephone Encounter (Signed)
-----   Message from Earlie Server, MD sent at 06/03/2021  9:28 PM EST ----- Please refer her urgently to [place]. [facility] transplant Psychologist, sport and exercise. Reason is [facility] Update me about her appt date. Thanks.

## 2021-06-07 ENCOUNTER — Telehealth: Payer: Self-pay

## 2021-06-07 DIAGNOSIS — R160 Hepatomegaly, not elsewhere classified: Secondary | ICD-10-CM

## 2021-06-07 NOTE — Telephone Encounter (Signed)
Called patient and left her a detailed message letting her know that for some reason one of her labs were not drawn. Therefore, Dr. Vicente Males wanted her to have it drawn. Patient was given the lab hours and options to go to a [facility] location, our office or [facility] off of [address] and [address] [facility]. Patient was also informed that Dr. Vicente Males was on vacation.

## 2021-06-07 NOTE — Telephone Encounter (Signed)
-----   Message from Jonathon Bellows, MD sent at 05/31/2021  2:50 PM EST ----- I had ordered hepatitis C genotype as well as viral load.  Genotype has resulted with a viral load since that has not been drawn.  Can we confirm and if needed can we have her get the hepatitis C viral load checked

## 2021-06-09 NOTE — Telephone Encounter (Signed)
Called [facility] transplant clinic to follow up on referral. Receptionist will reach ou to Dr. Mauricio Po nurse, Leda Gauze, to give up a call with update on referral.   Jim Like, RN  Ph [phone]

## 2021-06-11 ENCOUNTER — Ambulatory Visit: Admit: 2021-06-11 | Discharge: 2021-06-12 | Payer: PRIVATE HEALTH INSURANCE

## 2021-06-11 DIAGNOSIS — C220 Liver cell carcinoma: Principal | ICD-10-CM

## 2021-06-11 NOTE — Telephone Encounter (Signed)
I sent the urgent referral last week, but still dont see her scheduled with Dr. Shearon Stalls. I have called his nurse multiple times with no answer. I have left a voicemail but still have not had a call back.

## 2021-06-14 ENCOUNTER — Ambulatory Visit: Admit: 2021-06-14 | Discharge: 2021-06-15 | Payer: PRIVATE HEALTH INSURANCE

## 2021-06-14 DIAGNOSIS — K769 Liver disease, unspecified: Principal | ICD-10-CM

## 2021-06-14 DIAGNOSIS — C220 Liver cell carcinoma: Principal | ICD-10-CM

## 2021-06-14 DIAGNOSIS — K7460 Unspecified cirrhosis of liver: Principal | ICD-10-CM

## 2021-06-16 ENCOUNTER — Inpatient Hospital Stay: Payer: No Typology Code available for payment source | Attending: Oncology | Admitting: Hospice and Palliative Medicine

## 2021-06-16 ENCOUNTER — Other Ambulatory Visit: Payer: Self-pay

## 2021-06-16 DIAGNOSIS — C220 Liver cell carcinoma: Secondary | ICD-10-CM

## 2021-06-16 NOTE — Telephone Encounter (Signed)
Pt scheduled at [facility] on 1/6

## 2021-06-16 NOTE — Progress Notes (Signed)
Multidisciplinary Oncology Council Documentation  YAHAYRA GEIS was presented by our [facility] on 06/16/2021, which included representatives from:  Palliative Care Dietitian  Physical/Occupational Therapist Nurse Navigator Genetics Speech Therapist Social work Survivorship RN Financial Navigator Research RN   Chia currently presents with history of [place].  We reviewed previous medical and familial history, history of present illness, and recent lab results along with all available histopathologic and imaging studies. The [place] considered available treatment options and made the following recommendations/referrals:  -recommendation for referral to genetics due to family history.  The MOC is a meeting of clinicians from various specialty areas who evaluate and discuss patients for whom a multidisciplinary approach is being considered. Final determinations in the plan of care are those of the provider(s).   Today's extended care, comprehensive team conference, Baylee was not present for the discussion and was not examined.

## 2021-06-24 ENCOUNTER — Other Ambulatory Visit: Payer: Self-pay

## 2021-06-26 ENCOUNTER — Ambulatory Visit: Admit: 2021-06-26 | Discharge: 2021-06-27 | Payer: PRIVATE HEALTH INSURANCE

## 2021-06-27 LAB — HEPATITIS C GENOTYPE: Hepatitis C Genotype: 3

## 2021-06-27 LAB — HCV RNA QUANT
HCV log10: 5.994 log10 IU/mL
Hepatitis C Quantitation: 986000 IU/mL

## 2021-07-01 ENCOUNTER — Other Ambulatory Visit: Payer: Self-pay | Admitting: Nurse Practitioner

## 2021-07-01 DIAGNOSIS — C220 Liver cell carcinoma: Principal | ICD-10-CM

## 2021-07-02 ENCOUNTER — Ambulatory Visit: Admit: 2021-07-02 | Discharge: 2021-07-02 | Payer: PRIVATE HEALTH INSURANCE

## 2021-07-02 DIAGNOSIS — C220 Liver cell carcinoma: Principal | ICD-10-CM

## 2021-07-02 DIAGNOSIS — C249 Malignant neoplasm of biliary tract, unspecified: Principal | ICD-10-CM

## 2021-07-02 NOTE — Telephone Encounter (Signed)
Discontinued 01/01/21. Not on current med profile.

## 2021-07-14 NOTE — Progress Notes (Signed)
BP (!) 161/78 (BP Location: Left Arm, Cuff Size: Small)    Pulse 84    Temp 98.3 F (36.8 C) (Oral)    Ht 5\' 3"  (1.6 m)    Wt 120 lb 12.8 oz (54.8 kg)    LMP  (LMP Unknown)    SpO2 93%    BMI 21.40 kg/m    Subjective:    Patient ID: Monique Tucker, female    DOB: 28-Aug-1960, 61 y.o.   MRN: [id number]  HPI: Monique Tucker is a 61 y.o. female  Chief Complaint  Patient presents with   URI    Pt states she has had sinus pressure, congestion, cough, headache,and drainage that started this past Saturday.   Dizziness    Pt states she stopped taking the Metoprolol at the suggestion of her Oncology doctor due to orthostatic dizziness. States her dizziness has gotten better.    UPPER RESPIRATORY TRACT INFECTION Worst symptom: symptoms started 6 days ago Fever: no Cough: yes Shortness of breath: yes Wheezing: no Chest pain: no Chest tightness: no Chest congestion: yes Nasal congestion: yes Runny nose: yes Post nasal drip: yes Sneezing: yes Sore throat: no Swollen glands: no Sinus pressure: yes Headache: yes Face pain: yes Toothache: no Ear pain: yes bilateral Ear pressure: yes bilateral Eyes red/itching:yes Eye drainage/crusting: no  Vomiting: no Rash: no Fatigue: yes Sick contacts: no Strep contacts: no  Context: stable Recurrent sinusitis: no Relief with OTC cold/cough medications: no  Treatments attempted: none   HYPERTENSION Hypertension status: uncontrolled  Satisfied with current treatment? no Duration of hypertension: years BP monitoring frequency:  not checking BP range:  BP medication side effects:  no Medication compliance: excellent compliance Previous BP meds:amlodipine and HCTZ Aspirin: no Recurrent headaches: no Visual changes: no Palpitations: no Dyspnea: no Chest pain: no Lower extremity edema: no Dizzy/lightheaded: yes-   Patient is going through the process to get a liver transplant. She is in a study at [facility] for the liver cancer.  She  will be following up with the liver specialist and starting Chemo on February.   Relevant past medical, surgical, family and social history reviewed and updated as indicated. Interim medical history since our last visit reviewed. Allergies and medications reviewed and updated.  Review of Systems  Constitutional:  Positive for fatigue. Negative for fever.  HENT:  Positive for congestion, postnasal drip, rhinorrhea, sinus pressure, sinus pain and sneezing. Negative for dental problem, ear pain and sore throat.   Eyes:  Negative for visual disturbance.  Respiratory:  Negative for cough, chest tightness, shortness of breath and wheezing.   Cardiovascular:  Negative for chest pain, palpitations and leg swelling.  Gastrointestinal:  Negative for vomiting.  Skin:  Negative for rash.  Neurological:  Positive for dizziness and headaches.   Per HPI unless specifically indicated above     Objective:    BP (!) 161/78 (BP Location: Left Arm, Cuff Size: Small)    Pulse 84    Temp 98.3 F (36.8 C) (Oral)    Ht 5\' 3"  (1.6 m)    Wt 120 lb 12.8 oz (54.8 kg)    LMP  (LMP Unknown)    SpO2 93%    BMI 21.40 kg/m   Wt Readings from Last 3 Encounters:  07/15/21 120 lb 12.8 oz (54.8 kg)  05/26/21 126 lb (57.2 kg)  05/25/21 125 lb (56.7 kg)    Physical Exam Vitals and nursing note reviewed.  Constitutional:      General: She  is not in acute distress.    Appearance: Normal appearance. She is normal weight. She is not ill-appearing, toxic-appearing or diaphoretic.  HENT:     Head: Normocephalic.     Right Ear: Tympanic membrane and external ear normal.     Left Ear: Tympanic membrane and external ear normal.     Nose: Congestion and rhinorrhea present.     Right Sinus: Maxillary sinus tenderness and frontal sinus tenderness present.     Left Sinus: Maxillary sinus tenderness and frontal sinus tenderness present.     Mouth/Throat:     Mouth: Mucous membranes are moist.     Pharynx: Oropharynx is clear.  Posterior oropharyngeal erythema present. No oropharyngeal exudate.  Eyes:     General:        Right eye: No discharge.        Left eye: No discharge.     Extraocular Movements: Extraocular movements intact.     Conjunctiva/sclera: Conjunctivae normal.     Pupils: Pupils are equal, round, and reactive to light.  Cardiovascular:     Rate and Rhythm: Normal rate and regular rhythm.     Heart sounds: No murmur heard. Pulmonary:     Effort: Pulmonary effort is normal. No respiratory distress.     Breath sounds: Normal breath sounds. No wheezing or rales.  Musculoskeletal:     Cervical back: Normal range of motion and neck supple.  Skin:    General: Skin is warm and dry.     Capillary Refill: Capillary refill takes less than 2 seconds.  Neurological:     General: No focal deficit present.     Mental Status: She is alert and oriented to person, place, and time. Mental status is at baseline.  Psychiatric:        Mood and Affect: Mood normal.        Behavior: Behavior normal.        Thought Content: Thought content normal.        Judgment: Judgment normal.    Results for orders placed or performed in visit on 07/15/21  Veritor Flu A/B Waived  Result Value Ref Range   Influenza A Negative Negative   Influenza B Negative Negative      Assessment & Plan:   Problem List Items Addressed This Visit       Cardiovascular and Mediastinum   Hypertension    Chronic.  Uncontrolled.  Continue with current medication regimen.  Stop the Metoprolol.  Add Hydralazine 10mg  TID. Return to clinic in 1 months for reevaluation.  Call sooner if concerns arise.        Relevant Medications   amLODipine (NORVASC) 2.5 MG tablet   hydrALAZINE (APRESOLINE) 10 MG tablet   Other Visit Diagnoses     Acute non-recurrent frontal sinusitis    -  Primary   Augmentin sent to the pharmacy for patient. Complete course of antibiotics. FU if symptoms do not improve.   Relevant Medications   cetirizine  (ZYRTEC) 10 MG tablet   amoxicillin-clavulanate (AUGMENTIN) 875-125 MG tablet   Acute cough       Relevant Orders   Veritor Flu A/B Waived (Completed)   Novel Coronavirus, NAA ([facility])   Need for shingles vaccine       Need for hepatitis A vaccination       Relevant Orders   Hepatitis A vaccine adult IM (Completed)        Follow up plan: Return in about 1 month (around 08/15/2021) for BP  Check.

## 2021-07-15 ENCOUNTER — Ambulatory Visit: Payer: PRIVATE HEALTH INSURANCE | Admitting: Nurse Practitioner

## 2021-07-15 ENCOUNTER — Encounter: Payer: Self-pay | Admitting: Nurse Practitioner

## 2021-07-15 ENCOUNTER — Other Ambulatory Visit: Payer: Self-pay

## 2021-07-15 VITALS — BP 161/78 | HR 84 | Temp 98.3°F | Ht 63.0 in | Wt 120.8 lb

## 2021-07-15 DIAGNOSIS — Z23 Encounter for immunization: Secondary | ICD-10-CM

## 2021-07-15 DIAGNOSIS — I10 Essential (primary) hypertension: Secondary | ICD-10-CM | POA: Diagnosis not present

## 2021-07-15 DIAGNOSIS — R051 Acute cough: Secondary | ICD-10-CM | POA: Diagnosis not present

## 2021-07-15 DIAGNOSIS — J0110 Acute frontal sinusitis, unspecified: Secondary | ICD-10-CM

## 2021-07-15 LAB — VERITOR FLU A/B WAIVED
Influenza A: NEGATIVE
Influenza B: NEGATIVE

## 2021-07-15 MED ORDER — HYDRALAZINE HCL 10 MG PO TABS: 10.0000 mg | ORAL_TABLET | Freq: Three times a day (TID) | ORAL | 0 refills | Status: DC

## 2021-07-15 MED ORDER — AMOXICILLIN-POT CLAVULANATE 875-125 MG PO TABS: 1.0000 | ORAL_TABLET | Freq: Two times a day (BID) | ORAL | 0 refills | Status: AC

## 2021-07-15 NOTE — Patient Instructions (Signed)
Boost or Ensure

## 2021-07-15 NOTE — Assessment & Plan Note (Signed)
Chronic.  Uncontrolled.  Continue with current medication regimen.  Stop the Metoprolol.  Add Hydralazine 10mg  TID. Return to clinic in 1 months for reevaluation.  Call sooner if concerns arise.

## 2021-07-15 NOTE — Progress Notes (Signed)
Results discussed with patient during visit.

## 2021-07-16 LAB — SARS-COV-2, NAA 2 DAY TAT

## 2021-07-16 LAB — NOVEL CORONAVIRUS, NAA: SARS-CoV-2, NAA: DETECTED — AB

## 2021-07-16 NOTE — Progress Notes (Signed)
Please let patient know that she has COVID. She is outside the window for medication.  Please let me know if she has any questions.

## 2021-07-20 NOTE — Progress Notes (Signed)
Video visit in 3 months , she is getting care at [facility] pesently and we can decide on treatment plans for hep C at that time

## 2021-08-06 ENCOUNTER — Ambulatory Visit: Admit: 2021-08-06 | Discharge: 2021-08-06 | Payer: PRIVATE HEALTH INSURANCE

## 2021-08-06 DIAGNOSIS — C220 Liver cell carcinoma: Principal | ICD-10-CM

## 2021-08-06 MED ORDER — OXYCODONE 5 MG TABLET
ORAL_TABLET | Freq: Four times a day (QID) | ORAL | 0 refills | 5 days | Status: CP | PRN
Start: 2021-08-06 — End: 2021-08-11
  Filled 2021-08-06: qty 20, 5d supply, fill #0

## 2021-08-06 MED ORDER — ONDANSETRON HCL 4 MG TABLET
ORAL_TABLET | Freq: Three times a day (TID) | ORAL | 0 refills | 5 days | Status: CP | PRN
Start: 2021-08-06 — End: 2021-10-20
  Filled 2021-08-06: qty 15, 5d supply, fill #0

## 2021-08-06 MED ORDER — LEVOFLOXACIN 500 MG TABLET
ORAL_TABLET | ORAL | 0 refills | 7 days | Status: CP
Start: 2021-08-06 — End: 2021-08-13
  Filled 2021-08-06: qty 7, 7d supply, fill #0

## 2021-08-13 ENCOUNTER — Ambulatory Visit: Admit: 2021-08-13 | Discharge: 2021-08-14 | Payer: PRIVATE HEALTH INSURANCE

## 2021-08-13 DIAGNOSIS — C220 Liver cell carcinoma: Principal | ICD-10-CM

## 2021-08-13 MED ORDER — LACTULOSE 10 GRAM/15 ML ORAL SOLUTION
Freq: Every day | ORAL | 0 refills | 63 days | Status: CP
Start: 2021-08-13 — End: ?

## 2021-08-16 ENCOUNTER — Ambulatory Visit: Payer: PRIVATE HEALTH INSURANCE | Admitting: Nurse Practitioner

## 2021-08-18 ENCOUNTER — Other Ambulatory Visit: Payer: Self-pay | Admitting: Nurse Practitioner

## 2021-08-18 NOTE — Telephone Encounter (Signed)
Requested medication (s) are due for refill today: Yes  Requested medication (s) are on the active medication list: Yes  Last refill:  04/30/21  Future visit scheduled: No  Notes to clinic:  See request.    Requested Prescriptions  Pending Prescriptions Disp Refills   methocarbamol (ROBAXIN) 500 MG tablet [Pharmacy Med Name: METHOCARBAMOL 500 MG TAB] 90 tablet 1    Sig: TAKE 1 TABLET BY MOUTH 4 TIMES A DAY     Not Delegated - Analgesics:  Muscle Relaxants Failed - 08/18/2021  9:20 AM      Failed - This refill cannot be delegated      Passed - Valid encounter within last 6 months    Recent Outpatient Visits           1 month ago Acute non-recurrent frontal sinusitis   [facility] Jon Billings, NP   3 months ago Moderate COPD (chronic obstructive pulmonary disease) ([place])   [place], Karen, NP   7 months ago Acute right-sided low back pain without sciatica   [facility] Jon Billings, NP   9 months ago Slurred speech   [place], NP   1 year ago Elevated liver enzymes   [facility] Kathrine Haddock, NP       Future Appointments             In 2 months Jonathon Bellows, MD [place]

## 2021-08-19 NOTE — Telephone Encounter (Signed)
Pt scheduled 3/13

## 2021-09-06 ENCOUNTER — Other Ambulatory Visit: Payer: Self-pay

## 2021-09-06 ENCOUNTER — Encounter: Payer: Self-pay | Admitting: Nurse Practitioner

## 2021-09-06 ENCOUNTER — Ambulatory Visit (INDEPENDENT_AMBULATORY_CARE_PROVIDER_SITE_OTHER): Payer: PRIVATE HEALTH INSURANCE | Admitting: Nurse Practitioner

## 2021-09-06 VITALS — BP 149/73 | HR 76 | Temp 98.3°F | Wt 117.4 lb

## 2021-09-06 DIAGNOSIS — I10 Essential (primary) hypertension: Secondary | ICD-10-CM

## 2021-09-06 DIAGNOSIS — C220 Liver cell carcinoma: Secondary | ICD-10-CM | POA: Diagnosis not present

## 2021-09-06 DIAGNOSIS — Z23 Encounter for immunization: Secondary | ICD-10-CM

## 2021-09-06 MED ORDER — LOSARTAN POTASSIUM 50 MG PO TABS: 50.0000 mg | ORAL_TABLET | Freq: Every day | ORAL | 0 refills | Status: DC

## 2021-09-06 NOTE — Progress Notes (Unsigned)
BP (!) 149/73    Pulse 76    Temp 98.3 F (36.8 C) (Oral)    Wt 117 lb 6.4 oz (53.3 kg)    LMP  (LMP Unknown)    SpO2 96%    BMI 20.80 kg/m    Subjective:    Patient ID: Monique Tucker, female    DOB: 09/29/1960, 61 y.o.   MRN: [id number]  HPI: Monique Tucker is a 61 y.o. female  Chief Complaint  Patient presents with   Hypertension    Pt states she has stopped taking the Hydralazine because she states that her cancer doctor told her to not take it due to low potassium levels    HYPERTENSION Hypertension status: uncontrolled  Satisfied with current treatment? no Duration of hypertension: years BP monitoring frequency:  not checking BP range:  BP medication side effects:  no Medication compliance: excellent compliance Previous BP meds:amlodipine and HCTZ Aspirin: no Recurrent headaches: no Visual changes: no Palpitations: no Dyspnea: no Chest pain: no Lower extremity edema: no Dizzy/lightheaded:no  Patient is going through the process to get a liver transplant. She is in a study at [facility] for the liver cancer.  She will be following up with the liver specialist and starting Chemo on February.   Relevant past medical, surgical, family and social history reviewed and updated as indicated. Interim medical history since our last visit reviewed. Allergies and medications reviewed and updated.  Review of Systems  Constitutional:  Positive for fatigue. Negative for fever.  HENT:  Positive for congestion, postnasal drip, rhinorrhea, sinus pressure, sinus pain and sneezing. Negative for dental problem, ear pain and sore throat.   Eyes:  Negative for visual disturbance.  Respiratory:  Negative for cough, chest tightness, shortness of breath and wheezing.   Cardiovascular:  Negative for chest pain, palpitations and leg swelling.  Gastrointestinal:  Negative for vomiting.  Skin:  Negative for rash.  Neurological:  Positive for dizziness and headaches.   Per HPI unless  specifically indicated above     Objective:    BP (!) 149/73    Pulse 76    Temp 98.3 F (36.8 C) (Oral)    Wt 117 lb 6.4 oz (53.3 kg)    LMP  (LMP Unknown)    SpO2 96%    BMI 20.80 kg/m   Wt Readings from Last 3 Encounters:  09/06/21 117 lb 6.4 oz (53.3 kg)  07/15/21 120 lb 12.8 oz (54.8 kg)  05/26/21 126 lb (57.2 kg)    Physical Exam Vitals and nursing note reviewed.  Constitutional:      General: She is not in acute distress.    Appearance: Normal appearance. She is normal weight. She is not ill-appearing, toxic-appearing or diaphoretic.  HENT:     Head: Normocephalic.     Right Ear: Tympanic membrane and external ear normal.     Left Ear: Tympanic membrane and external ear normal.     Nose: Congestion and rhinorrhea present.     Right Sinus: Maxillary sinus tenderness and frontal sinus tenderness present.     Left Sinus: Maxillary sinus tenderness and frontal sinus tenderness present.     Mouth/Throat:     Mouth: Mucous membranes are moist.     Pharynx: Oropharynx is clear. Posterior oropharyngeal erythema present. No oropharyngeal exudate.  Eyes:     General:        Right eye: No discharge.        Left eye: No discharge.  Extraocular Movements: Extraocular movements intact.     Conjunctiva/sclera: Conjunctivae normal.     Pupils: Pupils are equal, round, and reactive to light.  Cardiovascular:     Rate and Rhythm: Normal rate and regular rhythm.     Heart sounds: No murmur heard. Pulmonary:     Effort: Pulmonary effort is normal. No respiratory distress.     Breath sounds: Normal breath sounds. No wheezing or rales.  Musculoskeletal:     Cervical back: Normal range of motion and neck supple.  Skin:    General: Skin is warm and dry.     Capillary Refill: Capillary refill takes less than 2 seconds.  Neurological:     General: No focal deficit present.     Mental Status: She is alert and oriented to person, place, and time. Mental status is at baseline.   Psychiatric:        Mood and Affect: Mood normal.        Behavior: Behavior normal.        Thought Content: Thought content normal.        Judgment: Judgment normal.    Results for orders placed or performed in visit on 07/15/21  Novel Coronavirus, NAA ([facility])   Specimen: Nasopharyngeal(NP) swabs in vial transport medium  Result Value Ref Range   SARS-CoV-2, NAA Detected (A) Not Detected  SARS-COV-2, NAA 2 DAY TAT  Result Value Ref Range   SARS-CoV-2, NAA 2 DAY TAT Performed   Veritor Flu A/B Waived  Result Value Ref Range   Influenza A Negative Negative   Influenza B Negative Negative      Assessment & Plan:   Problem List Items Addressed This Visit       Cardiovascular and Mediastinum   Hypertension - Primary   Relevant Medications   losartan (COZAAR) 50 MG tablet   Other Relevant Orders   Basic Metabolic Panel (BMET)   Other Visit Diagnoses     Need for shingles vaccine       Relevant Orders   Varicella-zoster vaccine subcutaneous        Follow up plan: No follow-ups on file.

## 2021-09-06 NOTE — Progress Notes (Incomplete)
BP (!) 149/73    Pulse 76    Temp 98.3 F (36.8 C) (Oral)    Wt 117 lb 6.4 oz (53.3 kg)    LMP  (LMP Unknown)    SpO2 96%    BMI 20.80 kg/m    Subjective:    Patient ID: Monique Tucker, female    DOB: 1960/12/10, 61 y.o.   MRN: [id number]  HPI: Monique Tucker is a 61 y.o. female  Chief Complaint  Patient presents with   Hypertension    Pt states she has stopped taking the Hydralazine because she states that her cancer doctor told her to not take it due to low potassium levels    HYPERTENSION Hypertension status: uncontrolled  Satisfied with current treatment? no Duration of hypertension: years BP monitoring frequency:  not checking BP range:  BP medication side effects:  no Medication compliance: excellent compliance Previous BP meds:amlodipine and HCTZ Aspirin: no Recurrent headaches: no Visual changes: no Palpitations: no Dyspnea: no Chest pain: no Lower extremity edema: no Dizzy/lightheaded: yes-     Relevant past medical, surgical, family and social history reviewed and updated as indicated. Interim medical history since our last visit reviewed. Allergies and medications reviewed and updated.  Review of Systems  Constitutional:  Positive for fatigue. Negative for fever.  HENT:  Positive for congestion, postnasal drip, rhinorrhea, sinus pressure, sinus pain and sneezing. Negative for dental problem, ear pain and sore throat.   Eyes:  Negative for visual disturbance.  Respiratory:  Negative for cough, chest tightness, shortness of breath and wheezing.   Cardiovascular:  Negative for chest pain, palpitations and leg swelling.  Gastrointestinal:  Negative for vomiting.  Skin:  Negative for rash.  Neurological:  Positive for dizziness and headaches.   Per HPI unless specifically indicated above     Objective:    BP (!) 149/73    Pulse 76    Temp 98.3 F (36.8 C) (Oral)    Wt 117 lb 6.4 oz (53.3 kg)    LMP  (LMP Unknown)    SpO2 96%    BMI 20.80 kg/m   Wt  Readings from Last 3 Encounters:  09/06/21 117 lb 6.4 oz (53.3 kg)  07/15/21 120 lb 12.8 oz (54.8 kg)  05/26/21 126 lb (57.2 kg)    Physical Exam Vitals and nursing note reviewed.  Constitutional:      General: She is not in acute distress.    Appearance: Normal appearance. She is normal weight. She is not ill-appearing, toxic-appearing or diaphoretic.  HENT:     Head: Normocephalic.     Right Ear: Tympanic membrane and external ear normal.     Left Ear: Tympanic membrane and external ear normal.     Nose: Congestion and rhinorrhea present.     Right Sinus: Maxillary sinus tenderness and frontal sinus tenderness present.     Left Sinus: Maxillary sinus tenderness and frontal sinus tenderness present.     Mouth/Throat:     Mouth: Mucous membranes are moist.     Pharynx: Oropharynx is clear. Posterior oropharyngeal erythema present. No oropharyngeal exudate.  Eyes:     General:        Right eye: No discharge.        Left eye: No discharge.     Extraocular Movements: Extraocular movements intact.     Conjunctiva/sclera: Conjunctivae normal.     Pupils: Pupils are equal, round, and reactive to light.  Cardiovascular:     Rate and Rhythm:  Normal rate and regular rhythm.     Heart sounds: No murmur heard. Pulmonary:     Effort: Pulmonary effort is normal. No respiratory distress.     Breath sounds: Normal breath sounds. No wheezing or rales.  Musculoskeletal:     Cervical back: Normal range of motion and neck supple.  Skin:    General: Skin is warm and dry.     Capillary Refill: Capillary refill takes less than 2 seconds.  Neurological:     General: No focal deficit present.     Mental Status: She is alert and oriented to person, place, and time. Mental status is at baseline.  Psychiatric:        Mood and Affect: Mood normal.        Behavior: Behavior normal.        Thought Content: Thought content normal.        Judgment: Judgment normal.    Results for orders placed or  performed in visit on 07/15/21  Novel Coronavirus, NAA ([facility])   Specimen: Nasopharyngeal(NP) swabs in vial transport medium  Result Value Ref Range   SARS-CoV-2, NAA Detected (A) Not Detected  SARS-COV-2, NAA 2 DAY TAT  Result Value Ref Range   SARS-CoV-2, NAA 2 DAY TAT Performed   Veritor Flu A/B Waived  Result Value Ref Range   Influenza A Negative Negative   Influenza B Negative Negative      Assessment & Plan:   Problem List Items Addressed This Visit       Cardiovascular and Mediastinum   Hypertension - Primary     Follow up plan: No follow-ups on file.

## 2021-09-07 ENCOUNTER — Ambulatory Visit: Payer: Self-pay

## 2021-09-07 LAB — BASIC METABOLIC PANEL
BUN/Creatinine Ratio: 18 (ref 12–28)
BUN: 13 mg/dL (ref 8–27)
CO2: 24 mmol/L (ref 20–29)
Calcium: 8.9 mg/dL (ref 8.7–10.3)
Chloride: 103 mmol/L (ref 96–106)
Creatinine, Ser: 0.74 mg/dL (ref 0.57–1.00)
Glucose: 93 mg/dL (ref 70–99)
Potassium: 2.8 mmol/L — ABNORMAL LOW (ref 3.5–5.2)
Sodium: 142 mmol/L (ref 134–144)
eGFR: 92 mL/min/{1.73_m2} (ref >59)

## 2021-09-07 MED ORDER — POTASSIUM CHLORIDE CRYS ER 20 MEQ PO TBCR: 20.0000 meq | EXTENDED_RELEASE_TABLET | Freq: Every day | ORAL | 0 refills | Status: DC

## 2021-09-07 NOTE — Addendum Note (Signed)
Addended by: Jon Billings on: 09/07/2021 09:51 AM ? ? Modules accepted: Orders ? ?

## 2021-09-07 NOTE — Telephone Encounter (Signed)
Pt states she was told to stop Hydralazine for low K+. States does not have that med.  Did not see on current med profile.  ?Please review and advise ? ?After hours call. ?

## 2021-09-07 NOTE — Assessment & Plan Note (Signed)
Followed by Dr. Manson Allan.  Is up to date on Pneumonia. Hepatitis A vaccine started.  Will need second one in June.  First shingles vaccine given.  Will give second vaccine at follow up.  Continue with follow up with Dr. Manson Allan. ?

## 2021-09-07 NOTE — Telephone Encounter (Signed)
.  ntsReason for Disposition ?? [1] Caller has URGENT medicine question about med that PCP or specialist prescribed AND [2] triager unable to answer question ? ?Answer Assessment - Initial Assessment Questions ?1. NAME of MEDICATION: "What medicine are you calling about?" ?    BP med ?2. QUESTION: "What is your question?" (e.g., double dose of medicine, side effect) ?    Told to stop one I don't have ? ?Protocols used: Medication Question Call-A-AH ? ?

## 2021-09-07 NOTE — Progress Notes (Signed)
Please let patient know that her potassium is low again.  I recommend stopping the hydralazine and starting the losartan as discussed during the visit. I am going to send in a prescription for potassium supplement for 1x daily for 1 week.  I would like her to come back in 10 days to recheck her potassium.  This will give the losartan time to kick in and see if we will need ongoing potassium supplement.  I know she would like to do this with diet and that is the goal but right now she needs a supplement.

## 2021-09-07 NOTE — Assessment & Plan Note (Signed)
Chronic. Not well controlled.  Potassium is running low. Will change from hydralazine to Losartan. Follow up in 1 month for reevaluation. Labs ordered today.   ?

## 2021-09-07 NOTE — Telephone Encounter (Signed)
Pt called, left VM to call office back to discuss medication with a nurse.  ? ?Summary: questions about medication change  ? Pt requests call back to discuss change in medication. Pt has questions and concerns. Cb# [phone]   ?  ? ?

## 2021-09-08 NOTE — Telephone Encounter (Signed)
Called and spoke to patient. She states that her medications were discussed with her lab result message yesterday. Patient verbalized understanding to stop taking hydralizine and start taking losartan per Santiago Glad.  ?

## 2021-09-17 ENCOUNTER — Other Ambulatory Visit: Payer: Self-pay

## 2021-09-17 ENCOUNTER — Other Ambulatory Visit (INDEPENDENT_AMBULATORY_CARE_PROVIDER_SITE_OTHER): Payer: PRIVATE HEALTH INSURANCE

## 2021-09-17 DIAGNOSIS — E875 Hyperkalemia: Secondary | ICD-10-CM

## 2021-09-18 LAB — BASIC METABOLIC PANEL
BUN/Creatinine Ratio: 14 (ref 12–28)
BUN: 9 mg/dL (ref 8–27)
CO2: 22 mmol/L (ref 20–29)
Calcium: 8.3 mg/dL — ABNORMAL LOW (ref 8.7–10.3)
Chloride: 108 mmol/L — ABNORMAL HIGH (ref 96–106)
Creatinine, Ser: 0.65 mg/dL (ref 0.57–1.00)
Glucose: 84 mg/dL (ref 70–99)
Potassium: 3.8 mmol/L (ref 3.5–5.2)
Sodium: 143 mmol/L (ref 134–144)
eGFR: 100 mL/min/{1.73_m2} (ref >59)

## 2021-09-20 NOTE — Progress Notes (Signed)
Please let patient know that her potassium is back within normal range.  I recommend she consume a diet high in potassium and we will recheck it in 3 weeks to see if it has improved.  High potassium foods include beans, lentils, Potatoes, spinach and bananas. Please make her a lab appt.

## 2021-10-14 ENCOUNTER — Ambulatory Visit (INDEPENDENT_AMBULATORY_CARE_PROVIDER_SITE_OTHER): Payer: PRIVATE HEALTH INSURANCE | Admitting: Nurse Practitioner

## 2021-10-14 ENCOUNTER — Encounter: Payer: Self-pay | Admitting: Nurse Practitioner

## 2021-10-14 VITALS — BP 126/88 | HR 68 | Temp 98.4°F | Wt 112.8 lb

## 2021-10-14 DIAGNOSIS — C220 Liver cell carcinoma: Principal | ICD-10-CM

## 2021-10-14 DIAGNOSIS — I10 Essential (primary) hypertension: Secondary | ICD-10-CM | POA: Diagnosis not present

## 2021-10-14 NOTE — Assessment & Plan Note (Signed)
Chronic. Improved.  Potassium was running low but improved with change to Losartan from Hydralazine.  Will recheck at visit today.  Follow up in 3 months for reevaluation. Labs ordered today.   ?

## 2021-10-14 NOTE — Progress Notes (Signed)
? ?BP 126/88   Pulse 68   Temp 98.4 ?F (36.9 ?C) (Oral)   Wt 112 lb 12.8 oz (51.2 kg)   LMP  (LMP Unknown)   SpO2 93%   BMI 19.98 kg/m?   ? ?Subjective:  ? ? Patient ID: Monique Tucker, female    DOB: [birth year], 61 y.o.   MRN: [id number] ? ?HPI: ?Monique Tucker is a 61 y.o. female ? ?Chief Complaint  ?Patient presents with  ? Hypertension  ? ? ?HYPERTENSION ?Hypertension status: uncontrolled  ?Satisfied with current treatment? no ?Duration of hypertension: years ?BP monitoring frequency:  not checking ?BP range:  ?BP medication side effects:  no ?Medication compliance: excellent compliance ?Previous BP meds:amlodipine and HCTZ and losartan ?Aspirin: no ?Recurrent headaches: yes ?Visual changes: no ?Palpitations: no ?Dyspnea: no ?Chest pain: no ?Lower extremity edema: no ?Dizzy/lightheaded:no ?Patient is eating more potassium rich foods.   ? ? ?Relevant past medical, surgical, family and social history reviewed and updated as indicated. Interim medical history since our last visit reviewed. ?Allergies and medications reviewed and updated. ? ?Review of Systems  ?Eyes:  Negative for visual disturbance.  ?Respiratory:  Negative for cough, chest tightness and shortness of breath.   ?Cardiovascular:  Negative for chest pain, palpitations and leg swelling.  ?Neurological:  Positive for headaches. Negative for dizziness.  ? ?Per HPI unless specifically indicated above ? ?   ?Objective:  ?  ?BP 126/88   Pulse 68   Temp 98.4 ?F (36.9 ?C) (Oral)   Wt 112 lb 12.8 oz (51.2 kg)   LMP  (LMP Unknown)   SpO2 93%   BMI 19.98 kg/m?   ?Wt Readings from Last 3 Encounters:  ?10/14/21 112 lb 12.8 oz (51.2 kg)  ?09/06/21 117 lb 6.4 oz (53.3 kg)  ?07/15/21 120 lb 12.8 oz (54.8 kg)  ?  ?Physical Exam ?Vitals and nursing note reviewed.  ?Constitutional:   ?   General: She is not in acute distress. ?   Appearance: Normal appearance. She is normal weight. She is not ill-appearing, toxic-appearing or diaphoretic.  ?HENT:  ?   Head:  Normocephalic.  ?   Right Ear: External ear normal.  ?   Left Ear: External ear normal.  ?   Nose: Nose normal.  ?   Mouth/Throat:  ?   Mouth: Mucous membranes are moist.  ?   Pharynx: Oropharynx is clear.  ?Eyes:  ?   General:     ?   Right eye: No discharge.     ?   Left eye: No discharge.  ?   Extraocular Movements: Extraocular movements intact.  ?   Conjunctiva/sclera: Conjunctivae normal.  ?   Pupils: Pupils are equal, round, and reactive to light.  ?Cardiovascular:  ?   Rate and Rhythm: Normal rate and regular rhythm.  ?   Heart sounds: No murmur heard. ?Pulmonary:  ?   Effort: Pulmonary effort is normal. No respiratory distress.  ?   Breath sounds: Normal breath sounds. No wheezing or rales.  ?Musculoskeletal:  ?   Cervical back: Normal range of motion and neck supple.  ?Skin: ?   General: Skin is warm and dry.  ?   Capillary Refill: Capillary refill takes less than 2 seconds.  ?Neurological:  ?   General: No focal deficit present.  ?   Mental Status: She is alert and oriented to person, place, and time. Mental status is at baseline.  ?Psychiatric:     ?   Mood and  Affect: Mood normal.     ?   Behavior: Behavior normal.     ?   Thought Content: Thought content normal.     ?   Judgment: Judgment normal.  ? ? ?Results for orders placed or performed in visit on 09/17/21  ?Basic Metabolic Panel (BMET)  ?Result Value Ref Range  ? Glucose 84 70 - 99 mg/dL  ? BUN 9 8 - 27 mg/dL  ? Creatinine, Ser 0.65 0.57 - 1.00 mg/dL  ? eGFR 100 >59 mL/min/1.73  ? BUN/Creatinine Ratio 14 12 - 28  ? Sodium 143 134 - 144 mmol/L  ? Potassium 3.8 3.5 - 5.2 mmol/L  ? Chloride 108 (H) 96 - 106 mmol/L  ? CO2 22 20 - 29 mmol/L  ? Calcium 8.3 (L) 8.7 - 10.3 mg/dL  ? ?   ?Assessment & Plan:  ? ?Problem List Items Addressed This Visit   ? ?  ? Cardiovascular and Mediastinum  ? Hypertension - Primary  ?  Chronic. Improved.  Potassium was running low but improved with change to Losartan from Hydralazine.  Will recheck at visit today.  Follow  up in 3 months for reevaluation. Labs ordered today.   ? ?  ?  ? Relevant Orders  ? Basic Metabolic Panel (BMET)  ?  ? ?Follow up plan: ?Return in about 3 months (around 01/13/2022) for HTN, HLD, DM2 FU. ? ? ? ? ? ?

## 2021-10-15 ENCOUNTER — Other Ambulatory Visit: Admit: 2021-10-15 | Discharge: 2021-10-15 | Payer: PRIVATE HEALTH INSURANCE

## 2021-10-15 ENCOUNTER — Ambulatory Visit: Admit: 2021-10-15 | Discharge: 2021-10-15 | Payer: PRIVATE HEALTH INSURANCE

## 2021-10-15 DIAGNOSIS — C220 Liver cell carcinoma: Principal | ICD-10-CM

## 2021-10-15 LAB — BASIC METABOLIC PANEL
BUN/Creatinine Ratio: 20 (ref 12–28)
BUN: 13 mg/dL (ref 8–27)
CO2: 23 mmol/L (ref 20–29)
Calcium: 8.9 mg/dL (ref 8.7–10.3)
Chloride: 105 mmol/L (ref 96–106)
Creatinine, Ser: 0.66 mg/dL (ref 0.57–1.00)
Glucose: 79 mg/dL (ref 70–99)
Potassium: 3.8 mmol/L (ref 3.5–5.2)
Sodium: 140 mmol/L (ref 134–144)
eGFR: 100 mL/min/{1.73_m2} (ref >59)

## 2021-10-15 NOTE — Progress Notes (Signed)
Hi Monique Tucker.  Your potassium is within normal range.  No need to take the potassium supplement.  We will continue to monitor at future visits.

## 2021-10-18 ENCOUNTER — Telehealth: Payer: No Typology Code available for payment source | Admitting: Gastroenterology

## 2021-10-26 DIAGNOSIS — B182 Chronic viral hepatitis C: Principal | ICD-10-CM

## 2021-10-26 MED ORDER — GLECAPREVIR 100 MG-PIBRENTASVIR 40 MG TABLET
Freq: Every day | ORAL | 1 refills | 28 days | Status: CP
Start: 2021-10-26 — End: ?

## 2021-10-29 DIAGNOSIS — B182 Chronic viral hepatitis C: Principal | ICD-10-CM

## 2021-11-04 DIAGNOSIS — B182 Chronic viral hepatitis C: Principal | ICD-10-CM

## 2021-11-04 MED ORDER — GLECAPREVIR 100 MG-PIBRENTASVIR 40 MG TABLET
Freq: Every day | ORAL | 1 refills | 28 days | Status: CP
Start: 2021-11-04 — End: ?

## 2021-12-21 ENCOUNTER — Other Ambulatory Visit: Payer: Self-pay | Admitting: Nurse Practitioner

## 2021-12-21 NOTE — Telephone Encounter (Signed)
Requested medication (s) are due for refill today:   Yes for both takes 2.5 mg and 5 mg amlodipine  Requested medication (s) are on the active medication list:   Yes for both  Future visit scheduled:   Yes in 3 wks with Clydie Braun   Last ordered: 5 mg 04/30/2021 #90, 1 refill;    2.5 mg by a historical provider but looks like Clydie Braun wrote for it in Jan.     Requested Prescriptions  Pending Prescriptions Disp Refills   amLODipine (NORVASC) 2.5 MG tablet [Pharmacy Med Name: AMLODIPINE BESYLATE 2.5 MG TAB] 90 tablet     Sig: TAKE 1 TABLET BY MOUTH ONCE A DAY. TO TAKE IN CONJUNCTION WITH FIVE MG TABLET     Cardiovascular: Calcium Channel Blockers 2 Passed - 12/21/2021  9:31 AM      Passed - Last BP in normal range    BP Readings from Last 1 Encounters:  10/14/21 126/88         Passed - Last Heart Rate in normal range    Pulse Readings from Last 1 Encounters:  10/14/21 68         Passed - Valid encounter within last 6 months    Recent Outpatient Visits           2 months ago Primary hypertension   [facility] Larae Grooms, NP   3 months ago Primary hypertension   [facility] [place], Clydie Braun, NP   5 months ago Acute non-recurrent frontal sinusitis   [facility] Larae Grooms, NP   7 months ago Moderate COPD (chronic obstructive pulmonary disease) (HCC)   [facility] Larae Grooms, NP   11 months ago Acute right-sided low back pain without sciatica   [facility] Larae Grooms, NP       Future Appointments             In 3 weeks Larae Grooms, NP [facility]             amLODipine (NORVASC) 5 MG tablet [Pharmacy Med Name: AMLODIPINE BESYLATE 5 MG TAB] 90 tablet 1    Sig: TAKE 1 TABLET BY MOUTH ONCE A DAY. TO TAKE WITH 2.5 MG TABLET     Cardiovascular: Calcium Channel Blockers 2 Passed - 12/21/2021  9:31 AM      Passed - Last BP in normal range    BP Readings from Last 1  Encounters:  10/14/21 126/88         Passed - Last Heart Rate in normal range    Pulse Readings from Last 1 Encounters:  10/14/21 68         Passed - Valid encounter within last 6 months    Recent Outpatient Visits           2 months ago Primary hypertension   [facility] Larae Grooms, NP   3 months ago Primary hypertension   [facility] Larae Grooms, NP   5 months ago Acute non-recurrent frontal sinusitis   [facility] Larae Grooms, NP   7 months ago Moderate COPD (chronic obstructive pulmonary disease) (HCC)   [facility] Larae Grooms, NP   11 months ago Acute right-sided low back pain without sciatica   [facility] Larae Grooms, NP       Future Appointments             In 3 weeks Larae Grooms, NP [facility]

## 2022-01-13 ENCOUNTER — Ambulatory Visit (INDEPENDENT_AMBULATORY_CARE_PROVIDER_SITE_OTHER): Payer: PRIVATE HEALTH INSURANCE | Admitting: Nurse Practitioner

## 2022-01-13 ENCOUNTER — Encounter: Payer: Self-pay | Admitting: Nurse Practitioner

## 2022-01-13 VITALS — BP 138/68 | HR 72 | Temp 97.7°F | Wt 106.8 lb

## 2022-01-13 DIAGNOSIS — K7469 Other cirrhosis of liver: Secondary | ICD-10-CM

## 2022-01-13 DIAGNOSIS — J449 Chronic obstructive pulmonary disease, unspecified: Secondary | ICD-10-CM | POA: Diagnosis not present

## 2022-01-13 DIAGNOSIS — F3341 Major depressive disorder, recurrent, in partial remission: Secondary | ICD-10-CM

## 2022-01-13 DIAGNOSIS — B182 Chronic viral hepatitis C: Secondary | ICD-10-CM

## 2022-01-13 DIAGNOSIS — Z23 Encounter for immunization: Secondary | ICD-10-CM

## 2022-01-13 DIAGNOSIS — R7303 Prediabetes: Secondary | ICD-10-CM

## 2022-01-13 DIAGNOSIS — C220 Liver cell carcinoma: Secondary | ICD-10-CM

## 2022-01-13 DIAGNOSIS — I10 Essential (primary) hypertension: Secondary | ICD-10-CM

## 2022-01-13 DIAGNOSIS — E7800 Pure hypercholesterolemia, unspecified: Secondary | ICD-10-CM

## 2022-01-13 MED ORDER — METHOCARBAMOL 500 MG PO TABS: 500.0000 mg | ORAL_TABLET | Freq: Four times a day (QID) | ORAL | 1 refills | Status: DC

## 2022-01-13 NOTE — Assessment & Plan Note (Signed)
Chronic.  Controlled.  Continue with current medication regimen.  Patient is only smoking 1 cigarette daily.  Praised for decreasing usage.  Labs ordered today.  Return to clinic in 6 months for reevaluation.  Call sooner if concerns arise.

## 2022-01-13 NOTE — Assessment & Plan Note (Signed)
Chronic.  Controlled.  Continue with current medication regimen.  Labs ordered today.  Return to clinic in 6 months for reevaluation.  Call sooner if concerns arise.  ? ?

## 2022-01-13 NOTE — Assessment & Plan Note (Signed)
Chronic. Has completed treatment for Hep C.  No longer receiving therapy.

## 2022-01-13 NOTE — Assessment & Plan Note (Signed)
Labs ordered today.  Will make recommendations based on lab results. ?

## 2022-01-13 NOTE — Assessment & Plan Note (Signed)
Chronic.  Controlled.  Continue with current medication regimen of Effexor daily.  Labs ordered today.  Return to clinic in 6 months for reevaluation.  Call sooner if concerns arise.

## 2022-01-13 NOTE — Assessment & Plan Note (Signed)
Continue to follow up with Oncology for evaluation and treatment.

## 2022-01-13 NOTE — Progress Notes (Signed)
BP 138/68   Pulse 72   Temp 97.7 F (36.5 C) (Oral)   Wt 106 lb 12.8 oz (48.4 kg)   LMP  (LMP Unknown)   SpO2 96%   BMI 18.92 kg/m    Subjective:    Patient ID: Monique Tucker, female    DOB: [birth year], 61 y.o.   MRN: [id number]  HPI: Monique Tucker is a 61 y.o. female  Chief Complaint  Patient presents with   Hypertension    Patient would like a second shingles vaxx.    Hyperlipidemia   Diabetes    HYPERTENSION / HYPERLIPIDEMIA Satisfied with current treatment? yes Duration of hypertension: years BP monitoring frequency: not checking BP range:  BP medication side effects: no Past BP meds:  Metoprolol, amlodipine, and HCTZ Duration of hyperlipidemia: years Cholesterol medication side effects: no Cholesterol supplements: none Past cholesterol medications: none Medication compliance: good compliance Aspirin: no Recent stressors: no Recurrent headaches: no Visual changes: no Palpitations: no Dyspnea: no Chest pain: no Lower extremity edema: no Dizzy/lightheaded: no  MOOD Followed by Psychiatry.  She has been out of her Seroquel recently.  She is stressed about finances and her knee pain.  COPD COPD status: controlled Satisfied with current treatment?: no Oxygen use: no Dyspnea frequency:  Cough frequency: when she smokes and doesn't use her inhaler Rescue inhaler frequency:   Limitation of activity: no Productive cough:  Last Spirometry:  Pneumovax: Up to Date Influenza: Up to Date She smokes 1 cigarette per day and feels a lot better.   Patient has completed Hep C treatment.  She finished it last week.   Relevant past medical, surgical, family and social history reviewed and updated as indicated. Interim medical history since our last visit reviewed. Allergies and medications reviewed and updated.  Review of Systems  Eyes:  Negative for visual disturbance.  Respiratory:  Negative for cough, chest tightness and shortness of breath.    Cardiovascular:  Negative for chest pain, palpitations and leg swelling.  Neurological:  Negative for dizziness and headaches.  Psychiatric/Behavioral:  The patient is nervous/anxious.     Per HPI unless specifically indicated above     Objective:    BP 138/68   Pulse 72   Temp 97.7 F (36.5 C) (Oral)   Wt 106 lb 12.8 oz (48.4 kg)   LMP  (LMP Unknown)   SpO2 96%   BMI 18.92 kg/m   Wt Readings from Last 3 Encounters:  01/13/22 106 lb 12.8 oz (48.4 kg)  10/14/21 112 lb 12.8 oz (51.2 kg)  09/06/21 117 lb 6.4 oz (53.3 kg)    Physical Exam Vitals and nursing note reviewed.  Constitutional:      General: She is not in acute distress.    Appearance: Normal appearance. She is normal weight. She is not ill-appearing, toxic-appearing or diaphoretic.  HENT:     Head: Normocephalic.     Right Ear: External ear normal.     Left Ear: External ear normal.     Nose: Nose normal.     Mouth/Throat:     Mouth: Mucous membranes are moist.     Pharynx: Oropharynx is clear.  Eyes:     General:        Right eye: No discharge.        Left eye: No discharge.     Extraocular Movements: Extraocular movements intact.     Conjunctiva/sclera: Conjunctivae normal.     Pupils: Pupils are equal, round, and reactive to light.  Cardiovascular:     Rate and Rhythm: Normal rate and regular rhythm.     Heart sounds: No murmur heard. Pulmonary:     Effort: Pulmonary effort is normal. No respiratory distress.     Breath sounds: Normal breath sounds. No wheezing or rales.  Musculoskeletal:     Cervical back: Normal range of motion and neck supple.  Skin:    General: Skin is warm and dry.     Capillary Refill: Capillary refill takes less than 2 seconds.  Neurological:     General: No focal deficit present.     Mental Status: She is alert and oriented to person, place, and time. Mental status is at baseline.  Psychiatric:        Mood and Affect: Mood normal.        Behavior: Behavior normal.         Thought Content: Thought content normal.        Judgment: Judgment normal.     Results for orders placed or performed in visit on 08/67/61  Basic Metabolic Panel (BMET)  Result Value Ref Range   Glucose 79 70 - 99 mg/dL   BUN 13 8 - 27 mg/dL   Creatinine, Ser 0.66 0.57 - 1.00 mg/dL   eGFR 100 >59 mL/min/1.73   BUN/Creatinine Ratio 20 12 - 28   Sodium 140 134 - 144 mmol/L   Potassium 3.8 3.5 - 5.2 mmol/L   Chloride 105 96 - 106 mmol/L   CO2 23 20 - 29 mmol/L   Calcium 8.9 8.7 - 10.3 mg/dL      Assessment & Plan:   Problem List Items Addressed This Visit       Cardiovascular and Mediastinum   Hypertension    Chronic.  Controlled.  Continue with current medication regimen.  Labs ordered today.  Return to clinic in 6 months for reevaluation.  Call sooner if concerns arise.        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  Controlled.  Continue with current medication regimen.  Patient is only smoking 1 cigarette daily.  Praised for decreasing usage.  Labs ordered today.  Return to clinic in 6 months for reevaluation.  Call sooner if concerns arise.          Digestive   Other cirrhosis of liver (HCC)    Chronic.  Controlled.  Continue with current medication regimen.  Labs ordered today.  Return to clinic in 6 months for reevaluation.  Call sooner if concerns arise.        Relevant Orders   CBC w/Diff   Chronic hepatitis C without hepatic coma (HCC)    Chronic. Has completed treatment for Hep C.  No longer receiving therapy.       Relevant Orders   CBC w/Diff   Hepatocellular carcinoma ([place])    Continue to follow up with Oncology for evaluation and treatment.       Relevant Orders   CBC w/Diff     Other   Depression    Chronic.  Controlled.  Continue with current medication regimen of Effexor daily.   Labs ordered today.  Return to clinic in 6 months for reevaluation.  Call sooner if concerns arise.        Prediabetes    Labs ordered today. Will make  recommendations based on lab results.       Relevant Orders   HgB A1c   Hypercholesterolemia    Chronic.  Controlled.  Continue with current medication regimen.  Labs ordered today.  Return to clinic in 6 months for reevaluation.  Call sooner if concerns arise.        Relevant Medications   amLODipine (NORVASC) 5 MG tablet   Other Relevant Orders   Lipid Profile   Other Visit Diagnoses     Need for shingles vaccine       Relevant Orders   Varicella-zoster vaccine IM (Shingrix)        Follow up plan: Return in about 6 months (around 07/16/2022) for Physical and Fasting labs.

## 2022-01-14 NOTE — Progress Notes (Signed)
Please let patient know that her lab work looks good.  No concerns at this time.  A1c remains in the normal range which is great.  Continue with current medication regimen.  I'll see her at our next visit.

## 2022-01-15 LAB — LIPID PANEL
Chol/HDL Ratio: 4.1 ratio (ref 0.0–4.4)
Cholesterol, Total: 178 mg/dL (ref 100–199)
HDL: 43 mg/dL (ref >39)
LDL Chol Calc (NIH): 92 mg/dL (ref 0–99)
Triglycerides: 259 mg/dL — ABNORMAL HIGH (ref 0–149)
VLDL Cholesterol Cal: 43 mg/dL — ABNORMAL HIGH (ref 5–40)

## 2022-01-15 LAB — COMPREHENSIVE METABOLIC PANEL
ALT: 22 IU/L (ref 0–32)
AST: 33 IU/L (ref 0–40)
Albumin/Globulin Ratio: 1.7 (ref 1.2–2.2)
Albumin: 4.5 g/dL (ref 3.9–4.9)
Alkaline Phosphatase: 137 IU/L — ABNORMAL HIGH (ref 44–121)
BUN/Creatinine Ratio: 18 (ref 12–28)
BUN: 17 mg/dL (ref 8–27)
Bilirubin Total: 0.3 mg/dL (ref 0.0–1.2)
CO2: 26 mmol/L (ref 20–29)
Calcium: 9.4 mg/dL (ref 8.7–10.3)
Chloride: 101 mmol/L (ref 96–106)
Creatinine, Ser: 0.95 mg/dL (ref 0.57–1.00)
Globulin, Total: 2.6 g/dL (ref 1.5–4.5)
Glucose: 76 mg/dL (ref 70–99)
Potassium: 3.3 mmol/L — ABNORMAL LOW (ref 3.5–5.2)
Sodium: 141 mmol/L (ref 134–144)
Total Protein: 7.1 g/dL (ref 6.0–8.5)
eGFR: 68 mL/min/{1.73_m2} (ref >59)

## 2022-01-15 LAB — CBC WITH DIFFERENTIAL/PLATELET
Basophils Absolute: 0 10*3/uL (ref 0.0–0.2)
Basos: 1 %
EOS (ABSOLUTE): 0.1 10*3/uL (ref 0.0–0.4)
Eos: 3 %
Hematocrit: 36.9 % (ref 34.0–46.6)
Hemoglobin: 13.1 g/dL (ref 11.1–15.9)
Immature Grans (Abs): 0 10*3/uL (ref 0.0–0.1)
Immature Granulocytes: 0 %
Lymphocytes Absolute: 1.8 10*3/uL (ref 0.7–3.1)
Lymphs: 44 %
MCH: 34.1 pg — ABNORMAL HIGH (ref 26.6–33.0)
MCHC: 35.5 g/dL (ref 31.5–35.7)
MCV: 96 fL (ref 79–97)
Monocytes Absolute: 0.3 10*3/uL (ref 0.1–0.9)
Monocytes: 7 %
Neutrophils Absolute: 1.8 10*3/uL (ref 1.4–7.0)
Neutrophils: 45 %
Platelets: 134 10*3/uL — ABNORMAL LOW (ref 150–450)
RBC: 3.84 x10E6/uL (ref 3.77–5.28)
RDW: 12.4 % (ref 11.7–15.4)
WBC: 4.1 10*3/uL (ref 3.4–10.8)

## 2022-01-15 LAB — HEMOGLOBIN A1C
Est. average glucose Bld gHb Est-mCnc: 114 mg/dL
Hgb A1c MFr Bld: 5.6 % (ref 4.8–5.6)

## 2022-01-25 ENCOUNTER — Ambulatory Visit: Admit: 2022-01-25 | Discharge: 2022-01-25 | Payer: PRIVATE HEALTH INSURANCE

## 2022-01-28 ENCOUNTER — Ambulatory Visit: Admit: 2022-01-28 | Discharge: 2022-01-28 | Payer: PRIVATE HEALTH INSURANCE

## 2022-01-28 ENCOUNTER — Other Ambulatory Visit: Admit: 2022-01-28 | Discharge: 2022-01-28 | Payer: PRIVATE HEALTH INSURANCE

## 2022-01-28 DIAGNOSIS — C220 Liver cell carcinoma: Principal | ICD-10-CM

## 2022-02-04 ENCOUNTER — Other Ambulatory Visit: Payer: Self-pay | Admitting: Nurse Practitioner

## 2022-02-04 NOTE — Telephone Encounter (Signed)
Requested Prescriptions  Pending Prescriptions Disp Refills  . hydrochlorothiazide (HYDRODIURIL) 25 MG tablet [Pharmacy Med Name: HYDROCHLOROTHIAZIDE 25 MG TAB] 90 tablet 1    Sig: TAKE 1 TABLET BY MOUTH ONCE A DAY     Cardiovascular: Diuretics - Thiazide Failed - 02/04/2022 12:24 PM      Failed - K in normal range and within 180 days    Potassium  Date Value Ref Range Status  01/13/2022 3.3 (L) 3.5 - 5.2 mmol/L Final         Passed - Cr in normal range and within 180 days    Creatinine, Ser  Date Value Ref Range Status  01/13/2022 0.95 0.57 - 1.00 mg/dL Final         Passed - Na in normal range and within 180 days    Sodium  Date Value Ref Range Status  01/13/2022 141 134 - 144 mmol/L Final         Passed - Last BP in normal range    BP Readings from Last 1 Encounters:  01/13/22 138/68         Passed - Valid encounter within last 6 months    Recent Outpatient Visits          3 weeks ago Moderate COPD (chronic obstructive pulmonary disease) ([place])   [place] Jon Billings, NP   3 months ago Primary hypertension   [facility] Jon Billings, NP   5 months ago Primary hypertension   [facility] Jon Billings, NP   6 months ago Acute non-recurrent frontal sinusitis   [facility] Jon Billings, NP   9 months ago Moderate COPD (chronic obstructive pulmonary disease) ([place])   [place] Jon Billings, NP      Future Appointments            In 5 months Jon Billings, NP [facility], [place]

## 2022-02-04 NOTE — Telephone Encounter (Signed)
Requested Prescriptions  Pending Prescriptions Disp Refills  . potassium chloride SA (KLOR-CON M) 20 MEQ tablet [Pharmacy Med Name: POTASSIUM CHLORIDE CRYS ER 20 MEQ T] 90 tablet 1    Sig: TAKE 1 TABLET BY MOUTH ONCE A DAY     Endocrinology:  Minerals - Potassium Supplementation Failed - 02/04/2022 10:06 AM      Failed - K in normal range and within 360 days    Potassium  Date Value Ref Range Status  01/13/2022 3.3 (L) 3.5 - 5.2 mmol/L Final         Passed - Cr in normal range and within 360 days    Creatinine, Ser  Date Value Ref Range Status  01/13/2022 0.95 0.57 - 1.00 mg/dL Final         Passed - Valid encounter within last 12 months    Recent Outpatient Visits          3 weeks ago Moderate COPD (chronic obstructive pulmonary disease) ([place])   [place], Karen, NP   3 months ago Primary hypertension   [facility] Jon Billings, NP   5 months ago Primary hypertension   [facility] Jon Billings, NP   6 months ago Acute non-recurrent frontal sinusitis   [place], NP   9 months ago Moderate COPD (chronic obstructive pulmonary disease) ([place])   [place] Jon Billings, NP      Future Appointments            In 5 months Jon Billings, NP [facility], [place]

## 2022-03-02 NOTE — Progress Notes (Signed)
BP 127/65   Pulse 77   Temp 98.4 F (36.9 C) (Oral)   Wt 105 lb 4.8 oz (47.8 kg)   LMP  (LMP Unknown)   SpO2 98%   BMI 18.65 kg/m    Subjective:    Patient ID: Monique Tucker, female    DOB: Feb 12, 1961, 61 y.o.   MRN: [id number]  HPI: Monique Tucker is a 61 y.o. female  Chief Complaint  Patient presents with   Knee Pain    R knee pain, onset 3-4 weeks ago. Denies recent fall/injury. Patient is wearing brace from home.   KNEE PAIN Duration: months Involved knee: right Mechanism of injury: unknown Location:posterior Onset: gradual Severity: 7/10  Quality:  aching Frequency: constant Radiation: no Aggravating factors: bending  Alleviating factors: brace  Status: worse Treatments attempted: rest  Relief with NSAIDs?:  No NSAIDs Taken Weakness with weight bearing or walking: no Sensation of giving way: no Locking: no Popping: no Bruising: no Swelling: no Redness: no Paresthesias/decreased sensation: no Fevers: no  DEPRESSION Patient states she has been struggling lately.  She has been having trouble with work lately.  She is very angry.  States she having to use her xanax more than normal.     Relevant past medical, surgical, family and social history reviewed and updated as indicated. Interim medical history since our last visit reviewed. Allergies and medications reviewed and updated.  Review of Systems  Musculoskeletal:        Right knee pain  Psychiatric/Behavioral:  Positive for dysphoric mood. Negative for suicidal ideas. The patient is nervous/anxious.     Per HPI unless specifically indicated above     Objective:    BP 127/65   Pulse 77   Temp 98.4 F (36.9 C) (Oral)   Wt 105 lb 4.8 oz (47.8 kg)   LMP  (LMP Unknown)   SpO2 98%   BMI 18.65 kg/m   Wt Readings from Last 3 Encounters:  03/03/22 105 lb 4.8 oz (47.8 kg)  01/13/22 106 lb 12.8 oz (48.4 kg)  10/14/21 112 lb 12.8 oz (51.2 kg)    Physical Exam Vitals and nursing note  reviewed.  Constitutional:      General: She is not in acute distress.    Appearance: Normal appearance. She is normal weight. She is not ill-appearing, toxic-appearing or diaphoretic.  HENT:     Head: Normocephalic.     Right Ear: External ear normal.     Left Ear: External ear normal.     Nose: Nose normal.     Mouth/Throat:     Mouth: Mucous membranes are moist.     Pharynx: Oropharynx is clear.  Eyes:     General:        Right eye: No discharge.        Left eye: No discharge.     Extraocular Movements: Extraocular movements intact.     Conjunctiva/sclera: Conjunctivae normal.     Pupils: Pupils are equal, round, and reactive to light.  Cardiovascular:     Rate and Rhythm: Normal rate and regular rhythm.     Heart sounds: No murmur heard. Pulmonary:     Effort: Pulmonary effort is normal. No respiratory distress.     Breath sounds: Normal breath sounds. No wheezing or rales.  Musculoskeletal:     Cervical back: Normal range of motion and neck supple.     Right knee: No swelling. Normal range of motion. No tenderness.  Skin:    General:  Skin is warm and dry.     Capillary Refill: Capillary refill takes less than 2 seconds.  Neurological:     General: No focal deficit present.     Mental Status: She is alert and oriented to person, place, and time. Mental status is at baseline.  Psychiatric:        Mood and Affect: Mood normal.        Behavior: Behavior normal.        Thought Content: Thought content normal.        Judgment: Judgment normal.     Results for orders placed or performed in visit on 01/13/22  Comp Met (CMET)  Result Value Ref Range   Glucose 76 70 - 99 mg/dL   BUN 17 8 - 27 mg/dL   Creatinine, Ser 0.95 0.57 - 1.00 mg/dL   eGFR 68 >59 mL/min/1.73   BUN/Creatinine Ratio 18 12 - 28   Sodium 141 134 - 144 mmol/L   Potassium 3.3 (L) 3.5 - 5.2 mmol/L   Chloride 101 96 - 106 mmol/L   CO2 26 20 - 29 mmol/L   Calcium 9.4 8.7 - 10.3 mg/dL   Total Protein 7.1  6.0 - 8.5 g/dL   Albumin 4.5 3.9 - 4.9 g/dL   Globulin, Total 2.6 1.5 - 4.5 g/dL   Albumin/Globulin Ratio 1.7 1.2 - 2.2   Bilirubin Total 0.3 0.0 - 1.2 mg/dL   Alkaline Phosphatase 137 (H) 44 - 121 IU/L   AST 33 0 - 40 IU/L   ALT 22 0 - 32 IU/L  HgB A1c  Result Value Ref Range   Hgb A1c MFr Bld 5.6 4.8 - 5.6 %   Est. average glucose Bld gHb Est-mCnc 114 mg/dL  Lipid Profile  Result Value Ref Range   Cholesterol, Total 178 100 - 199 mg/dL   Triglycerides 259 (H) 0 - 149 mg/dL   HDL 43 >39 mg/dL   VLDL Cholesterol Cal 43 (H) 5 - 40 mg/dL   LDL Chol Calc (NIH) 92 0 - 99 mg/dL   Chol/HDL Ratio 4.1 0.0 - 4.4 ratio  CBC w/Diff  Result Value Ref Range   WBC 4.1 3.4 - 10.8 x10E3/uL   RBC 3.84 3.77 - 5.28 x10E6/uL   Hemoglobin 13.1 11.1 - 15.9 g/dL   Hematocrit 36.9 34.0 - 46.6 %   MCV 96 79 - 97 fL   MCH 34.1 (H) 26.6 - 33.0 pg   MCHC 35.5 31.5 - 35.7 g/dL   RDW 12.4 11.7 - 15.4 %   Platelets 134 (L) 150 - 450 x10E3/uL   Neutrophils 45 Not Estab. %   Lymphs 44 Not Estab. %   Monocytes 7 Not Estab. %   Eos 3 Not Estab. %   Basos 1 Not Estab. %   Neutrophils Absolute 1.8 1.4 - 7.0 x10E3/uL   Lymphocytes Absolute 1.8 0.7 - 3.1 x10E3/uL   Monocytes Absolute 0.3 0.1 - 0.9 x10E3/uL   EOS (ABSOLUTE) 0.1 0.0 - 0.4 x10E3/uL   Basophils Absolute 0.0 0.0 - 0.2 x10E3/uL   Immature Granulocytes 0 Not Estab. %   Immature Grans (Abs) 0.0 0.0 - 0.1 x10E3/uL      Assessment & Plan:   Problem List Items Addressed This Visit       Other   Depression    Chronic. Not well controlled.  Will increase Effexor to $RemoveBe'225mg'dhiBPINmc$  daily.  She does see psychiatry who changed her Seroquel.  She does have an appointment coming up with psychiatrist.  Follow up in 1 month for reevaluation.       Relevant Medications   venlafaxine XR (EFFEXOR XR) 75 MG 24 hr capsule   Other Visit Diagnoses     Chronic pain of right knee    -  Primary   Will order xray of knee for evaluation.  Will make recommendations  based on imagiang results.   Relevant Orders   DG Knee Complete 4 Views Right        Follow up plan: Return in about 1 month (around 04/02/2022) for Depression/Anxiety FU.

## 2022-03-03 ENCOUNTER — Encounter: Payer: Self-pay | Admitting: Nurse Practitioner

## 2022-03-03 ENCOUNTER — Ambulatory Visit: Payer: PRIVATE HEALTH INSURANCE | Admitting: Nurse Practitioner

## 2022-03-03 VITALS — BP 127/65 | HR 77 | Temp 98.4°F | Wt 105.3 lb

## 2022-03-03 DIAGNOSIS — M25561 Pain in right knee: Secondary | ICD-10-CM | POA: Diagnosis not present

## 2022-03-03 DIAGNOSIS — G8929 Other chronic pain: Secondary | ICD-10-CM

## 2022-03-03 DIAGNOSIS — F3341 Major depressive disorder, recurrent, in partial remission: Secondary | ICD-10-CM

## 2022-03-03 MED ORDER — VENLAFAXINE HCL ER 75 MG PO CP24: 75.0000 mg | ORAL_CAPSULE | Freq: Every day | ORAL | 1 refills | Status: DC

## 2022-03-03 NOTE — Assessment & Plan Note (Signed)
Chronic. Not well controlled.  Will increase Effexor to '225mg'$  daily.  She does see psychiatry who changed her Seroquel.  She does have an appointment coming up with psychiatrist.  Follow up in 1 month for reevaluation.

## 2022-03-04 ENCOUNTER — Other Ambulatory Visit: Payer: Self-pay | Admitting: Nurse Practitioner

## 2022-03-07 ENCOUNTER — Ambulatory Visit: Admit: 2022-03-07 | Discharge: 2022-03-07 | Payer: PRIVATE HEALTH INSURANCE

## 2022-03-07 DIAGNOSIS — C220 Liver cell carcinoma: Principal | ICD-10-CM

## 2022-03-07 MED ORDER — LEVOFLOXACIN 500 MG TABLET
ORAL_TABLET | ORAL | 0 refills | 7 days | Status: CP
Start: 2022-03-07 — End: 2022-03-14
  Filled 2022-03-07: qty 7, 7d supply, fill #0

## 2022-03-07 MED ORDER — OXYCODONE 5 MG TABLET
ORAL_TABLET | Freq: Four times a day (QID) | ORAL | 0 refills | 3 days | Status: CP | PRN
Start: 2022-03-07 — End: 2022-03-12
  Filled 2022-03-07: qty 10, 3d supply, fill #0

## 2022-03-07 NOTE — Telephone Encounter (Signed)
Requested Prescriptions  Pending Prescriptions Disp Refills  . gabapentin (NEURONTIN) 300 MG capsule [Pharmacy Med Name: GABAPENTIN 300 MG CAP] 270 capsule 1    Sig: TAKE 1 CAPSULE BY MOUTH 3 TIMES A DAY     Neurology: Anticonvulsants - gabapentin Passed - 03/04/2022  2:07 PM      Passed - Cr in normal range and within 360 days    Creatinine, Ser  Date Value Ref Range Status  01/13/2022 0.95 0.57 - 1.00 mg/dL Final         Passed - Completed PHQ-2 or PHQ-9 in the last 360 days      Passed - Valid encounter within last 12 months    Recent Outpatient Visits          4 days ago Chronic pain of right knee   [facility] Jon Billings, NP   1 month ago Moderate COPD (chronic obstructive pulmonary disease) ([place])   [place], Karen, NP   4 months ago Primary hypertension   [facility] Jon Billings, NP   6 months ago Primary hypertension   [facility] Jon Billings, NP   7 months ago Acute non-recurrent frontal sinusitis   [facility] Jon Billings, NP      Future Appointments            In 4 weeks Jon Billings, NP [facility], [place]   In 4 months Jon Billings, NP [facility], [place]

## 2022-04-04 NOTE — Progress Notes (Deleted)
LMP  (LMP Unknown)    Subjective:    Patient ID: Monique Tucker, female    DOB: [birth year], 61 y.o.   MRN: [id number]  HPI: Monique Tucker is a 61 y.o. female  No chief complaint on file.   DEPRESSION Patient states she has been struggling lately.  She has been having trouble with work lately.  She is very angry.  States she having to use her xanax more than normal.     Relevant past medical, surgical, family and social history reviewed and updated as indicated. Interim medical history since our last visit reviewed. Allergies and medications reviewed and updated.  Review of Systems  Musculoskeletal:        Right knee pain  Psychiatric/Behavioral:  Positive for dysphoric mood. Negative for suicidal ideas. The patient is nervous/anxious.     Per HPI unless specifically indicated above     Objective:    LMP  (LMP Unknown)   Wt Readings from Last 3 Encounters:  03/03/22 105 lb 4.8 oz (47.8 kg)  01/13/22 106 lb 12.8 oz (48.4 kg)  10/14/21 112 lb 12.8 oz (51.2 kg)    Physical Exam Vitals and nursing note reviewed.  Constitutional:      General: She is not in acute distress.    Appearance: Normal appearance. She is normal weight. She is not ill-appearing, toxic-appearing or diaphoretic.  HENT:     Head: Normocephalic.     Right Ear: External ear normal.     Left Ear: External ear normal.     Nose: Nose normal.     Mouth/Throat:     Mouth: Mucous membranes are moist.     Pharynx: Oropharynx is clear.  Eyes:     General:        Right eye: No discharge.        Left eye: No discharge.     Extraocular Movements: Extraocular movements intact.     Conjunctiva/sclera: Conjunctivae normal.     Pupils: Pupils are equal, round, and reactive to light.  Cardiovascular:     Rate and Rhythm: Normal rate and regular rhythm.     Heart sounds: No murmur heard. Pulmonary:     Effort: Pulmonary effort is normal. No respiratory distress.     Breath sounds: Normal breath sounds.  No wheezing or rales.  Musculoskeletal:     Cervical back: Normal range of motion and neck supple.     Right knee: No swelling. Normal range of motion. No tenderness.  Skin:    General: Skin is warm and dry.     Capillary Refill: Capillary refill takes less than 2 seconds.  Neurological:     General: No focal deficit present.     Mental Status: She is alert and oriented to person, place, and time. Mental status is at baseline.  Psychiatric:        Mood and Affect: Mood normal.        Behavior: Behavior normal.        Thought Content: Thought content normal.        Judgment: Judgment normal.     Results for orders placed or performed in visit on 01/13/22  Comp Met (CMET)  Result Value Ref Range   Glucose 76 70 - 99 mg/dL   BUN 17 8 - 27 mg/dL   Creatinine, Ser 0.95 0.57 - 1.00 mg/dL   eGFR 68 >59 mL/min/1.73   BUN/Creatinine Ratio 18 12 - 28   Sodium 141 134 - 144  mmol/L   Potassium 3.3 (L) 3.5 - 5.2 mmol/L   Chloride 101 96 - 106 mmol/L   CO2 26 20 - 29 mmol/L   Calcium 9.4 8.7 - 10.3 mg/dL   Total Protein 7.1 6.0 - 8.5 g/dL   Albumin 4.5 3.9 - 4.9 g/dL   Globulin, Total 2.6 1.5 - 4.5 g/dL   Albumin/Globulin Ratio 1.7 1.2 - 2.2   Bilirubin Total 0.3 0.0 - 1.2 mg/dL   Alkaline Phosphatase 137 (H) 44 - 121 IU/L   AST 33 0 - 40 IU/L   ALT 22 0 - 32 IU/L  HgB A1c  Result Value Ref Range   Hgb A1c MFr Bld 5.6 4.8 - 5.6 %   Est. average glucose Bld gHb Est-mCnc 114 mg/dL  Lipid Profile  Result Value Ref Range   Cholesterol, Total 178 100 - 199 mg/dL   Triglycerides 259 (H) 0 - 149 mg/dL   HDL 43 >39 mg/dL   VLDL Cholesterol Cal 43 (H) 5 - 40 mg/dL   LDL Chol Calc (NIH) 92 0 - 99 mg/dL   Chol/HDL Ratio 4.1 0.0 - 4.4 ratio  CBC w/Diff  Result Value Ref Range   WBC 4.1 3.4 - 10.8 x10E3/uL   RBC 3.84 3.77 - 5.28 x10E6/uL   Hemoglobin 13.1 11.1 - 15.9 g/dL   Hematocrit 36.9 34.0 - 46.6 %   MCV 96 79 - 97 fL   MCH 34.1 (H) 26.6 - 33.0 pg   MCHC 35.5 31.5 - 35.7 g/dL    RDW 12.4 11.7 - 15.4 %   Platelets 134 (L) 150 - 450 x10E3/uL   Neutrophils 45 Not Estab. %   Lymphs 44 Not Estab. %   Monocytes 7 Not Estab. %   Eos 3 Not Estab. %   Basos 1 Not Estab. %   Neutrophils Absolute 1.8 1.4 - 7.0 x10E3/uL   Lymphocytes Absolute 1.8 0.7 - 3.1 x10E3/uL   Monocytes Absolute 0.3 0.1 - 0.9 x10E3/uL   EOS (ABSOLUTE) 0.1 0.0 - 0.4 x10E3/uL   Basophils Absolute 0.0 0.0 - 0.2 x10E3/uL   Immature Granulocytes 0 Not Estab. %   Immature Grans (Abs) 0.0 0.0 - 0.1 x10E3/uL      Assessment & Plan:   Problem List Items Addressed This Visit       Other   Bipolar 1 disorder ([place]) - Primary   Depression   Anxiety     Follow up plan: No follow-ups on file.

## 2022-04-05 ENCOUNTER — Ambulatory Visit: Payer: PRIVATE HEALTH INSURANCE | Admitting: Nurse Practitioner

## 2022-04-08 ENCOUNTER — Encounter: Payer: Self-pay | Admitting: Nurse Practitioner

## 2022-04-08 ENCOUNTER — Telehealth (INDEPENDENT_AMBULATORY_CARE_PROVIDER_SITE_OTHER): Payer: PRIVATE HEALTH INSURANCE | Admitting: Nurse Practitioner

## 2022-04-08 DIAGNOSIS — J441 Chronic obstructive pulmonary disease with (acute) exacerbation: Secondary | ICD-10-CM

## 2022-04-08 MED ORDER — FLUTICASONE-SALMETEROL 250-50 MCG/ACT IN AEPB: INHALATION_SPRAY | RESPIRATORY_TRACT | 2 refills | Status: DC

## 2022-04-08 MED ORDER — SPIRIVA RESPIMAT 2.5 MCG/ACT IN AERS
INHALATION_SPRAY | RESPIRATORY_TRACT | 11 refills | Status: DC
Start: 2022-04-08 — End: 2022-07-18

## 2022-04-08 MED ORDER — FLUCONAZOLE 150 MG PO TABS: 150.0000 mg | ORAL_TABLET | Freq: Once | ORAL | 0 refills | Status: AC

## 2022-04-08 MED ORDER — MONTELUKAST SODIUM 10 MG PO TABS: 10.0000 mg | ORAL_TABLET | Freq: Every day | ORAL | 1 refills | Status: DC

## 2022-04-08 MED ORDER — PREDNISONE 20 MG PO TABS: 40.0000 mg | ORAL_TABLET | Freq: Every day | ORAL | 0 refills | Status: AC

## 2022-04-08 MED ORDER — AZITHROMYCIN 250 MG PO TABS: ORAL_TABLET | ORAL | 0 refills | Status: AC

## 2022-04-08 NOTE — Patient Instructions (Signed)

## 2022-04-08 NOTE — Progress Notes (Signed)
LMP  (LMP Unknown)    Subjective:    Patient ID: Monique Tucker, female    DOB: December 06, 1960, 61 y.o.   MRN: [id number]  HPI: Monique Tucker is a 61 y.o. female  Chief Complaint  Patient presents with   Ear Pain   Cough   Sore Throat    Patient says she became symptomatic about Tuesday with coughing and sore throat. Patient says she the only thing she has tried over the counter BC sinus and congestion. Patient says she feels as if her throat is getting better. Patient denies having any fever and chills.    Congestion   Facial Pain   This visit was completed via telephone due to the restrictions of the COVID-19 pandemic. All issues as above were discussed and addressed but no physical exam was performed. If it was felt that the patient should be evaluated in the office, they were directed there. The patient verbally consented to this visit. Patient was unable to complete an audio/visual visit due to Technical difficulties", "Lack of internet. Due to the catastrophic nature of the COVID-19 pandemic, this visit was done through audio contact only. Location of the patient: home Location of the provider: work Those involved with this call:  Provider: Marnee Guarneri, DNP CMA: Irena Reichmann, [place] Desk/Registration: [employer]  Time spent on call:  21 minutes on the phone discussing health concerns. 15 minutes total spent in review of patient's record and preparation of their chart.  I verified patient identity using two factors (patient name and date of birth). Patient consents verbally to being seen via telemedicine visit today.    UPPER RESPIRATORY TRACT INFECTION Started feeling bad on Tuesday (04/05/22), started with sore throat and coughing -- then progressed in pressure in face and ear pain.  Has had some Covid vaccinations.  No Covid testing at home.  Has underlying COPD and is out of inhalers.  Is a smoker.  Currently has liver CA. Fever: no Cough: yes Shortness of breath:  no Wheezing: no Chest pain: no Chest tightness: no Chest congestion: yes Nasal congestion: yes Runny nose: yes Post nasal drip: yes Sneezing: no Sore throat: yes Swollen glands: no Sinus pressure: yes Headache: yes Face pain: yes Toothache: no Ear pain: yes bilateral Ear pressure: yes bilateral Eyes red/itching:no Eye drainage/crusting: no  Vomiting: a little nausea recently Rash: no Fatigue: yes Sick contacts: no Strep contacts: no  Context: stable Recurrent sinusitis: no Relief with OTC cold/cough medications: no  Treatments attempted: Singulair, Zyrtec  Relevant past medical, surgical, family and social history reviewed and updated as indicated. Interim medical history since our last visit reviewed. Allergies and medications reviewed and updated.  Review of Systems  Constitutional:  Positive for fatigue. Negative for activity change, appetite change, chills and fever.  HENT:  Positive for congestion, postnasal drip, rhinorrhea, sinus pressure, sinus pain and sore throat. Negative for ear discharge, ear pain, facial swelling, sneezing and voice change.   Eyes:  Negative for pain and visual disturbance.  Respiratory:  Positive for cough and chest tightness. Negative for shortness of breath and wheezing.   Cardiovascular:  Negative for chest pain, palpitations and leg swelling.  Gastrointestinal:  Positive for nausea. Negative for constipation, diarrhea and vomiting.  Neurological:  Positive for headaches. Negative for dizziness and numbness.  Psychiatric/Behavioral: Negative.     Per HPI unless specifically indicated above     Objective:    LMP  (LMP Unknown)   Wt Readings from Last  3 Encounters:  03/03/22 105 lb 4.8 oz (47.8 kg)  01/13/22 106 lb 12.8 oz (48.4 kg)  10/14/21 112 lb 12.8 oz (51.2 kg)    Physical Exam  Unable to assess due to telephone visit only.  Results for orders placed or performed in visit on 01/13/22  Comp Met (CMET)  Result Value Ref  Range   Glucose 76 70 - 99 mg/dL   BUN 17 8 - 27 mg/dL   Creatinine, Ser 0.95 0.57 - 1.00 mg/dL   eGFR 68 >59 mL/min/1.73   BUN/Creatinine Ratio 18 12 - 28   Sodium 141 134 - 144 mmol/L   Potassium 3.3 (L) 3.5 - 5.2 mmol/L   Chloride 101 96 - 106 mmol/L   CO2 26 20 - 29 mmol/L   Calcium 9.4 8.7 - 10.3 mg/dL   Total Protein 7.1 6.0 - 8.5 g/dL   Albumin 4.5 3.9 - 4.9 g/dL   Globulin, Total 2.6 1.5 - 4.5 g/dL   Albumin/Globulin Ratio 1.7 1.2 - 2.2   Bilirubin Total 0.3 0.0 - 1.2 mg/dL   Alkaline Phosphatase 137 (H) 44 - 121 IU/L   AST 33 0 - 40 IU/L   ALT 22 0 - 32 IU/L  HgB A1c  Result Value Ref Range   Hgb A1c MFr Bld 5.6 4.8 - 5.6 %   Est. average glucose Bld gHb Est-mCnc 114 mg/dL  Lipid Profile  Result Value Ref Range   Cholesterol, Total 178 100 - 199 mg/dL   Triglycerides 259 (H) 0 - 149 mg/dL   HDL 43 >39 mg/dL   VLDL Cholesterol Cal 43 (H) 5 - 40 mg/dL   LDL Chol Calc (NIH) 92 0 - 99 mg/dL   Chol/HDL Ratio 4.1 0.0 - 4.4 ratio  CBC w/Diff  Result Value Ref Range   WBC 4.1 3.4 - 10.8 x10E3/uL   RBC 3.84 3.77 - 5.28 x10E6/uL   Hemoglobin 13.1 11.1 - 15.9 g/dL   Hematocrit 36.9 34.0 - 46.6 %   MCV 96 79 - 97 fL   MCH 34.1 (H) 26.6 - 33.0 pg   MCHC 35.5 31.5 - 35.7 g/dL   RDW 12.4 11.7 - 15.4 %   Platelets 134 (L) 150 - 450 x10E3/uL   Neutrophils 45 Not Estab. %   Lymphs 44 Not Estab. %   Monocytes 7 Not Estab. %   Eos 3 Not Estab. %   Basos 1 Not Estab. %   Neutrophils Absolute 1.8 1.4 - 7.0 x10E3/uL   Lymphocytes Absolute 1.8 0.7 - 3.1 x10E3/uL   Monocytes Absolute 0.3 0.1 - 0.9 x10E3/uL   EOS (ABSOLUTE) 0.1 0.0 - 0.4 x10E3/uL   Basophils Absolute 0.0 0.0 - 0.2 x10E3/uL   Immature Granulocytes 0 Not Estab. %   Immature Grans (Abs) 0.0 0.0 - 0.1 x10E3/uL      Assessment & Plan:   Problem List Items Addressed This Visit       Respiratory   COPD exacerbation ([place])    Acute for 3 days -- she is not able to get to office for Covid or flu testing.   Recommend she Covid test at home and alert provider to results.  At this time she is higher risk with COPD and cancer -- will send in Azithromycin, Prednisone 40 MG daily x 5 days, and Diflucan as prophylactic to prevent yeast infection with abx.  Refills on inhalers sent as she is out and recommend she use them as ordered.  Recommend: - Increased rest -  Increasing Fluids - Acetaminophen as needed for fever/pain.  - Salt water gargling, chloraseptic spray and throat lozenges - OTC Coricidin - Mucinex.  - Humidifying the air. Return to office if worsening or ongoing symptoms.      Relevant Medications   Tiotropium Bromide Monohydrate (SPIRIVA RESPIMAT) 2.5 MCG/ACT AERS   fluticasone-salmeterol (ADVAIR) 250-50 MCG/ACT AEPB   montelukast (SINGULAIR) 10 MG tablet   azithromycin (ZITHROMAX) 250 MG tablet   predniSONE (DELTASONE) 20 MG tablet    I discussed the assessment and treatment plan with the patient. The patient was provided an opportunity to ask questions and all were answered. The patient agreed with the plan and demonstrated an understanding of the instructions.   The patient was advised to call back or seek an in-person evaluation if the symptoms worsen or if the condition fails to improve as anticipated.   I provided 21+ minutes of time during this encounter.    Follow up plan: Return if symptoms worsen or fail to improve.

## 2022-04-08 NOTE — Assessment & Plan Note (Signed)
Acute for 3 days -- she is not able to get to office for Covid or flu testing.  Recommend she Covid test at home and alert provider to results.  At this time she is higher risk with COPD and cancer -- will send in Azithromycin, Prednisone 40 MG daily x 5 days, and Diflucan as prophylactic to prevent yeast infection with abx.  Refills on inhalers sent as she is out and recommend she use them as ordered.  Recommend: - Increased rest - Increasing Fluids - Acetaminophen as needed for fever/pain.  - Salt water gargling, chloraseptic spray and throat lozenges - OTC Coricidin - Mucinex.  - Humidifying the air. Return to office if worsening or ongoing symptoms.

## 2022-04-11 ENCOUNTER — Telehealth: Payer: PRIVATE HEALTH INSURANCE | Admitting: Physician Assistant

## 2022-05-13 ENCOUNTER — Ambulatory Visit: Admit: 2022-05-13

## 2022-05-26 DIAGNOSIS — B182 Chronic viral hepatitis C: Principal | ICD-10-CM

## 2022-06-14 ENCOUNTER — Other Ambulatory Visit: Payer: Self-pay | Admitting: Nurse Practitioner

## 2022-06-14 NOTE — Telephone Encounter (Signed)
Requested medication (s) are due for refill today: routing for review  Requested medication (s) are on the active medication list: yes  Last refill: unknown   Future visit scheduled: yes  Notes to clinic:  Unable to refill per protocol, last refill by another provider. Historical medication, routing for review     Requested Prescriptions  Pending Prescriptions Disp Refills   amLODipine (NORVASC) 5 MG tablet [Pharmacy Med Name: AMLODIPINE BESYLATE 5 MG TAB] 90 tablet     Sig: TAKE 1 TABLET BY MOUTH ONCE A DAY. TO TAKE WITH 2.5 MG TABLET     Cardiovascular: Calcium Channel Blockers 2 Passed - 06/14/2022  9:12 AM      Passed - Last BP in normal range    BP Readings from Last 1 Encounters:  03/03/22 127/65         Passed - Last Heart Rate in normal range    Pulse Readings from Last 1 Encounters:  03/03/22 77         Passed - Valid encounter within last 6 months    Recent Outpatient Visits           2 months ago COPD exacerbation ([place])   [place], [place] T, NP   3 months ago Chronic pain of right knee   [facility] Jon Billings, NP   5 months ago Moderate COPD (chronic obstructive pulmonary disease) ([place])   [place], Karen, NP   8 months ago Primary hypertension   [facility] Jon Billings, NP   9 months ago Primary hypertension   [facility] Jon Billings, NP       Future Appointments             In 1 month Jon Billings, NP [facility], [place]

## 2022-06-15 ENCOUNTER — Other Ambulatory Visit: Payer: Self-pay | Admitting: Nurse Practitioner

## 2022-06-15 NOTE — Telephone Encounter (Signed)
Called and spoke with patient. She states she is only taking the 5 mg now and that the 2.5 mg was stopped.

## 2022-06-15 NOTE — Telephone Encounter (Signed)
Pt called back and states she was mistaken and is still taking both '5mg'$  and 2.'5mg'$  of   amLODipine (NORVASC)

## 2022-06-15 NOTE — Telephone Encounter (Signed)
Unable to refill per protocol, last refill by  provider 06/15/22 for 90 days. Will refuse.  Requested Prescriptions  Pending Prescriptions Disp Refills   amLODipine (NORVASC) 2.5 MG tablet [Pharmacy Med Name: AMLODIPINE BESYLATE 2.5 MG TAB] 90 tablet 1    Sig: TAKE 1 TABLET BY MOUTH ONCE A DAY. TO TAKE IN CONJUNCTION WITH FIVE MG TABLET     Cardiovascular: Calcium Channel Blockers 2 Passed - 06/15/2022  1:26 PM      Passed - Last BP in normal range    BP Readings from Last 1 Encounters:  03/03/22 127/65         Passed - Last Heart Rate in normal range    Pulse Readings from Last 1 Encounters:  03/03/22 77         Passed - Valid encounter within last 6 months    Recent Outpatient Visits           2 months ago COPD exacerbation ([place])   [place], [place] T, NP   3 months ago Chronic pain of right knee   [facility] Jon Billings, NP   5 months ago Moderate COPD (chronic obstructive pulmonary disease) ([place])   [place], NP   8 months ago Primary hypertension   [facility] Jon Billings, NP   9 months ago Primary hypertension   [facility] Jon Billings, NP       Future Appointments             In 1 month Jon Billings, NP [facility], [place]

## 2022-06-15 NOTE — Telephone Encounter (Signed)
Please clarify patient's dose.  We have that she is taking '5mg'$  of amlodipine.

## 2022-07-11 ENCOUNTER — Other Ambulatory Visit: Payer: Self-pay | Admitting: Nurse Practitioner

## 2022-07-11 NOTE — Telephone Encounter (Signed)
Requested medication (s) are due for refill today: yes  Requested medication (s) are on the active medication list: yes  Last refill:  01/13/22 #90 1 RF  Future visit scheduled: yes  Notes to clinic:  med not delegated to NT to RF   Requested Prescriptions  Pending Prescriptions Disp Refills   methocarbamol (ROBAXIN) 500 MG tablet [Pharmacy Med Name: METHOCARBAMOL 500 MG TAB] 90 tablet 1    Sig: TAKE 1 TABLET BY MOUTH 4 TIMES A DAY     Not Delegated - Analgesics:  Muscle Relaxants Failed - 07/11/2022  9:47 AM      Failed - This refill cannot be delegated      Passed - Valid encounter within last 6 months    Recent Outpatient Visits           3 months ago COPD exacerbation ([place])   [place], [place] T, NP   4 months ago Chronic pain of right knee   [facility] Jon Billings, NP   5 months ago Moderate COPD (chronic obstructive pulmonary disease) ([place])   [place], Karen, NP   9 months ago Primary hypertension   [facility] Jon Billings, NP   10 months ago Primary hypertension   [facility] Jon Billings, NP       Future Appointments             In 1 week Jon Billings, NP [facility], [place]

## 2022-07-15 NOTE — Progress Notes (Signed)
BP 137/74 (BP Location: Right Arm, Cuff Size: Small)   Pulse 64   Temp 97.8 F (36.6 C) (Oral)   Ht '5\' 4"'$  (1.626 m)   Wt 108 lb (49 kg)   LMP  (LMP Unknown)   SpO2 98%   BMI 18.54 kg/m    Subjective:    Patient ID: Monique Tucker, female    DOB: [birth year], 62 y.o.   MRN: [id number]  HPI: Monique Tucker is a 62 y.o. female presenting on 07/18/2022 for comprehensive medical examination. Current medical complaints include:none  She currently lives with: Menopausal Symptoms: no  Patient states she completed treatment for Hep C.    HYPERTENSION without Chronic Kidney Disease Hypertension status: uncontrolled  Satisfied with current treatment? yes Duration of hypertension: years BP monitoring frequency:   checking but didn't remember BP range:  BP medication side effects:  no Medication compliance: excellent compliance Previous BP meds:amlodipine and HCTZ Aspirin: no Recurrent headaches: no Visual changes: no Palpitations: no Dyspnea: yes Chest pain: no Lower extremity edema: no Dizzy/lightheaded: no  COPD COPD status: uncontrolled Satisfied with current treatment?: yes Oxygen use: no Dyspnea frequency: when she exerts herself Cough frequency: yes Rescue inhaler frequency:   Limitation of activity: yes Productive cough:  Last Spirometry:  Pneumovax: Up to Date Influenza: Up to Date  MOOD Patient states her mood has been up and down.  Her daughter is living with her and causing her a lot of stress.  Denies SI.   Depression Screen done today and results listed below:     07/18/2022    9:00 AM 01/13/2022    2:57 PM 10/14/2021    2:54 PM 09/06/2021    3:53 PM 07/15/2021   10:00 AM  Depression screen PHQ 2/9  Decreased Interest 1 0 1 1 0  Down, Depressed, Hopeless '1 2 1 2 '$ 0  PHQ - 2 Score '2 2 2 3 '$ 0  Altered sleeping 1 0 1 0 3  Tired, decreased energy '1 1 2 1 3  '$ Change in appetite 0 0 2 0 0  Feeling bad or failure about yourself  1 1 0 0 0  Trouble  concentrating '3 2 1 1 '$ 0  Moving slowly or fidgety/restless 3 1 0 0 0  Suicidal thoughts 0 0 0 0 0  PHQ-9 Score '11 7 8 5 6  '$ Difficult doing work/chores Very difficult Very difficult Somewhat difficult Somewhat difficult Somewhat difficult    The patient has a history of falls. I did complete a risk assessment for falls. A plan of care for falls was documented.   Past Medical History:  Past Medical History:  Diagnosis Date   Acute hepatitis C    Anxiety    AR (allergic rhinitis)    Arthritis    Bulging lumbar disc    COPD (chronic obstructive pulmonary disease) (HCC)    mild   Depression    Hypertension    Plantar fasciitis    Restless leg syndrome, controlled    Sciatica    left    Surgical History:  Past Surgical History:  Procedure Laterality Date   BLADDER REPAIR     CATARACT EXTRACTION W/PHACO Left 05/13/2020   Procedure: CATARACT EXTRACTION PHACO AND INTRAOCULAR LENS PLACEMENT ([place]) LEFT;  Surgeon: Leandrew Koyanagi, MD;  Location: [facility];  Service: Ophthalmology;  Laterality: Left;  5.15 049.8 10.4%   CATARACT EXTRACTION W/PHACO Right 05/27/2020   Procedure: CATARACT EXTRACTION PHACO AND INTRAOCULAR LENS PLACEMENT ([facility]) RIGHT;  Surgeon:  Leandrew Koyanagi, MD;  Location: [facility];  Service: Ophthalmology;  Laterality: Right;  3.29 0:55.2 6.0%   CYSTOCELE REPAIR N/A 10/19/2015   Procedure: ANTERIOR REPAIR (CYSTOCELE);  Surgeon: Brayton Mars, MD;  Location: [facility];  Service: Gynecology;  Laterality: N/A;   ESOPHAGOGASTRODUODENOSCOPY (EGD) WITH PROPOFOL N/A 05/25/2021   Procedure: ESOPHAGOGASTRODUODENOSCOPY (EGD) WITH PROPOFOL;  Surgeon: Jonathon Bellows, MD;  Location[facility];  Service: Gastroenterology;  Laterality: N/A;   EYE SURGERY     RECTOCELE REPAIR N/A 05/02/2016   Procedure: POSTERIOR REPAIR (RECTOCELE);  Surgeon: Brayton Mars, MD;  Location: [facility];  Service: Gynecology;  Laterality: N/A;   TOTAL VAGINAL HYSTERECTOMY      TUBAL LIGATION     VAGINAL HYSTERECTOMY Bilateral 10/19/2015   Procedure: HYSTERECTOMY VAGINAL WITH BILATERAL SALPINGO-OOPHERECTOMY;  Surgeon: Brayton Mars, MD;  Location: [facility];  Service: Gynecology;  Laterality: Bilateral;    Medications:  Current Outpatient Medications on File Prior to Visit  Medication Sig   ALPRAZolam (XANAX) 0.25 MG tablet Take 0.25 mg by mouth 2 (two) times daily as needed.   amLODipine (NORVASC) 2.5 MG tablet TAKE 1 TABLET BY MOUTH ONCE A DAY. TO TAKE IN CONJUNCTION WITH FIVE MG TABLET   amLODipine (NORVASC) 5 MG tablet TAKE 1 TABLET BY MOUTH ONCE A DAY. TO TAKE WITH 2.5 MG TABLET   diclofenac Sodium (VOLTAREN) 1 % GEL Apply 2 g topically 4 (four) times daily.   EPINEPHrine 0.3 mg/0.3 mL IJ SOAJ injection INJECT 0.3 MLS INTO THE MUSCLE ONCE   estradiol (ESTRACE) 0.1 MG/GM vaginal cream Place 0.5 Applicatorfuls vaginally 2 (two) times a week.   gabapentin (NEURONTIN) 300 MG capsule TAKE 1 CAPSULE BY MOUTH 3 TIMES A DAY   Magnesium 500 MG TABS Take 500 mg by mouth daily.   methocarbamol (ROBAXIN) 500 MG tablet TAKE 1 TABLET BY MOUTH 4 TIMES A DAY   montelukast (SINGULAIR) 10 MG tablet Take 1 tablet (10 mg total) by mouth at bedtime.   potassium chloride SA (KLOR-CON M) 20 MEQ tablet TAKE 1 TABLET BY MOUTH ONCE A DAY   QUEtiapine (SEROQUEL XR) 200 MG 24 hr tablet Take 1 tablet (200 mg total) by mouth at bedtime.   topiramate (TOPAMAX) 50 MG tablet Take 50 mg by mouth daily.   venlafaxine XR (EFFEXOR-XR) 150 MG 24 hr capsule Take 1 capsule (150 mg total) by mouth daily with breakfast.   Current Facility-Administered Medications on File Prior to Visit  Medication   betamethasone acetate-betamethasone sodium phosphate (CELESTONE) injection 3 mg   betamethasone acetate-betamethasone sodium phosphate (CELESTONE) injection 3 mg    Allergies:  Allergies  Allergen Reactions   Bee Venom Anaphylaxis   Hydralazine Other (See Comments)    Dizziness and  Headache   Other     Runny nose and itching eyes SEASONAL ALLEGIES   Lisinopril Anxiety    Social History:  Social History   Socioeconomic History   Marital status: Single    Spouse name: Not on file   Number of children: Not on file   Years of education: Not on file   Highest education level: Not on file  Occupational History   Not on file  Tobacco Use   Smoking status: Every Day    Packs/day: 0.25    Types: Cigarettes   Smokeless tobacco: Never   Tobacco comments:     started age 58  Vaping Use   Vaping Use: Every day  Substance and Sexual Activity   Alcohol use:  Not Currently    Comment:     Drug use: No   Sexual activity: Not Currently  Other Topics Concern   Not on file  Social History Narrative   Not on file   Social Determinants of Health   Financial Resource Strain: Not on file  Food Insecurity: Not on file  Transportation Needs: Not on file  Physical Activity: Inactive (02/15/2018)   Exercise Vital Sign    Days of Exercise per Week: 0 days    Minutes of Exercise per Session: 0 min  Stress: Not on file  Social Connections: Not on file  Intimate Partner Violence: Not on file   Social History   Tobacco Use  Smoking Status Every Day   Packs/day: 0.25   Types: Cigarettes  Smokeless Tobacco Never  Tobacco Comments    started age 3   Social History   Substance and Sexual Activity  Alcohol Use Not Currently   Comment:      Family History:  Family History  Problem Relation Age of Onset   Breast cancer Mother    Pancreatic cancer Mother    Cancer Mother        breast and pancreatic   Hypertension Mother    Migraines Mother    Diabetes Mother    Heart disease Father    Mental illness Sister        depression   Heart attack Sister    Diabetes Sister    Alcohol abuse Maternal Grandfather    Cancer Maternal Grandmother        lung and brain   Mental illness Sister        depression    Past medical history, surgical history,  medications, allergies, family history and social history reviewed with patient today and changes made to appropriate areas of the chart.   Review of Systems  Eyes:  Negative for blurred vision and double vision.  Respiratory:  Positive for cough and shortness of breath.   Cardiovascular:  Negative for chest pain, palpitations and leg swelling.  Neurological:  Positive for headaches. Negative for dizziness.  Psychiatric/Behavioral:  Positive for depression. Negative for suicidal ideas. The patient is nervous/anxious.    All other ROS negative except what is listed above and in the HPI.      Objective:    BP 137/74 (BP Location: Right Arm, Cuff Size: Small)   Pulse 64   Temp 97.8 F (36.6 C) (Oral)   Ht '5\' 4"'$  (1.626 m)   Wt 108 lb (49 kg)   LMP  (LMP Unknown)   SpO2 98%   BMI 18.54 kg/m   Wt Readings from Last 3 Encounters:  07/18/22 108 lb (49 kg)  03/03/22 105 lb 4.8 oz (47.8 kg)  01/13/22 106 lb 12.8 oz (48.4 kg)    Physical Exam Vitals and nursing note reviewed.  Constitutional:      General: She is awake. She is not in acute distress.    Appearance: Normal appearance. She is well-developed. She is not ill-appearing.  HENT:     Head: Normocephalic and atraumatic.     Right Ear: Hearing, tympanic membrane, ear canal and external ear normal. No drainage.     Left Ear: Hearing, tympanic membrane, ear canal and external ear normal. No drainage.     Nose: Nose normal.     Right Sinus: No maxillary sinus tenderness or frontal sinus tenderness.     Left Sinus: No maxillary sinus tenderness or frontal sinus tenderness.  Mouth/Throat:     Mouth: Mucous membranes are moist.     Pharynx: Oropharynx is clear. Uvula midline. No pharyngeal swelling, oropharyngeal exudate or posterior oropharyngeal erythema.  Eyes:     General: Lids are normal.        Right eye: No discharge.        Left eye: No discharge.     Extraocular Movements: Extraocular movements intact.      Conjunctiva/sclera: Conjunctivae normal.     Pupils: Pupils are equal, round, and reactive to light.     Visual Fields: Right eye visual fields normal and left eye visual fields normal.  Neck:     Thyroid: No thyromegaly.     Vascular: No carotid bruit.     Trachea: Trachea normal.  Cardiovascular:     Rate and Rhythm: Normal rate and regular rhythm.     Heart sounds: Normal heart sounds. No murmur heard.    No gallop.  Pulmonary:     Effort: Pulmonary effort is normal. No accessory muscle usage or respiratory distress.     Breath sounds: Normal breath sounds.  Chest:  Breasts:    Right: Normal.     Left: Normal.  Abdominal:     General: Bowel sounds are normal.     Palpations: Abdomen is soft. There is no hepatomegaly or splenomegaly.     Tenderness: There is no abdominal tenderness.  Musculoskeletal:        General: Normal range of motion.     Cervical back: Normal range of motion and neck supple.     Right lower leg: No edema.     Left lower leg: No edema.  Lymphadenopathy:     Head:     Right side of head: No submental, submandibular, tonsillar, preauricular or posterior auricular adenopathy.     Left side of head: No submental, submandibular, tonsillar, preauricular or posterior auricular adenopathy.     Cervical: No cervical adenopathy.     Upper Body:     Right upper body: No supraclavicular, axillary or pectoral adenopathy.     Left upper body: No supraclavicular, axillary or pectoral adenopathy.  Skin:    General: Skin is warm and dry.     Capillary Refill: Capillary refill takes less than 2 seconds.     Findings: No rash.  Neurological:     Mental Status: She is alert and oriented to person, place, and time.     Gait: Gait is intact.  Psychiatric:        Attention and Perception: Attention normal.        Mood and Affect: Mood normal.        Speech: Speech normal.        Behavior: Behavior normal. Behavior is cooperative.        Thought Content: Thought content  normal.        Judgment: Judgment normal.     Results for orders placed or performed in visit on 01/13/22  Comp Met (CMET)  Result Value Ref Range   Glucose 76 70 - 99 mg/dL   BUN 17 8 - 27 mg/dL   Creatinine, Ser 0.95 0.57 - 1.00 mg/dL   eGFR 68 >59 mL/min/1.73   BUN/Creatinine Ratio 18 12 - 28   Sodium 141 134 - 144 mmol/L   Potassium 3.3 (L) 3.5 - 5.2 mmol/L   Chloride 101 96 - 106 mmol/L   CO2 26 20 - 29 mmol/L   Calcium 9.4 8.7 - 10.3 mg/dL  Total Protein 7.1 6.0 - 8.5 g/dL   Albumin 4.5 3.9 - 4.9 g/dL   Globulin, Total 2.6 1.5 - 4.5 g/dL   Albumin/Globulin Ratio 1.7 1.2 - 2.2   Bilirubin Total 0.3 0.0 - 1.2 mg/dL   Alkaline Phosphatase 137 (H) 44 - 121 IU/L   AST 33 0 - 40 IU/L   ALT 22 0 - 32 IU/L  HgB A1c  Result Value Ref Range   Hgb A1c MFr Bld 5.6 4.8 - 5.6 %   Est. average glucose Bld gHb Est-mCnc 114 mg/dL  Lipid Profile  Result Value Ref Range   Cholesterol, Total 178 100 - 199 mg/dL   Triglycerides 259 (H) 0 - 149 mg/dL   HDL 43 >39 mg/dL   VLDL Cholesterol Cal 43 (H) 5 - 40 mg/dL   LDL Chol Calc (NIH) 92 0 - 99 mg/dL   Chol/HDL Ratio 4.1 0.0 - 4.4 ratio  CBC w/Diff  Result Value Ref Range   WBC 4.1 3.4 - 10.8 x10E3/uL   RBC 3.84 3.77 - 5.28 x10E6/uL   Hemoglobin 13.1 11.1 - 15.9 g/dL   Hematocrit 36.9 34.0 - 46.6 %   MCV 96 79 - 97 fL   MCH 34.1 (H) 26.6 - 33.0 pg   MCHC 35.5 31.5 - 35.7 g/dL   RDW 12.4 11.7 - 15.4 %   Platelets 134 (L) 150 - 450 x10E3/uL   Neutrophils 45 Not Estab. %   Lymphs 44 Not Estab. %   Monocytes 7 Not Estab. %   Eos 3 Not Estab. %   Basos 1 Not Estab. %   Neutrophils Absolute 1.8 1.4 - 7.0 x10E3/uL   Lymphocytes Absolute 1.8 0.7 - 3.1 x10E3/uL   Monocytes Absolute 0.3 0.1 - 0.9 x10E3/uL   EOS (ABSOLUTE) 0.1 0.0 - 0.4 x10E3/uL   Basophils Absolute 0.0 0.0 - 0.2 x10E3/uL   Immature Granulocytes 0 Not Estab. %   Immature Grans (Abs) 0.0 0.0 - 0.1 x10E3/uL      Assessment & Plan:   Problem List Items Addressed  This Visit       Cardiovascular and Mediastinum   Hypertension    Chronic. Not well controlled.  Will stop HCTZ and start Losartan '50mg'$ . Side effects and benefits of medication discussed during visit.  Keep checking blood pressures at home.  Bring log to next visit.  Follow up in 1 month.  Call sooner if concerns arise.       Relevant Medications   losartan (COZAAR) 50 MG tablet     Respiratory   Moderate COPD (chronic obstructive pulmonary disease) (HCC)    Chronic.  Having SOB and cough.  Will treat with Medrol dose pak.  Recommend using inhalers and being consistent with them.  Follow up in 1 month.  Call sooner if concerns arise.  Would benefit from updated spirometry in the future.       Relevant Medications   Tiotropium Bromide Monohydrate (SPIRIVA RESPIMAT) 2.5 MCG/ACT AERS   fluticasone-salmeterol (ADVAIR) 250-50 MCG/ACT AEPB   methylPREDNISolone (MEDROL DOSEPAK) 4 MG TBPK tablet     Digestive   Chronic hepatitis C without hepatic coma (HCC)    Chronic. Has completed treatment.  Will have follow up with GI next week.       Relevant Medications   fluconazole (DIFLUCAN) 150 MG tablet   Hepatocellular carcinoma (HCC)    Chronic. Followed by Oncology.  Continue with collaboration.        Relevant Medications   methylPREDNISolone (MEDROL  DOSEPAK) 4 MG TBPK tablet   fluconazole (DIFLUCAN) 150 MG tablet     Other   Bipolar 1 disorder (HCC)    Chronic.  Controlled.  Continue with current medication regimen of Seroquel '200mg'$  daily.  Labs ordered today.  Return to clinic in 6 months for reevaluation.  Call sooner if concerns arise.        Prediabetes    Labs ordered at visit today.  Will make recommendations based on lab results.          Relevant Orders   HgB A1c   Other Visit Diagnoses     Annual physical exam    -  Primary   Health maintenance reviewed during visit today.  Labs ordered.  Vaccines up to date.  Colonoscopy up to date.  PAP up to date.  Mammogram  ordered.   Relevant Orders   CBC with Differential/Platelet   Comprehensive metabolic panel   TSH   Urinalysis, Routine w reflex microscopic   Screening for ischemic heart disease       Relevant Orders   Lipid panel   Encounter for screening mammogram for malignant neoplasm of breast       Relevant Orders   MM 3D SCREEN BREAST BILATERAL   Need for influenza vaccination       Relevant Orders   Flu Vaccine QUAD 37moIM (Fluarix, Fluzone & Alfiuria Quad PF) (Completed)        Follow up plan: Return in about 1 month (around 08/18/2022) for BP Check.   LABORATORY TESTING:  - Pap smear: up to date  IMMUNIZATIONS:   - Tdap: Tetanus vaccination status reviewed: last tetanus booster within 10 years. - Influenza: Up to date - Pneumovax: Up to date - Prevnar: Not applicable - COVID: Not applicable - HPV: Not applicable - Shingrix vaccine: Up to date  SCREENING: -Mammogram: Ordered today  - Colonoscopy: Up to date  - Bone Density: Not applicable  -Hearing Test: Not applicable  -Spirometry: Not applicable   PATIENT COUNSELING:   Advised to take 1 mg of folate supplement per day if capable of pregnancy.   Sexuality: Discussed sexually transmitted diseases, partner selection, use of condoms, avoidance of unintended pregnancy  and contraceptive alternatives.   Advised to avoid cigarette smoking.  I discussed with the patient that most people either abstain from alcohol or drink within safe limits (<=14/week and <=4 drinks/occasion for males, <=7/weeks and <= 3 drinks/occasion for females) and that the risk for alcohol disorders and other health effects rises proportionally with the number of drinks per week and how often a drinker exceeds daily limits.  Discussed cessation/primary prevention of drug use and availability of treatment for abuse.   Diet: Encouraged to adjust caloric intake to maintain  or achieve ideal body weight, to reduce intake of dietary saturated fat and total  fat, to limit sodium intake by avoiding high sodium foods and not adding table salt, and to maintain adequate dietary potassium and calcium preferably from fresh fruits, vegetables, and low-fat dairy products.    stressed the importance of regular exercise  Injury prevention: Discussed safety belts, safety helmets, smoke detector, smoking near bedding or upholstery.   Dental health: Discussed importance of regular tooth brushing, flossing, and dental visits.    NEXT PREVENTATIVE PHYSICAL DUE IN 1 YEAR. Return in about 1 month (around 08/18/2022) for BP Check.

## 2022-07-18 ENCOUNTER — Encounter: Payer: Self-pay | Admitting: Nurse Practitioner

## 2022-07-18 ENCOUNTER — Ambulatory Visit (INDEPENDENT_AMBULATORY_CARE_PROVIDER_SITE_OTHER): Payer: Commercial Managed Care - HMO | Admitting: Nurse Practitioner

## 2022-07-18 VITALS — BP 137/74 | HR 64 | Temp 97.8°F | Ht 64.0 in | Wt 108.0 lb

## 2022-07-18 DIAGNOSIS — R7303 Prediabetes: Secondary | ICD-10-CM

## 2022-07-18 DIAGNOSIS — J449 Chronic obstructive pulmonary disease, unspecified: Secondary | ICD-10-CM

## 2022-07-18 DIAGNOSIS — C220 Liver cell carcinoma: Secondary | ICD-10-CM

## 2022-07-18 DIAGNOSIS — F319 Bipolar disorder, unspecified: Secondary | ICD-10-CM | POA: Diagnosis not present

## 2022-07-18 DIAGNOSIS — Z0000 Encounter for general adult medical examination without abnormal findings: Secondary | ICD-10-CM

## 2022-07-18 DIAGNOSIS — I10 Essential (primary) hypertension: Secondary | ICD-10-CM

## 2022-07-18 DIAGNOSIS — Z23 Encounter for immunization: Secondary | ICD-10-CM

## 2022-07-18 DIAGNOSIS — B182 Chronic viral hepatitis C: Secondary | ICD-10-CM

## 2022-07-18 DIAGNOSIS — Z1231 Encounter for screening mammogram for malignant neoplasm of breast: Secondary | ICD-10-CM

## 2022-07-18 DIAGNOSIS — Z136 Encounter for screening for cardiovascular disorders: Secondary | ICD-10-CM

## 2022-07-18 LAB — URINALYSIS, ROUTINE W REFLEX MICROSCOPIC
Bilirubin, UA: NEGATIVE
Glucose, UA: NEGATIVE
Ketones, UA: NEGATIVE
Leukocytes,UA: NEGATIVE
Nitrite, UA: NEGATIVE
Protein,UA: NEGATIVE
RBC, UA: NEGATIVE
Specific Gravity, UA: <1.005 — ABNORMAL LOW (ref 1.005–1.030)
Urobilinogen, Ur: 0.2 mg/dL (ref 0.2–1.0)
pH, UA: 5 (ref 5.0–7.5)

## 2022-07-18 MED ORDER — SPIRIVA RESPIMAT 2.5 MCG/ACT IN AERS: INHALATION_SPRAY | RESPIRATORY_TRACT | 11 refills | Status: DC

## 2022-07-18 MED ORDER — FLUCONAZOLE 150 MG PO TABS: 150.0000 mg | ORAL_TABLET | Freq: Once | ORAL | 0 refills | Status: AC

## 2022-07-18 MED ORDER — LOSARTAN POTASSIUM 50 MG PO TABS: 50.0000 mg | ORAL_TABLET | Freq: Every day | ORAL | 0 refills | Status: DC

## 2022-07-18 MED ORDER — FLUTICASONE-SALMETEROL 250-50 MCG/ACT IN AEPB: INHALATION_SPRAY | RESPIRATORY_TRACT | 2 refills | Status: DC

## 2022-07-18 MED ORDER — METHYLPREDNISOLONE 4 MG PO TBPK: ORAL_TABLET | ORAL | 0 refills | Status: DC

## 2022-07-18 NOTE — Assessment & Plan Note (Signed)
Labs ordered at visit today.  Will make recommendations based on lab results.   

## 2022-07-18 NOTE — Assessment & Plan Note (Signed)
Chronic.  Having SOB and cough.  Will treat with Medrol dose pak.  Recommend using inhalers and being consistent with them.  Follow up in 1 month.  Call sooner if concerns arise.  Would benefit from updated spirometry in the future.

## 2022-07-18 NOTE — Assessment & Plan Note (Signed)
Chronic. Has completed treatment.  Will have follow up with GI next week.

## 2022-07-18 NOTE — Assessment & Plan Note (Signed)
Chronic.  Controlled.  Continue with current medication regimen of Seroquel '200mg'$  daily.  Labs ordered today.  Return to clinic in 6 months for reevaluation.  Call sooner if concerns arise.

## 2022-07-18 NOTE — Assessment & Plan Note (Signed)
Chronic. Followed by Oncology.  Continue with collaboration.

## 2022-07-18 NOTE — Assessment & Plan Note (Signed)
Chronic. Not well controlled.  Will stop HCTZ and start Losartan '50mg'$ . Side effects and benefits of medication discussed during visit.  Keep checking blood pressures at home.  Bring log to next visit.  Follow up in 1 month.  Call sooner if concerns arise.

## 2022-07-19 LAB — LIPID PANEL
Chol/HDL Ratio: 3 ratio (ref 0.0–4.4)
Cholesterol, Total: 182 mg/dL (ref 100–199)
HDL: 61 mg/dL (ref >39)
LDL Chol Calc (NIH): 102 mg/dL — ABNORMAL HIGH (ref 0–99)
Triglycerides: 109 mg/dL (ref 0–149)
VLDL Cholesterol Cal: 19 mg/dL (ref 5–40)

## 2022-07-19 LAB — CBC WITH DIFFERENTIAL/PLATELET
Basophils Absolute: 0 10*3/uL (ref 0.0–0.2)
Basos: 0 %
EOS (ABSOLUTE): 0.2 10*3/uL (ref 0.0–0.4)
Eos: 4 %
Hematocrit: 43.5 % (ref 34.0–46.6)
Hemoglobin: 14.7 g/dL (ref 11.1–15.9)
Immature Grans (Abs): 0 10*3/uL (ref 0.0–0.1)
Immature Granulocytes: 0 %
Lymphocytes Absolute: 1.7 10*3/uL (ref 0.7–3.1)
Lymphs: 37 %
MCH: 32.9 pg (ref 26.6–33.0)
MCHC: 33.8 g/dL (ref 31.5–35.7)
MCV: 97 fL (ref 79–97)
Monocytes Absolute: 0.4 10*3/uL (ref 0.1–0.9)
Monocytes: 8 %
Neutrophils Absolute: 2.3 10*3/uL (ref 1.4–7.0)
Neutrophils: 51 %
Platelets: 175 10*3/uL (ref 150–450)
RBC: 4.47 x10E6/uL (ref 3.77–5.28)
RDW: 13.1 % (ref 11.7–15.4)
WBC: 4.7 10*3/uL (ref 3.4–10.8)

## 2022-07-19 LAB — COMPREHENSIVE METABOLIC PANEL
ALT: 33 IU/L — ABNORMAL HIGH (ref 0–32)
AST: 33 IU/L (ref 0–40)
Albumin/Globulin Ratio: 1.8 (ref 1.2–2.2)
Albumin: 4.7 g/dL (ref 3.9–4.9)
Alkaline Phosphatase: 159 IU/L — ABNORMAL HIGH (ref 44–121)
BUN/Creatinine Ratio: 26 (ref 12–28)
BUN: 19 mg/dL (ref 8–27)
Bilirubin Total: 0.2 mg/dL (ref 0.0–1.2)
CO2: 24 mmol/L (ref 20–29)
Calcium: 9.7 mg/dL (ref 8.7–10.3)
Chloride: 98 mmol/L (ref 96–106)
Creatinine, Ser: 0.73 mg/dL (ref 0.57–1.00)
Globulin, Total: 2.6 g/dL (ref 1.5–4.5)
Glucose: 121 mg/dL — ABNORMAL HIGH (ref 70–99)
Potassium: 4.4 mmol/L (ref 3.5–5.2)
Sodium: 139 mmol/L (ref 134–144)
Total Protein: 7.3 g/dL (ref 6.0–8.5)
eGFR: 93 mL/min/{1.73_m2} (ref >59)

## 2022-07-19 LAB — TSH: TSH: 4.73 u[IU]/mL — ABNORMAL HIGH (ref 0.450–4.500)

## 2022-07-19 LAB — HEMOGLOBIN A1C
Est. average glucose Bld gHb Est-mCnc: 108 mg/dL
Hgb A1c MFr Bld: 5.4 % (ref 4.8–5.6)

## 2022-07-19 MED ORDER — FLUCONAZOLE 150 MG PO TABS: 150.0000 mg | ORAL_TABLET | Freq: Once | ORAL | 0 refills | Status: AC

## 2022-07-19 NOTE — Progress Notes (Signed)
Hi Monique Tucker.  It was nice to see you yesterday.  Your lab work looks good.  Your thyroid labs are slightly abnormal but no concerning at this time.  We will recheck it at your next visit and if needed we will address it at that time.  [place], no concerns at this time. Continue with your current medication regimen.  Follow up as discussed.  Please let me know if you have any questions.

## 2022-07-19 NOTE — Addendum Note (Signed)
Addended by: Jon Billings on: 07/19/2022 11:10 AM   Modules accepted: Orders

## 2022-07-19 NOTE — Progress Notes (Signed)
I just sent the medication in.  Tell her I'm sorry for the delay.

## 2022-07-24 DIAGNOSIS — B182 Chronic viral hepatitis C: Principal | ICD-10-CM

## 2022-07-29 ENCOUNTER — Ambulatory Visit: Admit: 2022-07-29 | Payer: PRIVATE HEALTH INSURANCE

## 2022-08-08 ENCOUNTER — Telehealth: Payer: Self-pay

## 2022-08-08 NOTE — Telephone Encounter (Signed)
-----   Message from Georgina Peer, [place] sent at 07/18/2022  8:53 AM EST ----- Schedule mammogram- any day or time

## 2022-08-08 NOTE — Telephone Encounter (Signed)
Called and scheduled the patient's mammogram for 08/25/22 at 4:20 pm at [facility].   Tried calling to notify patient of the appointment date and time, no answer and no VM for message to be left. Will try to call again.   OK for [facility] to advise patient of her appointment information above if she calls back.

## 2022-08-09 ENCOUNTER — Other Ambulatory Visit: Payer: Self-pay | Admitting: Nurse Practitioner

## 2022-08-10 NOTE — Telephone Encounter (Signed)
Requested Prescriptions  Pending Prescriptions Disp Refills   amLODipine (NORVASC) 2.5 MG tablet [Pharmacy Med Name: AMLODIPINE BESYLATE 2.5 MG TAB] 90 tablet 0    Sig: TAKE 1 TABLET BY MOUTH ONCE A DAY. TO TAKE IN CONJUNCTION WITH FIVE MG TABLET     Cardiovascular: Calcium Channel Blockers 2 Passed - 08/09/2022 11:56 AM      Passed - Last BP in normal range    BP Readings from Last 1 Encounters:  07/18/22 137/74         Passed - Last Heart Rate in normal range    Pulse Readings from Last 1 Encounters:  07/18/22 64         Passed - Valid encounter within last 6 months    Recent Outpatient Visits           3 weeks ago Annual physical exam   [place], NP   4 months ago COPD exacerbation [facility])   [place], Henrine Screws T, NP   5 months ago Chronic pain of right knee   [place], Karen, NP   6 months ago Moderate COPD (chronic obstructive pulmonary disease) [facility])   [place] Jon Billings, NP   10 months ago Primary hypertension   [place] Jon Billings, NP       Future Appointments             In 1 week Jon Billings, NP [place], [facility]

## 2022-08-18 ENCOUNTER — Ambulatory Visit: Payer: Commercial Managed Care - HMO | Admitting: Nurse Practitioner

## 2022-08-24 ENCOUNTER — Other Ambulatory Visit: Payer: Self-pay

## 2022-08-24 DIAGNOSIS — I10 Essential (primary) hypertension: Secondary | ICD-10-CM

## 2022-09-12 ENCOUNTER — Other Ambulatory Visit: Payer: Self-pay | Admitting: Nurse Practitioner

## 2022-09-13 ENCOUNTER — Other Ambulatory Visit: Payer: Self-pay | Admitting: Nurse Practitioner

## 2022-09-13 NOTE — Telephone Encounter (Signed)
Requested by interface surescripts. Medications discontinued 07/18/22.  Requested Prescriptions  Pending Prescriptions Disp Refills   amLODipine (NORVASC) 5 MG tablet [Pharmacy Med Name: AMLODIPINE BESYLATE 5MG  TABLET] 90 tablet 1    Sig: TAKE ONE TABLET BY MOUTH ONCE A DAY. TO TAKE WITH TWO AND A HALF (2 & 1/2) MG TABLET     Cardiovascular: Calcium Channel Blockers 2 Passed - 09/12/2022  1:43 PM      Passed - Last BP in normal range    BP Readings from Last 1 Encounters:  07/18/22 137/74         Passed - Last Heart Rate in normal range    Pulse Readings from Last 1 Encounters:  07/18/22 64         Passed - Valid encounter within last 6 months    Recent Outpatient Visits           1 month ago Annual physical exam   [place] Jon Billings, NP   5 months ago COPD exacerbation [facility])   [place], Henrine Screws T, NP   6 months ago Chronic pain of right knee   [place], Karen, NP   8 months ago Moderate COPD (chronic obstructive pulmonary disease) ([place])   [place] Jon Billings, NP   11 months ago Primary hypertension   [place] Jon Billings, NP               gabapentin (NEURONTIN) 300 MG capsule [Pharmacy Med Name: GABAPENTIN 300MG  CAPSULE] 270 capsule 1    Sig: TAKE ONE CAPSULE BY [place] A DAY     Neurology: Anticonvulsants - gabapentin Passed - 09/12/2022  1:43 PM      Passed - Cr in normal range and within 360 days    Creatinine, Ser  Date Value Ref Range Status  07/18/2022 0.73 0.57 - 1.00 mg/dL Final         Passed - Completed PHQ-2 or PHQ-9 in the last 360 days      Passed - Valid encounter within last 12 months    Recent Outpatient Visits           1 month ago Annual physical exam   [place] Jon Billings, NP   5 months ago COPD exacerbation [facility]   [place], Henrine Screws T, NP   6 months ago Chronic pain of right knee   [place], NP   8 months ago Moderate COPD (chronic obstructive pulmonary disease) [facility])   [place] Jon Billings, NP   11 months ago Primary hypertension   [place], NP              Refused Prescriptions Disp Refills   venlafaxine XR (EFFEXOR-XR) 75 MG 24 hr capsule [Pharmacy Med Name: VENLAFAXINE HYDROCHLORIDE ER 75MG  ER CAPSULE ER 24HR] 90 capsule 1    Sig: TAKE ONE CAPSULE BY MOUTH ONCE DAILY WITH BREAKFAST. TAKE WITH 150 MG STRENGTH.     Psychiatry: Antidepressants - SNRI - desvenlafaxine & venlafaxine Failed - 09/12/2022  1:43 PM      Failed - Lipid Panel in normal range within the last 12 months    Cholesterol, Total  Date Value Ref Range Status  07/18/2022 182 100 - 199 mg/dL Final   LDL Chol Calc (NIH)  Date Value Ref Range Status  07/18/2022 102 (H) 0 - 99 mg/dL Final   HDL  Date Value Ref Range Status  07/18/2022 61 >39 mg/dL Final   Triglycerides  Date Value Ref Range Status  07/18/2022 109 0 - 149 mg/dL Final         Passed - Cr in normal range and within 360 days    Creatinine, Ser  Date Value Ref Range Status  07/18/2022 0.73 0.57 - 1.00 mg/dL Final         Passed - Completed PHQ-2 or PHQ-9 in the last 360 days      Passed - Last BP in normal range    BP Readings from Last 1 Encounters:  07/18/22 137/74         Passed - Valid encounter within last 6 months    Recent Outpatient Visits           1 month ago Annual physical exam   [place] Jon Billings, NP   5 months ago COPD exacerbation ([place])   [place], Henrine Screws T, NP   6 months ago Chronic pain of right knee   [place] Jon Billings, NP   8 months ago Moderate COPD (chronic  obstructive pulmonary disease) ([place])   [place] Jon Billings, NP   11 months ago Primary hypertension   [place] Jon Billings, NP               hydrochlorothiazide (HYDRODIURIL) 25 MG tablet [Pharmacy Med Name: HYDROCHLOROTHIAZIDE 25MG  TABLET] 90 tablet 1    Sig: TAKE ONE TABLET BY MOUTH ONCE A DAY     Cardiovascular: Diuretics - Thiazide Passed - 09/12/2022  1:43 PM      Passed - Cr in normal range and within 180 days    Creatinine, Ser  Date Value Ref Range Status  07/18/2022 0.73 0.57 - 1.00 mg/dL Final         Passed - K in normal range and within 180 days    Potassium  Date Value Ref Range Status  07/18/2022 4.4 3.5 - 5.2 mmol/L Final         Passed - Na in normal range and within 180 days    Sodium  Date Value Ref Range Status  07/18/2022 139 134 - 144 mmol/L Final         Passed - Last BP in normal range    BP Readings from Last 1 Encounters:  07/18/22 137/74         Passed - Valid encounter within last 6 months    Recent Outpatient Visits           1 month ago Annual physical exam   [place], NP   5 months ago COPD exacerbation [facility])   [place], Henrine Screws T, NP   6 months ago Chronic pain of right knee   [place], Karen, NP   8 months ago Moderate COPD (chronic obstructive pulmonary disease) [facility])   [place] Jon Billings, NP   11 months ago Primary hypertension   [place] Jon Billings, NP

## 2022-09-13 NOTE — Telephone Encounter (Signed)
Requested by interface surescripts.  Requested Prescriptions  Pending Prescriptions Disp Refills   amLODipine (NORVASC) 5 MG tablet [Pharmacy Med Name: AMLODIPINE BESYLATE 5MG  TABLET] 90 tablet 1    Sig: TAKE ONE TABLET BY MOUTH ONCE A DAY. TO TAKE WITH TWO AND A HALF (2 & 1/2) MG TABLET     Cardiovascular: Calcium Channel Blockers 2 Passed - 09/12/2022  1:43 PM      Passed - Last BP in normal range    BP Readings from Last 1 Encounters:  07/18/22 137/74         Passed - Last Heart Rate in normal range    Pulse Readings from Last 1 Encounters:  07/18/22 64         Passed - Valid encounter within last 6 months    Recent Outpatient Visits           1 month ago Annual physical exam   [place] Jon Billings, NP   5 months ago COPD exacerbation [facility])   [place], Henrine Screws T, NP   6 months ago Chronic pain of right knee   [place], Karen, NP   8 months ago Moderate COPD (chronic obstructive pulmonary disease) ([place])   [place] Jon Billings, NP   11 months ago Primary hypertension   [place] Jon Billings, NP               gabapentin (NEURONTIN) 300 MG capsule [Pharmacy Med Name: GABAPENTIN 300MG  CAPSULE] 270 capsule 1    Sig: TAKE ONE CAPSULE BY [place] A DAY     Neurology: Anticonvulsants - gabapentin Passed - 09/12/2022  1:43 PM      Passed - Cr in normal range and within 360 days    Creatinine, Ser  Date Value Ref Range Status  07/18/2022 0.73 0.57 - 1.00 mg/dL Final         Passed - Completed PHQ-2 or PHQ-9 in the last 360 days      Passed - Valid encounter within last 12 months    Recent Outpatient Visits           1 month ago Annual physical exam   [place] Jon Billings, NP   5 months ago COPD exacerbation [facility])   [place], Henrine Screws T, NP   6 months ago Chronic pain of right knee   [place], NP   8 months ago Moderate COPD (chronic obstructive pulmonary disease) [facility])   [place] Jon Billings, NP   11 months ago Primary hypertension   [place], NP              Refused Prescriptions Disp Refills   venlafaxine XR (EFFEXOR-XR) 75 MG 24 hr capsule [Pharmacy Med Name: VENLAFAXINE HYDROCHLORIDE ER 75MG  ER CAPSULE ER 24HR] 90 capsule 1    Sig: TAKE ONE CAPSULE BY MOUTH ONCE DAILY WITH BREAKFAST. TAKE WITH 150 MG STRENGTH.     Psychiatry: Antidepressants - SNRI - desvenlafaxine & venlafaxine Failed - 09/12/2022  1:43 PM      Failed - Lipid Panel in normal range within the last 12 months    Cholesterol, Total  Date Value Ref Range Status  07/18/2022 182 100 - 199 mg/dL Final   LDL Chol Calc (NIH)  Date Value  Ref Range Status  07/18/2022 102 (H) 0 - 99 mg/dL Final   HDL  Date Value Ref Range Status  07/18/2022 61 >39 mg/dL Final   Triglycerides  Date Value Ref Range Status  07/18/2022 109 0 - 149 mg/dL Final         Passed - Cr in normal range and within 360 days    Creatinine, Ser  Date Value Ref Range Status  07/18/2022 0.73 0.57 - 1.00 mg/dL Final         Passed - Completed PHQ-2 or PHQ-9 in the last 360 days      Passed - Last BP in normal range    BP Readings from Last 1 Encounters:  07/18/22 137/74         Passed - Valid encounter within last 6 months    Recent Outpatient Visits           1 month ago Annual physical exam   [place] Jon Billings, NP   5 months ago COPD exacerbation ([place])   [place], Henrine Screws T, NP   6 months ago Chronic pain of right knee   [place] Jon Billings, NP   8 months ago Moderate COPD (chronic obstructive pulmonary disease)  ([place])   [place] Jon Billings, NP   11 months ago Primary hypertension   [place] Jon Billings, NP               hydrochlorothiazide (HYDRODIURIL) 25 MG tablet [Pharmacy Med Name: HYDROCHLOROTHIAZIDE 25MG  TABLET] 90 tablet 1    Sig: TAKE ONE TABLET BY MOUTH ONCE A DAY     Cardiovascular: Diuretics - Thiazide Passed - 09/12/2022  1:43 PM      Passed - Cr in normal range and within 180 days    Creatinine, Ser  Date Value Ref Range Status  07/18/2022 0.73 0.57 - 1.00 mg/dL Final         Passed - K in normal range and within 180 days    Potassium  Date Value Ref Range Status  07/18/2022 4.4 3.5 - 5.2 mmol/L Final         Passed - Na in normal range and within 180 days    Sodium  Date Value Ref Range Status  07/18/2022 139 134 - 144 mmol/L Final         Passed - Last BP in normal range    BP Readings from Last 1 Encounters:  07/18/22 137/74         Passed - Valid encounter within last 6 months    Recent Outpatient Visits           1 month ago Annual physical exam   [place], NP   5 months ago COPD exacerbation [facility])   [place], Henrine Screws T, NP   6 months ago Chronic pain of right knee   [place], NP   8 months ago Moderate COPD (chronic obstructive pulmonary disease) [facility])   [place] Jon Billings, NP   11 months ago Primary hypertension   [place] Jon Billings, NP

## 2022-09-14 NOTE — Telephone Encounter (Signed)
Requested Prescriptions  Pending Prescriptions Disp Refills   potassium chloride SA (KLOR-CON M) 20 MEQ tablet [Pharmacy Med Name: POTASSIUM CHLORIDE ER 20MEQ ER TABLET ER] 90 tablet 0    Sig: TAKE ONE TABLET BY MOUTH ONCE A DAY     Endocrinology:  Minerals - Potassium Supplementation Passed - 09/13/2022  3:24 PM      Passed - K in normal range and within 360 days    Potassium  Date Value Ref Range Status  07/18/2022 4.4 3.5 - 5.2 mmol/L Final         Passed - Cr in normal range and within 360 days    Creatinine, Ser  Date Value Ref Range Status  07/18/2022 0.73 0.57 - 1.00 mg/dL Final         Passed - Valid encounter within last 12 months    Recent Outpatient Visits           1 month ago Annual physical exam   [place], NP   5 months ago COPD exacerbation [facility])   [place], Henrine Screws T, NP   6 months ago Chronic pain of right knee   [place], Karen, NP   8 months ago Moderate COPD (chronic obstructive pulmonary disease) [facility])   [place] Jon Billings, NP   11 months ago Primary hypertension   [place] Jon Billings, NP

## 2022-09-28 ENCOUNTER — Other Ambulatory Visit: Payer: Self-pay | Admitting: Nurse Practitioner

## 2022-09-28 MED ORDER — LOSARTAN POTASSIUM 50 MG PO TABS: 50.0000 mg | ORAL_TABLET | Freq: Every day | ORAL | 0 refills | Status: DC

## 2022-09-28 MED ORDER — EPINEPHRINE 0.3 MG/0.3ML IJ SOAJ: INTRAMUSCULAR | 1 refills | Status: DC

## 2022-09-28 NOTE — Telephone Encounter (Signed)
Requested Prescriptions  Pending Prescriptions Disp Refills   losartan (COZAAR) 50 MG tablet 30 tablet 0    Sig: Take 1 tablet (50 mg total) by mouth daily.     Cardiovascular:  Angiotensin Receptor Blockers Passed - 09/28/2022 10:24 AM      Passed - Cr in normal range and within 180 days    Creatinine, Ser  Date Value Ref Range Status  07/18/2022 0.73 0.57 - 1.00 mg/dL Final         Passed - K in normal range and within 180 days    Potassium  Date Value Ref Range Status  07/18/2022 4.4 3.5 - 5.2 mmol/L Final         Passed - Patient is not pregnant      Passed - Last BP in normal range    BP Readings from Last 1 Encounters:  07/18/22 137/74         Passed - Valid encounter within last 6 months    Recent Outpatient Visits           2 months ago Annual physical exam   [place] Jon Billings, NP   5 months ago COPD exacerbation [facility])   [place], Henrine Screws T, NP   6 months ago Chronic pain of right knee   [place] Jon Billings, NP   8 months ago Moderate COPD (chronic obstructive pulmonary disease) [facility])   [place] Jon Billings, NP   11 months ago Primary hypertension   [place], [place], NP               EPINEPHrine 0.3 mg/0.3 mL IJ SOAJ injection 2 each 1    Sig: INJECT 0.3 MLS INTO THE MUSCLE ONCE     Immunology: Antidotes Passed - 09/28/2022 10:24 AM      Passed - Valid encounter within last 12 months    Recent Outpatient Visits           2 months ago Annual physical exam   [place], NP   5 months ago COPD exacerbation [facility])   [place], Henrine Screws T, NP   6 months ago Chronic pain of right knee   [place], NP   8 months ago Moderate COPD (chronic obstructive pulmonary  disease) [facility])   [place] Jon Billings, NP   11 months ago Primary hypertension   [place] Jon Billings, NP

## 2022-09-28 NOTE — Telephone Encounter (Signed)
Medication Refill - Medication: losartan (COZAAR) 50 MG tablet and EPINEPHrine 0.3 mg/0.3 mL IJ SOAJ injection   Has the patient contacted their pharmacy? Yes.   Pharmacy called to request refill  Preferred Pharmacy (with phone number or street name):  [place], [place] Phone: [phone]  Fax: [phone]     Has the patient been seen for an appointment in the last year OR does the patient have an upcoming appointment? Yes.    Agent: Please be advised that RX refills may take up to 3 business days. We ask that you follow-up with your pharmacy.

## 2022-09-30 ENCOUNTER — Ambulatory Visit: Admit: 2022-09-30 | Discharge: 2022-09-30 | Payer: PRIVATE HEALTH INSURANCE

## 2022-09-30 DIAGNOSIS — C220 Liver cell carcinoma: Principal | ICD-10-CM

## 2022-09-30 DIAGNOSIS — K7682 Hepatic encephalopathy (CMS-HCC): Principal | ICD-10-CM

## 2022-09-30 MED ORDER — RIFAXIMIN 550 MG TABLET
ORAL_TABLET | Freq: Two times a day (BID) | ORAL | 0 refills | 30 days | Status: CP
Start: 2022-09-30 — End: 2022-10-30
  Filled 2022-11-03: qty 60, 30d supply, fill #0

## 2022-10-03 DIAGNOSIS — K7682 Hepatic encephalopathy (CMS-HCC): Principal | ICD-10-CM

## 2022-10-05 NOTE — Progress Notes (Deleted)
LMP  (LMP Unknown)    Subjective:    Patient ID: Monique Tucker, female    DOB: [birth year], 62 y.o.   MRN: [id number]  HPI: Monique Tucker is a 62 y.o. female  No chief complaint on file.  HYPERTENSION without Chronic Kidney Disease Hypertension status: uncontrolled  Satisfied with current treatment? yes Duration of hypertension: years BP monitoring frequency:  checking but didn't remember BP range:  BP medication side effects:  no Medication compliance: excellent compliance Previous BP meds:amlodipine and HCTZ Aspirin: no Recurrent headaches: no Visual changes: no Palpitations: no Dyspnea: yes Chest pain: no Lower extremity edema: no Dizzy/lightheaded: no  COPD COPD status: uncontrolled Satisfied with current treatment?: yes Oxygen use: no Dyspnea frequency: when she exerts herself Cough frequency: yes Rescue inhaler frequency:   Limitation of activity: yes Productive cough:  Last Spirometry:  Pneumovax: Up to Date Influenza: Up to Date  MOOD Patient states her mood has been up and down.  Her daughter is living with her and causing her a lot of stress.  Denies SI.   Flowsheet Row Office Visit from 07/18/2022 in [facility]  PHQ-9 Total Score 11         07/18/2022    9:00 AM 01/13/2022    2:58 PM 10/14/2021    2:54 PM 09/06/2021    3:52 PM  GAD 7 : Generalized Anxiety Score  Nervous, Anxious, on Edge 3 3 2 1   Control/stop worrying 3 3 2 3   Worry too much - different things 3 3 2 3   Trouble relaxing 3 3 2 1   Restless 3 2 2  0  Easily annoyed or irritable 2 3 3 2   Afraid - awful might happen 1 3 3 2   Total GAD 7 Score 18 20 16 12   Anxiety Difficulty Somewhat difficult Very difficult Somewhat difficult Somewhat difficult      Relevant past medical, surgical, family and social history reviewed and updated as indicated. Interim medical history since our last visit reviewed. Allergies and medications reviewed and  updated.  Review of Systems  Per HPI unless specifically indicated above     Objective:    LMP  (LMP Unknown)   Wt Readings from Last 3 Encounters:  07/18/22 108 lb (49 kg)  03/03/22 105 lb 4.8 oz (47.8 kg)  01/13/22 106 lb 12.8 oz (48.4 kg)    Physical Exam  Results for orders placed or performed in visit on 07/18/22  CBC with Differential/Platelet  Result Value Ref Range   WBC 4.7 3.4 - 10.8 x10E3/uL   RBC 4.47 3.77 - 5.28 x10E6/uL   Hemoglobin 14.7 11.1 - 15.9 g/dL   Hematocrit 48.5 46.2 - 46.6 %   MCV 97 79 - 97 fL   MCH 32.9 26.6 - 33.0 pg   MCHC 33.8 31.5 - 35.7 g/dL   RDW 70.3 50.0 - 93.8 %   Platelets 175 150 - 450 x10E3/uL   Neutrophils 51 Not Estab. %   Lymphs 37 Not Estab. %   Monocytes 8 Not Estab. %   Eos 4 Not Estab. %   Basos 0 Not Estab. %   Neutrophils Absolute 2.3 1.4 - 7.0 x10E3/uL   Lymphocytes Absolute 1.7 0.7 - 3.1 x10E3/uL   Monocytes Absolute 0.4 0.1 - 0.9 x10E3/uL   EOS (ABSOLUTE) 0.2 0.0 - 0.4 x10E3/uL   Basophils Absolute 0.0 0.0 - 0.2 x10E3/uL   Immature Granulocytes 0 Not Estab. %   Immature Grans (Abs) 0.0 0.0 -  0.1 x10E3/uL  Comprehensive metabolic panel  Result Value Ref Range   Glucose 121 (H) 70 - 99 mg/dL   BUN 19 8 - 27 mg/dL   Creatinine, Ser 0.37 0.57 - 1.00 mg/dL   eGFR 93 >09 UK/RCV/8.18   BUN/Creatinine Ratio 26 12 - 28   Sodium 139 134 - 144 mmol/L   Potassium 4.4 3.5 - 5.2 mmol/L   Chloride 98 96 - 106 mmol/L   CO2 24 20 - 29 mmol/L   Calcium 9.7 8.7 - 10.3 mg/dL   Total Protein 7.3 6.0 - 8.5 g/dL   Albumin 4.7 3.9 - 4.9 g/dL   Globulin, Total 2.6 1.5 - 4.5 g/dL   Albumin/Globulin Ratio 1.8 1.2 - 2.2   Bilirubin Total 0.2 0.0 - 1.2 mg/dL   Alkaline Phosphatase 159 (H) 44 - 121 IU/L   AST 33 0 - 40 IU/L   ALT 33 (H) 0 - 32 IU/L  Lipid panel  Result Value Ref Range   Cholesterol, Total 182 100 - 199 mg/dL   Triglycerides 403 0 - 149 mg/dL   HDL 61 >75 mg/dL   VLDL Cholesterol Cal 19 5 - 40 mg/dL   LDL Chol  Calc (NIH) 102 (H) 0 - 99 mg/dL   Chol/HDL Ratio 3.0 0.0 - 4.4 ratio  TSH  Result Value Ref Range   TSH 4.730 (H) 0.450 - 4.500 uIU/mL  Urinalysis, Routine w reflex microscopic  Result Value Ref Range   Specific Gravity, UA <1.005 (L) 1.005 - 1.030   pH, UA 5.0 5.0 - 7.5   Color, UA Yellow Yellow   Appearance Ur Clear Clear   Leukocytes,UA Negative Negative   Protein,UA Negative Negative/Trace   Glucose, UA Negative Negative   Ketones, UA Negative Negative   RBC, UA Negative Negative   Bilirubin, UA Negative Negative   Urobilinogen, Ur 0.2 0.2 - 1.0 mg/dL   Nitrite, UA Negative Negative   Microscopic Examination Comment   Hemoglobin A1c  Result Value Ref Range   Hgb A1c MFr Bld 5.4 4.8 - 5.6 %   Est. average glucose Bld gHb Est-mCnc 108 mg/dL      Assessment & Plan:   Problem List Items Addressed This Visit       Cardiovascular and Mediastinum   Hypertension - Primary     Respiratory   Moderate COPD (chronic obstructive pulmonary disease)     Other   Prediabetes   Hypercholesterolemia     Follow up plan: No follow-ups on file.

## 2022-10-06 ENCOUNTER — Ambulatory Visit: Payer: Commercial Managed Care - HMO | Admitting: Nurse Practitioner

## 2022-10-07 NOTE — Unmapped (Signed)
[facility] Specialty Medication Onboarding    Specialty Medication: XIFAXAN 550 mg Tab (rifAXIMin)  Prior Authorization: Approved   Financial Assistance: No - copay  <$25  Final Copay/Day Supply: $0 / 30    Insurance Restrictions: None     Notes to Pharmacist:     The triage team has completed the benefits investigation and has determined that the patient is able to fill this medication at [facility]. Please contact the patient to complete the onboarding or follow up with the prescribing physician as needed.

## 2022-10-10 DIAGNOSIS — B182 Chronic viral hepatitis C: Principal | ICD-10-CM

## 2022-10-10 NOTE — Unmapped (Signed)
[facility]   Specialty Lite Counseling    Ms.Robin Velez is a 62 y.o. female with Hepatic Encephalopathy who I am counseling today on initiation of therapy.  I am speaking to the patient.    Was a Nurse, learning disability used for this call? No    Verified patient's date of birth / HIPAA.    Specialty Lite medication(s) to be sent: Infectious Disease: Xifaxan      Non-specialty medications/supplies to be sent: n/a      Medications not needed at this time: n/a         An offer to provide counseling to the patient regarding their medication was made. The patient accepted counseling. The patient was counseled on the following only: medication administration, missed dose instructions, goals of therapy, side effects and monitoring parameters, warnings and precautions, drug/food interactions, and storage, handling precautions, and disposal    Xifaxan (rifaximin) 550mg  tablets      Current Medications (including OTC/herbals), Comorbidities and Allergies     Current Outpatient Medications   Medication Sig Dispense Refill    EPINEPHrine (EPIPEN) 0.3 mg/0.3 mL injection INJECT 0.3 MLS INTO THE MUSCLE ONCE      fluticasone propion-salmeterol (ADVAIR, WIXELA) 250-50 mcg/dose diskus INHALE 1 PUFF INTO THE LUNGS 2 TIMES DAILY      gabapentin (NEURONTIN) 300 MG capsule Take 1 capsule (300 mg total) by mouth Three (3) times a day. Currently taking occasionally when pain comes back. When she does take the medication it is up to BID but ONLY as needed.      methocarbamoL (ROBAXIN) 500 MG tablet Take 1 tablet (500 mg total) by mouth four (4) times a day. Only taking as needed .  Maybe BID as needed.      OXcarbazepine (TRILEPTAL) 150 MG tablet Take 1 tablet (150 mg total) by mouth daily.      amLODIPine (NORVASC) 2.5 MG tablet Take 1 tablet (2.5 mg total) by mouth daily.      amLODIPine (NORVASC) 5 MG tablet Take 1 tablet (5 mg total) by mouth daily.      busPIRone (BUSPAR) 10 MG tablet Take 1 tablet (10 mg total) by mouth Three (3) times a day.      clonazePAM (KLONOPIN) 0.5 MG tablet Take 1 tablet (0.5 mg total) by mouth two (2) times a day as needed for anxiety.      estradioL (ESTRACE) 0.01 % (0.1 mg/gram) vaginal cream Insert 0.5 Applicatorfuls into the vagina.      losartan (COZAAR) 50 MG tablet Take 1 tablet (50 mg total) by mouth daily.      magnesium gluconate (MAGONATE) 27.5 mg magne- sium (500 mg) tablet Take 1 tablet (500 mg total) by mouth nightly.      montelukast (SINGULAIR) 10 mg tablet Take 1 tablet (10 mg total) by mouth nightly.      potassium chloride 20 MEQ ER tablet Take 1 tablet (20 mEq total) by mouth daily.      QUEtiapine (SEROQUEL XR) 150 mg Tb24 extended released tablet Take 1 tablet (150 mg total) by mouth nightly.      rifAXIMin (XIFAXAN) 550 mg Tab Take 1 tablet (550 mg total) by mouth two (2) times a day. 60 tablet 0    venlafaxine (EFFEXOR-XR) 150 MG 24 hr capsule 1 capsule (150 mg total) daily.       No current facility-administered medications for this visit.       Allergies   Allergen Reactions    Venom-Honey Bee  Anaphylaxis    Other Other (See Comments)     Runny nose and itching eyes  SEASONAL ALLEGIES  Runny nose and itching eyes  SEASONAL ALLEGIES      Lisinopril Anxiety       Patient Active Problem List   Diagnosis    Hepatocellular carcinoma (CMS-HCC)       Reviewed and up to date in Epic.    Appropriateness of Therapy     Prescription has been clinically reviewed: Yes    Financial Information     Medication Assistance provided: Prior Authorization    Anticipated copay of $0.00 reviewed with patient.     Patient Specific Needs     Does the patient have any physical, cognitive, or cultural barriers? No    Does the patient have adequate living arrangements? (i.e. the ability to store and take their medication appropriately) Yes    Did you identify any home environmental safety or security hazards? No    Patient prefers to have medications discussed with  Patient     Is the patient or caregiver able to read and understand education materials at a high school level or above? Yes    Patient's primary language is  English     Is the patient high risk? No    SOCIAL DETERMINANTS OF HEALTH     At the [facility], we have learned that life circumstances - like trouble affording food, housing, utilities, or transportation can affect the health of many of our patients.   That is why we wanted to ask: are you currently experiencing any life circumstances that are negatively impacting your health and/or quality of life? Patient declined to answer    Social Determinants of Health     Financial Resource Strain: Not on file   Internet Connectivity: Not on file   Food Insecurity: Not on file   Tobacco Use: High Risk (07/18/2022)    Received from [facility]    Patient History     Smoking Tobacco Use: Every Day     Smokeless Tobacco Use: Never     Passive Exposure: Not on file   Housing/Utilities: Not on file   Alcohol Use: Unknown (05/31/2018)    Received from [facility]    AUDIT-C     Frequency of Alcohol Consumption: 4 or more times a week     Average Number of Drinks: 1 or 2     Frequency of Binge Drinking: Not on file   Transportation Needs: Not on file   Substance Use: Not on file   Health Literacy: Not on file   Physical Activity: Inactive (02/15/2018)    Received from [facility]    Exercise Vital Sign     Days of Exercise per Week: 0 days     Minutes of Exercise per Session: 0 min   Interpersonal Safety: Not on file   Stress: Not on file   Intimate Partner Violence: Not on file   Depression: Not on file   Social Connections: Not on file       Would you be willing to receive help with any of the needs that you have identified today? Not applicable    Delivery Information     Verified delivery address.    Scheduled delivery date: 10/12/22    Expected start date: 10/12/22    Medication will be delivered via UPS to the prescription address in [facility].  This shipment will not require a signature. Explained the  services we provide at [facility] and that each month we will send text messages and/or mychart messages to set up refills. Informed patient that refills should be scheduled 7-10 days prior to when they will run out of medication. Informed patient that a welcome packet, containing information about our pharmacy and other support services, a Notice of Privacy Practices, and a drug information handout will be sent.      The patient or caregiver noted above participated in the development of this care plan and knows that they can request review of or adjustments to the care plan at any time.      Patient or caregiver verbalized understanding of the above information as well as how to contact the pharmacy at [phone] option 4 with any questions/concerns.  The pharmacy is open Monday through Friday 8:30am-4:30pm.  A pharmacist is available 24/7 via pager to answer any clinical questions they may have.      Roderic Palau, PharmD  [facility] Specialty Pharmacist

## 2022-10-10 NOTE — Unmapped (Signed)
Hepatology Clinic Pharmacist Note     Primary Hepatology provider: Dr. 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 Pt reports her appt is on 10/24/22 but unsure if she will be able to complete labs as she was told she would need to get labs done next day and pt does not have transportation. Advised I would put in the lab order for [facility] and pt can complete at her convenience. She verbalized understanding.    Robin Velez, Pharm D., BCPS, BCGP, CPP  [facility]  [address]  [place], [place] [address]  [phone]

## 2022-10-11 NOTE — Unmapped (Signed)
Robin Velez 's xifaxan shipment will be delayed as a result of insurance rejects as non-primary.     I have reached out to the patient  at [phone] and left a voicemail message.  We will wait for a call back from the patient to reschedule the delivery.  We have not confirmed the new delivery date.

## 2022-10-19 ENCOUNTER — Ambulatory Visit: Payer: Commercial Managed Care - HMO | Admitting: Nurse Practitioner

## 2022-10-19 NOTE — Progress Notes (Deleted)
LMP  (LMP Unknown)    Subjective:    Patient ID: Monique Tucker, female    DOB: March 06, 1961, 62 y.o.   MRN: [id number]  HPI: Monique Tucker is a 62 y.o. female  No chief complaint on file.   Relevant past medical, surgical, family and social history reviewed and updated as indicated. Interim medical history since our last visit reviewed. Allergies and medications reviewed and updated.  Review of Systems  Per HPI unless specifically indicated above     Objective:    LMP  (LMP Unknown)   Wt Readings from Last 3 Encounters:  07/18/22 108 lb (49 kg)  03/03/22 105 lb 4.8 oz (47.8 kg)  01/13/22 106 lb 12.8 oz (48.4 kg)    Physical Exam  Results for orders placed or performed in visit on 07/18/22  CBC with Differential/Platelet  Result Value Ref Range   WBC 4.7 3.4 - 10.8 x10E3/uL   RBC 4.47 3.77 - 5.28 x10E6/uL   Hemoglobin 14.7 11.1 - 15.9 g/dL   Hematocrit 96.2 95.2 - 46.6 %   MCV 97 79 - 97 fL   MCH 32.9 26.6 - 33.0 pg   MCHC 33.8 31.5 - 35.7 g/dL   RDW 84.1 32.4 - 40.1 %   Platelets 175 150 - 450 x10E3/uL   Neutrophils 51 Not Estab. %   Lymphs 37 Not Estab. %   Monocytes 8 Not Estab. %   Eos 4 Not Estab. %   Basos 0 Not Estab. %   Neutrophils Absolute 2.3 1.4 - 7.0 x10E3/uL   Lymphocytes Absolute 1.7 0.7 - 3.1 x10E3/uL   Monocytes Absolute 0.4 0.1 - 0.9 x10E3/uL   EOS (ABSOLUTE) 0.2 0.0 - 0.4 x10E3/uL   Basophils Absolute 0.0 0.0 - 0.2 x10E3/uL   Immature Granulocytes 0 Not Estab. %   Immature Grans (Abs) 0.0 0.0 - 0.1 x10E3/uL  Comprehensive metabolic panel  Result Value Ref Range   Glucose 121 (H) 70 - 99 mg/dL   BUN 19 8 - 27 mg/dL   Creatinine, Ser 0.27 0.57 - 1.00 mg/dL   eGFR 93 >25 DG/UYQ/0.34   BUN/Creatinine Ratio 26 12 - 28   Sodium 139 134 - 144 mmol/L   Potassium 4.4 3.5 - 5.2 mmol/L   Chloride 98 96 - 106 mmol/L   CO2 24 20 - 29 mmol/L   Calcium 9.7 8.7 - 10.3 mg/dL   Total Protein 7.3 6.0 - 8.5 g/dL   Albumin 4.7 3.9 - 4.9 g/dL    Globulin, Total 2.6 1.5 - 4.5 g/dL   Albumin/Globulin Ratio 1.8 1.2 - 2.2   Bilirubin Total 0.2 0.0 - 1.2 mg/dL   Alkaline Phosphatase 159 (H) 44 - 121 IU/L   AST 33 0 - 40 IU/L   ALT 33 (H) 0 - 32 IU/L  Lipid panel  Result Value Ref Range   Cholesterol, Total 182 100 - 199 mg/dL   Triglycerides 742 0 - 149 mg/dL   HDL 61 >59 mg/dL   VLDL Cholesterol Cal 19 5 - 40 mg/dL   LDL Chol Calc (NIH) 563 (H) 0 - 99 mg/dL   Chol/HDL Ratio 3.0 0.0 - 4.4 ratio  TSH  Result Value Ref Range   TSH 4.730 (H) 0.450 - 4.500 uIU/mL  Urinalysis, Routine w reflex microscopic  Result Value Ref Range   Specific Gravity, UA <1.005 (L) 1.005 - 1.030   pH, UA 5.0 5.0 - 7.5   Color, UA Yellow Yellow   Appearance Ur Clear  Clear   Leukocytes,UA Negative Negative   Protein,UA Negative Negative/Trace   Glucose, UA Negative Negative   Ketones, UA Negative Negative   RBC, UA Negative Negative   Bilirubin, UA Negative Negative   Urobilinogen, Ur 0.2 0.2 - 1.0 mg/dL   Nitrite, UA Negative Negative   Microscopic Examination Comment   Hemoglobin A1c  Result Value Ref Range   Hgb A1c MFr Bld 5.4 4.8 - 5.6 %   Est. average glucose Bld gHb Est-mCnc 108 mg/dL      Assessment & Plan:   Problem List Items Addressed This Visit   None    Follow up plan: No follow-ups on file.

## 2022-10-20 ENCOUNTER — Other Ambulatory Visit: Payer: Self-pay | Admitting: Nurse Practitioner

## 2022-10-20 NOTE — Telephone Encounter (Signed)
Requested Prescriptions  Pending Prescriptions Disp Refills   losartan (COZAAR) 50 MG tablet [Pharmacy Med Name: LOSARTAN POTASSIUM  TABLET] 90 tablet 1    Sig: TAKE ONE TABLET (50 MG TOTAL) BY MOUTH DAILY.     Cardiovascular:  Angiotensin Receptor Blockers Passed - 10/20/2022  1:35 PM      Passed - Cr in normal range and within 180 days    Creatinine, Ser  Date Value Ref Range Status  07/18/2022 0.73 0.57 - 1.00 mg/dL Final         Passed - K in normal range and within 180 days    Potassium  Date Value Ref Range Status  07/18/2022 4.4 3.5 - 5.2 mmol/L Final         Passed - Patient is not pregnant      Passed - Last BP in normal range    BP Readings from Last 1 Encounters:  07/18/22 137/74         Passed - Valid encounter within last 6 months    Recent Outpatient Visits           3 months ago Annual physical exam   [facility] Larae Grooms, NP   6 months ago COPD exacerbation [facility])   [place] [facility] [place], Corrie Dandy T, NP   7 months ago Chronic pain of right knee   [place] [facility] Larae Grooms, NP   9 months ago Moderate COPD (chronic obstructive pulmonary disease) (HCC)   [place] [facility] Larae Grooms, NP   1 year ago Primary hypertension   [place] [facility] Larae Grooms, NP

## 2022-10-20 NOTE — Telephone Encounter (Signed)
Requested Prescriptions  Pending Prescriptions Disp Refills   montelukast (SINGULAIR) 10 MG tablet [Pharmacy Med Name: MONTELUKAST SODIUM  TABLET] 90 tablet 2    Sig: TAKE ONE TABLET BY MOUTH AT BEDTIME     Pulmonology:  Leukotriene Inhibitors Passed - 10/20/2022  1:35 PM      Passed - Valid encounter within last 12 months    Recent Outpatient Visits           3 months ago Annual physical exam   [place] [facility] Larae Grooms, NP   6 months ago COPD exacerbation [facility])   [place] [facility] [place], Corrie Dandy T, NP   7 months ago Chronic pain of right knee   [place] [facility] Larae Grooms, NP   9 months ago Moderate COPD (chronic obstructive pulmonary disease) (HCC)   [place] [facility] Larae Grooms, NP   1 year ago Primary hypertension   [place] [facility] Larae Grooms, NP

## 2022-10-31 DIAGNOSIS — K7682 Hepatic encephalopathy (CMS-HCC): Principal | ICD-10-CM

## 2022-10-31 NOTE — Unmapped (Signed)
error 

## 2022-10-31 NOTE — Unmapped (Signed)
Pt left a message on nurseline.  Reports prescribed medication ( does not remember the name) is not covered by her insurance.  It is the medicine   ' that helps in flushing toxin in her liver.'  Was advised by her insurance to call the clinic.

## 2022-10-31 NOTE — Unmapped (Signed)
RN Navigator contacted pharmacy. Was told they are in the process of working on authorization.     Attempted to contact pt. Reached VM. Left message stating team is working on PA and I will contact her when I know more. Left call back number and encouraged her to call back with any questions.

## 2022-11-01 NOTE — Unmapped (Signed)
Robin Velez 's Xifaxan shipment will be sent out  as a result of insurance issue resolved, PA approved.     I have reached out to the patient  at [phone] and communicated the delivery change. We will reschedule the medication for the delivery date that the patient agreed upon.  We have confirmed the delivery date as 5/10, via ups.

## 2022-11-28 ENCOUNTER — Other Ambulatory Visit: Payer: Self-pay | Admitting: Nurse Practitioner

## 2022-11-28 ENCOUNTER — Telehealth: Payer: Medicaid Other | Admitting: Nurse Practitioner

## 2022-11-28 NOTE — Telephone Encounter (Signed)
Medication Refill - Medication:  amLODipine (NORVASC) 2.5 MG tablet methocarbamol (ROBAXIN) 500 MG tablet    Has the patient contacted their pharmacy? No. (Agent: If no, request that the patient contact the pharmacy for the refill. If patient does not wish to contact the pharmacy document the reason why and proceed with request.) (Agent: If yes, when and what did the pharmacy advise?)  Preferred Pharmacy (with phone number or street name):  [facility] - [place], [place] - [address] Phone: [phone]  Fax: [phone]     Has the patient been seen for an appointment in the last year OR does the patient have an upcoming appointment? Yes.    Agent: Please be advised that RX refills may take up to 3 business days. We ask that you follow-up with your pharmacy.

## 2022-11-29 DIAGNOSIS — K7682 Hepatic encephalopathy (CMS-HCC): Principal | ICD-10-CM

## 2022-11-29 MED ORDER — XIFAXAN 550 MG TABLET
ORAL_TABLET | Freq: Two times a day (BID) | ORAL | 0 refills | 30 days
Start: 2022-11-29 — End: 2022-12-29

## 2022-11-29 MED ORDER — AMLODIPINE BESYLATE 2.5 MG PO TABS: ORAL_TABLET | ORAL | 0 refills | Status: DC

## 2022-11-29 MED ORDER — METHOCARBAMOL 500 MG PO TABS: 500.0000 mg | ORAL_TABLET | Freq: Four times a day (QID) | ORAL | 0 refills | Status: DC

## 2022-11-29 NOTE — Telephone Encounter (Signed)
Requested Prescriptions  Pending Prescriptions Disp Refills   amLODipine (NORVASC) 2.5 MG tablet 90 tablet 0    Sig: TAKE 1 TABLET BY MOUTH ONCE A DAY. TO TAKE IN CONJUNCTION WITH FIVE MG TABLET     Cardiovascular: Calcium Channel Blockers 2 Passed - 11/28/2022  3:38 PM      Passed - Last BP in normal range    BP Readings from Last 1 Encounters:  07/18/22 137/74         Passed - Last Heart Rate in normal range    Pulse Readings from Last 1 Encounters:  07/18/22 64         Passed - Valid encounter within last 6 months    Recent Outpatient Visits           4 months ago Annual physical exam   [facility]ae [facility]rooms, NP   7 months ago COPD exacerbation [facility])   [place] [facility] [place], Corrie Dandy T, NP   9 months ago Chronic pain of right knee   [place] [facility] Larae Grooms, NP   10 months ago Moderate COPD (chronic obstructive pulmonary disease) (HCC)   [place] [facility] Larae Grooms, NP   1 year ago Primary hypertension   [place] [facility] Larae Grooms, NP       Future Appointments             In 1 week Larae Grooms, NP [facility]             methocarbamol (ROBAXIN) 500 MG tablet 90 tablet 1    Sig: Take 1 tablet (500 mg total) by mouth 4 (four) times daily.     Not Delegated - Analgesics:  Muscle Relaxants Failed - 11/28/2022  3:38 PM      Failed - This refill cannot be delegated      Passed - Valid encounter within last 6 months    Recent Outpatient Visits           4 months ago Annual physical exam   [place] [facility] Larae Grooms, NP   7 months ago COPD exacerbation [facility])   [place] [facility] [place], Corrie Dandy T, NP   9 months ago Chronic pain of right knee   [place] [facility] Larae Grooms, NP   10 months ago Moderate COPD (chronic  obstructive pulmonary disease) (HCC)   [place] [facility] Larae Grooms, NP   1 year ago Primary hypertension    [facility] Larae Grooms, NP       Future Appointments             In 1 week Larae Grooms, NP  [facility]

## 2022-11-29 NOTE — Telephone Encounter (Signed)
Requested medication (s) are due for refill today: Yes  Requested medication (s) are on the active medication list: Yes  Last refill:  07/12/22  Future visit scheduled: Yes  Notes to clinic:  See request.    Requested Prescriptions  Pending Prescriptions Disp Refills   methocarbamol (ROBAXIN) 500 MG tablet 90 tablet 1    Sig: Take 1 tablet (500 mg total) by mouth 4 (four) times daily.     Not Delegated - Analgesics:  Muscle Relaxants Failed - 11/28/2022  3:38 PM      Failed - This refill cannot be delegated      Passed - Valid encounter within last 6 months    Recent Outpatient Visits           4 months ago Annual physical exam   [place] [facility] Larae Grooms, NP   7 months ago COPD exacerbation [facility])   [place] [facility] [place], Corrie Dandy T, NP   9 months ago Chronic pain of right knee   [place] Larae Grooms, NP   10 months ago Moderate COPD (chronic obstructive pulmonary disease) (HCC)   [place] [facility] Larae Grooms, NP   1 year ago Primary hypertension   [place] [facility] Larae Grooms, NP       Future Appointments             In 1 week Larae Grooms, NP [place] [facility]            Signed Prescriptions Disp Refills   amLODipine (NORVASC) 2.5 MG tablet 90 tablet 0    Sig: TAKE 1 TABLET BY MOUTH ONCE A DAY. TO TAKE IN CONJUNCTION WITH FIVE MG TABLET     Cardiovascular: Calcium Channel Blockers 2 Passed - 11/28/2022  3:38 PM      Passed - Last BP in normal range    BP Readings from Last 1 Encounters:  07/18/22 137/74         Passed - Last Heart Rate in normal range    Pulse Readings from Last 1 Encounters:  07/18/22 64         Passed - Valid encounter within last 6 months    Recent Outpatient Visits           4 months ago Annual physical exam   [place] [facility] Lar[facility] Grooms, NP    7 months ago COPD exacerbation [facility])   [place] [facility] [place], Corrie Dandy T, NP   9 months ago Chronic pain of right knee   [place] [facility] Larae Grooms, NP   10 months ago Moderate COPD (chronic obstructive pulmonary disease) (HCC)   [place] [facility] Larae Grooms, NP   1 year ago Primary hypertension    [facility] Larae Grooms, NP       Future Appointments             In 1 week Larae Grooms, NP  [facility]

## 2022-11-30 NOTE — Unmapped (Signed)
[facility] Refill Coordination Note    Specialty Lite Medication(s) to be Shipped:   Infectious Disease: Xifaxan    Other medication(s) to be shipped: No additional medications requested for fill at this time     Robin Velez, DOB: [birth year]  Phone: There are no phone numbers on file.      All above HIPAA information was verified with patient.     Was a Nurse, learning disability used for this call? No    Changes to medications: [name] reports no changes at this time.  Changes to insurance: No      REFERRAL TO PHARMACIST     Referral to the pharmacist: Not needed      [facility]     Shipping address confirmed in Epic.     Delivery Scheduled: Yes, Expected medication delivery date: 12/06/2022.  However, Rx request for refills was sent to the provider as there are none remaining.     Medication will be delivered via UPS to the prescription address in Epic [facility].    Robin Velez   [facility] Specialty Technician

## 2022-11-30 NOTE — Unmapped (Signed)
Pt is requesting refill    Most recent clinic visit: 09/30/2022  Next clinic visit:  01/06/2023

## 2022-12-01 MED ORDER — RIFAXIMIN 550 MG TABLET
ORAL_TABLET | Freq: Two times a day (BID) | ORAL | 0 refills | 30 days | Status: CP
Start: 2022-12-01 — End: 2022-12-31
  Filled 2022-12-05: qty 60, 30d supply, fill #0

## 2022-12-06 ENCOUNTER — Ambulatory Visit (INDEPENDENT_AMBULATORY_CARE_PROVIDER_SITE_OTHER): Payer: Medicaid Other | Admitting: Nurse Practitioner

## 2022-12-06 ENCOUNTER — Encounter: Payer: Self-pay | Admitting: Nurse Practitioner

## 2022-12-06 VITALS — BP 153/77 | HR 87 | Temp 97.9°F | Wt 114.6 lb

## 2022-12-06 DIAGNOSIS — E7800 Pure hypercholesterolemia, unspecified: Secondary | ICD-10-CM | POA: Diagnosis not present

## 2022-12-06 DIAGNOSIS — R7303 Prediabetes: Secondary | ICD-10-CM | POA: Diagnosis not present

## 2022-12-06 DIAGNOSIS — I10 Essential (primary) hypertension: Secondary | ICD-10-CM | POA: Diagnosis not present

## 2022-12-06 DIAGNOSIS — J449 Chronic obstructive pulmonary disease, unspecified: Secondary | ICD-10-CM

## 2022-12-06 DIAGNOSIS — C220 Liver cell carcinoma: Secondary | ICD-10-CM | POA: Diagnosis not present

## 2022-12-06 DIAGNOSIS — B1710 Acute hepatitis C without hepatic coma: Secondary | ICD-10-CM

## 2022-12-06 MED ORDER — FLUTICASONE-SALMETEROL 250-50 MCG/ACT IN AEPB: INHALATION_SPRAY | RESPIRATORY_TRACT | 2 refills | Status: DC

## 2022-12-06 MED ORDER — GABAPENTIN 300 MG PO CAPS: 300.0000 mg | ORAL_CAPSULE | Freq: Two times a day (BID) | ORAL | 1 refills | Status: DC

## 2022-12-06 MED ORDER — SPIRIVA RESPIMAT 2.5 MCG/ACT IN AERS: INHALATION_SPRAY | RESPIRATORY_TRACT | 11 refills | Status: DC

## 2022-12-06 MED ORDER — ALBUTEROL SULFATE HFA 108 (90 BASE) MCG/ACT IN AERS: 2.0000 | INHALATION_SPRAY | Freq: Four times a day (QID) | RESPIRATORY_TRACT | 3 refills | Status: DC | PRN

## 2022-12-06 MED ORDER — LOSARTAN POTASSIUM 100 MG PO TABS: 100.0000 mg | ORAL_TABLET | Freq: Every day | ORAL | 1 refills | Status: DC

## 2022-12-06 NOTE — Assessment & Plan Note (Signed)
Labs ordered at visit today.  Will make recommendations based on lab results.   

## 2022-12-06 NOTE — Assessment & Plan Note (Signed)
Chronic.  Controlled.  Continue with current medication regimen.  Labs ordered today.  Return to clinic in 6 months for reevaluation.  Call sooner if concerns arise.  ? ?

## 2022-12-06 NOTE — Assessment & Plan Note (Signed)
Chronic. Controlled.  Continue with Amlodipine daily.   Keep checking blood pressures at home.  Bring log to next visit.  Follow up in 3 month.  Call sooner if concerns arise.

## 2022-12-06 NOTE — Assessment & Plan Note (Signed)
Chronic.  Ongoing.  Continue with Spiriva.  Continue with PRN albuterol.  Follow up in 6 months.  Call sooner if concerns arise.

## 2022-12-06 NOTE — Progress Notes (Unsigned)
BP (!) 153/77   Pulse 87   Temp 97.9 F (36.6 C)   Wt 114 lb 9.6 oz (52 kg)   LMP  (LMP Unknown)   BMI 19.67 kg/m    Subjective:    Patient ID: Monique Tucker, female    DOB: [birth year], 62 y.o.   MRN: [id number]  HPI: Monique Tucker is a 61 y.o. female  Chief Complaint  Patient presents with   Medication Mangement    HYPERTENSION / HYPERLIPIDEMIA Satisfied with current treatment? yes Duration of hypertension: years BP monitoring frequency: not checking BP range:  BP medication side effects: no Past BP meds: Metoprolol, amlodipine, and Losartan Duration of hyperlipidemia: years Cholesterol medication side effects: no Cholesterol supplements: none Past cholesterol medications: none Medication compliance: good compliance Aspirin: no Recent stressors: no Recurrent headaches: no Visual changes: no Palpitations: no Dyspnea: no Chest pain: no Lower extremity edema: no Dizzy/lightheaded: no  MOOD Followed by Psychiatry.  She has been out of her Seroquel recently.  She continues to follow up with them.    COPD Doing well with Spiriva and Advair. COPD status: controlled Satisfied with current treatment?: no Oxygen use: no Dyspnea frequency:  Cough frequency: when she smokes and doesn't use her inhaler Rescue inhaler frequency:   Limitation of activity: no Productive cough:  Last Spirometry:  Pneumovax: Up to Date Influenza: Up to Date She is still cutting back on her smoking.    Patient has completed Hep C treatment.  She finished it last week.   Relevant past medical, surgical, family and social history reviewed and updated as indicated. Interim medical history since our last visit reviewed. Allergies and medications reviewed and updated.  Review of Systems  Eyes:  Negative for visual disturbance.  Respiratory:  Negative for cough, chest tightness and shortness of breath.   Cardiovascular:  Negative for chest pain, palpitations and leg swelling.   Neurological:  Negative for dizziness and headaches.  Psychiatric/Behavioral:  Positive for dysphoric mood. Negative for suicidal ideas. The patient is nervous/anxious.     Per HPI unless specifically indicated above     Objective:    BP (!) 153/77   Pulse 87   Temp 97.9 F (36.6 C)   Wt 114 lb 9.6 oz (52 kg)   LMP  (LMP Unknown)   BMI 19.67 kg/m   Wt Readings from Last 3 Encounters:  12/06/22 114 lb 9.6 oz (52 kg)  07/18/22 108 lb (49 kg)  03/03/22 105 lb 4.8 oz (47.8 kg)    Physical Exam Vitals and nursing note reviewed.  Constitutional:      General: She is not in acute distress.    Appearance: Normal appearance. She is normal weight. She is not ill-appearing, toxic-appearing or diaphoretic.  HENT:     Head: Normocephalic.     Right Ear: External ear normal.     Left Ear: External ear normal.     Nose: Nose normal.     Mouth/Throat:     Mouth: Mucous membranes are moist.     Pharynx: Oropharynx is clear.  Eyes:     General:        Right eye: No discharge.        Left eye: No discharge.     Extraocular Movements: Extraocular movements intact.     Conjunctiva/sclera: Conjunctivae normal.     Pupils: Pupils are equal, round, and reactive to light.  Cardiovascular:     Rate and Rhythm: Normal rate and regular rhythm.  Heart sounds: No murmur heard. Pulmonary:     Effort: Pulmonary effort is normal. No respiratory distress.     Breath sounds: Normal breath sounds. No wheezing or rales.  Musculoskeletal:     Cervical back: Normal range of motion and neck supple.  Skin:    General: Skin is warm and dry.     Capillary Refill: Capillary refill takes less than 2 seconds.  Neurological:     General: No focal deficit present.     Mental Status: She is alert and oriented to person, place, and time. Mental status is at baseline.  Psychiatric:        Mood and Affect: Mood normal.        Behavior: Behavior normal.        Thought Content: Thought content normal.         Judgment: Judgment normal.     Results for orders placed or performed in visit on 12/06/22  Comp Met (CMET)  Result Value Ref Range   Glucose 143 (H) 70 - 99 mg/dL   BUN 10 8 - 27 mg/dL   Creatinine, Ser 4.09 0.57 - 1.00 mg/dL   eGFR 69 >81 XB/JYN/8.29   BUN/Creatinine Ratio 11 (L) 12 - 28   Sodium 140 134 - 144 mmol/L   Potassium 3.7 3.5 - 5.2 mmol/L   Chloride 102 96 - 106 mmol/L   CO2 21 20 - 29 mmol/L   Calcium 9.3 8.7 - 10.3 mg/dL   Total Protein 7.1 6.0 - 8.5 g/dL   Albumin 4.5 3.9 - 4.9 g/dL   Globulin, Total 2.6 1.5 - 4.5 g/dL   Albumin/Globulin Ratio 1.7    Bilirubin Total 0.2 0.0 - 1.2 mg/dL   Alkaline Phosphatase 135 (H) 44 - 121 IU/L   AST 26 0 - 40 IU/L   ALT 19 0 - 32 IU/L  Lipid Profile  Result Value Ref Range   Cholesterol, Total 163 100 - 199 mg/dL   Triglycerides 562 0 - 149 mg/dL   HDL 59 >13 mg/dL   VLDL Cholesterol Cal 18 5 - 40 mg/dL   LDL Chol Calc (NIH) 86 0 - 99 mg/dL   Chol/HDL Ratio 2.8 0.0 - 4.4 ratio  HgB A1c  Result Value Ref Range   Hgb A1c MFr Bld 5.4 4.8 - 5.6 %   Est. average glucose Bld gHb Est-mCnc 108 mg/dL  Hepatitis C Antibody  Result Value Ref Range   Hep C Virus Ab Reactive (A) Non Reactive      Assessment & Plan:   Problem List Items Addressed This Visit       Cardiovascular and Mediastinum   Hypertension - Primary    Chronic. Controlled.  Continue with Amlodipine daily.   Increased dose of Losartan to 100mg .  Keep checking blood pressures at home.  Bring log to next visit.  Follow up in 2 month.  Call sooner if concerns arise.       Relevant Medications   losartan (COZAAR) 100 MG tablet   Other Relevant Orders   Comp Met (CMET) (Completed)     Respiratory   Moderate COPD (chronic obstructive pulmonary disease) (HCC)    Chronic.  Ongoing.  Continue with Spiriva.  Continue with PRN albuterol.  Follow up in 6 months.  Call sooner if concerns arise.       Relevant Medications   Tiotropium Bromide Monohydrate  (SPIRIVA RESPIMAT) 2.5 MCG/ACT AERS   albuterol (VENTOLIN HFA) 108 (90 Base) MCG/ACT inhaler  fluticasone-salmeterol (ADVAIR) 250-50 MCG/ACT AEPB     Digestive   Acute hepatitis C   Relevant Orders   Hepatitis C Antibody (Completed)   Hepatocellular carcinoma (HCC)    Chronic. Followed by Oncology.  Continue with collaboration.          Other   Prediabetes    Labs ordered at visit today.  Will make recommendations based on lab results.         Relevant Orders   HgB A1c (Completed)   Hypercholesterolemia    Chronic.  Controlled.  Continue with current medication regimen.  Labs ordered today.  Return to clinic in 6 months for reevaluation.  Call sooner if concerns arise.        Relevant Medications   losartan (COZAAR) 100 MG tablet   Other Relevant Orders   Lipid Profile (Completed)     Follow up plan: Return in about 2 months (around 02/05/2023) for BP Check.

## 2022-12-06 NOTE — Assessment & Plan Note (Signed)
Chronic. Followed by Oncology.  Continue with collaboration.   

## 2022-12-07 LAB — COMPREHENSIVE METABOLIC PANEL
ALT: 19 IU/L (ref 0–32)
AST: 26 IU/L (ref 0–40)
Albumin/Globulin Ratio: 1.7
Albumin: 4.5 g/dL (ref 3.9–4.9)
Alkaline Phosphatase: 135 IU/L — ABNORMAL HIGH (ref 44–121)
BUN/Creatinine Ratio: 11 — ABNORMAL LOW (ref 12–28)
BUN: 10 mg/dL (ref 8–27)
Bilirubin Total: 0.2 mg/dL (ref 0.0–1.2)
CO2: 21 mmol/L (ref 20–29)
Calcium: 9.3 mg/dL (ref 8.7–10.3)
Chloride: 102 mmol/L (ref 96–106)
Creatinine, Ser: 0.93 mg/dL (ref 0.57–1.00)
Globulin, Total: 2.6 g/dL (ref 1.5–4.5)
Glucose: 143 mg/dL — ABNORMAL HIGH (ref 70–99)
Potassium: 3.7 mmol/L (ref 3.5–5.2)
Sodium: 140 mmol/L (ref 134–144)
Total Protein: 7.1 g/dL (ref 6.0–8.5)
eGFR: 69 mL/min/{1.73_m2} (ref >59)

## 2022-12-07 LAB — HEPATITIS C ANTIBODY: Hep C Virus Ab: REACTIVE — AB

## 2022-12-07 LAB — HEMOGLOBIN A1C
Est. average glucose Bld gHb Est-mCnc: 108 mg/dL
Hgb A1c MFr Bld: 5.4 % (ref 4.8–5.6)

## 2022-12-07 LAB — LIPID PANEL
Chol/HDL Ratio: 2.8 ratio (ref 0.0–4.4)
Cholesterol, Total: 163 mg/dL (ref 100–199)
HDL: 59 mg/dL (ref >39)
LDL Chol Calc (NIH): 86 mg/dL (ref 0–99)
Triglycerides: 101 mg/dL (ref 0–149)
VLDL Cholesterol Cal: 18 mg/dL (ref 5–40)

## 2022-12-07 NOTE — Progress Notes (Signed)
HI Monique Tucker. It was nice to see you yesterday.  Your lab work looks good.  Your Hep C does say reactive but likely because you have had a past infection.  Otherwise, No concerns at this time. Continue with your current medication regimen.  Follow up as discussed.  Please let me know if you have any questions.

## 2022-12-09 ENCOUNTER — Other Ambulatory Visit: Payer: Self-pay | Admitting: Nurse Practitioner

## 2022-12-09 NOTE — Telephone Encounter (Signed)
Requested Prescriptions  Pending Prescriptions Disp Refills   potassium chloride SA (KLOR-CON M) 20 MEQ tablet [Pharmacy Med Name: POTASSIUM CHLORIDE ER ER TABLET ER] 90 tablet 3    Sig: TAKE ONE TABLET BY MOUTH ONCE A DAY     Endocrinology:  Minerals - Potassium Supplementation Passed - 12/09/2022 11:19 AM      Passed - K in normal range and within 360 days    Potassium  Date Value Ref Range Status  12/06/2022 3.7 3.5 - 5.2 mmol/L Final         Passed - Cr in normal range and within 360 days    Creatinine, Ser  Date Value Ref Range Status  12/06/2022 0.93 0.57 - 1.00 mg/dL Final         Passed - Valid encounter within last 12 months    Recent Outpatient Visits           3 days ago Primary hypertension   [place] [facility], [facility]   4 months ago Annual physical exam   [place] [facility] Larae Grooms, NP   8 months ago COPD exacerbation [facility])   [place] [facility], Corrie Dandy T, NP   9 months ago Chronic pain of right knee   [place] [facility] Larae Grooms, NP   11 months ago Moderate COPD (chronic obstructive pulmonary disease) (HCC)   [place] [facility] Larae Grooms, NP       Future Appointments             In 1 month Mecum, Oswaldo Conroy, PA-C  [facility], [facility]

## 2022-12-19 ENCOUNTER — Telehealth: Payer: Self-pay | Admitting: Nurse Practitioner

## 2022-12-19 ENCOUNTER — Ambulatory Visit: Payer: Self-pay

## 2022-12-19 NOTE — Telephone Encounter (Signed)
Medication Refill - Medication: pt I in [place] and cants get to the pharmacy in [place]  Oxcarbazepine 150 Potassium chloride 20 neq ER Methocarbamol  She also says her rt hip is hurting .  She is wanting something for pain   Has the patient contacted their pharmacy? Yes.   (Agent: If no, request that the patient contact the pharmacy for the refill. If patient does not wish to contact the pharmacy document the reason why and proceed with request.) (Agent: If yes, when and what did the pharmacy advise?)  Preferred Pharmacy (with phone number or street name): [facility]  [address]  [place].  [address]  [phone]  Has the patient been seen for an appointment in the last year OR does the patient have an upcoming appointment? Yes  CB# Patient  [phone]  Agent: Please be advised that RX refills may take up to 3 business days. We ask that you follow-up with your pharmacy.

## 2022-12-19 NOTE — Telephone Encounter (Signed)
Called patient to discuss refill request. Patient stated that after she called she counted her pills for potassium and methocarbamol and she has enough to get her through her vacation. She also realized that her PCP did not prescribe the oxcarbazepine and she called that other office for the refill request. She no longer needs any of the medication that was requested during the call. Patient was triaged for hip pain. See triage encounter for details.

## 2022-12-19 NOTE — Telephone Encounter (Signed)
Chief Complaint: Hip pain Symptoms: Bilateral hip pain right over left radiates down the back of the leg to the foot. Frequency: daily with increased activity Pertinent Negatives: Patient denies calf pain, SOB, and other symptoms.  Disposition: [] ED /[x] Urgent Care (no appt availability in office) / [] Appointment(In office/virtual)/ []  [place] [facility]/ [] Home Care/ [x] Refused Recommended Disposition /[] [place] [facility]/ []  Follow-up with PCP Additional Notes: Patient in [place] on vacation with family stated that her right hip is hurting when walking on the beach or playing in the pool with granddaughter. Patient states the pain is making it difficult to walk at this time. Advised patient to go to the local urgent care or ED. Patient declined because she is afraid insurance will not cover the cost. Advised patient to call the local urgent care to ask if insurance will pay. Patient stated she would call.  Advised I would send message to provider for any additional recommendations and someone will call her back with any more information.  Reason for Disposition  [1] SEVERE pain (e.g., excruciating, unable to do any normal activities) AND [2] not improved after 2 hours of pain medicine  Answer Assessment - Initial Assessment Questions 1. LOCATION and RADIATION: "Where is the pain located?"      Bilateral hip pain radiating down to the foot 2. QUALITY: "What does the pain feel like?"  (e.g., sharp, dull, aching, burning)     Aching constant  3. SEVERITY: "How bad is the pain?" "What does it keep you from doing?"   (Scale 1-10; or mild, moderate, severe)   -  MILD (1-3): doesn't interfere with normal activities    -  MODERATE (4-7): interferes with normal activities (e.g., work or school) or awakens from sleep, limping    -  SEVERE (8-10): excruciating pain, unable to do any normal activities, unable to walk     8-10 pain  4. ONSET: "When did the pain start?" "Does it come and go, or is  it there all the time?"     2 days ago. Couldn't play with granddaughter at all 5. WORK OR EXERCISE: "Has there been any recent work or exercise that involved this part of the body?"      Walking more, on a beach vacation 6. CAUSE: "What do you think is causing the hip pain?"      Increased active 7. AGGRAVATING FACTORS: "What makes the hip pain worse?" (e.g., walking, climbing stairs, running)     walking 8. OTHER SYMPTOMS: "Do you have any other symptoms?" (e.g., back pain, pain shooting down leg,  fever, rash)     Shooting down the leg  Protocols used: Hip Pain-A-AH

## 2022-12-23 DIAGNOSIS — K7682 Hepatic encephalopathy (CMS-HCC): Principal | ICD-10-CM

## 2022-12-23 MED ORDER — RIFAXIMIN 550 MG TABLET
ORAL_TABLET | Freq: Two times a day (BID) | ORAL | 0 refills | 30 days | Status: CP
Start: 2022-12-23 — End: 2023-01-22

## 2022-12-23 NOTE — Unmapped (Signed)
Please refill if appropriate.     Most recent clinic visit: 09/30/2022  Next clinic visit: 01/06/2023

## 2023-01-05 NOTE — Unmapped (Unsigned)
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 but otherwise well compensated  - Continue rifaximin 550 mg BID  - EGD 04/2023    # HCV: s/p  Mavyret x 8 weeks started 11/15/21 with end date 01/09/22 *******  -SVR check *********     # HTN: higher BP today; recent decrease in regimen  -Follow up with PCP to discuss adding back on meds\  -Consider carvedilol 6.25 mg BID in future    {hstime:94516}    Tawni Carnes, MD, MPH  Assistant Professor of Medicine  [facility]  [facility]  Division of Gastroenterology and Hepatology      Subjective   History of Present Illness   {Accompanied [phone]    62 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    Loopiness ********. Taking benzos **********. Started on rifaximin last visit. Previously on lactulose but unable to tolerate it.    Ascites ****. Current diuretics furosemide **** mg daily and spironolactone **** mg daily.  Hepatic encephalopathy ****. Currently on lactulose taken *** x/day, titrated to ***-*** BMs and rifaximin 550 mg BID.  Variceal bleeding *********. Currently **** on carvedilol 6.25 mg BID.    Other symptoms *********  ECOG performance status *************    Alcohol use ********  Tobacco use ********    MRI 09/30/22:  - LR-TR equivocal: Segment 6 lesion status post repeat TACE with peripheral enhancement. No definite nodular enhancement.   - LR-3: Multiple observations in segment 7 and 8 as described, possibly perfusional. Continued attention on follow-up exams.       Objective   Physical Exam   Vital Signs: There were no vitals taken for this visit.  Constitutional: She is in no apparent distress  Eyes: Anicteric sclerae  Cardiovascular: No peripheral edema  Gastrointestinal: Soft, nontender abdomen without hepatosplenomegaly, hernias, or masses  Neurologic: Awake, alert, and oriented to person, place, and time with normal speech and no asterixis    {Labs/Imaging/Pathology (Optional):104318}    {Optional documentation elements (Optional):94651}

## 2023-01-06 ENCOUNTER — Ambulatory Visit: Admit: 2023-01-06 | Payer: PRIVATE HEALTH INSURANCE

## 2023-01-09 DIAGNOSIS — B182 Chronic viral hepatitis C: Principal | ICD-10-CM

## 2023-01-10 ENCOUNTER — Other Ambulatory Visit: Payer: Self-pay | Admitting: Nurse Practitioner

## 2023-01-10 NOTE — Unmapped (Signed)
[facility] - Enhanced Care Program  Reason for Call: Lab; Type: Reminder  Referral Request: Hepatology - Park Breed, CPP    Summary of Telephone Encounter  Pt was treated with Mavyret x 8 wks (11/15/21 - 01/09/22) and will need lab work to assess for cure.   Park Breed, CPP entered lab orders for pt to have [facility]   I was not able to reach pt at phone # [phone]. I left a VM request for pt to please return my call. Contact number provided.   Follow-Up:  Continue to reach out to pt to assist with lab work to assess for cure.     Call Attempt #: 1  Time Spent on Referral: 10 minutes  Number of Days Spent on Referral: 1    Aldean Ast, [facility]   Pharmacy Department  [facility]  [address]   [place], [place] [address]  [phone]

## 2023-01-11 NOTE — Unmapped (Signed)
Patient Robin Velez was called in attempt number 1 to reach them regarding rescheduling their upcoming appointments on Next avilable.     The call resulted in: Message left    Please reschedule the patient upon their return call on Next available for the following:     Lab Appointment, Provider, and Scan(s) 1-7 days prior to appointment    Thank you,    Alexus Simeon Craft

## 2023-01-11 NOTE — Telephone Encounter (Signed)
Requested medication (s) are due for refill today: Yes  Requested medication (s) are on the active medication list: Yes  Last refill:  11/29/22  Future visit scheduled: Yes  Notes to clinic:  Not delegated.    Requested Prescriptions  Pending Prescriptions Disp Refills   methocarbamol (ROBAXIN) 500 MG tablet [Pharmacy Med Name: METHOCARBAMOL 500MG  TABLET] 60 tablet 0    Sig: TAKE 1 TABLET BY MOUTH 4 TIMES A DAY     Not Delegated - Analgesics:  Muscle Relaxants Failed - 01/10/2023 12:38 PM      Failed - This refill cannot be delegated      Passed - Valid encounter within last 6 months    Recent Outpatient Visits           1 month ago Primary hypertension   [place] [facility] Larae Grooms, NP   5 months ago Annual physical exam   [place] [facility] Larae Grooms, NP   9 months ago COPD exacerbation [facility])   [place] [facility] [place], Corrie Dandy T, NP   10 months ago Chronic pain of right knee   [place] [facility] Larae Grooms, NP   12 months ago Moderate COPD (chronic obstructive pulmonary disease) (HCC)   [place] [facility] Larae Grooms, NP       Future Appointments             In 3 weeks Mecum, Oswaldo Conroy, PA-C [facility], [facility]

## 2023-01-25 DIAGNOSIS — K7682 Hepatic encephalopathy (CMS-HCC): Principal | ICD-10-CM

## 2023-01-25 MED ORDER — RIFAXIMIN 550 MG TABLET
ORAL_TABLET | Freq: Two times a day (BID) | ORAL | 10 refills | 30 days | Status: CP
Start: 2023-01-25 — End: 2023-12-21

## 2023-01-25 NOTE — Unmapped (Signed)
[facility] - Enhanced Care Program  Reason for Call: Lab; Type: Reminder  Referral Request: Hepatology - Park Breed, CPP    Summary of Telephone Encounter  Pt was treated with Mavyret x 8 wks (11/15/21 - 01/09/22) and will need lab work to assess for cure.   Park Breed, CPP entered lab orders for pt to have [facility] at [facility]   I was able to speak with pt at phone # [phone]. She stated she had labs completed at PCP office at [facility]. I checked care everywhere and pt had labs completed on 12/06/22. I let pt know they drew Hepatitis C Antibody but that was the incorrect lab. I let pt know her antibody will always be reactive because she has had Hepatitis C. I explained that we are looking for the actual virus count and if it is < 12 not detected we will know she is cured. If it is detectable she will need re treatment.   Pt left a VM at the office today so she can be rescheduled for apt and testing. I provided the phone number to scheduling  [phone] option 1).  Pt also stated she needs a new prescription for Xifaxan.   I will fax lab orders over to pt's PCP per pt request and follow up on results. I will ensure pt has been rescheduled with Dr. Waynetta Sandy, and received prescription for Xifaxan.   Follow-Up:  Assist with coordination of lab work and follow up on results. Once they are received I will notify Park Breed, CPP and close out the referral.     Call Attempt #: 2  Time Spent on Referral: 30 minutes  Number of Days Spent on Referral: 2    Vertell Limber RN, BSN  Nursing [facility]   Pharmacy Department  [facility]  [address]   [place], [place] [address]  [phone]

## 2023-01-26 DIAGNOSIS — K7682 Hepatic encephalopathy (CMS-HCC): Principal | ICD-10-CM

## 2023-01-26 MED ORDER — RIFAXIMIN 550 MG TABLET
ORAL_TABLET | Freq: Two times a day (BID) | ORAL | 10 refills | 30 days | Status: CP
Start: 2023-01-26 — End: 2023-12-22
  Filled 2023-02-06: qty 60, 30d supply, fill #0

## 2023-01-26 NOTE — Unmapped (Signed)
[facility] - Enhanced Care Program  Reason for Call: Lab; Type: Reminder  Referral Request: Hepatology - Park Breed, CPP     Summary of Telephone Encounter  I placed a follow up call with pt at phone # [phone]. I let pt know Dr. Waynetta Sandy placed a new rx for Xifaxan but she will need to reschedule apt with him. I let her know she can cal [facility] at phone # [phone] option 4 then option 4 to schedule her refill.   I also placed a conference call with pt on to scheduling at the GI Clinic at the [facility] [phone]) so she can be scheduled for apt with Dr. Waynetta Sandy.  Pt can complete lab orders at PCP office or wait and complete labs when she comes in for her apt with Dr. Waynetta Sandy.   Follow-Up:  Once lab work has been completed referral will be closed.      Call Attempt #: 2  Time Spent on Referral: 30 minutes  Number of Days Spent on Referral: 2     Vertell Limber RN, BSN  Nursing [facility]   Pharmacy Department  [facility]  [address]   [place], [place] [address]  [phone]

## 2023-01-31 ENCOUNTER — Other Ambulatory Visit: Payer: Self-pay | Admitting: Nurse Practitioner

## 2023-02-01 NOTE — Telephone Encounter (Signed)
Unable to refill per protocol, rx request is too soon. Last refill 12/06/22 for 90 and 1 refill.  Requested Prescriptions  Pending Prescriptions Disp Refills   losartan (COZAAR) 50 MG tablet [Pharmacy Med Name: LOSARTAN POTASSIUM 50MG  TABLET] 90 tablet 0    Sig: TAKE ONE TABLET (50 MG TOTAL) BY MOUTH DAILY.     Cardiovascular:  Angiotensin Receptor Blockers Failed - 01/31/2023  4:15 PM      Failed - Last BP in normal range    BP Readings from Last 1 Encounters:  12/06/22 (!) 153/77         Passed - Cr in normal range and within 180 days    Creatinine, Ser  Date Value Ref Range Status  12/06/2022 0.93 0.57 - 1.00 mg/dL Final         Passed - K in normal range and within 180 days    Potassium  Date Value Ref Range Status  12/06/2022 3.7 3.5 - 5.2 mmol/L Final         Passed - Patient is not pregnant      Passed - Valid encounter within last 6 months    Recent Outpatient Visits           1 month ago Primary hypertension   [place] [facility] Larae Grooms, NP   6 months ago Annual physical exam   [place] [facility] Larae Grooms, NP   9 months ago COPD exacerbation [facility])   [place] [facility] [place], Corrie Dandy T, NP   11 months ago Chronic pain of right knee   [place] [facility] Larae Grooms, NP   1 year ago Moderate COPD (chronic obstructive pulmonary disease) (HCC)   [place] [facility] Larae Grooms, NP       Future Appointments             In 5 days Mecum, Oswaldo Conroy, PA-C [facility], [facility]

## 2023-02-03 NOTE — Unmapped (Signed)
[facility] Refill Coordination Note    Specialty Lite Medication(s) to be Shipped:   Infectious Disease: Xifaxan    Other medication(s) to be shipped: No additional medications requested for fill at this time     Robin Velez, DOB: [birth year]  Phone: There are no phone numbers on file.      All above HIPAA information was verified with patient.     Was a Nurse, learning disability used for this call? No    Changes to medications: [name] reports no changes at this time.   Changes to insurance: No      REFERRAL TO PHARMACIST     Referral to the pharmacist: Not needed      [facility]     Shipping address confirmed in Epic.     Delivery Scheduled: Yes, Expected medication delivery date: 8/12.     Medication will be delivered via UPS to the prescription address in Epic WAM.    Alwyn Pea   [facility] Specialty Technician

## 2023-02-06 ENCOUNTER — Ambulatory Visit: Payer: Medicaid Other | Admitting: Physician Assistant

## 2023-02-22 ENCOUNTER — Other Ambulatory Visit: Payer: Self-pay | Admitting: Nurse Practitioner

## 2023-02-23 NOTE — Telephone Encounter (Signed)
Requested Prescriptions  Pending Prescriptions Disp Refills   amLODipine (NORVASC) 2.5 MG tablet [Pharmacy Med Name: AMLODIPINE BESYLATE 2.5MG  TABLET] 90 tablet 0    Sig: TAKE ONE TABLET BY MOUTH ONCE A DAY. TO TAKE IN CONJUNCTION WITH 5 MG TABLET TO = A DOSE OF 7.5 MG     Cardiovascular: Calcium Channel Blockers 2 Failed - 02/22/2023 12:52 PM      Failed - Last BP in normal range    BP Readings from Last 1 Encounters:  12/06/22 (!) 153/77         Passed - Last Heart Rate in normal range    Pulse Readings from Last 1 Encounters:  12/06/22 87         Passed - Valid encounter within last 6 months    Recent Outpatient Visits           2 months ago Primary hypertension   [facility] Larae Grooms, NP   7 months ago Annual physical exam   [place] [facility] Larae Grooms, NP   10 months ago COPD exacerbation [facility])   [place] [facility] [place], Corrie Dandy T, NP   11 months ago Chronic pain of right knee   [place] [facility] Larae Grooms, NP   1 year ago Moderate COPD (chronic obstructive pulmonary disease) [facility])   [place] [facility] Larae Grooms, NP

## 2023-03-13 NOTE — Unmapped (Signed)
[facility] Specialty and Home Delivery Pharmacy Refill Coordination Note    Specialty Lite Medication(s) to be Shipped:   Xifaxan 550mg     Other medication(s) to be shipped: No additional medications requested for fill at this time     Robin Velez, DOB: 09/07/1960  Phone: There are no phone numbers on file.      All above HIPAA information was verified with patient.     Was a Nurse, learning disability used for this call? No    Changes to medications: Leronda reports no changes at this time.  Changes to insurance: No      REFERRAL TO PHARMACIST     Referral to the pharmacist: Not needed      [facility]     Shipping address confirmed in Epic.     Delivery Scheduled: Yes, Expected medication delivery date: 03/15/23.     Medication will be delivered via UPS to the prescription address in Epic WAM.    Jasper Loser   [facility] Specialty and Home Delivery Pharmacy Specialty Technician

## 2023-03-15 MED FILL — XIFAXAN 550 MG TABLET: ORAL | 30 days supply | Qty: 60 | Fill #1

## 2023-03-30 NOTE — Unmapped (Unsigned)
[facility]  03/31/2023    Reason for visit: {New/Return/Post:94740}    Assessment/Plan:    62 y.o. 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 - reported *** significant confusion (losing several debit cards)  -EGD 04/2023     # HCV: s/p  Mavyret x 8 weeks started 11/15/21 with end date 01/09/22   -SVR check with PCP ******** vs today     # HTN: higher BP today; recent decrease in regimen  -Follow up with PCP to discuss adding back on meds\  -Consider carvedilol 6.25 mg BID in future      {hstime:94516}    Tawni Carnes, MD, MPH  Assistant Professor of Medicine  [facility]  [facility]  Division of Gastroenterology and Hepatology      Subjective   History of Present Illness   {Accompanied [phone]    62 y.o. 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 Last scan with trace ascites. Current diuretics furosemide **** mg daily and spironolactone **** mg daily.  Hepatic encephalopathy ****. 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 This was started **** with ***** improvement in symptoms of covert HE. Intolerant to lactulose in past.  Variceal bleeding *********. EGD due in 04/2023.    Treatment of HCV *******    Discussed potential transplant evaluation during last visit and she stated she was too overwhelmed to consider ***********    Other symptoms *********  ECOG performance status *************    Alcohol use ********  Tobacco use ********    Objective   Physical Exam   Vital Signs: There were no vitals taken for this visit.  Constitutional: She is in no apparent distress  Eyes: Anicteric sclerae  Cardiovascular: No peripheral edema  Gastrointestinal: Soft, nontender abdomen without hepatosplenomegaly, hernias, or masses  Neurologic: Awake, alert, and oriented to person, place, and time with normal speech and no asterixis    {Labs/Imaging/Pathology (Optional):104318}    {Optional documentation elements (Optional):94651}

## 2023-03-31 ENCOUNTER — Ambulatory Visit: Admit: 2023-03-31 | Discharge: 2023-03-31 | Payer: PRIVATE HEALTH INSURANCE

## 2023-03-31 ENCOUNTER — Other Ambulatory Visit: Admit: 2023-03-31 | Discharge: 2023-03-31 | Payer: PRIVATE HEALTH INSURANCE

## 2023-03-31 DIAGNOSIS — C220 Liver cell carcinoma: Principal | ICD-10-CM

## 2023-03-31 DIAGNOSIS — K7210 Chronic hepatic failure without coma: Principal | ICD-10-CM

## 2023-03-31 DIAGNOSIS — K769 Liver disease, unspecified: Secondary | ICD-10-CM | POA: Diagnosis not present

## 2023-03-31 LAB — CBC
HEMATOCRIT: 42.5 % (ref 34.0–44.0)
HEMOGLOBIN: 14.5 g/dL (ref 11.3–14.9)
MEAN CORPUSCULAR HEMOGLOBIN CONC: 34.1 g/dL (ref 32.0–36.0)
MEAN CORPUSCULAR HEMOGLOBIN: 33.2 pg — ABNORMAL HIGH (ref 25.9–32.4)
MEAN CORPUSCULAR VOLUME: 97.1 fL — ABNORMAL HIGH (ref 77.6–95.7)
MEAN PLATELET VOLUME: 7.6 fL (ref 6.8–10.7)
PLATELET COUNT: 157 10*9/L (ref 150–450)
RED BLOOD CELL COUNT: 4.37 10*12/L (ref 3.95–5.13)
RED CELL DISTRIBUTION WIDTH: 13.2 % (ref 12.2–15.2)
WBC ADJUSTED: 4.1 10*9/L (ref 3.6–11.2)

## 2023-03-31 LAB — COMPREHENSIVE METABOLIC PANEL
ALBUMIN: 4.1 g/dL (ref 3.4–5.0)
ALKALINE PHOSPHATASE: 125 U/L — ABNORMAL HIGH (ref 46–116)
ALT (SGPT): 23 U/L (ref 10–49)
ANION GAP: 5 mmol/L (ref 5–14)
AST (SGOT): 25 U/L (ref <=34)
BILIRUBIN TOTAL: 0.3 mg/dL (ref 0.3–1.2)
BLOOD UREA NITROGEN: 20 mg/dL (ref 9–23)
BUN / CREAT RATIO: 25
CALCIUM: 9.5 mg/dL (ref 8.7–10.4)
CHLORIDE: 110 mmol/L — ABNORMAL HIGH (ref 98–107)
CO2: 27 mmol/L (ref 20.0–31.0)
CREATININE: 0.8 mg/dL
EGFR CKD-EPI (2021) FEMALE: 83 mL/min/{1.73_m2} (ref >=60)
GLUCOSE RANDOM: 101 mg/dL (ref 70–179)
POTASSIUM: 4.6 mmol/L (ref 3.5–5.1)
PROTEIN TOTAL: 6.9 g/dL (ref 5.7–8.2)
SODIUM: 142 mmol/L (ref 135–145)

## 2023-03-31 LAB — AFP TUMOR MARKER: AFP-TUMOR MARKER: 31 ng/mL — ABNORMAL HIGH (ref <=8)

## 2023-03-31 LAB — PROTIME-INR
INR: 0.93
PROTIME: 10.4 s (ref 9.9–12.6)

## 2023-03-31 MED ADMIN — gadobenate dimeglumine (MULTIHANCE) 529 mg/mL (0.1mmol/0.2mL) solution 5 mL: 5 mL | INTRAVENOUS | @ 14:00:00 | Stop: 2023-03-31

## 2023-03-31 NOTE — Unmapped (Signed)
Blood drawn using 23 ga butterfly needle to LAC successful x1 attempt. Pt tolerated without difficulty. Blood work tubed to lab.Pt left [facility] clinic ambulatory.

## 2023-03-31 NOTE — Unmapped (Signed)
-   We will start the transplant evaluation process and have you follow up with the social worker  - We have also placed a referral for the smoking cessation counseling.   - I have also placed a referral for an EGD. Call [phone] option 1 for appointments then option 2 for procedure appointments to schedule an endoscopy  - Eat a high protein diet (1.2-1.5 grams per kg body weight per day, typically about 100 grams of protein a day). Eat small, frequent meals, high-protein late-night snacks, and use protein supplements such as Ensure or Boost.      How to reach me:    MyChart Message to me or nurse Ted Mcalpine: for non-emergency questions; may take up to 48-72 hours to respond    Fax GI clinic: [phone]  Fax Multidisciplinary [facility] Clinic: [phone]      Important Contact Numbers:    GI Clinic Appointments:? [phone]  Please call the GI Clinic appointment line if you need to schedule, reschedule or cancel an appointment in clinic. They can also answer any questions you may have about where your appointment is located and when you need to arrive.    GI Procedure Appointments:?[phone]  Please call the GI Procedures line if you need to schedule, reschedule or cancel ANY type of procedure (EGD, colonoscopy, motility testing, etc). You can also call this number for prep instructions, etc.?    Radiology: [phone], option 3 or 4  If you are being scheduled for any type of radiology, you will need to call to receive your appointment time.? Please call this number for information.?    For emergencies after normal business hours or on weekends/holidays  Proceed to the nearest emergency room OR contact the [facility] Operator at [phone] who can page the Gastroenterology Fellow on call.    Financial Assistance: [phone] or email [email or web address]      Pharmacy Assistance: [phone]

## 2023-03-31 NOTE — Unmapped (Signed)
On March 31, 2023 received referral from Cheryln Manly* and  Dr. Waynetta Sandy for liver transplant evaluation. Robin Velez is a 62 y.o. 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 Referral entered in EPIC. Request will be sent to the [facility] for insurance verification.

## 2023-04-01 LAB — HEPATITIS C RNA, QUANTITATIVE, PCR: HCV RNA: NOT DETECTED

## 2023-04-03 MED ORDER — NICOTINE (POLACRILEX) 4 MG GUM
BUCCAL | 2 refills | 5 days | Status: CP | PRN
Start: 2023-04-03 — End: 2023-05-03

## 2023-04-03 NOTE — Unmapped (Signed)
Counseled Robin Velez, 62 y.o. for treatment of tobacco use/dependence. Pt was referred by Marene Lenz, MD and Weldon Picking, MD in the Multi-Disciplinary Oncology clinic.     SUMMARY: Counselor assessed pt's current tobacco use and history of use, and used MI to elicit motivation. Counselor provided evidence-based treatment for tobacco use, including discussing triggers, behavioral strategies, and tobacco cessation medications. Pt endorsed current daily tobacco use of 1ppd and expressed interest in quitting. Pt identified feelings of anxiety and stress as common triggers for smoking behaviors. Pt expressed disappointment with her ability to work towards smoking cessation in the past and stated no matter what I just can't seem to get it right. Pt reflected on past experiences with quitting smoking and shared that she had been able to quit easily while pregnant. Pt discussed feelings of anxiety regarding liver transplant surgery as a barrier for reducing current tobacco use. Counselor and pt discussed psychoeducation regarding mindfulness skill building and opportunties to distract and disrupt stress-induced triggers to smoke such as calling a friend. Pt endorsed benefits of behavioral friction strategy used in the past while quitting smoking including using a fake cigarette to manage hand-to-mouth behaviors for smoking. Pt requested information regarding pharmacotherapy options for smoking cessation. Counselor and pt discussed education regarding standard-of-care medications for tobacco cessation, including applications, side-effects, and efficacies of nicotine patches and nicotine gum in managing long and short-acting cravings to smoke, respectively. Pt discussed being unsuccessful with incorporating nicotine patches in the past and pt was receptive to incorporating nicotine gum into her tx plan. Counselor made plans to follow up with pt. Counselor and pt discussed behavioral strategies pt could adopt to help mitigate her urges and reduce her tobacco use (listed below).     Tobacco Use Treatment  Program: [facility]  Type of Visit: Initial  Session Number: 1  Tobacco Use Treatment Visit: Talked with patient  Permission To Engage In Conversation Re: PHI w/ Visitors Present: n/a  Goals Of Session: Assessment, Communication of feelings, Insight, increase, Problem-solving skill development, Risk behaviors, modulate, Behavior management, improve, Anxiety, decrease, Self-Confidence, increase    Cancer Center Patients  Primary Service: Multi-D  Primary Cancer Type: Gastrointestinal (GI) (Hepatocellular carcinoma)    Tobacco Use During Past 30 Days  Time Since Last Tobacco Use: smoked a cigarette today (at least one puff)  Tobacco Withdrawal (Past 24 Hours): Anxiety  Type of Tobacco Products Used: Cigarettes  Quantity Used: 20  Quantity Per: day  Time to First Use After Waking: 31-60 minutes  Other Household Members Use Tobacco: Yes (Daughter smokes)  Smoking Allowed in Home: Yes    Tobacco Use History  Longest Time Without Tobacco: Months  # of Hours, Days, Weeks, Months or Years: 92  Most Recent Attempt: Currently working on  Medications Used in Past Attempts: Nicotine Patch  Side Effects: 1. Nicotine patch - ineffective    Behavioral Assessment  Why Uses: 1. Habit 2. Stress and anxiety management  Reasons to Become Tobacco Free: 1. Health 2. Liver transplant 3. Family  Barriers/Challenges: 1. Long-standing habit 2. Stress and anxiety coping 3. Dauhgter also smokes at home 4. Chronic hand-to-mouth behaviors  Strategies: 1. 4mg  nicotine gum 2. Hand-to-mouth replacements like plastic straw and faux cigarettes 3. Mindfulness skills and distracting from anxiety-triggered smoking behaviors 4. Friend is a support    Treatment Plan  Cessation Meds Currently Using: None  Outpatient/Discharge Medications Recommended: Gum 4mg   Plan to Obtain Outpatient Meds: TTS messaged providers for Rx  Patient's Plan  Post Discharge/Visit: Plan to quit as soon as possible  Follow-up Plan: Permission given, Phone follow-up scheduled    TTS Information  Diagnosis: Tobacco use disorder, unspecified, uncomplicated (F17.200)  Interventions: Assessed, Discussed, Behavioral techniques, Suggested, Taught, Tx plan development, Psycho-education, Encouraged, Supportive therapy  TTS Visit Length: Psychotherapy >16 min    Kelcey Wickstrom Riki Rusk) [place], [place], LCAS-A  Tobacco Treatment Counselor  [facility] Treatment [facility]  [phone]

## 2023-04-05 NOTE — Unmapped (Signed)
EGD  Procedure #1     Procedure #2   [id number]  MRN   MOON  Endoscopist     Is the patient's health insurance [employer], [employer], [facility]), [employer] [place], [employer], or [place]?     Urgent procedure     Are you pregnant?     Are you in the process of scheduling or awaiting results of a heart ultrasound, stress test, or catheterization to evaluate new or worsening chest pain, dizziness, or shortness of breath?     Do you take: Plavix (clopidogrel), Coumadin (warfarin), Lovenox (enoxaparin), Pradaxa (dabigatran), Effient (prasugrel), Xarelto (rivaroxaban), Eliquis (apixaban), Pletal (cilostazol), or Brilinta (ticagrelor)?          Did ordering provider indicate how long to hold this medication in the order comments?          Which of the above medications are you taking?          What is the name of the medical practice that manages this medication?          What is the name of the medical provider who manages this medication?     Do you have hemophilia, von Willebrand disease, or low platelets?     Do you have a pacemaker or implanted cardiac defibrillator?     Has a [place] GI provider specified the location(s)?     Which location(s) did the [facility] provider specify?          [facility]          [place]          [facility]   TRUE  Do you see a liver specialist for chronic liver disease?     Is the procedure indication for variceal screening?     Is procedure indication for variceal banding (this does NOT include variceal screening)?     Have you had a heart attack, stroke or heart stent placement within the past 6 months?     Month of event     Year of event (ONLY ENTER LAST 2 DIGITS)        5  Height (feet)   4  Height (inches)   119  Weight (pounds)   20.4  BMI          Did the ordering provider specify a bowel prep?          What bowel prep was specified?     Do you have an ostomy (bag on your stomach that collects your stool)?          Is it an ileostomy?          Is it a colostomy? Patient doesn't know.     Do you have chronic kidney disease?     Do you have chronic constipation or have you had poor quality bowel preps for past colonoscopies?     Do you have Crohn's disease or ulcerative colitis?     Have you had weight loss surgery?          When you walk around your house or grocery store, do you have to stop and rest due to shortness of breath, chest pain, or light-headedness?     Do you ever use supplemental oxygen?     Have you been hospitalized for cirrhosis of the liver or heart failure in the last 12 months?     Have you been treated for mouth or throat cancer with radiation or surgery?  Have you been told that it is difficult for doctors to insert a breathing tube in you during anesthesia?     Have you had a heart or lung transplant?          Are you on dialysis?   TRUE  Do you have cirrhosis of the liver?     Do you have myasthenia gravis?     Is the patient a prisoner?   ################# ## ###################################################################################################################   MRN:  [id number]   Anticoag Review  No   Nurse Triage  No   GI clinic consult  No   Procedure(s):  EGD     0   Endoscopist:  MOON   Urgent:  No   Prep:                    --------------------------- --- ----------------------------------------------------------------------------------------------------------------------------------------------------------------------------   G3 Locations:  [facility]     [facility]             Requested Locations:              ################# ## ###################################################################################################################

## 2023-04-07 NOTE — Unmapped (Signed)
Received referral to verify pt's insurance.   Pt has active coverage with [facility] Plan with prescription benefits included.   Liver transplant case created with [facility] on 04/04/2023.   Received a VM from [employer] RN [phone] ext. [phone] with [facility]. Liver transplant eval has been approved under auth# [id number] valid from 04/04/2023 to 04/03/24. Notify in-take when patient is ready to list. Fax clinicals at [phone].     Pt is financially clear to proceed with liver transplant evaluation.

## 2023-04-08 ENCOUNTER — Other Ambulatory Visit: Payer: Self-pay | Admitting: Nurse Practitioner

## 2023-04-10 ENCOUNTER — Ambulatory Visit: Payer: Self-pay | Admitting: *Deleted

## 2023-04-10 NOTE — Telephone Encounter (Signed)
       Chief Complaint: Has white patches on tongue, roof of mouth, lower gums. Painful. Asking for pill treatment, "not a mouthwash." Symptoms: Above Frequency: 3 days ago Pertinent Negatives: Patient denies fever Disposition: [] ED /[] Urgent Care (no appt availability in office) / [] Appointment(In office/virtual)/ []  [place] [facility]/ [] Home Care/ [x] Refused Recommended Disposition /[] [place] [facility]/ [x]  Follow-up with PCP Additional Notes: Please advise pt.  Reason for Disposition  [1] White patches that stick to tongue or inner cheek AND [2] can be wiped off  Answer Assessment - Initial Assessment Questions 1. SYMPTOM: "What's the main symptom you're concerned about?" (e.g., chapped lips, dry mouth, lump, sores)     Roof of mouth, lower gums, tongue has white patches 2. ONSET: "When did the    start?"     3 days ago 3. PAIN: "Is there any pain?" If Yes, ask: "How bad is it?" (Scale: 1-10; mild, moderate, severe)   - MILD (1-3):  doesn't interfere with eating or normal activities   - MODERATE (4-7): interferes with eating some solids and normal activities   - SEVERE (8-10):  excruciating pain, interferes with most normal activities   - SEVERE DYSPHAGIA: can't swallow liquids, drooling     Severe 4. CAUSE: "What do you think is causing the symptoms?"     Thrush 5. OTHER SYMPTOMS: "Do you have any other symptoms?" (e.g., fever, sore throat, toothache, swelling)     No 6. PREGNANCY: "Is there any chance you are pregnant?" "When was your last menstrual period?"     No  Protocols used: Mouth Symptoms-A-AH

## 2023-04-10 NOTE — Telephone Encounter (Signed)
Attempted to contact patient- no answer- left message to call office.   Summary: Possible thrush   Pt has called requesting a medication for Thrush, says she knows what she has and is experiencing pain.  Best contact: [phone]

## 2023-04-10 NOTE — Telephone Encounter (Signed)
Patient did want to come in and be seen for this when she knows what it is she stated that she will do what she did the last time when she had it.

## 2023-04-10 NOTE — Telephone Encounter (Signed)
Requested medication (s) are due for refill today: yes  Requested medication (s) are on the active medication list: yes  Last refill:  01/11/23 #90/0  Future visit scheduled: no  Notes to clinic:  Unable to refill per protocol, cannot delegate.    Requested Prescriptions  Pending Prescriptions Disp Refills   methocarbamol (ROBAXIN) 500 MG tablet [Pharmacy Med Name: METHOCARBAMOL 500MG  TABLET] 60 tablet 0    Sig: TAKE ONE TABLET BY MOUTH FOUR TIMES A DAY     Not Delegated - Analgesics:  Muscle Relaxants Failed - 04/08/2023 11:17 AM      Failed - This refill cannot be delegated      Passed - Valid encounter within last 6 months    Recent Outpatient Visits           4 months ago Primary hypertension   [place] [facility] Larae Grooms, NP   8 months ago Annual physical exam   [place] [facility] Larae Grooms, NP   1 year ago COPD exacerbation [facility])   [place] [facility] [place], Corrie Dandy T, NP   1 year ago Chronic pain of right knee   [place] [facility] Larae Grooms, NP   1 year ago Moderate COPD (chronic obstructive pulmonary disease) [facility])   [facility] Larae Grooms, NP

## 2023-04-10 NOTE — Telephone Encounter (Signed)
Patient needs an appointment

## 2023-04-11 NOTE — Unmapped (Signed)
[facility] Specialty and Home Delivery Pharmacy Refill Coordination Note    Specialty Lite Medication(s) to be Shipped:   Xifaxan    Other medication(s) to be shipped: No additional medications requested for fill at this time     Robin Velez, DOB: [birth year]  Phone: There are no phone numbers on file.      All above HIPAA information was verified with patient.     Was a Nurse, learning disability used for this call? No    Changes to medications: Robin Velez reports no changes at this time.  Changes to insurance: No      REFERRAL TO PHARMACIST     Referral to the pharmacist: Not needed      [facility]     Shipping address confirmed in Epic.     Delivery Scheduled: Yes, Expected medication delivery date: 04/14/23.     Medication will be delivered via UPS to the prescription address in Epic WAM.    Kerby Less   [facility] Specialty and Home Delivery Pharmacy Specialty Technician

## 2023-04-13 MED FILL — XIFAXAN 550 MG TABLET: ORAL | 30 days supply | Qty: 60 | Fill #2

## 2023-04-17 ENCOUNTER — Ambulatory Visit (INDEPENDENT_AMBULATORY_CARE_PROVIDER_SITE_OTHER): Payer: Medicaid Other | Admitting: Nurse Practitioner

## 2023-04-17 ENCOUNTER — Encounter: Payer: Self-pay | Admitting: Nurse Practitioner

## 2023-04-17 VITALS — BP 159/75 | HR 83 | Temp 97.6°F | Wt 120.6 lb

## 2023-04-17 DIAGNOSIS — M25551 Pain in right hip: Secondary | ICD-10-CM

## 2023-04-17 DIAGNOSIS — F3341 Major depressive disorder, recurrent, in partial remission: Secondary | ICD-10-CM

## 2023-04-17 DIAGNOSIS — K7469 Other cirrhosis of liver: Secondary | ICD-10-CM

## 2023-04-17 DIAGNOSIS — C220 Liver cell carcinoma: Secondary | ICD-10-CM

## 2023-04-17 DIAGNOSIS — R7303 Prediabetes: Secondary | ICD-10-CM

## 2023-04-17 DIAGNOSIS — E782 Mixed hyperlipidemia: Secondary | ICD-10-CM

## 2023-04-17 DIAGNOSIS — F319 Bipolar disorder, unspecified: Secondary | ICD-10-CM

## 2023-04-17 DIAGNOSIS — I10 Essential (primary) hypertension: Secondary | ICD-10-CM | POA: Diagnosis not present

## 2023-04-17 DIAGNOSIS — J449 Chronic obstructive pulmonary disease, unspecified: Secondary | ICD-10-CM

## 2023-04-17 MED ORDER — RIFAXIMIN 550 MG PO TABS: 550.0000 mg | ORAL_TABLET | Freq: Two times a day (BID) | ORAL | Status: AC

## 2023-04-17 MED ORDER — OXCARBAZEPINE 150 MG PO TABS: 150.0000 mg | ORAL_TABLET | Freq: Two times a day (BID) | ORAL | 1 refills | Status: AC

## 2023-04-17 MED ORDER — BUSPIRONE HCL 10 MG PO TABS
10.0000 mg | ORAL_TABLET | Freq: Two times a day (BID) | ORAL | Status: DC
Start: 2023-04-17 — End: 2023-06-06

## 2023-04-17 NOTE — Progress Notes (Unsigned)
BP (!) 159/75   Pulse 83   Temp 97.6 F (36.4 C) (Oral)   Wt 120 lb 9.6 oz (54.7 kg)   LMP  (LMP Unknown)   SpO2 91%   BMI 20.70 kg/m    Subjective:    Patient ID: Monique Tucker, female    DOB: [birth year], 62 y.o.   MRN: [id number]  HPI: Monique Tucker is a 62 y.o. female  Chief Complaint  Patient presents with   Hip Pain   Hand Pain    HYPERTENSION / HYPERLIPIDEMIA Satisfied with current treatment? yes Duration of hypertension: years BP monitoring frequency: not checking BP range:  BP medication side effects: no Past BP meds: Propranolol, amlodipine, and Losartan Duration of hyperlipidemia: years Cholesterol medication side effects: no Cholesterol supplements: none Past cholesterol medications: none Medication compliance: good compliance Aspirin: no Recent stressors: no Recurrent headaches: no Visual changes: no Palpitations: no Dyspnea: no Chest pain: no Lower extremity edema: no Dizzy/lightheaded: no  MOOD Followed by Psychiatry.  She has been out of her Seroquel recently.  She continues to follow up with them.  She sees them monthly on the phone.  COPD Doing well with Spiriva and Advair. COPD status: controlled Satisfied with current treatment?: no Oxygen use: no Dyspnea frequency:  Cough frequency: when she smokes and doesn't use her inhaler Rescue inhaler frequency:   Limitation of activity: no Productive cough:  Last Spirometry:  Pneumovax: Up to Date Influenza: Up to Date She is still cutting back on her smoking.     HIP PAIN Duration:  years Involved hip: right  Mechanism of injury: unknown Location: lateral Onset: gradual  Severity: 10/10  Quality: burning and throbbing Frequency: intermittent Radiation: no Aggravating factors: weight bearing and movement   Alleviating factors:  laying   Status: worse Treatments attempted: ice, heat, and ibuprofen   Relief with NSAIDs?: mild Weakness with weight bearing: no Weakness with  walking: no Paresthesias / decreased sensation: no Swelling: no Redness:no Fevers: no   Relevant past medical, surgical, family and social history reviewed and updated as indicated. Interim medical history since our last visit reviewed. Allergies and medications reviewed and updated.  Review of Systems  Eyes:  Negative for visual disturbance.  Respiratory:  Negative for cough, chest tightness and shortness of breath.   Cardiovascular:  Negative for chest pain, palpitations and leg swelling.  Neurological:  Negative for dizziness and headaches.  Psychiatric/Behavioral:  Positive for dysphoric mood. Negative for suicidal ideas. The patient is nervous/anxious.     Per HPI unless specifically indicated above     Objective:    BP (!) 159/75   Pulse 83   Temp 97.6 F (36.4 C) (Oral)   Wt 120 lb 9.6 oz (54.7 kg)   LMP  (LMP Unknown)   SpO2 91%   BMI 20.70 kg/m   Wt Readings from Last 3 Encounters:  04/17/23 120 lb 9.6 oz (54.7 kg)  12/06/22 114 lb 9.6 oz (52 kg)  07/18/22 108 lb (49 kg)    Physical Exam Vitals and nursing note reviewed.  Constitutional:      General: She is not in acute distress.    Appearance: Normal appearance. She is normal weight. She is not ill-appearing, toxic-appearing or diaphoretic.  HENT:     Head: Normocephalic.     Right Ear: External ear normal.     Left Ear: External ear normal.     Nose: Nose normal.     Mouth/Throat:     Mouth:  Mucous membranes are moist.     Pharynx: Oropharynx is clear.  Eyes:     General:        Right eye: No discharge.        Left eye: No discharge.     Extraocular Movements: Extraocular movements intact.     Conjunctiva/sclera: Conjunctivae normal.     Pupils: Pupils are equal, round, and reactive to light.  Cardiovascular:     Rate and Rhythm: Normal rate and regular rhythm.     Heart sounds: No murmur heard. Pulmonary:     Effort: Pulmonary effort is normal. No respiratory distress.     Breath sounds:  Normal breath sounds. No wheezing or rales.  Musculoskeletal:     Cervical back: Normal range of motion and neck supple.  Skin:    General: Skin is warm and dry.     Capillary Refill: Capillary refill takes less than 2 seconds.  Neurological:     General: No focal deficit present.     Mental Status: She is alert and oriented to person, place, and time. Mental status is at baseline.  Psychiatric:        Mood and Affect: Mood normal.        Behavior: Behavior normal.        Thought Content: Thought content normal.        Judgment: Judgment normal.     Results for orders placed or performed in visit on 12/06/22  Comp Met (CMET)  Result Value Ref Range   Glucose 143 (H) 70 - 99 mg/dL   BUN 10 8 - 27 mg/dL   Creatinine, Ser 4.03 0.57 - 1.00 mg/dL   eGFR 69 >47 QQ/VZD/6.38   BUN/Creatinine Ratio 11 (L) 12 - 28   Sodium 140 134 - 144 mmol/L   Potassium 3.7 3.5 - 5.2 mmol/L   Chloride 102 96 - 106 mmol/L   CO2 21 20 - 29 mmol/L   Calcium 9.3 8.7 - 10.3 mg/dL   Total Protein 7.1 6.0 - 8.5 g/dL   Albumin 4.5 3.9 - 4.9 g/dL   Globulin, Total 2.6 1.5 - 4.5 g/dL   Albumin/Globulin Ratio 1.7    Bilirubin Total 0.2 0.0 - 1.2 mg/dL   Alkaline Phosphatase 135 (H) 44 - 121 IU/L   AST 26 0 - 40 IU/L   ALT 19 0 - 32 IU/L  Lipid Profile  Result Value Ref Range   Cholesterol, Total 163 100 - 199 mg/dL   Triglycerides 756 0 - 149 mg/dL   HDL 59 >43 mg/dL   VLDL Cholesterol Cal 18 5 - 40 mg/dL   LDL Chol Calc (NIH) 86 0 - 99 mg/dL   Chol/HDL Ratio 2.8 0.0 - 4.4 ratio  HgB A1c  Result Value Ref Range   Hgb A1c MFr Bld 5.4 4.8 - 5.6 %   Est. average glucose Bld gHb Est-mCnc 108 mg/dL  Hepatitis C Antibody  Result Value Ref Range   Hep C Virus Ab Reactive (A) Non Reactive      Assessment & Plan:   Problem List Items Addressed This Visit       Cardiovascular and Mediastinum   Hypertension - Primary   Relevant Medications   propranolol (INDERAL) 10 MG tablet     Respiratory    Moderate COPD (chronic obstructive pulmonary disease) (HCC)     Digestive   Other cirrhosis of liver (HCC)   Hepatocellular carcinoma (HCC)     Other   Bipolar 1 disorder (  HCC)   Depression   Acute pain of right hip   Prediabetes   Other Visit Diagnoses     Mixed hyperlipidemia       Relevant Medications   propranolol (INDERAL) 10 MG tablet         Follow up plan: No follow-ups on file.

## 2023-04-17 NOTE — Patient Instructions (Addendum)
Albuterol as needed  Spiriva daily Adviar is twice daily

## 2023-04-18 ENCOUNTER — Encounter: Payer: Self-pay | Admitting: Nurse Practitioner

## 2023-04-18 LAB — COMPREHENSIVE METABOLIC PANEL
ALT: 22 [IU]/L (ref 0–32)
AST: 28 [IU]/L (ref 0–40)
Albumin: 4.5 g/dL (ref 3.9–4.9)
Alkaline Phosphatase: 126 [IU]/L — ABNORMAL HIGH (ref 44–121)
BUN/Creatinine Ratio: 15 (ref 12–28)
BUN: 13 mg/dL (ref 8–27)
Bilirubin Total: 0.2 mg/dL (ref 0.0–1.2)
CO2: 20 mmol/L (ref 20–29)
Calcium: 9.1 mg/dL (ref 8.7–10.3)
Chloride: 102 mmol/L (ref 96–106)
Creatinine, Ser: 0.85 mg/dL (ref 0.57–1.00)
Globulin, Total: 2.5 g/dL (ref 1.5–4.5)
Glucose: 184 mg/dL — ABNORMAL HIGH (ref 70–99)
Potassium: 4.1 mmol/L (ref 3.5–5.2)
Sodium: 138 mmol/L (ref 134–144)
Total Protein: 7 g/dL (ref 6.0–8.5)
eGFR: 77 mL/min/{1.73_m2} (ref >59)

## 2023-04-18 LAB — LIPID PANEL
Chol/HDL Ratio: 3.5 ratio (ref 0.0–4.4)
Cholesterol, Total: 196 mg/dL (ref 100–199)
HDL: 56 mg/dL (ref >39)
LDL Chol Calc (NIH): 126 mg/dL — ABNORMAL HIGH (ref 0–99)
Triglycerides: 77 mg/dL (ref 0–149)
VLDL Cholesterol Cal: 14 mg/dL (ref 5–40)

## 2023-04-18 LAB — HEMOGLOBIN A1C
Est. average glucose Bld gHb Est-mCnc: 114 mg/dL
Hgb A1c MFr Bld: 5.6 % (ref 4.8–5.6)

## 2023-04-18 NOTE — Assessment & Plan Note (Signed)
Chronic.  Ongoing.  Working on smoking cessation.  Plans to speak with insurance company regarding patches.  Continue with Spiriva and Advair.  Reviewed how and when to use each inhaler during visit today.  Continue with PRN albuterol.  Follow up in 6 months.  Call sooner if concerns arise.

## 2023-04-18 NOTE — Assessment & Plan Note (Signed)
Chronic.  Controlled.  Now followed by psychiatry.  Continue per their recommendations.  Labs ordered today.  Return to clinic in 6 months for reevaluation.  Call sooner if concerns arise.

## 2023-04-18 NOTE — Assessment & Plan Note (Signed)
Chronic. Controlled.  Continue with Amlodipine and Losartan.  She is not tolerating Propranolol well due to fatigue.  Encouraged patient to check blood pressures at home and take Propranolol BID.  Labs ordered today.  Follow up in 2 months.  Call sooner if concerns arise.

## 2023-04-18 NOTE — Assessment & Plan Note (Signed)
Chronic.  Controlled.  Continue with current medication regimen.  Working with specialist for transplant.  Labs ordered today.  Return to clinic in 6 months for reevaluation.  Call sooner if concerns arise.

## 2023-04-18 NOTE — Assessment & Plan Note (Signed)
Ongoing problem.  Referral placed for patient to see Orthopedics.

## 2023-04-18 NOTE — Assessment & Plan Note (Signed)
Chronic. Followed by Oncology.  Continue with collaboration.  Has completed treatment.  Encouraged patient to continue to follow up.

## 2023-04-18 NOTE — Assessment & Plan Note (Signed)
Labs ordered at visit today.  Will make recommendations based on lab results.   

## 2023-04-19 ENCOUNTER — Other Ambulatory Visit: Payer: Self-pay | Admitting: Nurse Practitioner

## 2023-04-19 NOTE — Unmapped (Signed)
Made third and final attempt to provide follow-up counseling to Robin Velez, 62 y.o. for treatment of tobacco use/dependence. Pt has been re-enrolled in automated call program to provide future opportunities for engagement with Tobacco Treatment Program.    Tobacco Use Treatment  Program: [facility]  Type of Visit: Attempt  Tobacco Use Treatment Visit: Patient not available  Permission To Engage In Conversation Re: PHI w/ Visitors Present: n/a    Cancer Center Patients  Primary Service: Multi-D  Primary Cancer Type: Gastrointestinal (GI) (Hepatocellular carcinoma)    Tobacco Use During Past 30 Days  Type of Tobacco Products Used: Cigarettes  Other Household Members Use Tobacco: Yes (Daughter smokes)  Smoking Allowed in Home: Yes    Tobacco Use History  Longest Time Without Tobacco: Months  # of Hours, Days, Weeks, Months or Years: 9  Medications Used in Past Attempts: Nicotine Patch  Side Effects: 1. Nicotine patch - ineffective    TTS Information  Diagnosis: Tobacco use disorder, unspecified, uncomplicated (F17.200)  TTS Visit Length: Less than 3 minutes    Robin Velez, Robin Velez, Robin Velez  Tobacco Treatment Counselor  [facility]  [phone]

## 2023-04-21 NOTE — Telephone Encounter (Signed)
Requested medications are due for refill today.  yes  Requested medications are on the active medications list.  yes  Last refill. 01/11/2023 #60 0 rf  Future visit scheduled.   no  Notes to clinic.  Refill/refusal not delegated.    Requested Prescriptions  Pending Prescriptions Disp Refills   methocarbamol (ROBAXIN) 500 MG tablet [Pharmacy Med Name: METHOCARBAMOL 500MG  TABLET] 60 tablet 0    Sig: TAKE ONE TABLET BY MOUTH FOUR TIMES A DAY     Not Delegated - Analgesics:  Muscle Relaxants Failed - 04/19/2023 12:28 PM      Failed - This refill cannot be delegated      Passed - Valid encounter within last 6 months    Recent Outpatient Visits           4 days ago Primary hypertension   [place] [facility] Larae Grooms, NP   4 months ago Primary hypertension   [place] [facility] Larae G[facility], NP   9 months ago Annual physical exam   [place] [facility] Larae Grooms, NP   1 year ago COPD exacerbation [facility])   [place] [facility] [place], Corrie Dandy T, NP   1 year ago Chronic pain of right knee   [place] [facility] Larae Grooms, NP

## 2023-04-24 NOTE — Unmapped (Signed)
Called patient to ask her to sign into Mychart and review consent. Did not reach her, left VM with instructions and my contact information.

## 2023-05-03 ENCOUNTER — Telehealth: Payer: Self-pay | Admitting: Nurse Practitioner

## 2023-05-03 NOTE — Telephone Encounter (Signed)
Copied from CRM [id number]. Topic: General - Other >> May 03, 2023 10:14 AM Everette C wrote: Reason for CRM: Medication Refill - Most Recent Primary Care Visit:  Provider: Larae Grooms Department: [facility] Visit Type: OFFICE VISIT Date: 04/17/2023  Medication: methocarbamol (ROBAXIN) 500 MG tablet [[id number]]    Has the patient contacted their pharmacy? Yes (Agent: If no, request that the patient contact the pharmacy for the refill. If patient does not wish to contact the pharmacy document the reason why and proceed with request.) (Agent: If yes, when and what did the pharmacy advise?)  Has the prescription been filled recently? No  Is the patient out of the medication? Yes  Has the patient been seen for an appointment in the last year OR does the patient have an upcoming appointment? Yes  Can we respond through MyChart? No  Agent: Please be advised that Rx refills may take up to 3 business days. We ask that you follow-up with your pharmacy.

## 2023-05-03 NOTE — Telephone Encounter (Signed)
Brayton Caves, can we check on this referral for the patient please?

## 2023-05-03 NOTE — Telephone Encounter (Signed)
Copied from CRM [id number]. Topic: Referral - Question >> May 03, 2023 10:21 AM Everette C wrote: Reason for CRM: The patient has called to follow up on a previous request for a referral for an orthopedic surgeon   The patient shares that they have not been contacted by a specialists office as previously discussed with their PCP   Please contact further when possible

## 2023-05-04 NOTE — Unmapped (Signed)
Attempted to reach patient to set up transplant appointments and open liver transplant evaluation.  Did not reach her.  Left VM with my contact information.

## 2023-05-07 IMAGING — MR MR ABDOMEN WO/W CM
18 series · 48 of 48 positions shown · IV contrast (6ml Gadavist)
Comparison: Right upper quadrant ultrasound, 12/24/2020

CLINICAL DATA: Characterize liver lesions incidentally identified
by ultrasound

EXAM:
MRI ABDOMEN WITHOUT AND WITH CONTRAST
TECHNIQUE: Multiplanar multisequence MR imaging of the abdomen was performed
both before and after the administration of intravenous contrast.
CONTRAST:  7.5mL GADAVIST GADOBUTROL 1 MMOL/ML IV SOLN

[Series 3: T2 · coronal · 6.0mm · 1.12mm/px · 2 of 30 slices shown (1 of 2)]
[im 1/30]
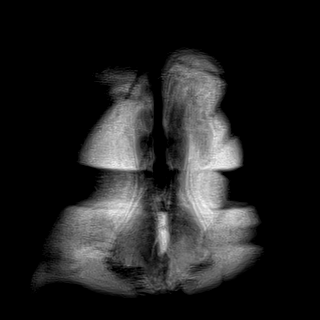
[im 30/30]
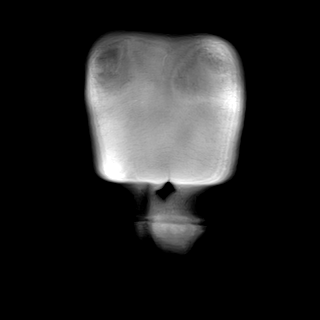

[Series 4: T2 · axial · 6.0mm · 1.12mm/px · z∈[-66,+143]mm · 2 of 30 slices shown (2 of 2)]
[im 1/30]
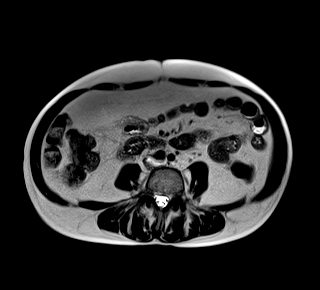
[im 30/30]
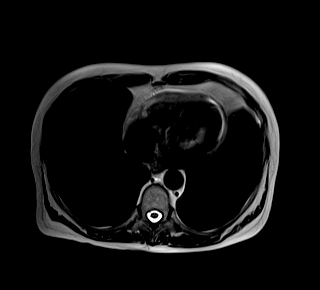

[Series 6: T2 fat-sat · axial · 6.0mm · 1.12mm/px · z∈[-42,+167]mm · 2 of 30 slices shown]
[im 1/30]
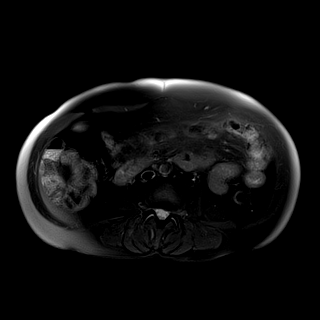
[im 30/30]
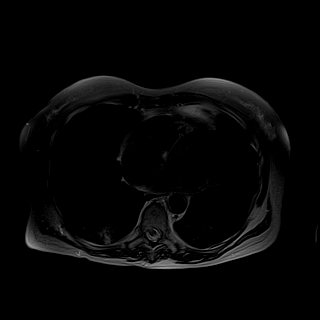

[Series 7: ax dwi_tracew · axial · 6.0mm · 1.34mm/px · z∈[-42,+167]mm · 4 of 90 slices shown]
[im 1/90]
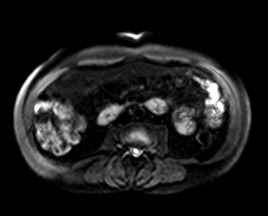
[im 30/90]
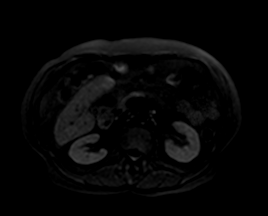
[im 60/90]
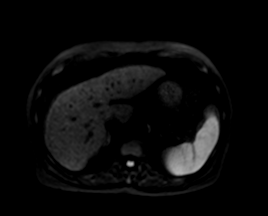
[im 90/90]
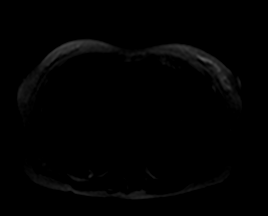

[Series 8: ax dwi_adc · axial · 6.0mm · 1.34mm/px · 1 of 30 slices shown]
[im 1/30]
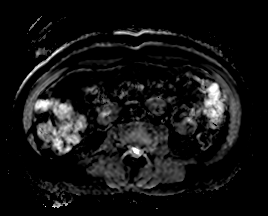

[Series 9: T1 · axial · 3.0mm · 1.12mm/px · z∈[-75,+138]mm · 3 of 72 slices shown (1 of 2)]
[im 1/72]
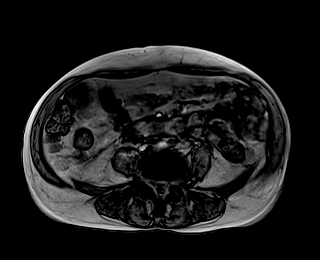
[im 36/72]
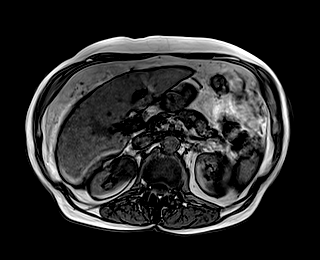
[im 72/72]
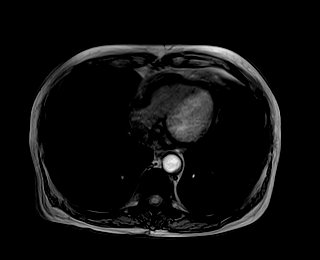

[Series 10: T1 · axial · 3.0mm · 1.12mm/px · z∈[-75,+138]mm · 3 of 72 slices shown (2 of 2)]
[im 1/72]
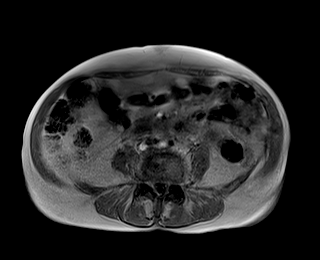
[im 36/72]
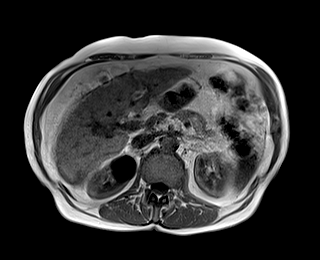
[im 72/72]
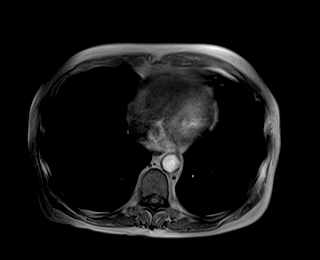

[Series 11: bSSFP · axial · 6.0mm · 0.70mm/px · 1 of 30 slices shown]
[im 1/30]
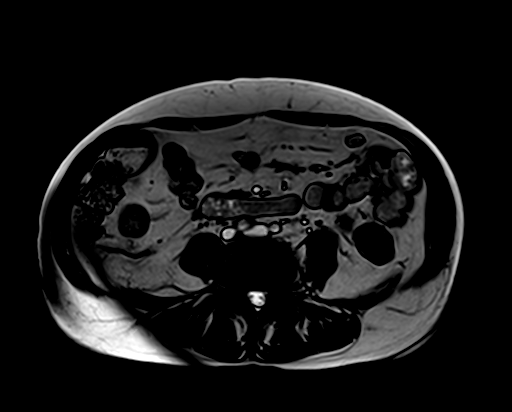

[Series 12: T1 dynamic fat-sat · axial · non-contrast · 3.0mm · 1.12mm/px · z∈[-96,+141]mm · 3 of 80 slices shown (1 of 5)]
[im 1/80]
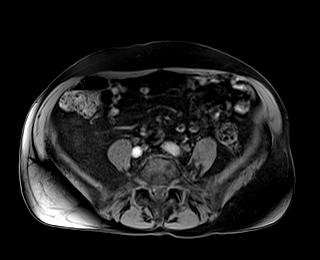
[im 40/80]
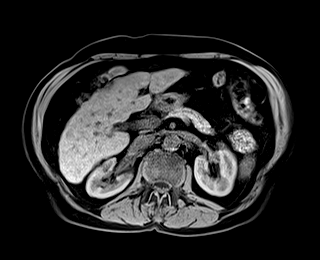
[im 80/80]
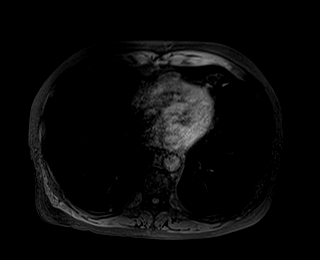

[Series 13: T1 dynamic fat-sat post-contrast · axial · 3.0mm · 1.12mm/px · z∈[-96,+141]mm · 3 of 80 slices shown (1 of 4)]
[im 1/80]
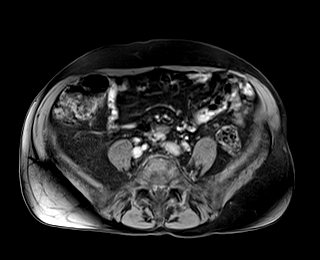
[im 40/80]
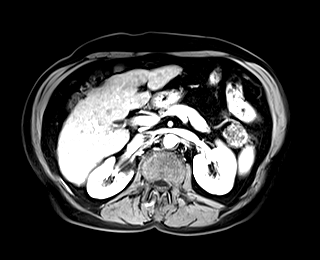
[im 80/80]
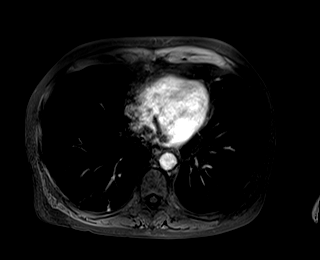

[Series 14: T1 dynamic fat-sat · axial · 3.0mm · 1.12mm/px · z∈[-96,+141]mm · 3 of 80 slices shown (2 of 5)]
[im 1/80]
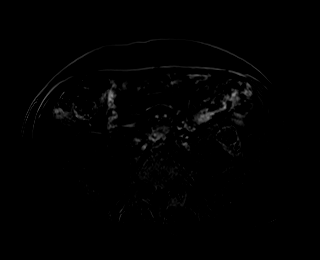
[im 40/80]
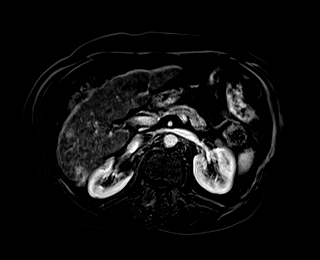
[im 80/80]
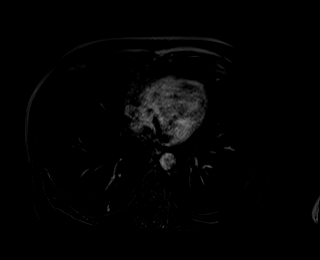

[Series 15: T1 dynamic fat-sat post-contrast · axial · 3.0mm · 1.12mm/px · z∈[-96,+141]mm · 3 of 80 slices shown (2 of 4)]
[im 1/80]
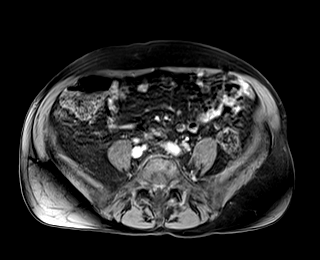
[im 40/80]
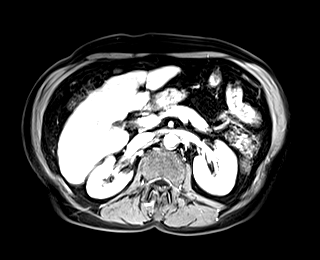
[im 80/80]
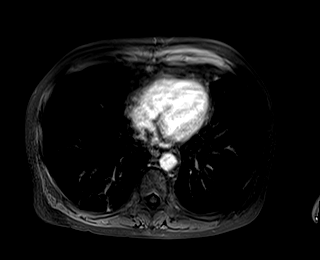

[Series 16: T1 dynamic fat-sat · axial · 3.0mm · 1.12mm/px · z∈[-96,+141]mm · 3 of 80 slices shown (3 of 5)]
[im 1/80]
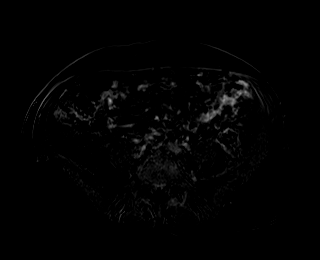
[im 40/80]
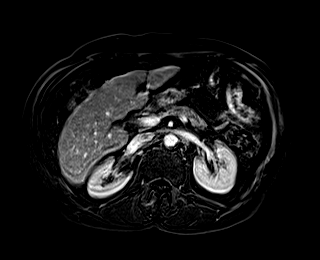
[im 80/80]
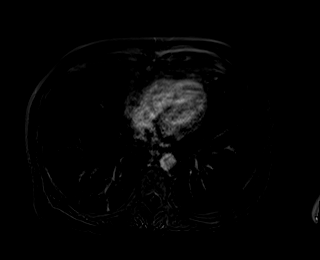

[Series 17: T1 dynamic fat-sat post-contrast · axial · 3.0mm · 1.12mm/px · z∈[-96,+141]mm · 3 of 80 slices shown (3 of 4)]
[im 1/80]
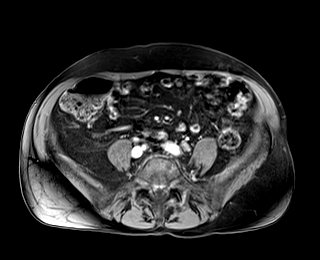
[im 40/80]
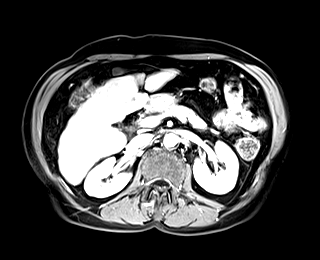
[im 80/80]
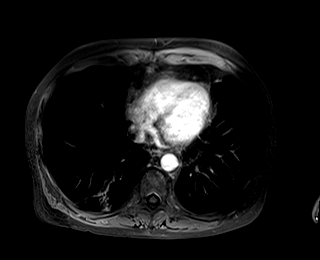

[Series 18: T1 dynamic fat-sat · axial · 3.0mm · 1.12mm/px · z∈[-96,+141]mm · 3 of 80 slices shown (4 of 5)]
[im 1/80]
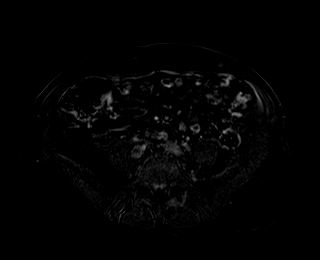
[im 40/80]
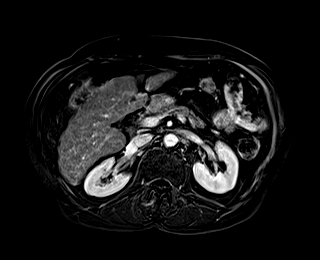
[im 80/80]
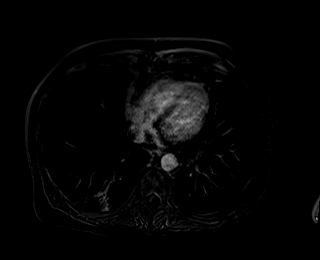

[Series 19: T1 dynamic post-contrast · coronal · 3.0mm · 1.12mm/px · 3 of 72 slices shown]
[im 1/72]
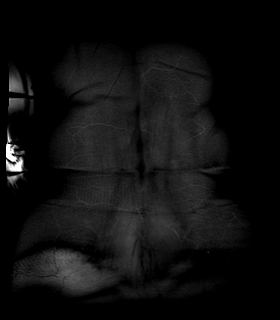
[im 36/72]
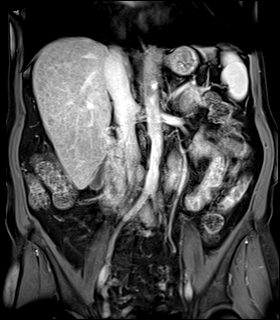
[im 72/72]
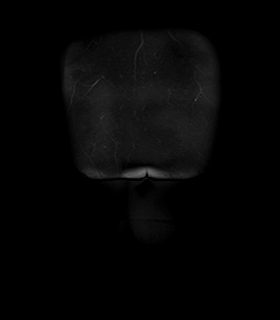

[Series 20: T1 dynamic fat-sat post-contrast · axial · 3.0mm · 1.12mm/px · z∈[-96,+141]mm · 3 of 80 slices shown (4 of 4)]
[im 1/80]
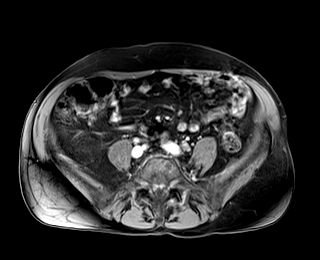
[im 40/80]
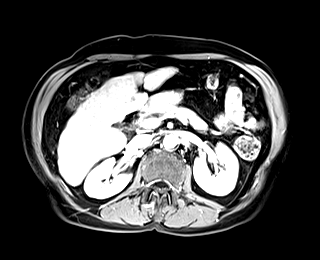
[im 80/80]
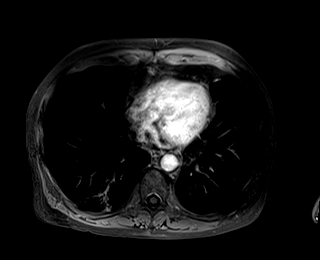

[Series 21: T1 dynamic fat-sat · axial · 3.0mm · 1.12mm/px · z∈[-96,+141]mm · 3 of 80 slices shown (5 of 5)]
[im 1/80]
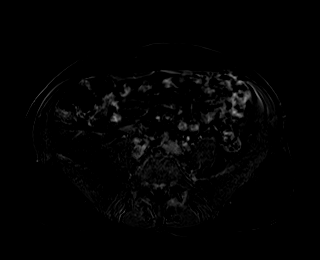
[im 40/80]
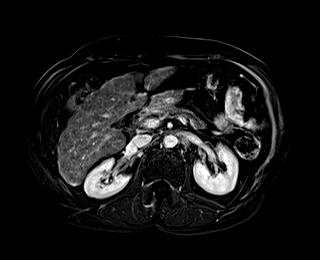
[im 80/80]
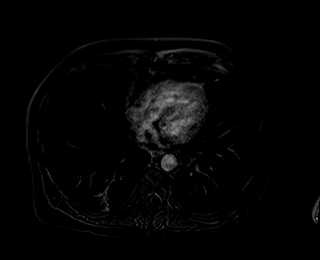

[48 of 48 positions shown; findings below may reference images not displayed]

FINDINGS: Lower chest: No acute findings.

Hepatobiliary: Coarse, nodular, cirrhotic morphology of the liver.
There are 3 arterially hyperenhancing lesions of the liver, with
faint underlying intrinsic T2 hyperintensity, in the posterior
inferior right lobe of the liver, hepatic segment VI, measuring
x 1.7 cm (series 13, image 40), in the anterior inferior right lobe
of the liver, hepatic segment V/VI, measuring 1.4 x 1.1 cm (series
13, image 42), and of the anterior inferior left lobe of the liver,
hepatic segment IV B, measuring 2.1 x 1.4 cm (series 13, image 46).
These lesions demonstrate subtle evidence of washout on multiphasic
contrast enhanced imaging, without clear evidence of capsular
enhancement (series 17, image 42, 40). Lesion in hepatic segment VI
may demonstrate an enhancing central scar (series 20, image 37). No
gallstones. No biliary ductal dilatation.

Pancreas: No mass, inflammatory changes, or other parenchymal
abnormality identified.

Spleen:  Within normal limits in size and appearance.

Adrenals/Urinary Tract: No masses identified. No evidence of
hydronephrosis.

Stomach/Bowel: Visualized portions within the abdomen are
unremarkable.

Vascular/Lymphatic: No pathologically enlarged lymph nodes
identified. No abdominal aortic aneurysm demonstrated. Aortic
atherosclerosis.

Other:  None.

Musculoskeletal: No suspicious bone lesions identified.
IMPRESSION: 1. There are three arterially hyperenhancing lesions of the liver,
with faint underlying T2 hyperintensity, in the posterior inferior
right lobe of the liver, hepatic segment VI, measuring 2.0 x 1.7 cm,
in the anterior inferior right lobe of the liver, hepatic segment
V/VI, measuring 1.4 x 1.1 cm, and of the anterior inferior left lobe
of the liver, hepatic segment IV B, measuring 2.1 x 1.4 cm. These
lesions demonstrate subtle evidence of washout on multiphasic
contrast enhanced imaging, without clear evidence of capsular
enhancement. Although imaging characteristics could suggest focal
nodular hyperplasia, in the setting of overt cirrhosis these are
highly suspicious for multifocal hepatocellular carcinoma. FAIN
category 5.
2. Cirrhotic morphology of the liver.

## 2023-05-22 ENCOUNTER — Other Ambulatory Visit: Payer: Self-pay | Admitting: Nurse Practitioner

## 2023-05-23 NOTE — Telephone Encounter (Signed)
Requested Prescriptions  Pending Prescriptions Disp Refills   amLODipine (NORVASC) 2.5 MG tablet [Pharmacy Med Name: AMLODIPINE BESYLATE 2.5MG  TABLET] 90 tablet 0    Sig: TAKE ONE TABLET BY MOUTH ONCE A DAY. TO TAKE IN CONJUNCTION WITH FIVE MG TABLET TO = A DOSE OF 7.5 MG     Cardiovascular: Calcium Channel Blockers 2 Failed - 05/22/2023  8:56 AM      Failed - Last BP in normal range    BP Readings from Last 1 Encounters:  04/17/23 (!) 159/75         Passed - Last Heart Rate in normal range    Pulse Readings from Last 1 Encounters:  04/17/23 83         Passed - Valid encounter within last 6 months    Recent Outpatient Visits           1 month ago Primary hypertension   [place] [facility] Larae Grooms, NP   5 months ago Primary hypertension   [place] [facility] Larae Grooms, NP   10 months ago Annual physical exam   [place] [facility] Larae Grooms, NP   1 year ago COPD exacerbation [facility])   [place] [facility] [place], Corrie Dandy T, NP   1 year ago Chronic pain of right knee   [place] [facility] Larae Grooms, NP       Future Appointments             In 2 weeks Larae Grooms, NP [place] [facility]

## 2023-05-30 ENCOUNTER — Other Ambulatory Visit: Payer: Self-pay | Admitting: Nurse Practitioner

## 2023-05-30 NOTE — Telephone Encounter (Signed)
Requested medication (s) are due for refill today - yes  Requested medication (s) are on the active medication list -yes  Future visit scheduled -yes  Last refill: 01/11/23 #60  Notes to clinic: non delegated Rx  Requested Prescriptions  Pending Prescriptions Disp Refills   methocarbamol (ROBAXIN) 500 MG tablet [Pharmacy Med Name: METHOCARBAMOL 500MG  TABLET] 60 tablet 0    Sig: TAKE ONE TABLET BY MOUTH FOUR TIMES A DAY     Not Delegated - Analgesics:  Muscle Relaxants Failed - 05/30/2023 12:01 PM      Failed - This refill cannot be delegated      Passed - Valid encounter within last 6 months    Recent Outpatient Visits           1 month ago Primary hypertension   [place] [facility] Larae Grooms, NP   5 months ago Primary hypertension   [place] [facility] Larae Grooms, NP   10 months ago Annual physical exam   [place] [facility] Larae Grooms, NP   1 year ago COPD exacerbation [facility])   [place] [facility] [place], Corrie Dandy T, NP   1 year ago Chronic pain of right knee   [place] [facility] Larae Grooms, NP       Future Appointments             In 1 week Larae Grooms, NP [place] [facility]               Requested Prescriptions  Pending Prescriptions Disp Refills   methocarbamol (ROBAXIN) 500 MG tablet [Pharmacy Med Name: METHOCARBAMOL 500MG  TABLET] 60 tablet 0    Sig: TAKE ONE TABLET BY MOUTH FOUR TIMES A DAY     Not Delegated - Analgesics:  Muscle Relaxants Failed - 05/30/2023 12:01 PM      Failed - This refill cannot be delegated      Passed - Valid encounter within last 6 months    Recent Outpatient Visits           1 month ago Primary hypertension   [place] [facility] Larae Grooms, NP   5 months ago Primary hypertension   [place] [facility] Larae Grooms, NP   10 months ago  Annual physical exam   [place] [facility] Larae Grooms, NP   1 year ago COPD exacerbation [facility])   [place] [facility] [place], Corrie Dandy T, NP   1 year ago Chronic pain of right knee    [facility] Larae Grooms, NP       Future Appointments             In 1 week Larae Grooms, NP  [facility]

## 2023-06-06 ENCOUNTER — Ambulatory Visit: Payer: Medicaid Other | Admitting: Nurse Practitioner

## 2023-06-06 ENCOUNTER — Encounter: Payer: Self-pay | Admitting: Nurse Practitioner

## 2023-06-06 VITALS — BP 131/74 | HR 74 | Temp 98.2°F | Ht 64.0 in | Wt 120.6 lb

## 2023-06-06 DIAGNOSIS — M79645 Pain in left finger(s): Secondary | ICD-10-CM | POA: Diagnosis not present

## 2023-06-06 DIAGNOSIS — G8929 Other chronic pain: Secondary | ICD-10-CM

## 2023-06-06 DIAGNOSIS — J449 Chronic obstructive pulmonary disease, unspecified: Secondary | ICD-10-CM

## 2023-06-06 DIAGNOSIS — F319 Bipolar disorder, unspecified: Secondary | ICD-10-CM

## 2023-06-06 MED ORDER — CETIRIZINE HCL 10 MG PO TABS
10.0000 mg | ORAL_TABLET | Freq: Every day | ORAL | 1 refills | Status: DC
Start: 2023-06-06 — End: 2023-11-06

## 2023-06-06 MED ORDER — BUSPIRONE HCL 10 MG PO TABS: 10.0000 mg | ORAL_TABLET | Freq: Two times a day (BID) | ORAL | 1 refills | Status: DC

## 2023-06-06 NOTE — Progress Notes (Unsigned)
BP 131/74 (BP Location: Right Arm, Patient Position: Sitting, Cuff Size: Normal)   Pulse 74   Temp 98.2 F (36.8 C) (Oral)   Ht 5\' 4"  (1.626 m)   Wt 120 lb 9.6 oz (54.7 kg)   LMP  (LMP Unknown)   SpO2 96%   BMI 20.70 kg/m    Subjective:    Patient ID: Monique Tucker, female    DOB: 22-Jan-1961, 62 y.o.   MRN: [id number]  HPI: Monique Tucker is a 62 y.o. female  Chief Complaint  Patient presents with   2 month follow up   Nasal Congestion    Would like medication for congestion due to COPD   Hand Pain    Left thumb, would like medication for the pain    Medication Refill    Buspirone HCL 10 mg     HYPERTENSION / HYPERLIPIDEMIA Satisfied with current treatment? yes Duration of hypertension: years BP monitoring frequency: not checking BP range:  BP medication side effects: no Past BP meds: Propranolol, amlodipine, and Losartan Duration of hyperlipidemia: years Cholesterol medication side effects: no Cholesterol supplements: none Past cholesterol medications: none Medication compliance: good compliance Aspirin: no Recent stressors: no Recurrent headaches: headaches Visual changes: no Palpitations: no Dyspnea: no Chest pain: no Lower extremity edema: no Dizzy/lightheaded: no  MOOD Followed by Psychiatry.  She has been out of her Seroquel recently.  She continues to follow up with them.  She sees them monthly on the phone.  COPD Doing well with Spiriva and Advair.  Wheezing when she lays down at night.  Using Singulair but not antihistamine.  COPD status: controlled Satisfied with current treatment?: no Oxygen use: no Dyspnea frequency:  Cough frequency: when she smokes and doesn't use her inhaler Rescue inhaler frequency:   Limitation of activity: no Productive cough:  Last Spirometry:  Pneumovax: Up to Date Influenza: Up to Date She is still cutting back on her smoking.    Patient states she hasn't made an appt to be seen for her left thumb.  She  has tried tylenol and ibuprofen which didn't help but BC powders do help with the pain.     Relevant past medical, surgical, family and social history reviewed and updated as indicated. Interim medical history since our last visit reviewed. Allergies and medications reviewed and updated.  Review of Systems  Eyes:  Negative for visual disturbance.  Respiratory:  Negative for cough, chest tightness and shortness of breath.   Cardiovascular:  Negative for chest pain, palpitations and leg swelling.  Musculoskeletal:        Left thumb pain  Neurological:  Negative for dizziness and headaches.  Psychiatric/Behavioral:  Positive for dysphoric mood. Negative for suicidal ideas. The patient is nervous/anxious.     Per HPI unless specifically indicated above     Objective:    BP 131/74 (BP Location: Right Arm, Patient Position: Sitting, Cuff Size: Normal)   Pulse 74   Temp 98.2 F (36.8 C) (Oral)   Ht 5\' 4"  (1.626 m)   Wt 120 lb 9.6 oz (54.7 kg)   LMP  (LMP Unknown)   SpO2 96%   BMI 20.70 kg/m   Wt Readings from Last 3 Encounters:  06/06/23 120 lb 9.6 oz (54.7 kg)  04/17/23 120 lb 9.6 oz (54.7 kg)  12/06/22 114 lb 9.6 oz (52 kg)    Physical Exam Vitals and nursing note reviewed.  Constitutional:      General: She is not in acute distress.  Appearance: Normal appearance. She is normal weight. She is not ill-appearing, toxic-appearing or diaphoretic.  HENT:     Head: Normocephalic.     Right Ear: External ear normal.     Left Ear: External ear normal.     Nose: Nose normal.     Mouth/Throat:     Mouth: Mucous membranes are moist.     Pharynx: Oropharynx is clear.  Eyes:     General:        Right eye: No discharge.        Left eye: No discharge.     Extraocular Movements: Extraocular movements intact.     Conjunctiva/sclera: Conjunctivae normal.     Pupils: Pupils are equal, round, and reactive to light.  Cardiovascular:     Rate and Rhythm: Normal rate and regular  rhythm.     Heart sounds: No murmur heard. Pulmonary:     Effort: Pulmonary effort is normal. No respiratory distress.     Breath sounds: Normal breath sounds. No wheezing or rales.  Musculoskeletal:     Cervical back: Normal range of motion and neck supple.  Skin:    General: Skin is warm and dry.     Capillary Refill: Capillary refill takes less than 2 seconds.  Neurological:     General: No focal deficit present.     Mental Status: She is alert and oriented to person, place, and time. Mental status is at baseline.  Psychiatric:        Mood and Affect: Mood normal.        Behavior: Behavior normal.        Thought Content: Thought content normal.        Judgment: Judgment normal.     Results for orders placed or performed in visit on 04/17/23  Comp Met (CMET)  Result Value Ref Range   Glucose 184 (H) 70 - 99 mg/dL   BUN 13 8 - 27 mg/dL   Creatinine, Ser 1.61 0.57 - 1.00 mg/dL   eGFR 77 >09 UE/AVW/0.98   BUN/Creatinine Ratio 15 12 - 28   Sodium 138 134 - 144 mmol/L   Potassium 4.1 3.5 - 5.2 mmol/L   Chloride 102 96 - 106 mmol/L   CO2 20 20 - 29 mmol/L   Calcium 9.1 8.7 - 10.3 mg/dL   Total Protein 7.0 6.0 - 8.5 g/dL   Albumin 4.5 3.9 - 4.9 g/dL   Globulin, Total 2.5 1.5 - 4.5 g/dL   Bilirubin Total 0.2 0.0 - 1.2 mg/dL   Alkaline Phosphatase 126 (H) 44 - 121 IU/L   AST 28 0 - 40 IU/L   ALT 22 0 - 32 IU/L  Lipid Profile  Result Value Ref Range   Cholesterol, Total 196 100 - 199 mg/dL   Triglycerides 77 0 - 149 mg/dL   HDL 56 >11 mg/dL   VLDL Cholesterol Cal 14 5 - 40 mg/dL   LDL Chol Calc (NIH) 914 (H) 0 - 99 mg/dL   Chol/HDL Ratio 3.5 0.0 - 4.4 ratio  HgB A1c  Result Value Ref Range   Hgb A1c MFr Bld 5.6 4.8 - 5.6 %   Est. average glucose Bld gHb Est-mCnc 114 mg/dL      Assessment & Plan:   Problem List Items Addressed This Visit       Respiratory   Moderate COPD (chronic obstructive pulmonary disease) (HCC)    Chronic.  Wheezes at night when she lays  down.  Continues with singulair.  Recommend  adding zyrtec to regimen. Follow up in 3 months.  Call sooner if concerns arise.       Relevant Medications   cetirizine (ZYRTEC ALLERGY) 10 MG tablet     Other   Bipolar 1 disorder (HCC)    Chronic.  Controlled.  Continue to follow up with psychiatry.  Refilled buspar at visit today.   Labs ordered today.  Return to clinic in 6 months for reevaluation.  Call sooner if concerns arise.       Other Visit Diagnoses     Chronic pain of left thumb    -  Primary   Xray ordered of thumb for further evaluation and management.  Will make recommendations based on results.   Relevant Orders   DG Hand Complete Left          Follow up plan: Return in about 3 months (around 09/04/2023) for HTN, HLD, DM2 FU.

## 2023-06-07 NOTE — Assessment & Plan Note (Signed)
Chronic.  Wheezes at night when she lays down.  Continues with singulair.  Recommend adding zyrtec to regimen. Follow up in 3 months.  Call sooner if concerns arise.

## 2023-06-07 NOTE — Assessment & Plan Note (Signed)
Chronic.  Controlled.  Continue to follow up with psychiatry.  Refilled buspar at visit today.   Labs ordered today.  Return to clinic in 6 months for reevaluation.  Call sooner if concerns arise.

## 2023-06-09 ENCOUNTER — Other Ambulatory Visit: Payer: Self-pay | Admitting: Nurse Practitioner

## 2023-06-09 NOTE — Telephone Encounter (Signed)
Requested medication (s) are due for refill today - provider reveiw  Requested medication (s) are on the active medication list -yes  Future visit scheduled -yes  Last refill: 05/30/23 #60  Notes to clinic: non delegated Rx  Requested Prescriptions  Pending Prescriptions Disp Refills   methocarbamol (ROBAXIN) 500 MG tablet [Pharmacy Med Name: METHOCARBAMOL 500MG  TABLET] 60 tablet 0    Sig: TAKE ONE TABLET BY MOUTH FOUR TIMES A DAY     Not Delegated - Analgesics:  Muscle Relaxants Failed - 06/09/2023  2:51 PM      Failed - This refill cannot be delegated      Passed - Valid encounter within last 6 months    Recent Outpatient Visits           3 days ago Chronic pain of left thumb   [place] [facility] Larae Grooms, NP   1 month ago Primary hypertension   [place] [facility] Larae Grooms, NP   6 months ago Primary hypertension   [place] [facility] Larae Grooms, NP   10 months ago Annual physical exam   [place] [facility] Larae Grooms, NP   1 year ago COPD exacerbation [facility])   [place] [facility] Marjie Skiff, NP       Future Appointments             In 2 months Larae Grooms, NP [place] [facility]               Requested Prescriptions  Pending Prescriptions Disp Refills   methocarbamol (ROBAXIN) 500 MG tablet [Pharmacy Med Name: METHOCARBAMOL 500MG  TABLET] 60 tablet 0    Sig: TAKE ONE TABLET BY MOUTH FOUR TIMES A DAY     Not Delegated - Analgesics:  Muscle Relaxants Failed - 06/09/2023  2:51 PM      Failed - This refill cannot be delegated      Passed - Valid encounter within last 6 months    Recent Outpatient Visits           3 days ago Chronic pain of left thumb   [place] [facility] Larae Grooms, NP   1 month ago Primary hypertension   [place] [facility] Larae Grooms, NP    6 months ago Primary hypertension   [place] [facility] Larae Grooms, NP   10 months ago Annual physical exam   [place] [facility] Larae Grooms, NP   1 year ago COPD exacerbation [facility])   [place] [facility] Marjie Skiff, NP       Future Appointments             In 2 months Larae Grooms, NP [facility], [facility]

## 2023-06-14 NOTE — Unmapped (Signed)
Robin Velez requested a refill of their Xifaxan via IVR/Web. [facility] has scheduled delivery per the patients request via UPS to be delivered to their prescription address on 06/15/23.

## 2023-06-19 MED FILL — XIFAXAN 550 MG TABLET: ORAL | 28 days supply | Qty: 56 | Fill #3

## 2023-06-23 ENCOUNTER — Other Ambulatory Visit: Payer: Self-pay | Admitting: Nurse Practitioner

## 2023-06-26 NOTE — Unmapped (Signed)
Late entry.     After multiple attempts to contact patient to discuss liver transplant evaluation, spoke with Dr. Waynetta Sandy on 12/19.  He will revisit liver transplant evaluation with her at next clinic visit to see if this is something she is still interested in.  Patient placed in deferred status and will be reviewed on 1/17.  Will plan to schedule or close case at that time.

## 2023-06-27 NOTE — Telephone Encounter (Signed)
 Requested Prescriptions  Pending Prescriptions Disp Refills   losartan  (COZAAR ) 100 MG tablet [Pharmacy Med Name: LOSARTAN  POTASSIUM 100MG  TABLET] 90 tablet 1    Sig: TAKE ONE TABLET (100 MG TOTAL) BY MOUTH DAILY.     Cardiovascular:  Angiotensin Receptor Blockers Passed - 06/27/2023  5:08 PM      Passed - Cr in normal range and within 180 days    Creatinine, Ser  Date Value Ref Range Status  04/17/2023 0.85 0.57 - 1.00 mg/dL Final         Passed - K in normal range and within 180 days    Potassium  Date Value Ref Range Status  04/17/2023 4.1 3.5 - 5.2 mmol/L Final         Passed - Patient is not pregnant      Passed - Last BP in normal range    BP Readings from Last 1 Encounters:  06/06/23 131/74         Passed - Valid encounter within last 6 months    Recent Outpatient Visits           3 weeks ago Chronic pain of left thumb   [place] [facility] Melvin Pao, NP   2 months ago Primary hypertension   [facility] [facility], NP   6 months ago Primary hypertension   [place] [facility] Melvin Pao, NP   11 months ago Annual physical exam   [place] [facility] Melvin Pao, NP   1 year ago COPD exacerbation [facility])   [place] [facility] Melanie DASEN, NP       Future Appointments             In 2 months Melvin Pao, NP [place] [facility]             amLODipine  (NORVASC ) 2.5 MG tablet [Pharmacy Med Name: AMLODIPINE  BESYLATE 2.5MG  TABLET] 90 tablet 0    Sig: TAKE ONE TABLET BY MOUTH ONCE A DAY. TO TAKE IN CONJUNCTION WITH FIVE MG TABLET TO = A DOSE OF 7.5 MG     Cardiovascular: Calcium Channel Blockers 2 Passed - 06/27/2023  5:08 PM      Passed - Last BP in normal range    BP Readings from Last 1 Encounters:  06/06/23 131/74         Passed - Last Heart Rate in normal range    Pulse Readings from Last 1  Encounters:  06/06/23 74         Passed - Valid encounter within last 6 months    Recent Outpatient Visits           3 weeks ago Chronic pain of left thumb   [place] [facility] Melvin Pao, NP   2 months ago Primary hypertension   [place] [facility] Melvin Pao, NP   6 months ago Primary hypertension   [place] [facility] Melvin Pao, NP   11 months ago Annual physical exam   [place] [facility] Melvin Pao, NP   1 year ago COPD exacerbation [facility])    [facility] Melanie DASEN, NP       Future Appointments             In 2 months Melvin Pao, NP  [facility]

## 2023-07-13 NOTE — Unmapped (Unsigned)
[facility]  07/14/2023    Reason for visit: {New/Return/Post:94740}    Assessment/Plan:    63 y.o. 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    -Overdue for EGD ***************     # HCV: s/p  Mavyret with SVR  - NTD     # HTN: ********  -Consider carvedilol 6.25 mg BID in future     #Tobacco Use: referred for tobacco cessation but patient could not be reached  - ******** smoking cessation counseling.    {hstime:94516}    Tawni Carnes, MD, MPH  Assistant Professor of Medicine  [facility]  [facility]  Division of Gastroenterology and Hepatology      Subjective   History of Present Illness   {Accompanied [phone]    63 y.o. 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 She is overdue for EGD.    Last visit, we referred for transplant but coordinator had significant difficulties getting in touch with the patient ****. The patient states.    =============================================================    FHSI-8 (Ver. 4)  -----------------------------------------------------------------------------------------------------------    I have a lack of energy      {FHSI-8 Score:115129}    I have nausea       {FHSI-8 Score:115129}    I have pain        {FHSI-8 Score:115129}    I am losing weight       {FHSI-8 Score:115129}    I have pain in my back      {FHSI-8 Score:115129}    I feel fatigued       {FHSI-8 Score:115129}    I am bothered by jaundice or yellow color to my skin  {FHSI-8 Score:115129}    I have discomfort or pain in my stomach area   {FHSI-8 Score:115129}  =============================================================    Other symptoms *********  ECOG performance status *************    Alcohol use ********  Tobacco use ********    Objective   Physical Exam   Vital Signs: There were no vitals taken for this visit.  Constitutional: She is in no apparent distress  Eyes: Anicteric sclerae  Cardiovascular: No peripheral edema  Gastrointestinal: Soft, nontender abdomen without hepatosplenomegaly, hernias, or masses  Neurologic: Awake, alert, and oriented to person, place, and time with normal speech and no asterixis    {Labs/Imaging/Pathology (Optional):104318}    {Optional documentation elements (Optional):94651}

## 2023-07-14 NOTE — Unmapped (Signed)
[facility] Other Call ONC Phone Room Smart Lists: Cancellation/Reschedule -     Hi,    Patient Robin Velez contacted the Communication Center to reschedule their appointments for today.  The original appointments have been cancelled.    Cancellation Reason: Transportation     Patient's appointment requires reschedule.    Thank you,  Yehuda Mao  [facility] Communication Center   [phone]    [facility]

## 2023-07-18 NOTE — Unmapped (Signed)
Left voicemail for patient to get rescheduled for cancelled appointments. Left communication center direct line to be rescheduled with Dr.Moon at his next opening.

## 2023-07-19 ENCOUNTER — Other Ambulatory Visit: Payer: Self-pay | Admitting: Nurse Practitioner

## 2023-07-19 IMAGING — CT CT CHEST W/O CM
1 series · 15 of 34 positions shown, 19 images · non-contrast
Comparison: MR abdomen, 03/20/2021

CLINICAL DATA: Multifocal hepatocellular carcinoma chest staging

EXAM:
CT CHEST WITHOUT CONTRAST
TECHNIQUE: Multidetector CT imaging of the chest was performed following the
standard protocol without IV contrast.

[Series 2: thorax · axial · 0.58mm/px · z∈[-648,-374]mm · 15 of 161 slices shown, 19 images]
[im 12/161  mediastinal]
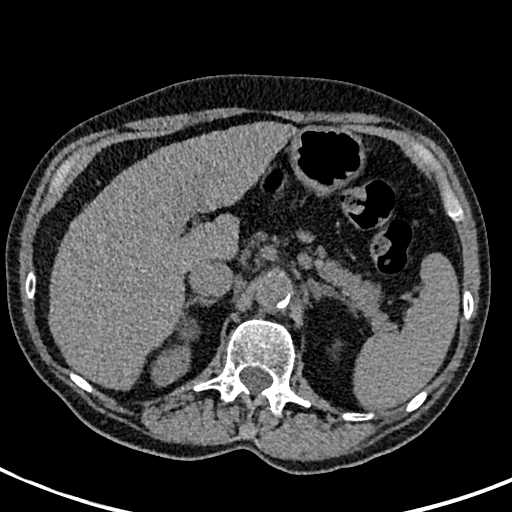
[im 12/161  lung]
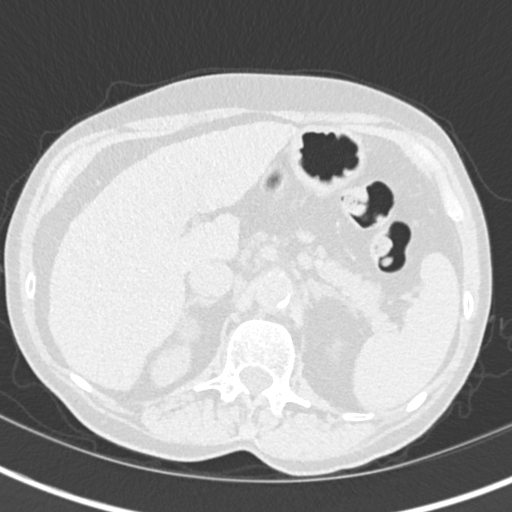
[im 24/161  lung]
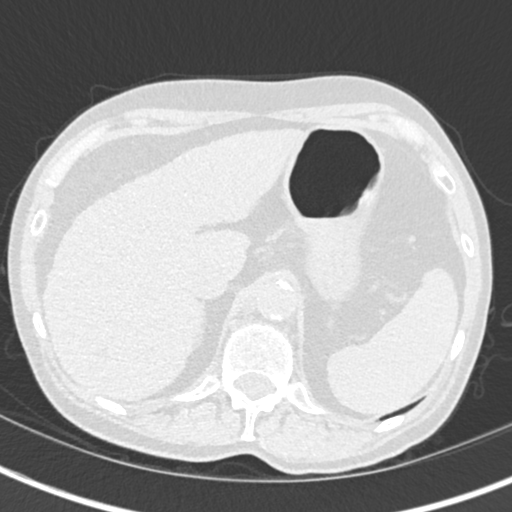
[im 33/161  lung]
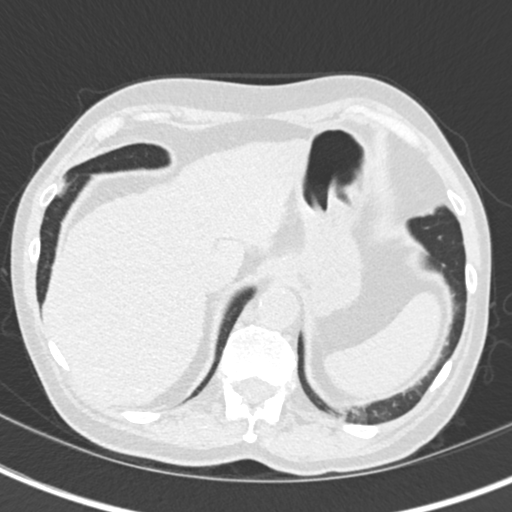
[im 42/161  lung]
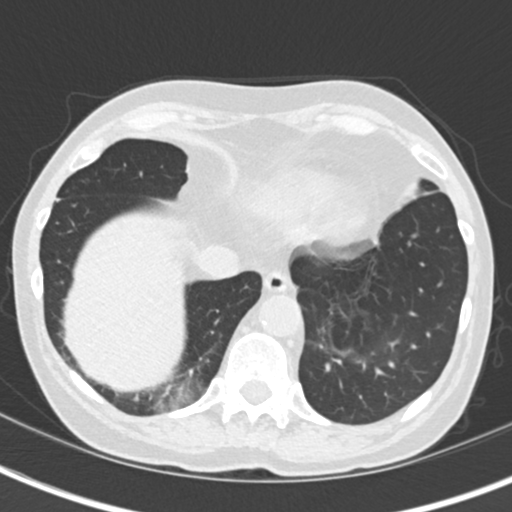
[im 54/161  mediastinal]
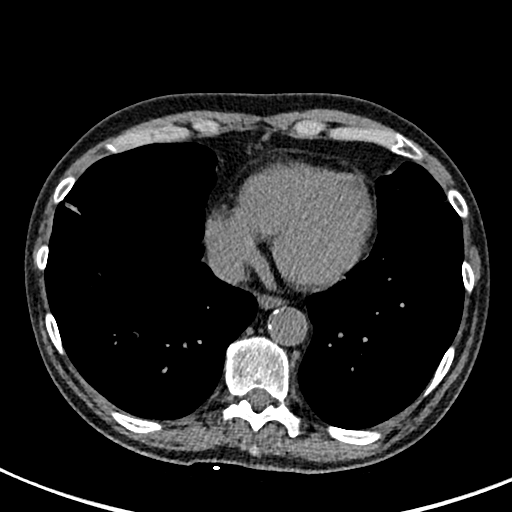
[im 54/161  lung]
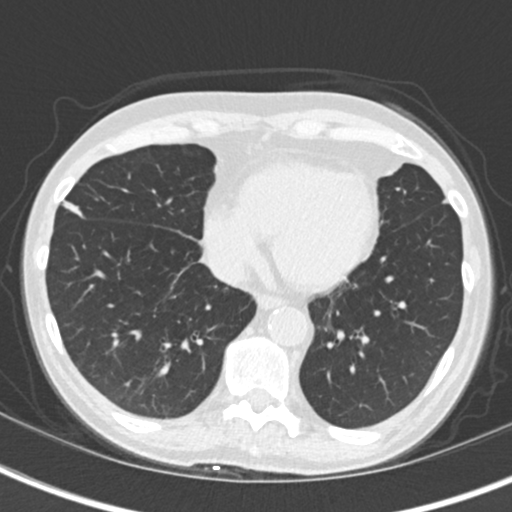
[im 65/161  lung]
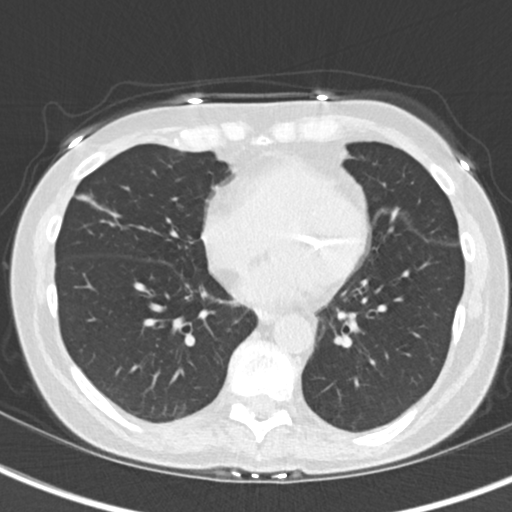
[im 72/161  lung]
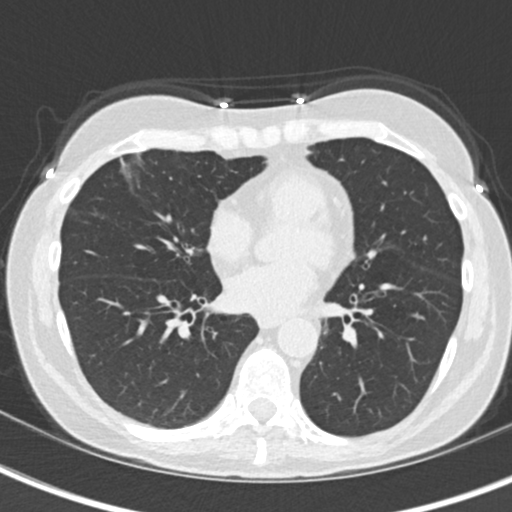
[im 83/161  lung]
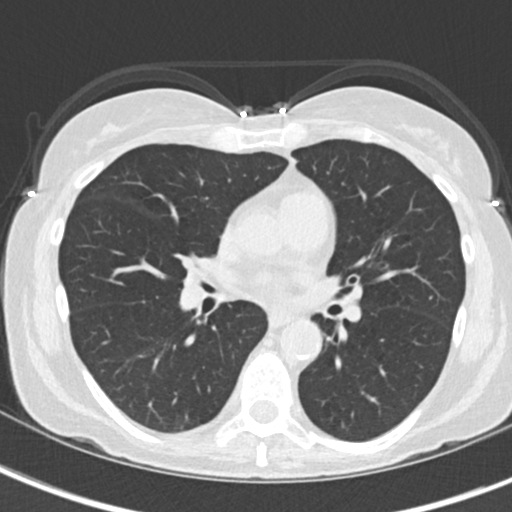
[im 89/161  mediastinal]
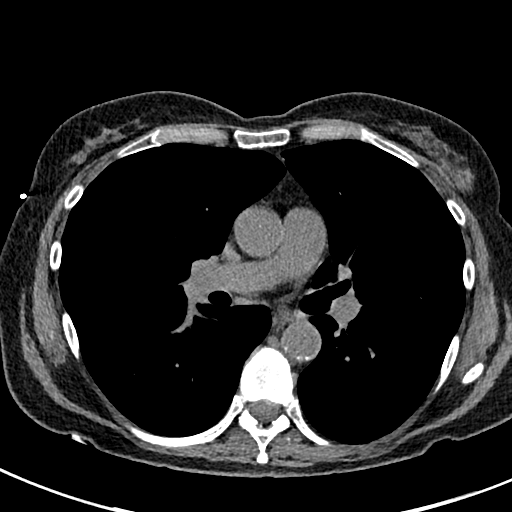
[im 89/161  lung]
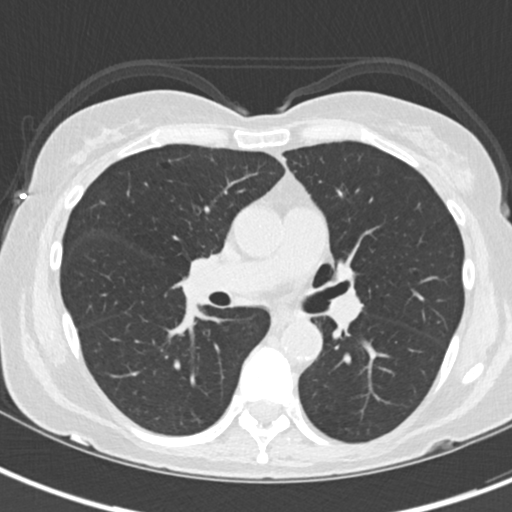
[im 97/161  lung]
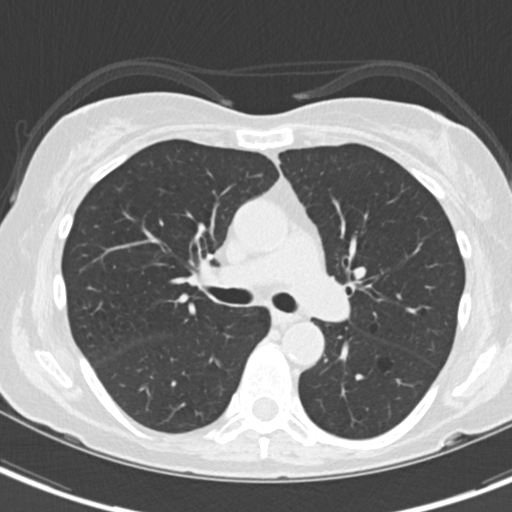
[im 107/161  lung]
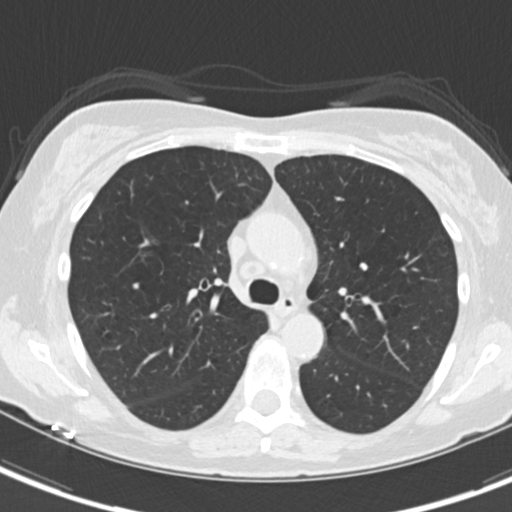
[im 119/161  lung]
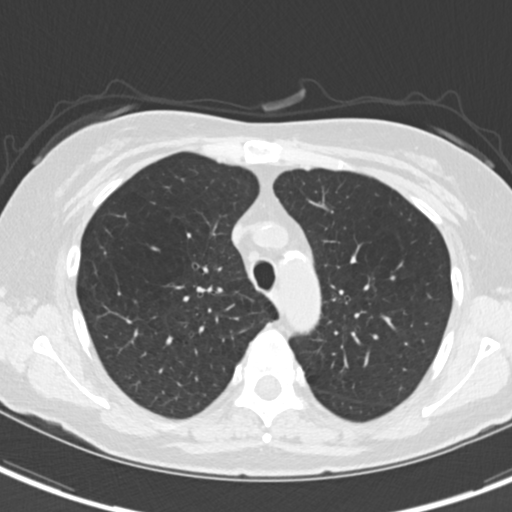
[im 129/161  mediastinal]
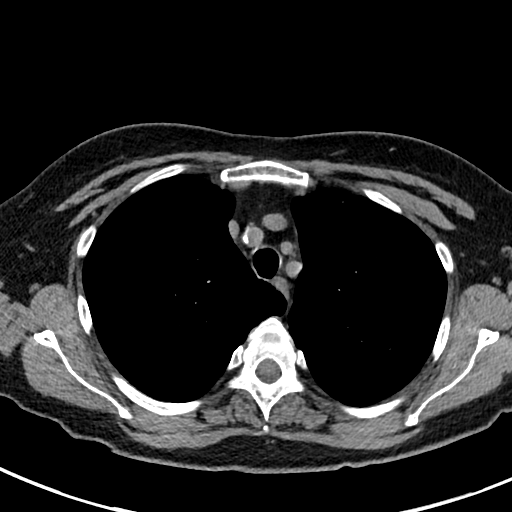
[im 129/161  lung]
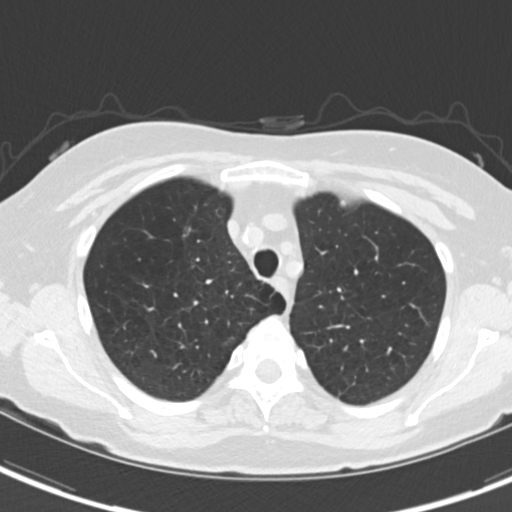
[im 137/161  lung]
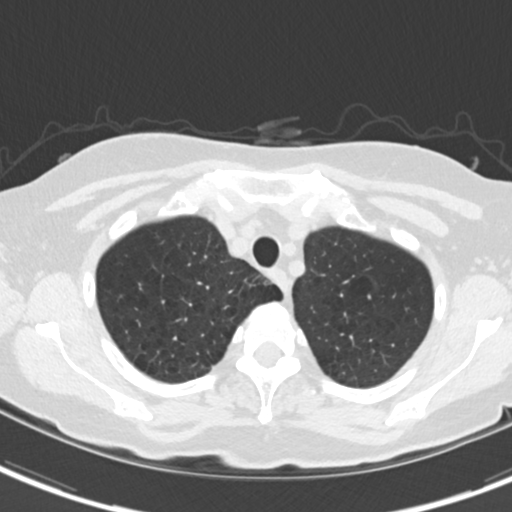
[im 149/161  lung]
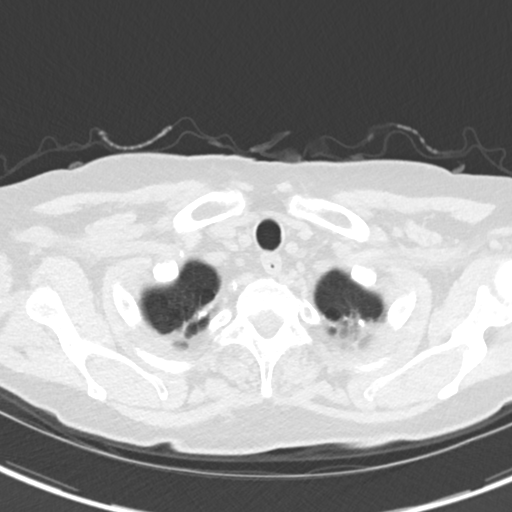

[15 of 34 positions shown; findings below may reference images not displayed]

FINDINGS: Cardiovascular: Aortic atherosclerosis. Normal heart size. No
pericardial effusion.

Mediastinum/Nodes: No enlarged mediastinal, hilar, or axillary lymph
nodes. Thyroid gland, trachea, and esophagus demonstrate no
significant findings.

Lungs/Pleura: Moderate to severe centrilobular emphysema. Diffuse
bilateral bronchial wall thickening. Bandlike scarring of the
bilateral lung bases. No pleural effusion or pneumothorax.

Upper Abdomen: No acute abnormality. Coarse, nodular cirrhotic
morphology of the liver. Liver lesions previously better assessed by
contrast enhanced MR of the abdomen.

Musculoskeletal: No chest wall mass or suspicious bone lesions
identified.
IMPRESSION: 1. No evidence of metastatic disease in the chest.
2. Emphysema and diffuse bilateral bronchial wall thickening.
3. Coarse, nodular cirrhotic morphology of the liver. Liver lesions
previously better assessed by contrast enhanced MR of the abdomen.

Aortic Atherosclerosis (PISDE-HZC.C) and Emphysema (PISDE-ATM.J).

## 2023-07-19 NOTE — Unmapped (Signed)
[facility] Scheduling Call     Hello,    We received a call regarding patient Robin Velez. Caller is requesting the following return appointments: imaging (CT, MRI, PET, Nuclear Med scan etc), labs, and return appointment.    We are unable to schedule this appointment because we were unable to get imaging on the line to coordinate the appointments.    Please reach out to the patient to provide further assistance.    Thank you,   Sharl Ma  [facility] Communication Center  [phone]

## 2023-07-19 NOTE — Telephone Encounter (Signed)
Requested medication (s) are due for refill today: yes  Requested medication (s) are on the active medication list: yes  Last refill:  05/30/23  Future visit scheduled: yes  Notes to clinic:  Unable to refill per protocol, cannot delegate.      Requested Prescriptions  Pending Prescriptions Disp Refills   methocarbamol (ROBAXIN) 500 MG tablet [Pharmacy Med Name: METHOCARBAMOL 500MG  TABLET] 60 tablet 0    Sig: TAKE ONE TABLET BY MOUTH FOUR TIMES A DAY     Not Delegated - Analgesics:  Muscle Relaxants Failed - 07/19/2023  1:09 PM      Failed - This refill cannot be delegated      Passed - Valid encounter within last 6 months    Recent Outpatient Visits           1 month ago Chronic pain of left thumb   [place] [facility] Larae Grooms, NP   3 months ago Primary hypertension   [place] [facility] Lar[facility] Grooms, NP   7 months ago Primary hypertension   [place] [facility] Larae Grooms, NP   1 year ago Annual physical exam   [place] [facility] Larae Grooms, NP   1 year ago COPD exacerbation [facility])   [place] [facility] Marjie Skiff, NP       Future Appointments             In 1 month Larae Grooms, NP [place] [facility]

## 2023-07-24 NOTE — Unmapped (Signed)
Called and spoke with patient to inform that the next available MRI would be end of Feb beginning of March. Patient asked for everything in one day and MRI to be around 9am. Will send a text to confirm all appointments.

## 2023-08-01 ENCOUNTER — Other Ambulatory Visit: Payer: Self-pay | Admitting: Nurse Practitioner

## 2023-08-01 NOTE — Telephone Encounter (Signed)
 Requested medication (s) are due for refill today -provider review   Requested medication (s) are on the active medication list -yes  Future visit scheduled -yes  Last refill: 07/19/23 #60  Notes to clinic: non delegated Rx  Requested Prescriptions  Pending Prescriptions Disp Refills   methocarbamol  (ROBAXIN ) 500 MG tablet [Pharmacy Med Name: METHOCARBAMOL  500MG  TABLET] 60 tablet 0    Sig: TAKE ONE TABLET BY MOUTH FOUR TIMES A DAY     Not Delegated - Analgesics:  Muscle Relaxants Failed - 08/01/2023  8:34 AM      Failed - This refill cannot be delegated      Passed - Valid encounter within last 6 months    Recent Outpatient Visits           1 month ago Chronic pain of left thumb   [facility] Melvin Pao, NP   3 months ago Primary hypertension   [place] [facility] Melvin Pao, NP   7 months ago Primary hypertension   [place] [facility] Melvin Pao, NP   1 year ago Annual physical exam   [place] [facility] Melvin Pao, NP   1 year ago COPD exacerbation [facility])   [place] [facility], Melanie DASEN, NP       Future Appointments             In 1 month Melvin Pao, NP [place] [facility]               Requested Prescriptions  Pending Prescriptions Disp Refills   methocarbamol  (ROBAXIN ) 500 MG tablet [Pharmacy Med Name: METHOCARBAMOL  500MG  TABLET] 60 tablet 0    Sig: TAKE ONE TABLET BY MOUTH FOUR TIMES A DAY     Not Delegated - Analgesics:  Muscle Relaxants Failed - 08/01/2023  8:34 AM      Failed - This refill cannot be delegated      Passed - Valid encounter within last 6 months    Recent Outpatient Visits           1 month ago Chronic pain of left thumb   [place] [facility] Melvin Pao, NP   3 months ago Primary hypertension   [place] [facility] Melvin Pao, NP   7  months ago Primary hypertension   [place] [facility] Melvin Pao, NP   1 year ago Annual physical exam   [facility] Melvin Pao, NP   1 year ago COPD exacerbation [facility])   [place] [facility] Val[facility] Melanie DASEN, NP       Future Appointments             In 1 month Melvin Pao, NP  [place] [facility]

## 2023-08-15 ENCOUNTER — Other Ambulatory Visit: Payer: Self-pay | Admitting: Nurse Practitioner

## 2023-08-16 NOTE — Telephone Encounter (Signed)
Requested Prescriptions  Pending Prescriptions Disp Refills   amLODipine (NORVASC) 5 MG tablet [Pharmacy Med Name: AMLODIPINE BESYLATE 5MG  TABLET] 90 tablet 0    Sig: TAKE ONE TABLET BY MOUTH ONCE A DAY. TAKE WITH THE 2.5 MG TABLET TO EQUAL A TOTAL DOSAGE OF 7.5 MG     Cardiovascular: Calcium Channel Blockers 2 Passed - 08/16/2023  9:55 AM      Passed - Last BP in normal range    BP Readings from Last 1 Encounters:  06/06/23 131/74         Passed - Last Heart Rate in normal range    Pulse Readings from Last 1 Encounters:  06/06/23 74         Passed - Valid encounter within last 6 months    Recent Outpatient Visits           2 months ago Chronic pain of left thumb   [place] [facility] Larae Grooms, NP   4 months ago Primary hypertension   [place] [facility] Larae Grooms, NP   8 months ago Primary hypertension   [facility] Larae Grooms, NP   1 year ago Annual physical exam   [place] [facility] Larae Grooms, NP   1 year ago COPD exacerbation [facility])   [place] [facility] Marjie Skiff, NP       Future Appointments             In 2 weeks Larae Grooms, NP [facility]

## 2023-08-24 NOTE — Unmapped (Signed)
 Sherrlyn Hock requested a refill of their Xifaxan via [employer]. [facility] has scheduled delivery per the patients request via [employer] to be delivered to their prescription address on 08/28/2023.

## 2023-08-25 MED FILL — XIFAXAN 550 MG TABLET: ORAL | 30 days supply | Qty: 60 | Fill #0

## 2023-08-31 NOTE — Unmapped (Signed)
 [facility]  09/01/2023    Reason for visit: Follow up for Hepatocellular carcinoma (CMS-HCC) [C22.0]    Assessment & Plan  Hepatocellular Carcinoma (HCC)  Early stage HCC treated with TACE in February and September 2023. No signs of recurrence on subsequent scans. Continuing to monitor LR-4.  -Reviewed MRI today, pending final radiologist read.  -Will discuss findings at hepatobiliary tumor board    ADDENDUM: MRI with TR-equivocal seg VI/VII lesion and similar size seg VI lesion, now characterized as LR-M. Will review in HB    Cirrhosis  Patient reports improvement in hepatic encephalopathy with Rifaximin. No current use of diuretics.  -Continue Rifaximin.    Esophageal Varices, non-bleeding  Due for surveillance upper endoscopy.  -Call to schedule upper endoscopy - number provided    Liver Transplant Evaluation  Discussed the process, benefits, and drawbacks of liver transplant. Patient wishes to delay the decision.  -Will hold off on initiating transplant evaluation for now.    Breast Cancer Screening  Patient reports new arm pain, concerned about lymph nodes.  -Plan for mammogram.  -Repeat CT chest with next follow-up MRI in 3 months.    Follow-up in 3 months.     I personally spent 30 minutes face-to-face and non-face-to-face in the care of this patient, which includes all pre, intra, and post visit time on the date of service.  All documented time was specific to the E/M visit and does not include any procedures that may have been performed. This included 10 minutes in review of labs, imaging and prior notes and 20 minutes in face-to-face care including history taking, physical exam and counseling      Tawni Carnes, MD, MPH  Assistant Professor of Medicine  [facility]  [facility]  Division of Gastroenterology and Hepatology      Subjective   History of Present Illness     63 y.o. 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     She inquires about her remission status and mentions having a scan today, although it has not been read yet. She recalls undergoing transarterial chemoembolization (TACE) in February and September 2023 for early stage hepatocellular carcinoma and has had no signs of recurrence on scans since then. She mentions a LIRADS 4 lesion that is being monitored closely. She is not on any diuretics and is taking rifaximin, which has improved her symptoms of confusion.    She discusses the possibility of a liver transplant and mentions that the transplant coordinator had difficulty reaching her. She has concerns about the logistics of frequent visits for transplant evaluation, as she relies on her daughter and uncle for transportation. She is considering the timing of the transplant evaluation and wants to wait and see how her condition progresses.    She feels tired and has a headache today, attributing some symptoms to allergies, which she states 'mess with me all year.' No cold symptoms are present. No recent hospitalizations or significant liver-related health events.    She has started exercising recently and reports soreness in her arms, questioning if it could be related to lymph nodes. She had a CT scan of her chest in October and is considering a mammogram due to concerns about  cancer, especially after learning about her neighbor's condition.    She is not feeling well today, attributing it to poor sleep over the past two nights. Her daughter recently had a medical procedure, which may be contributing to her current stress and fatigue.     =============================================================    FHSI-8 (Ver. 4)  -----------------------------------------------------------------------------------------------------------    I have a lack of energy      3 (Quite a bit)    I have nausea       2 (Somewhat)    I have pain        1 (A little bit)    I am losing weight       0 (Not at all)    I have pain in my back      1 (A little bit)    I feel fatigued       2 (Somewhat)    I am bothered by jaundice or yellow color to my skin  0 (Not at all)    I have discomfort or pain in my stomach area   0 (Not at all)  =============================================================    Objective   Physical Exam   Vital Signs: BP 171/73  - Pulse 91  - Resp 18  - Ht 162.6 cm (5' 4)  - Wt 56 kg (123 lb 6.4 oz)  - SpO2 95%  - BMI 21.18 kg/m??   Constitutional: She is in no apparent distress  Eyes: Anicteric sclerae  Cardiovascular: No peripheral edema  Gastrointestinal: Soft, nontender abdomen without hepatosplenomegaly, hernias, or masses  Neurologic: Awake, alert, and oriented to person, place, and time with normal speech and no asterixis

## 2023-09-01 ENCOUNTER — Ambulatory Visit: Admit: 2023-09-01 | Discharge: 2023-09-02 | Payer: PRIVATE HEALTH INSURANCE

## 2023-09-01 ENCOUNTER — Inpatient Hospital Stay: Admit: 2023-09-01 | Discharge: 2023-09-02 | Payer: PRIVATE HEALTH INSURANCE

## 2023-09-01 ENCOUNTER — Other Ambulatory Visit: Admit: 2023-09-01 | Discharge: 2023-09-02 | Payer: PRIVATE HEALTH INSURANCE

## 2023-09-01 DIAGNOSIS — C220 Liver cell carcinoma: Principal | ICD-10-CM

## 2023-09-01 DIAGNOSIS — R932 Abnormal findings on diagnostic imaging of liver and biliary tract: Secondary | ICD-10-CM | POA: Diagnosis not present

## 2023-09-01 LAB — CBC
HEMATOCRIT: 44.1 % — ABNORMAL HIGH (ref 34.0–44.0)
HEMOGLOBIN: 15.3 g/dL — ABNORMAL HIGH (ref 11.3–14.9)
MEAN CORPUSCULAR HEMOGLOBIN CONC: 34.7 g/dL (ref 32.0–36.0)
MEAN CORPUSCULAR HEMOGLOBIN: 32.8 pg — ABNORMAL HIGH (ref 25.9–32.4)
MEAN CORPUSCULAR VOLUME: 94.3 fL (ref 77.6–95.7)
MEAN PLATELET VOLUME: 7.7 fL (ref 6.8–10.7)
PLATELET COUNT: 224 10*9/L (ref 150–450)
RED BLOOD CELL COUNT: 4.68 10*12/L (ref 3.95–5.13)
RED CELL DISTRIBUTION WIDTH: 13.3 % (ref 12.2–15.2)
WBC ADJUSTED: 9.2 10*9/L (ref 3.6–11.2)

## 2023-09-01 LAB — COMPREHENSIVE METABOLIC PANEL
ALBUMIN: 4.5 g/dL (ref 3.4–5.0)
ALKALINE PHOSPHATASE: 126 U/L — ABNORMAL HIGH (ref 46–116)
ALT (SGPT): 25 U/L (ref 10–49)
ANION GAP: 12 mmol/L (ref 5–14)
AST (SGOT): 30 U/L (ref <=34)
BILIRUBIN TOTAL: 0.5 mg/dL (ref 0.3–1.2)
BLOOD UREA NITROGEN: 21 mg/dL (ref 9–23)
BUN / CREAT RATIO: 27
CALCIUM: 9.8 mg/dL (ref 8.7–10.4)
CHLORIDE: 99 mmol/L (ref 98–107)
CO2: 27 mmol/L (ref 20.0–31.0)
CREATININE: 0.77 mg/dL (ref 0.55–1.02)
EGFR CKD-EPI (2021) FEMALE: 87 mL/min/{1.73_m2} (ref >=60)
GLUCOSE RANDOM: 119 mg/dL (ref 70–179)
POTASSIUM: 4.5 mmol/L (ref 3.5–5.1)
PROTEIN TOTAL: 7.7 g/dL (ref 5.7–8.2)
SODIUM: 138 mmol/L (ref 135–145)

## 2023-09-01 LAB — PROTIME-INR
INR: 0.95
PROTIME: 10.8 s (ref 9.9–12.6)

## 2023-09-01 LAB — AFP TUMOR MARKER: AFP-TUMOR MARKER: 30 ng/mL — ABNORMAL HIGH (ref <=8)

## 2023-09-01 MED ADMIN — gadopiclenol (ELUCIREM,VUEWAY) injection 5.5 mL: 5.5 mL | INTRAVENOUS | @ 18:00:00 | Stop: 2023-09-01

## 2023-09-04 ENCOUNTER — Ambulatory Visit: Payer: Medicaid Other | Admitting: Nurse Practitioner

## 2023-09-04 NOTE — Progress Notes (Unsigned)
 LMP  (LMP Unknown)    Subjective:    Patient ID: Monique Tucker, female    DOB: 08/13/1960, 63 y.o.   MRN: [id number]  HPI: Monique Tucker is a 63 y.o. female  No chief complaint on file.   HYPERTENSION / HYPERLIPIDEMIA Satisfied with current treatment? yes Duration of hypertension: years BP monitoring frequency: not checking BP range:  BP medication side effects: no Past BP meds: Propranolol, amlodipine, and Losartan Duration of hyperlipidemia: years Cholesterol medication side effects: no Cholesterol supplements: none Past cholesterol medications: none Medication compliance: good compliance Aspirin: no Recent stressors: no Recurrent headaches: headaches Visual changes: no Palpitations: no Dyspnea: no Chest pain: no Lower extremity edema: no Dizzy/lightheaded: no  MOOD Followed by Psychiatry.  She has been out of her Seroquel recently.  She continues to follow up with them.  She sees them monthly on the phone.  COPD Doing well with Spiriva and Advair.  Wheezing when she lays down at night.  Using Singulair but not antihistamine.  COPD status: controlled Satisfied with current treatment?: no Oxygen use: no Dyspnea frequency:  Cough frequency: when she smokes and doesn't use her inhaler Rescue inhaler frequency:   Limitation of activity: no Productive cough:  Last Spirometry:  Pneumovax: Up to Date Influenza: Up to Date She is still cutting back on her smoking.    Patient states she hasn't made an appt to be seen for her left thumb.  She has tried tylenol and ibuprofen which didn't help but BC powders do help with the pain.     Relevant past medical, surgical, family and social history reviewed and updated as indicated. Interim medical history since our last visit reviewed. Allergies and medications reviewed and updated.  Review of Systems  Eyes:  Negative for visual disturbance.  Respiratory:  Negative for cough, chest tightness and shortness of breath.    Cardiovascular:  Negative for chest pain, palpitations and leg swelling.  Musculoskeletal:        Left thumb pain  Neurological:  Negative for dizziness and headaches.  Psychiatric/Behavioral:  Positive for dysphoric mood. Negative for suicidal ideas. The patient is nervous/anxious.     Per HPI unless specifically indicated above     Objective:    LMP  (LMP Unknown)   Wt Readings from Last 3 Encounters:  06/06/23 120 lb 9.6 oz (54.7 kg)  04/17/23 120 lb 9.6 oz (54.7 kg)  12/06/22 114 lb 9.6 oz (52 kg)    Physical Exam Vitals and nursing note reviewed.  Constitutional:      General: She is not in acute distress.    Appearance: Normal appearance. She is normal weight. She is not ill-appearing, toxic-appearing or diaphoretic.  HENT:     Head: Normocephalic.     Right Ear: External ear normal.     Left Ear: External ear normal.     Nose: Nose normal.     Mouth/Throat:     Mouth: Mucous membranes are moist.     Pharynx: Oropharynx is clear.  Eyes:     General:        Right eye: No discharge.        Left eye: No discharge.     Extraocular Movements: Extraocular movements intact.     Conjunctiva/sclera: Conjunctivae normal.     Pupils: Pupils are equal, round, and reactive to light.  Cardiovascular:     Rate and Rhythm: Normal rate and regular rhythm.     Heart sounds: No murmur heard. Pulmonary:  Effort: Pulmonary effort is normal. No respiratory distress.     Breath sounds: Normal breath sounds. No wheezing or rales.  Musculoskeletal:     Cervical back: Normal range of motion and neck supple.  Skin:    General: Skin is warm and dry.     Capillary Refill: Capillary refill takes less than 2 seconds.  Neurological:     General: No focal deficit present.     Mental Status: She is alert and oriented to person, place, and time. Mental status is at baseline.  Psychiatric:        Mood and Affect: Mood normal.        Behavior: Behavior normal.        Thought Content:  Thought content normal.        Judgment: Judgment normal.     Results for orders placed or performed in visit on 04/17/23  Comp Met (CMET)   Collection Time: 04/17/23  4:10 PM  Result Value Ref Range   Glucose 184 (H) 70 - 99 mg/dL   BUN 13 8 - 27 mg/dL   Creatinine, Ser 3.47 0.57 - 1.00 mg/dL   eGFR 77 >42 VZ/DGL/8.75   BUN/Creatinine Ratio 15 12 - 28   Sodium 138 134 - 144 mmol/L   Potassium 4.1 3.5 - 5.2 mmol/L   Chloride 102 96 - 106 mmol/L   CO2 20 20 - 29 mmol/L   Calcium 9.1 8.7 - 10.3 mg/dL   Total Protein 7.0 6.0 - 8.5 g/dL   Albumin 4.5 3.9 - 4.9 g/dL   Globulin, Total 2.5 1.5 - 4.5 g/dL   Bilirubin Total 0.2 0.0 - 1.2 mg/dL   Alkaline Phosphatase 126 (H) 44 - 121 IU/L   AST 28 0 - 40 IU/L   ALT 22 0 - 32 IU/L  Lipid Profile   Collection Time: 04/17/23  4:10 PM  Result Value Ref Range   Cholesterol, Total 196 100 - 199 mg/dL   Triglycerides 77 0 - 149 mg/dL   HDL 56 >64 mg/dL   VLDL Cholesterol Cal 14 5 - 40 mg/dL   LDL Chol Calc (NIH) 332 (H) 0 - 99 mg/dL   Chol/HDL Ratio 3.5 0.0 - 4.4 ratio  HgB A1c   Collection Time: 04/17/23  4:10 PM  Result Value Ref Range   Hgb A1c MFr Bld 5.6 4.8 - 5.6 %   Est. average glucose Bld gHb Est-mCnc 114 mg/dL      Assessment & Plan:   Problem List Items Addressed This Visit       Cardiovascular and Mediastinum   Hypertension     Respiratory   Moderate COPD (chronic obstructive pulmonary disease) (HCC)     Digestive   Other cirrhosis of liver (HCC)     Other   Bipolar 1 disorder (HCC) - Primary       Follow up plan: No follow-ups on file.

## 2023-09-06 NOTE — Unmapped (Signed)
 Called and left voicemail of date for 3 month follow up with Dr.Andrew Moon. Informed patient that once she looks at her chart if dates and times do not work she can send a message and we will reschedule them for her.

## 2023-09-13 NOTE — Unmapped (Signed)
 Requested scheduling into Dr. Humphrey Rolls Wednesday clinic for discussion regarding liver lesion ablation

## 2023-09-13 NOTE — Unmapped (Signed)
 MULTIDISCIPLINARY HEPATOBILIARY CONFERENCE DISPOSITION NOTE    09/13/23    DISCUSSION:   63 y/o F with BCLC-A, multifocal within [place] s/p TACE 08/06/2021 with recurrent LR-5 lesion s/p repeat TACE 03/07/2022    FINDINGS:  1.4 cm LR-M lesion; appears suspicious for recurrent HCC    PLAN:  - Referral to transplant surgery for lap ablation    Discussed this plan with the patient who is in agreement.    Tawni Carnes, MD, MPH  Assistant Professor of Medicine  [facility]  Division of Gastroenterology and Hepatology

## 2023-09-14 NOTE — Unmapped (Signed)
 I relayed the message from Ted Mcalpine in regards to her question:     Dr. Waynetta Sandy placed a referral to Transplant surgery for lap ablation. Transplant surgery should be contacting her to schedule.

## 2023-09-14 NOTE — Unmapped (Signed)
 [facility] Other Call ONC Phone Room Smart Lists: General -     Hi,     Ezell  contacted the Communication Center regarding the following:    - Robin Velez is calling stating that she is suppose to be getting scheduled for a endoscopy procedure as well as a Laraposcopy or something to that affect. She would like a call  back from Dr. Laban Emperor nn or Dr Waynetta Sandy himself.     Please contact Talana back at [phone].    Thanks in advance,    Noland Fordyce  [facility] Communication Center   [phone]    [facility]

## 2023-09-19 ENCOUNTER — Other Ambulatory Visit: Payer: Self-pay | Admitting: Nurse Practitioner

## 2023-09-20 ENCOUNTER — Other Ambulatory Visit: Payer: Self-pay | Admitting: Nurse Practitioner

## 2023-09-20 NOTE — Telephone Encounter (Signed)
 Requested Prescriptions  Pending Prescriptions Disp Refills   gabapentin (NEURONTIN) 300 MG capsule [Pharmacy Med Name: GABAPENTIN 300MG  CAPSULE] 270 capsule 1    Sig: TAKE ONE CAPSULE BY MOUTH THREE TIMES A DAY     Neurology: Anticonvulsants - gabapentin Passed - 09/20/2023  2:07 PM      Passed - Cr in normal range and within 360 days    Creatinine, Ser  Date Value Ref Range Status  04/17/2023 0.85 0.57 - 1.00 mg/dL Final         Passed - Completed PHQ-2 or PHQ-9 in the last 360 days      Passed - Valid encounter within last 12 months    Recent Outpatient Visits           3 months ago Chronic pain of left thumb   [place] [facility] Larae Grooms, NP   5 months ago Primary hypertension   [place] [facility] Larae Grooms, NP   9 months ago Primary hypertension   [place] [facility] Larae Grooms, NP   1 year ago Annual physical exam   [place] [facility] Larae Grooms, NP   1 year ago COPD exacerbation [facility])   [place] [facility] [place], Dorie Rank, NP

## 2023-09-21 NOTE — Telephone Encounter (Signed)
 Requested Prescriptions  Pending Prescriptions Disp Refills   ADVAIR DISKUS 250-50 MCG/ACT AEPB [Pharmacy Med Name: ADVAIR DISKUS 250/50 AERO POW BR ACT] 60 each 2    Sig: INHALE ONE PUFF INTO THE LUNGS TWO TIMES DAILY     Pulmonology:  Combination Products Failed - 09/21/2023  5:00 PM      Failed - Valid encounter within last 12 months    Recent Outpatient Visits   None

## 2023-09-25 ENCOUNTER — Ambulatory Visit: Payer: Self-pay

## 2023-09-25 NOTE — Telephone Encounter (Signed)
  Chief Complaint: dizziness and lightheadedness Symptoms: dizziness and lightheaded Frequency: 3-4 days Pertinent Negatives: Patient denies fainting Disposition: [] ED /[] Urgent Care (no appt availability in office) / [x] Appointment(In office/virtual)/ []  [place] [facility]/ [] Home Care/ [] Refused Recommended Disposition /[] [place] [facility]/ []  Follow-up with PCP Additional Notes: pt states that for the last 3-4 days she has been having dizziness and lightheadedness.  Pt states that she take 2 sinus medications year round and has been using her netti pot. States that when she turns her head and closes her eyes she dizziness is worse. States could be sinus or maybe ear issues as she feels her ears may need to be flushed or cleaned. Patient states that she does have some pressure on her eyebrows.    Copied From CRM [id number]. Reason for Triage: Patient is calling to report that she is dizzy for about 3-4 days. Reporting when she blinks her eyes, when she stands up. Please advise    Reason for Disposition  [1] MILD dizziness (e.g., walking normally) AND [2] has been evaluated by doctor (or NP/PA) for this  Answer Assessment - Initial Assessment Questions 1. DESCRIPTION: "Describe your dizziness."     lightheaded 2. LIGHTHEADED: "Do you feel lightheaded?" (e.g., somewhat faint, woozy, weak upon standing)     Woozy  3. VERTIGO: "Do you feel like either you or the room is spinning or tilting?" (i.e. vertigo)     no 4. SEVERITY: "How bad is it?"  "Do you feel like you are going to faint?" "Can you stand and walk?"   - MILD: Feels slightly dizzy, but walking normally.   - MODERATE: Feels unsteady when walking, but not falling; interferes with normal activities (e.g., school, work).   - SEVERE: Unable to walk without falling, or requires assistance to walk without falling; feels like passing out now.      mod 5. ONSET:  "When did the dizziness begin?"     3-4 days 6. AGGRAVATING  FACTORS: "Does anything make it worse?" (e.g., standing, change in head position)     Change head position,  8. CAUSE: "What do you think is causing the dizziness?"     Sinus or ears 9. RECURRENT SYMPTOM: "Have you had dizziness before?" If Yes, ask: "When was the last time?" "What happened that time?"     Last year  10. OTHER SYMPTOMS: "Do you have any other symptoms?" (e.g., fever, chest pain, vomiting, diarrhea, bleeding)       Pressure across eyebrows, cough, pressure in ears  Protocols used: Dizziness - Lightheadedness-A-AH

## 2023-09-25 NOTE — Telephone Encounter (Signed)
 Has scheduled an appointment for 09/27/23

## 2023-09-26 DIAGNOSIS — C220 Liver cell carcinoma: Principal | ICD-10-CM

## 2023-09-26 NOTE — Unmapped (Signed)
 Called Ms. Leet back to provide the number for  GI procedures is [phone] option 2

## 2023-09-26 NOTE — Unmapped (Signed)
 Returned Call to Robin Velez after receiving call from the communication center in reference to her upcoming appts. She says that she is suppose to have 3 procedures done and when she called they said let us schedule these and they were suppose to call her back but she has not received a call back yet.  She says that she lost all of the numbers provided. She thinks she is suppose to have an endoscopy and lap Ablation.        Triage Recommendations:   I advised that I would reach out to the Transplant team RN who made a note in the chart on 3/19 and I would reach out to [place] for the Endoscopy number. I will call her back to update.  Advised that this request may take up to 24-48 hours depending on whether or not the team is in clinic.      Caller's Response:   She  was  appreciative of the call and is agreeable to this plan and will call back with any further questions/concerns      Outstanding tasks: Care team notified

## 2023-09-26 NOTE — Unmapped (Signed)
 [facility] Other Call ONC Phone Room Smart Lists: General -     Hi,     Sandi contacted the Communication Center regarding the following:    - Sandi stated she has questions in regards to her upcoming images and treatment plan    Please contact Sandi  at [phone] .    Thanks in advance,    Warnell Bureau  [facility]   [phone]    [facility]

## 2023-09-27 ENCOUNTER — Encounter: Payer: Self-pay | Admitting: Nurse Practitioner

## 2023-09-27 ENCOUNTER — Ambulatory Visit (INDEPENDENT_AMBULATORY_CARE_PROVIDER_SITE_OTHER): Admitting: Nurse Practitioner

## 2023-09-27 VITALS — BP 130/74 | HR 64 | Temp 98.7°F | Resp 15 | Wt 125.4 lb

## 2023-09-27 DIAGNOSIS — J0110 Acute frontal sinusitis, unspecified: Secondary | ICD-10-CM | POA: Diagnosis not present

## 2023-09-27 DIAGNOSIS — M545 Low back pain, unspecified: Secondary | ICD-10-CM

## 2023-09-27 DIAGNOSIS — R159 Full incontinence of feces: Secondary | ICD-10-CM

## 2023-09-27 DIAGNOSIS — N393 Stress incontinence (female) (male): Secondary | ICD-10-CM | POA: Diagnosis not present

## 2023-09-27 LAB — URINALYSIS, ROUTINE W REFLEX MICROSCOPIC
Bilirubin, UA: NEGATIVE
Glucose, UA: NEGATIVE
Ketones, UA: NEGATIVE
Leukocytes,UA: NEGATIVE
Nitrite, UA: NEGATIVE
Protein,UA: NEGATIVE
RBC, UA: NEGATIVE
Specific Gravity, UA: 1.01 (ref 1.005–1.030)
Urobilinogen, Ur: 0.2 mg/dL (ref 0.2–1.0)
pH, UA: 5.5 (ref 5.0–7.5)

## 2023-09-27 MED ORDER — BUSPIRONE HCL 10 MG PO TABS
10.0000 mg | ORAL_TABLET | Freq: Two times a day (BID) | ORAL | 1 refills | Status: DC
Start: 2023-09-27 — End: 2023-10-04

## 2023-09-27 MED ORDER — SPIRIVA RESPIMAT 2.5 MCG/ACT IN AERS
INHALATION_SPRAY | RESPIRATORY_TRACT | 11 refills | Status: AC
Start: 2023-09-27 — End: ?

## 2023-09-27 MED ORDER — METHYLPREDNISOLONE 4 MG PO TBPK: ORAL_TABLET | ORAL | 0 refills | Status: DC

## 2023-09-27 MED ORDER — FLUTICASONE-SALMETEROL 250-50 MCG/ACT IN AEPB: 1.0000 | INHALATION_SPRAY | Freq: Two times a day (BID) | RESPIRATORY_TRACT | 2 refills | Status: AC

## 2023-09-27 MED ORDER — ALBUTEROL SULFATE HFA 108 (90 BASE) MCG/ACT IN AERS: 2.0000 | INHALATION_SPRAY | Freq: Four times a day (QID) | RESPIRATORY_TRACT | 3 refills | Status: AC | PRN

## 2023-09-27 MED ORDER — ESTRADIOL 0.1 MG/GM VA CREA
0.5000 | TOPICAL_CREAM | VAGINAL | 11 refills | Status: DC
Start: 2023-09-28 — End: 2023-12-20

## 2023-09-27 MED ORDER — AMOXICILLIN 500 MG PO CAPS: 500.0000 mg | ORAL_CAPSULE | Freq: Two times a day (BID) | ORAL | 0 refills | Status: AC

## 2023-09-27 MED ORDER — METHOCARBAMOL 500 MG PO TABS: 500.0000 mg | ORAL_TABLET | Freq: Four times a day (QID) | ORAL | 0 refills | Status: DC

## 2023-09-27 NOTE — Progress Notes (Signed)
 BP 130/74 (BP Location: Left Arm, Patient Position: Sitting, Cuff Size: Normal)   Pulse 64   Temp 98.7 F (37.1 C) (Oral)   Resp 15   Wt 125 lb 6.4 oz (56.9 kg)   LMP  (LMP Unknown)   SpO2 96%   BMI 21.52 kg/m    Subjective:    Patient ID: Monique Tucker, female    DOB: 1960/09/09, 63 y.o.   MRN: [id number]  HPI: Monique Tucker is a 63 y.o. female  Chief Complaint  Patient presents with   Dizziness    Started about 5 days ago. Had feeling off and on for awhile. Always has allergies. Head was going to explode. Eye movements can make it worse.    Back Pain    Wonders if kidney infection. Started 3 days ago.    DIZZINESS Patient states these symptoms happen every now and then but have been consistent for the last 4-5 days.  Patient states she doesn't feel like the room is spinning but feels like she is walking crooked and leaning to one side.  Symptoms are worse when she moves her head or bends down.  Symptoms improve with laying down and being still.  Patient has tried mucinex- feels like it helped some.  Feels like the pressure in her forehead has improved.  She feels like she has a sinus infection.  When she blows her nose it is clear.  Her ears have been hurting off and on for a while.  Feels like she might have a build up of wax.     URINARY SYMPTOMS Symptoms started a couple of days ago.  Feels like she pulled something in her back but wants to make sure she doesn't have a UTI.   Dysuria: no Urinary frequency: no Urgency: no Small volume voids: no Symptom severity: no Urinary incontinence: no Foul odor: no Hematuria: no Abdominal pain: no Back pain: yes Suprapubic pain/pressure: no Flank pain: no Fever:  no Vomiting: no Relief with cranberry juice: no Relief with pyridium: no  Patient states she is having trouble with incontinence at both ends.  The estradiol cream does help with her urinary incontinence but needs help with her stool incontinence.  She needs  pads to help control her incontinence.  She needs longer pads to help with the symptoms.   Relevant past medical, surgical, family and social history reviewed and updated as indicated. Interim medical history since our last visit reviewed. Allergies and medications reviewed and updated.  Review of Systems  HENT:  Positive for sinus pressure and sinus pain.   Genitourinary:        Incontinence   Musculoskeletal:  Positive for back pain.  Neurological:  Positive for dizziness and headaches.    Per HPI unless specifically indicated above     Objective:    BP 130/74 (BP Location: Left Arm, Patient Position: Sitting, Cuff Size: Normal)   Pulse 64   Temp 98.7 F (37.1 C) (Oral)   Resp 15   Wt 125 lb 6.4 oz (56.9 kg)   LMP  (LMP Unknown)   SpO2 96%   BMI 21.52 kg/m   Wt Readings from Last 3 Encounters:  09/27/23 125 lb 6.4 oz (56.9 kg)  06/06/23 120 lb 9.6 oz (54.7 kg)  04/17/23 120 lb 9.6 oz (54.7 kg)    Physical Exam Vitals and nursing note reviewed.  Constitutional:      General: She is not in acute distress.    Appearance: Normal  appearance. She is normal weight. She is not ill-appearing, toxic-appearing or diaphoretic.  HENT:     Head: Normocephalic.     Right Ear: External ear normal. Tenderness present. A middle ear effusion is present.     Left Ear: External ear normal. Tenderness present. A middle ear effusion is present.     Nose:     Right Sinus: Frontal sinus tenderness present.     Left Sinus: Frontal sinus tenderness present.     Mouth/Throat:     Mouth: Mucous membranes are moist.     Pharynx: Oropharynx is clear.  Eyes:     General:        Right eye: No discharge.        Left eye: No discharge.     Extraocular Movements: Extraocular movements intact.     Conjunctiva/sclera: Conjunctivae normal.     Pupils: Pupils are equal, round, and reactive to light.  Cardiovascular:     Rate and Rhythm: Normal rate and regular rhythm.     Heart sounds: No murmur  heard. Pulmonary:     Effort: Pulmonary effort is normal. No respiratory distress.     Breath sounds: Normal breath sounds. No wheezing or rales.  Musculoskeletal:     Cervical back: Normal range of motion and neck supple.  Skin:    General: Skin is warm and dry.     Capillary Refill: Capillary refill takes less than 2 seconds.  Neurological:     General: No focal deficit present.     Mental Status: She is alert and oriented to person, place, and time. Mental status is at baseline.  Psychiatric:        Mood and Affect: Mood normal.        Behavior: Behavior normal.        Thought Content: Thought content normal.        Judgment: Judgment normal.     Results for orders placed or performed in visit on 09/27/23  Urinalysis, Routine w reflex microscopic   Collection Time: 09/27/23 11:14 AM  Result Value Ref Range   Specific Gravity, UA 1.010 1.005 - 1.030   pH, UA 5.5 5.0 - 7.5   Color, UA Yellow Yellow   Appearance Ur Clear Clear   Leukocytes,UA Negative Negative   Protein,UA Negative Negative/Trace   Glucose, UA Negative Negative   Ketones, UA Negative Negative   RBC, UA Negative Negative   Bilirubin, UA Negative Negative   Urobilinogen, Ur 0.2 0.2 - 1.0 mg/dL   Nitrite, UA Negative Negative   Microscopic Examination Comment       Assessment & Plan:   Problem List Items Addressed This Visit       Other   SUI (stress urinary incontinence, female)   Ongoing problem.  Has worsened in recent months.  Having trouble with bladder control. Wears pads daily.  Would like to restart estradiol cream.  It has helped in the past.       Other Visit Diagnoses       Acute low back pain without sciatica, unspecified back pain laterality    -  Primary   Will check UA in office. Suspect it is related to muscular pain. Continue with muscle relaxer PRN for pain. Continue with heating pad.   Relevant Medications   methocarbamol (ROBAXIN) 500 MG tablet   methylPREDNISolone (MEDROL  DOSEPAK) 4 MG TBPK tablet   Other Relevant Orders   Urinalysis, Routine w reflex microscopic (Completed)     Acute  non-recurrent frontal sinusitis       Likely the cause of Dizziness. Will treat with medrol dose pak and Amoxillin to treat symptoms. Follow up if not improved.   Relevant Medications   amoxicillin (AMOXIL) 500 MG capsule   methylPREDNISolone (MEDROL DOSEPAK) 4 MG TBPK tablet     Incontinence of feces, unspecified fecal incontinence type       Referral placed for GI.   Relevant Orders   Ambulatory referral to Gastroenterology        Follow up plan: Return in about 1 week (around 10/04/2023) for Depression/Anxiety FU.

## 2023-09-27 NOTE — Assessment & Plan Note (Signed)
 Ongoing problem.  Has worsened in recent months.  Having trouble with bladder control. Wears pads daily.  Would like to restart estradiol cream.  It has helped in the past.

## 2023-10-02 ENCOUNTER — Telehealth: Payer: Self-pay

## 2023-10-02 NOTE — Telephone Encounter (Signed)
 PA for Estradiol initiated and submitted via Cover My Meds. Key: [facility]

## 2023-10-04 ENCOUNTER — Encounter: Payer: Self-pay | Admitting: Nurse Practitioner

## 2023-10-04 ENCOUNTER — Telehealth: Admitting: Nurse Practitioner

## 2023-10-04 DIAGNOSIS — F319 Bipolar disorder, unspecified: Secondary | ICD-10-CM

## 2023-10-04 MED ORDER — EPINEPHRINE 0.3 MG/0.3ML IJ SOAJ: INTRAMUSCULAR | 1 refills | Status: DC

## 2023-10-04 MED ORDER — BUSPIRONE HCL 10 MG PO TABS: 20.0000 mg | ORAL_TABLET | Freq: Two times a day (BID) | ORAL | Status: DC

## 2023-10-04 MED ORDER — VENLAFAXINE HCL ER 75 MG PO CP24: 75.0000 mg | ORAL_CAPSULE | Freq: Every day | ORAL | 0 refills | Status: DC

## 2023-10-04 NOTE — Unmapped (Signed)
 Per Dr. Waynetta Sandy, patient does not want to be evaluated for transplant right now.  Referral closed.

## 2023-10-04 NOTE — Assessment & Plan Note (Signed)
 Chronic.  Not well controlled.  Will increase Buspar to 20mg  TID.  Will increase Effexor to 225mg  daily.  Follow up in 1 month.  Call sooner if concerns arise.

## 2023-10-04 NOTE — Progress Notes (Signed)
 LMP  (LMP Unknown)    Subjective:    Patient ID: Monique Tucker, female    DOB: [birth year], 63 y.o.   MRN: [id number]  HPI: Monique Tucker is a 63 y.o. female  Chief Complaint  Patient presents with   Depression/Anxiety    Anxious due to liver news she just received. Spot was found. Does feel maybe a little biploar right now with extreme high and lows but not manic. Weather is not helping.    Dizziness    Finished prednisone but a few days left off ABX. Has improved but still /10 dizziness and fluid in ears L>R   MOOD Anxious due to liver news she just received. Spot was found. Does feel maybe a little biploar right now with extreme high and lows but not manic. Weather is not helping.  She feels like she isn't managed well at this time.  Her anxiety is not well controlled.  Denies SI.    Relevant past medical, surgical, family and social history reviewed and updated as indicated. Interim medical history since our last visit reviewed. Allergies and medications reviewed and updated.  Review of Systems  Psychiatric/Behavioral:  Positive for dysphoric mood. Negative for suicidal ideas. The patient is nervous/anxious.     Per HPI unless specifically indicated above     Objective:    LMP  (LMP Unknown)   Wt Readings from Last 3 Encounters:  09/27/23 125 lb 6.4 oz (56.9 kg)  06/06/23 120 lb 9.6 oz (54.7 kg)  04/17/23 120 lb 9.6 oz (54.7 kg)    Physical Exam Vitals and nursing note reviewed.  Constitutional:      General: She is not in acute distress.    Appearance: She is not ill-appearing.  HENT:     Head: Normocephalic.     Right Ear: Hearing normal.     Left Ear: Hearing normal.     Nose: Nose normal.  Pulmonary:     Effort: Pulmonary effort is normal. No respiratory distress.  Neurological:     Mental Status: She is alert.  Psychiatric:        Mood and Affect: Mood normal.        Behavior: Behavior normal.        Thought Content: Thought content normal.         Judgment: Judgment normal.     Results for orders placed or performed in visit on 09/27/23  Urinalysis, Routine w reflex microscopic   Collection Time: 09/27/23 11:14 AM  Result Value Ref Range   Specific Gravity, UA 1.010 1.005 - 1.030   pH, UA 5.5 5.0 - 7.5   Color, UA Yellow Yellow   Appearance Ur Clear Clear   Leukocytes,UA Negative Negative   Protein,UA Negative Negative/Trace   Glucose, UA Negative Negative   Ketones, UA Negative Negative   RBC, UA Negative Negative   Bilirubin, UA Negative Negative   Urobilinogen, Ur 0.2 0.2 - 1.0 mg/dL   Nitrite, UA Negative Negative   Microscopic Examination Comment       Assessment & Plan:   Problem List Items Addressed This Visit       Other   Bipolar 1 disorder (HCC) - Primary   Chronic.  Not well controlled.  Will increase Buspar to 20mg  TID.  Will increase Effexor to 225mg  daily.  Follow up in 1 month.  Call sooner if concerns arise.         Follow up plan: Return in about 1 month (around  11/03/2023) for Depression/Anxiety FU (virtual).   This visit was completed via MyChart due to the restrictions of the COVID-19 pandemic. All issues as above were discussed and addressed. Physical exam was done as above through visual confirmation on MyChart. If it was felt that the patient should be evaluated in the office, they were directed there. The patient verbally consented to this visit. Location of the patient: Home Location of the provider: Office Those involved with this call:  Provider: Larae Grooms, NP CMA: Arnetha Gula, CMA Front Desk/Registration: Servando Snare This encounter was conducted via video.  I spent 30 dedicated to the care of this patient on the date of this encounter to include previsit review of symptoms, plan of care and follow up, face to face time with the patient, and post visit ordering of testing.

## 2023-10-09 ENCOUNTER — Other Ambulatory Visit: Payer: Self-pay | Admitting: Nurse Practitioner

## 2023-10-10 NOTE — Telephone Encounter (Signed)
 Requested medication (s) are due for refill today: yes  Requested medication (s) are on the active medication list: yes  Last refill:  09/27/23 #60   Future visit scheduled:  yes  Notes to clinic:  med not delegated to NT to RF   Requested Prescriptions  Pending Prescriptions Disp Refills   methocarbamol (ROBAXIN) 500 MG tablet [Pharmacy Med Name: METHOCARBAMOL 500MG  TABLET] 60 tablet 0    Sig: TAKE ONE TABLET (500 MG TOTAL) BY MOUTH FOUR TIMES DAILY.     Not Delegated - Analgesics:  Muscle Relaxants Failed - 10/10/2023  2:30 PM      Failed - This refill cannot be delegated      Failed - Valid encounter within last 6 months    Recent Outpatient Visits           6 days ago Bipolar 1 disorder [facility])   [place] [facility] Aileen Alexanders, NP   1 week ago Acute low back pain without sciatica, unspecified back pain laterality   [place] [facility] Aileen Alexanders, NP

## 2023-10-11 ENCOUNTER — Other Ambulatory Visit: Payer: Self-pay | Admitting: Nurse Practitioner

## 2023-10-12 NOTE — Telephone Encounter (Signed)
 Requested Prescriptions  Pending Prescriptions Disp Refills   montelukast (SINGULAIR) 10 MG tablet [Pharmacy Med Name: MONTELUKAST SODIUM 10MG  TABLET] 90 tablet 2    Sig: TAKE ONE TABLET BY MOUTH AT BEDTIME     Pulmonology:  Leukotriene Inhibitors Failed - 10/12/2023 11:22 AM      Failed - Valid encounter within last 12 months    Recent Outpatient Visits           1 week ago Bipolar 1 disorder [facility])   [place] [facility] Aileen Alexanders, NP   2 weeks ago Acute low back pain without sciatica, unspecified back pain laterality   [place] [facility] Aileen Alexanders, NP

## 2023-10-17 NOTE — Unmapped (Signed)
 Per Dr. Dione Franks placed call to patient to relay need to move 4/23 appt time to 0900 due to Dr. Dione Franks need to be in the OR emergently during currently scheduled appt time.  Confirmed that patient will be seen by Dr. Kapoor @ 0900 - she verbalized agreement/understanding.

## 2023-10-18 MED FILL — XIFAXAN 550 MG TABLET: ORAL | 28 days supply | Qty: 56 | Fill #4

## 2023-10-18 NOTE — Unmapped (Signed)
 Patient canceled appointment with Dr. 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 Left instructions for clinic contact and my contact information.   Also sent My Chart message

## 2023-11-06 ENCOUNTER — Telehealth: Admitting: Nurse Practitioner

## 2023-11-06 ENCOUNTER — Encounter: Payer: Self-pay | Admitting: Nurse Practitioner

## 2023-11-06 DIAGNOSIS — F319 Bipolar disorder, unspecified: Secondary | ICD-10-CM | POA: Diagnosis not present

## 2023-11-06 MED ORDER — METHYLPREDNISOLONE 4 MG PO TBPK: ORAL_TABLET | ORAL | 0 refills | Status: DC

## 2023-11-06 MED ORDER — CETIRIZINE HCL 10 MG PO TABS: 10.0000 mg | ORAL_TABLET | Freq: Every day | ORAL | 1 refills | Status: DC

## 2023-11-06 MED ORDER — BUSPIRONE HCL 30 MG PO TABS: 30.0000 mg | ORAL_TABLET | Freq: Two times a day (BID) | ORAL | 0 refills | Status: DC

## 2023-11-06 NOTE — Progress Notes (Signed)
 LMP  (LMP Unknown)    Subjective:    Patient ID: Monique Tucker, female    DOB: October 17, 1960, 63 y.o.   MRN: [id number]  HPI: Monique Tucker is a 63 y.o. female  Chief Complaint  Patient presents with   Depression/Anxiety    Going pretty good.    Cerumen Impaction    Still having an itch and tickle at times.    MOOD Patient feels like she is less on edge all the time. She doesn't feel like she is as irritable.  Doesn't necessarily feel less depressed or anxious.  Feels like the Buspar  was the most helpful.  Denies SI.      11/06/2023    2:43 PM 10/04/2023    2:20 PM 06/06/2023    3:50 PM  PHQ9 SCORE ONLY  PHQ-9 Total Score 11 12 2       11/06/2023    2:45 PM 10/04/2023    2:21 PM 06/06/2023    3:50 PM 04/17/2023    3:46 PM  GAD 7 : Generalized Anxiety Score  Nervous, Anxious, on Edge 2 2 2  0  Control/stop worrying 2 2 2  0  Worry too much - different things 2 2 2  0  Trouble relaxing 1 2 2  0  Restless 1 2 2  0  Easily annoyed or irritable 2 2 2  0  Afraid - awful might happen 1 2 2  0  Total GAD 7 Score 11 14 14  0  Anxiety Difficulty Somewhat difficult Somewhat difficult  Somewhat difficult     Relevant past medical, surgical, family and social history reviewed and updated as indicated. Interim medical history since our last visit reviewed. Allergies and medications reviewed and updated.  Review of Systems  HENT:         Itchy ears  Psychiatric/Behavioral:  Positive for dysphoric mood. Negative for suicidal ideas. The patient is nervous/anxious.     Per HPI unless specifically indicated above     Objective:    LMP  (LMP Unknown)   Wt Readings from Last 3 Encounters:  09/27/23 125 lb 6.4 oz (56.9 kg)  06/06/23 120 lb 9.6 oz (54.7 kg)  04/17/23 120 lb 9.6 oz (54.7 kg)    Physical Exam Vitals and nursing note reviewed.  Constitutional:      General: She is not in acute distress.    Appearance: She is not ill-appearing.  HENT:     Head: Normocephalic.      Right Ear: Hearing normal.     Left Ear: Hearing normal.     Nose: Nose normal.  Pulmonary:     Effort: Pulmonary effort is normal. No respiratory distress.  Neurological:     Mental Status: She is alert.  Psychiatric:        Mood and Affect: Mood normal.        Behavior: Behavior normal.        Thought Content: Thought content normal.        Judgment: Judgment normal.     Results for orders placed or performed in visit on 09/27/23  Urinalysis, Routine w reflex microscopic   Collection Time: 09/27/23 11:14 AM  Result Value Ref Range   Specific Gravity, UA 1.010 1.005 - 1.030   pH, UA 5.5 5.0 - 7.5   Color, UA Yellow Yellow   Appearance Ur Clear Clear   Leukocytes,UA Negative Negative   Protein,UA Negative Negative/Trace   Glucose, UA Negative Negative   Ketones, UA Negative Negative   RBC, UA  Negative Negative   Bilirubin, UA Negative Negative   Urobilinogen, Ur 0.2 0.2 - 1.0 mg/dL   Nitrite, UA Negative Negative   Microscopic Examination Comment       Assessment & Plan:   Problem List Items Addressed This Visit       Other   Bipolar 1 disorder (HCC) - Primary   Chronic.  Not well controlled.  Will increase Buspar  to 30mg  TID.  Continue with 225mg  daily.  Follow up in 6 weeks.  Call sooner if concerns arise.          Follow up plan: Return in about 6 weeks (around 12/18/2023) for Depression/Anxiety FU (virtual).   This visit was completed via MyChart due to the restrictions of the COVID-19 pandemic. All issues as above were discussed and addressed. Physical exam was done as above through visual confirmation on MyChart. If it was felt that the patient should be evaluated in the office, they were directed there. The patient verbally consented to this visit. Location of the patient: Home Location of the provider: Office Those involved with this call:  Provider: Aileen Alexanders, NP CMA: Althia Jetty, CMA Front Desk/Registration: Jaynee Meyer This encounter was  conducted via video.  I spent 30 mins dedicated to the care of this patient on the date of this encounter to include previsit review of symptoms, plan of care and follow up, face to face time with the patient, and post visit ordering of testing.

## 2023-11-06 NOTE — Assessment & Plan Note (Signed)
 Chronic.  Not well controlled.  Will increase Buspar  to 30mg  TID.  Continue with 225mg  daily.  Follow up in 6 weeks.  Call sooner if concerns arise.

## 2023-11-08 ENCOUNTER — Ambulatory Visit
Admit: 2023-11-08 | Discharge: 2023-11-09 | Payer: Medicaid (Managed Care) | Attending: Student in an Organized Health Care Education/Training Program | Primary: Student in an Organized Health Care Education/Training Program

## 2023-11-08 DIAGNOSIS — C220 Liver cell carcinoma: Principal | ICD-10-CM

## 2023-11-08 NOTE — Progress Notes (Signed)
 Appointment has been made

## 2023-11-10 NOTE — Unmapped (Signed)
 Patient left before she could be seen in clinic after her image review.

## 2023-11-23 ENCOUNTER — Other Ambulatory Visit: Payer: Self-pay | Admitting: Nurse Practitioner

## 2023-11-25 NOTE — Telephone Encounter (Signed)
 Requested medication (s) are due for refill today: yes  Requested medication (s) are on the active medication list: yes  Last refill:  09/27/23 #60/0  Future visit scheduled: yes  Notes to clinic:  Unable to refill per protocol, cannot delegate.      Requested Prescriptions  Pending Prescriptions Disp Refills   methocarbamol  (ROBAXIN ) 500 MG tablet [Pharmacy Med Name: METHOCARBAMOL  500MG  TABLET] 60 tablet 0    Sig: TAKE ONE TABLET (500 MG TOTAL) BY MOUTH FOUR TIMES DAILY.     Not Delegated - Analgesics:  Muscle Relaxants Failed - 11/25/2023  2:14 PM      Failed - This refill cannot be delegated      Failed - Valid encounter within last 6 months    Recent Outpatient Visits           2 weeks ago Bipolar 1 disorder [facility])   [place] [facility] Aileen Alexanders, NP   1 month ago Bipolar 1 disorder [facility])   [place] [facility] Aileen Alexanders, NP   1 month ago Acute low back pain without sciatica, unspecified back pain laterality   [place] [facility] Aileen Alexanders, NP              Refused Prescriptions Disp Refills   montelukast  (SINGULAIR ) 10 MG tablet [Pharmacy Med Name: MONTELUKAST  SODIUM 10MG  TABLET] 90 tablet 2    Sig: TAKE ONE TABLET BY MOUTH AT BEDTIME     Pulmonology:  Leukotriene Inhibitors Failed - 11/25/2023  2:14 PM      Failed - Valid encounter within last 12 months    Recent Outpatient Visits           2 weeks ago Bipolar 1 disorder [facility])   [place] [facility] Aileen Alexanders, NP   1 month ago Bipolar 1 disorder [facility])   [place] [facility] Aileen Alexanders, NP   1 month ago Acute low back pain without sciatica, unspecified back pain laterality   [place] [facility] Aileen Alexanders, NP

## 2023-11-25 NOTE — Telephone Encounter (Signed)
 Requested Prescriptions  Pending Prescriptions Disp Refills   montelukast  (SINGULAIR ) 10 MG tablet [Pharmacy Med Name: MONTELUKAST  SODIUM 10MG  TABLET] 90 tablet 2    Sig: TAKE ONE TABLET BY MOUTH AT BEDTIME     Pulmonology:  Leukotriene Inhibitors Failed - 11/25/2023  2:14 PM      Failed - Valid encounter within last 12 months    Recent Outpatient Visits           2 weeks ago Bipolar 1 disorder [facility])   [place] [facility] Aileen Alexanders, NP   1 month ago Bipolar 1 disorder [facility])   [place] [facility] Aileen Alexanders, NP   1 month ago Acute low back pain without sciatica, unspecified back pain laterality   [place] [facility] Aileen Alexanders, NP               methocarbamol  (ROBAXIN ) 500 MG tablet [Pharmacy Med Name: METHOCARBAMOL  500MG  TABLET] 60 tablet 0    Sig: TAKE ONE TABLET (500 MG TOTAL) BY MOUTH FOUR TIMES DAILY.     Not Delegated - Analgesics:  Muscle Relaxants Failed - 11/25/2023  2:14 PM      Failed - This refill cannot be delegated      Failed - Valid encounter within last 6 months    Recent Outpatient Visits           2 weeks ago Bipolar 1 disorder [facility])   [place] [facility] Aileen Alexanders, NP   1 month ago Bipolar 1 disorder [facility])   [place] [facility] Aileen Alexanders, NP   1 month ago Acute low back pain without sciatica, unspecified back pain laterality   [place] [facility] Aileen Alexanders, NP

## 2023-11-29 ENCOUNTER — Other Ambulatory Visit: Payer: Self-pay | Admitting: Nurse Practitioner

## 2023-11-29 DIAGNOSIS — K7682 Hepatic encephalopathy: Principal | ICD-10-CM

## 2023-11-30 ENCOUNTER — Other Ambulatory Visit: Payer: Self-pay | Admitting: Nurse Practitioner

## 2023-11-30 MED FILL — XIFAXAN 550 MG TABLET: ORAL | 28 days supply | Qty: 56 | Fill #5

## 2023-11-30 NOTE — Telephone Encounter (Signed)
 Requested Prescriptions  Pending Prescriptions Disp Refills   amLODipine  (NORVASC ) 5 MG tablet [Pharmacy Med Name: AMLODIPINE  BESYLATE 5MG  TABLET] 90 tablet 1    Sig: TAKE ONE TABLET BY MOUTH ONCE A DAY. TAKE WITH THE 2.5 MG TABLET TO EQUAL A TOTAL DOSAGE OF 7.5 MG     Cardiovascular: Calcium Channel Blockers 2 Failed - 11/30/2023 11:10 AM      Failed - Valid encounter within last 6 months    Recent Outpatient Visits           3 weeks ago Bipolar 1 disorder [facility])   [place] [facility] Aileen Alexanders, NP   1 month ago Bipolar 1 disorder [facility])   [place] [facility] Aileen Alexanders, NP   2 months ago Acute low back pain without sciatica, unspecified back pain laterality   [place] [facility] Aileen Alexanders, NP              Passed - Last BP in normal range    BP Readings from Last 1 Encounters:  09/27/23 130/74         Passed - Last Heart Rate in normal range    Pulse Readings from Last 1 Encounters:  09/27/23 64

## 2023-12-01 ENCOUNTER — Other Ambulatory Visit: Payer: Self-pay | Admitting: Nurse Practitioner

## 2023-12-01 NOTE — Telephone Encounter (Signed)
 Requested Prescriptions  Pending Prescriptions Disp Refills   venlafaxine  XR (EFFEXOR -XR) 75 MG 24 hr capsule [Pharmacy Med Name: VENLAFAXINE  HYDROCHLORIDE ER 75MG  ER CAPSULE ER 24HR] 60 capsule 0    Sig: TAKE ONE CAPSULE (75 MG TOTAL) BY MOUTH DAILY WITH BREAKFAST. (TO BE TAKEN WITH A 150 MG CAP)     Psychiatry: Antidepressants - SNRI - desvenlafaxine & venlafaxine  Failed - 12/01/2023 12:35 PM      Failed - Valid encounter within last 6 months    Recent Outpatient Visits           3 weeks ago Bipolar 1 disorder (HCC)   [place] [facility] Aileen Alexanders, NP   1 month ago Bipolar 1 disorder [facility])   [place] [facility] Aileen Alexanders, NP   2 months ago Acute low back pain without sciatica, unspecified back pain laterality   [place] [facility] Aileen Alexanders, NP              Failed - Lipid Panel in normal range within the last 12 months    Cholesterol, Total  Date Value Ref Range Status  04/17/2023 196 100 - 199 mg/dL Final   LDL Chol Calc (NIH)  Date Value Ref Range Status  04/17/2023 126 (H) 0 - 99 mg/dL Final   HDL  Date Value Ref Range Status  04/17/2023 56 >39 mg/dL Final   Triglycerides  Date Value Ref Range Status  04/17/2023 77 0 - 149 mg/dL Final         Passed - Cr in normal range and within 360 days    Creatinine, Ser  Date Value Ref Range Status  04/17/2023 0.85 0.57 - 1.00 mg/dL Final         Passed - Completed PHQ-2 or PHQ-9 in the last 360 days      Passed - Last BP in normal range    BP Readings from Last 1 Encounters:  09/27/23 130/74

## 2023-12-01 NOTE — Telephone Encounter (Signed)
 Requested Prescriptions  Pending Prescriptions Disp Refills   losartan  (COZAAR ) 100 MG tablet [Pharmacy Med Name: LOSARTAN  POTASSIUM 100MG  TABLET] 90 tablet 1    Sig: TAKE ONE TABLET (100 MG TOTAL) BY MOUTH DAILY.     Cardiovascular:  Angiotensin Receptor Blockers Failed - 12/01/2023  4:32 PM      Failed - Cr in normal range and within 180 days    Creatinine, Ser  Date Value Ref Range Status  04/17/2023 0.85 0.57 - 1.00 mg/dL Final         Failed - K in normal range and within 180 days    Potassium  Date Value Ref Range Status  04/17/2023 4.1 3.5 - 5.2 mmol/L Final         Failed - Valid encounter within last 6 months    Recent Outpatient Visits           3 weeks ago Bipolar 1 disorder [facility])   [place] [facility] Aileen Alexanders, NP   1 month ago Bipolar 1 disorder [facility])   [place] [facility] Aileen Alexanders, NP   2 months ago Acute low back pain without sciatica, unspecified back pain laterality   [place] [facility] Aileen Alexanders, NP              Passed - Patient is not pregnant      Passed - Last BP in normal range    BP Readings from Last 1 Encounters:  09/27/23 130/74

## 2023-12-06 NOTE — Unmapped (Unsigned)
 [facility]  12/08/2023    Reason for visit: {New/Return/Post:94740}    Assessment & Plan         Hepatocellular Carcinoma (HCC) with recurrence  - Referred to transplant surgery for lap ablation  - No LT eval     Cirrhosis  -Continue Rifaximin .  -Call to schedule upper endoscopy - number provided     Patient reports new arm pain, concerned about lymph nodes.  -Plan for mammogram.  -Repeat CT chest with next follow-up MRI in 3 months.    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 Referred to transplant surgery for lap ablation but canceled appointment and then patient left before visit on 5/14.    Rifaximin  for HE  EGD due for varices    Concerned about lymph nodes and breast cancer    - Interval events  - Hospitalizations  - Ascites  - HE  - Bleeding  - ECOG PS  - Alcohol and tobacco    =============================================================    FHSI-8 (Ver. 4)  -----------------------------------------------------------------------------------------------------------    I have a lack of energy      {FHSI-8 Score:115129}    I have nausea       {FHSI-8 Score:115129}    I have pain        {FHSI-8 Score:115129}    I am losing weight       {FHSI-8 Score:115129}    I have pain in my back      {FHSI-8 Score:115129}    I feel fatigued       {FHSI-8 Score:115129}    I am bothered by jaundice or yellow color to my skin  {FHSI-8 Score:115129}    I have discomfort or pain in my stomach area   {FHSI-8 Score:115129}  =============================================================    Objective   Physical Exam   Vital Signs: LMP  (LMP Unknown)   Constitutional: She is in no apparent distress  Eyes: Anicteric sclerae  Cardiovascular: No peripheral edema  Gastrointestinal: Soft, nontender abdomen without hepatosplenomegaly, hernias, or masses  Neurologic: Awake, alert, and oriented to person, place, and time with normal speech and no asterixis

## 2023-12-08 ENCOUNTER — Other Ambulatory Visit: Payer: Self-pay | Admitting: Nurse Practitioner

## 2023-12-08 ENCOUNTER — Ambulatory Visit: Admit: 2023-12-08 | Payer: Medicaid (Managed Care)

## 2023-12-08 ENCOUNTER — Inpatient Hospital Stay: Admit: 2023-12-08 | Payer: Medicaid (Managed Care)

## 2023-12-11 NOTE — Unmapped (Signed)
 Patient Robin Velez was contacted today regarding rescheduling an appointment. Appointment rescheduled and confirmed with patient.

## 2023-12-11 NOTE — Telephone Encounter (Signed)
 Requested medications are due for refill today.  yes  Requested medications are on the active medications list.  yes  Last refill. 11/27/2023 #60 0 rf  Future visit scheduled.   yes  Notes to clinic.  Refill not delegated.    Requested Prescriptions  Pending Prescriptions Disp Refills   methocarbamol  (ROBAXIN ) 500 MG tablet [Pharmacy Med Name: METHOCARBAMOL  500MG  TABLET] 60 tablet 0    Sig: TAKE ONE TABLET (500 MG TOTAL) BY MOUTH FOUR TIMES DAILY.     Not Delegated - Analgesics:  Muscle Relaxants Failed - 12/11/2023  4:10 PM      Failed - This refill cannot be delegated      Failed - Valid encounter within last 6 months    Recent Outpatient Visits           1 month ago Bipolar 1 disorder [facility])   [place] [facility] Aileen Alexanders, NP   2 months ago Bipolar 1 disorder [facility])   [place] [facility] Aileen Alexanders, NP   2 months ago Acute low back pain without sciatica, unspecified back pain laterality   [place] [facility] Aileen Alexanders, NP

## 2023-12-20 ENCOUNTER — Telehealth (INDEPENDENT_AMBULATORY_CARE_PROVIDER_SITE_OTHER): Admitting: Nurse Practitioner

## 2023-12-20 ENCOUNTER — Encounter: Payer: Self-pay | Admitting: Nurse Practitioner

## 2023-12-20 ENCOUNTER — Other Ambulatory Visit: Payer: Self-pay | Admitting: Nurse Practitioner

## 2023-12-20 VITALS — BP 126/67 | HR 67 | Ht 64.0 in | Wt 127.0 lb

## 2023-12-20 DIAGNOSIS — Z716 Tobacco abuse counseling: Secondary | ICD-10-CM

## 2023-12-20 DIAGNOSIS — F319 Bipolar disorder, unspecified: Secondary | ICD-10-CM

## 2023-12-20 MED ORDER — EPINEPHRINE 0.3 MG/0.3ML IJ SOAJ: INTRAMUSCULAR | 1 refills | Status: AC

## 2023-12-20 MED ORDER — DICLOFENAC SODIUM 1 % EX GEL: 2.0000 g | Freq: Four times a day (QID) | CUTANEOUS | 0 refills | Status: DC

## 2023-12-20 MED ORDER — VARENICLINE TARTRATE (STARTER) 0.5 MG X 11 & 1 MG X 42 PO TBPK: ORAL_TABLET | ORAL | 0 refills | Status: DC

## 2023-12-20 MED ORDER — ESTRADIOL 0.1 MG/GM VA CREA: 0.5000 | TOPICAL_CREAM | VAGINAL | 11 refills | Status: DC

## 2023-12-20 NOTE — Assessment & Plan Note (Signed)
 Chronic.  Controlled.  Continue with Buspar  to 30mg  TID.  Continue with 225mg  daily.  Follow up in 3 weeks.  Call sooner if concerns arise.

## 2023-12-20 NOTE — Progress Notes (Signed)
 BP 126/67   Pulse 67   Ht 5' 4 (1.626 m)   Wt 127 lb (57.6 kg)   LMP  (LMP Unknown)   BMI 21.80 kg/m    Subjective:    Patient ID: Monique Tucker, female    DOB: 04-26-1961, 63 y.o.   MRN: [id number]  HPI: Monique Tucker is a 63 y.o. female  Chief Complaint  Patient presents with   Medical Management of Chronic Issues   MOOD Patient states she feels like her mood has been better.  She would like to continue with the current regimen.  She is feeling less irritable and anxious.  Denies concerns at visit today.   Denies SI.      12/20/2023    2:32 PM 11/06/2023    2:43 PM 10/04/2023    2:20 PM  PHQ9 SCORE ONLY  PHQ-9 Total Score 10 11 12       12/20/2023    2:34 PM 11/06/2023    2:45 PM 10/04/2023    2:21 PM 06/06/2023    3:50 PM  GAD 7 : Generalized Anxiety Score  Nervous, Anxious, on Edge 1 2 2 2   Control/stop worrying 0 2 2 2   Worry too much - different things 0 2 2 2   Trouble relaxing 0 1 2 2   Restless 1 1 2 2   Easily annoyed or irritable 1 2 2 2   Afraid - awful might happen 0 1 2 2   Total GAD 7 Score 3 11 14 14   Anxiety Difficulty Somewhat difficult Somewhat difficult Somewhat difficult    SMOKING CESSATION Smoking Status: currently smoking Smoking Amount: 1ppd  Smoking Onset:  Smoking Quit Date:  Smoking triggers: stress  Type of tobacco use: cigarettes  Children in the house: no Other household members who smoke: yes Treatments attempted:  Pneumovax:    Relevant past medical, surgical, family and social history reviewed and updated as indicated. Interim medical history since our last visit reviewed. Allergies and medications reviewed and updated.  Review of Systems  HENT:         Itchy ears  Psychiatric/Behavioral:  Positive for dysphoric mood. Negative for suicidal ideas. The patient is nervous/anxious.     Per HPI unless specifically indicated above     Objective:    BP 126/67   Pulse 67   Ht 5' 4 (1.626 m)   Wt 127 lb (57.6 kg)   LMP   (LMP Unknown)   BMI 21.80 kg/m   Wt Readings from Last 3 Encounters:  12/20/23 127 lb (57.6 kg)  09/27/23 125 lb 6.4 oz (56.9 kg)  06/06/23 120 lb 9.6 oz (54.7 kg)    Physical Exam Vitals and nursing note reviewed.  Constitutional:      General: She is not in acute distress.    Appearance: She is not ill-appearing.  HENT:     Head: Normocephalic.     Right Ear: Hearing normal.     Left Ear: Hearing normal.     Nose: Nose normal.  Pulmonary:     Effort: Pulmonary effort is normal. No respiratory distress.   Neurological:     Mental Status: She is alert.   Psychiatric:        Mood and Affect: Mood normal.        Behavior: Behavior normal.        Thought Content: Thought content normal.        Judgment: Judgment normal.     Results for orders placed or performed  in visit on 09/27/23  Urinalysis, Routine w reflex microscopic   Collection Time: 09/27/23 11:14 AM  Result Value Ref Range   Specific Gravity, UA 1.010 1.005 - 1.030   pH, UA 5.5 5.0 - 7.5   Color, UA Yellow Yellow   Appearance Ur Clear Clear   Leukocytes,UA Negative Negative   Protein,UA Negative Negative/Trace   Glucose, UA Negative Negative   Ketones, UA Negative Negative   RBC, UA Negative Negative   Bilirubin, UA Negative Negative   Urobilinogen, Ur 0.2 0.2 - 1.0 mg/dL   Nitrite, UA Negative Negative   Microscopic Examination Comment       Assessment & Plan:   Problem List Items Addressed This Visit       Other   Bipolar 1 disorder (HCC) - Primary   Chronic.  Controlled.  Continue with Buspar  to 30mg  TID.  Continue with 225mg  daily.  Follow up in 3 weeks.  Call sooner if concerns arise.       Other Visit Diagnoses       Encounter for smoking cessation counseling       Will send Chantix for patient during visit.  Side effects and benefits of medication discussed.  Follow up in 3 months.          Follow up plan: Return in about 3 months (around 03/21/2024) for HTN, HLD, DM2  FU.   This visit was completed via MyChart due to the restrictions of the COVID-19 pandemic. All issues as above were discussed and addressed. Physical exam was done as above through visual confirmation on MyChart. If it was felt that the patient should be evaluated in the office, they were directed there. The patient verbally consented to this visit. Location of the patient: Home Location of the provider: Office Those involved with this call:  Provider: Darice Petty, NP CMA: Izetta Sarah, CMA Front Desk/Registration: Claretta Maiden This encounter was conducted via video.  I spent 30 mins dedicated to the care of this patient on the date of this encounter to include previsit review of symptoms, plan of care and follow up, face to face time with the patient, and post visit ordering of testing.

## 2023-12-21 NOTE — Telephone Encounter (Signed)
 Requested Prescriptions  Pending Prescriptions Disp Refills   amLODipine  (NORVASC ) 2.5 MG tablet [Pharmacy Med Name: AMLODIPINE  BESYLATE 2.5MG  TABLET] 90 tablet 1    Sig: TAKE ONE TABLET BY MOUTH ONCE A DAY. TO TAKE IN CONJUNCTION WITH FIVE MG TABLET TO = A DOSE OF 7.5 MG     Cardiovascular: Calcium Channel Blockers 2 Failed - 12/21/2023  3:01 PM      Failed - Valid encounter within last 6 months    Recent Outpatient Visits           Yesterday Bipolar 1 disorder [facility])   [facility] Melvin Pao, NP   1 month ago Bipolar 1 disorder [facility])   [place] [facility] Melvin Pao, NP   2 months ago Bipolar 1 disorder [facility])   [place] [facility] Melvin Pao, NP   2 months ago Acute low back pain without sciatica, unspecified back pain laterality   [place] [facility] Melvin Pao, NP              Passed - Last BP in normal range    BP Readings from Last 1 Encounters:  12/20/23 126/67         Passed - Last Heart Rate in normal range    Pulse Readings from Last 1 Encounters:  12/20/23 67

## 2023-12-22 NOTE — Progress Notes (Signed)
 LMOM for patient to call the office and schedule a 3 month appt with provider

## 2023-12-27 ENCOUNTER — Other Ambulatory Visit (HOSPITAL_COMMUNITY): Payer: Self-pay

## 2023-12-27 ENCOUNTER — Telehealth: Payer: Self-pay

## 2023-12-27 MED ORDER — PREMARIN 0.625 MG/GM VA CREA: 1.0000 | TOPICAL_CREAM | Freq: Every day | VAGINAL | 12 refills | Status: DC

## 2023-12-27 MED FILL — XIFAXAN 550 MG TABLET: ORAL | 28 days supply | Qty: 56 | Fill #6

## 2023-12-27 NOTE — Telephone Encounter (Signed)
 Pharmacy Patient Advocate Encounter   Received notification from CoverMyMeds that prior authorization for Estradiol  0.1MG /GM cream is required/requested.   Insurance verification completed.   The patient is insured through [facility] MEDICAID .   Per test claim:  Brand Premarin  is preferred by the insurance.  If suggested medication is appropriate, Please send in a new RX and discontinue this one. If not, please advise as to why it's not appropriate so that we may request a Prior Authorization. Please note, some preferred medications may still require a PA.  If the suggested medications have not been trialed and there are no contraindications to their use, the PA will not be submitted, as it will not be approved.

## 2023-12-27 NOTE — Telephone Encounter (Signed)
Medication changed due to insurance

## 2023-12-27 NOTE — Addendum Note (Signed)
 Addended by: MELVIN PAO on: 12/27/2023 02:22 PM   Modules accepted: Orders

## 2024-01-25 MED FILL — XIFAXAN 550 MG TABLET: ORAL | 28 days supply | Qty: 56 | Fill #7

## 2024-01-26 ENCOUNTER — Inpatient Hospital Stay: Admit: 2024-01-26 | Discharge: 2024-01-26 | Payer: Medicaid (Managed Care)

## 2024-01-26 ENCOUNTER — Ambulatory Visit: Admit: 2024-01-26 | Discharge: 2024-01-26 | Payer: Medicaid (Managed Care)

## 2024-01-26 DIAGNOSIS — C220 Liver cell carcinoma: Principal | ICD-10-CM

## 2024-01-26 DIAGNOSIS — R160 Hepatomegaly, not elsewhere classified: Principal | ICD-10-CM

## 2024-01-26 DIAGNOSIS — C799 Secondary malignant neoplasm of unspecified site: Secondary | ICD-10-CM | POA: Diagnosis not present

## 2024-01-26 LAB — COMPREHENSIVE METABOLIC PANEL
ALBUMIN: 3.7 g/dL (ref 3.4–5.0)
ALKALINE PHOSPHATASE: 127 U/L — ABNORMAL HIGH (ref 46–116)
ALT (SGPT): 21 U/L (ref 10–49)
ANION GAP: 6 mmol/L (ref 5–14)
AST (SGOT): 24 U/L (ref <=34)
BILIRUBIN TOTAL: 0.3 mg/dL (ref 0.3–1.2)
BLOOD UREA NITROGEN: 18 mg/dL (ref 9–23)
BUN / CREAT RATIO: 23
CALCIUM: 9 mg/dL (ref 8.7–10.4)
CHLORIDE: 108 mmol/L — ABNORMAL HIGH (ref 98–107)
CO2: 27 mmol/L (ref 20.0–31.0)
CREATININE: 0.8 mg/dL (ref 0.55–1.02)
EGFR CKD-EPI (2021) FEMALE: 83 mL/min/1.73m2 (ref >=60)
GLUCOSE RANDOM: 131 mg/dL (ref 70–179)
POTASSIUM: 4.3 mmol/L (ref 3.5–5.1)
PROTEIN TOTAL: 7.5 g/dL (ref 5.7–8.2)
SODIUM: 141 mmol/L (ref 135–145)

## 2024-01-26 LAB — AFP TUMOR MARKER: AFP-TUMOR MARKER: 34 ng/mL — ABNORMAL HIGH (ref <=8)

## 2024-01-26 LAB — CBC
HEMATOCRIT: 39.6 % (ref 34.0–44.0)
HEMOGLOBIN: 13.7 g/dL (ref 11.3–14.9)
MEAN CORPUSCULAR HEMOGLOBIN CONC: 34.6 g/dL (ref 32.0–36.0)
MEAN CORPUSCULAR HEMOGLOBIN: 32.6 pg — ABNORMAL HIGH (ref 25.9–32.4)
MEAN CORPUSCULAR VOLUME: 94.1 fL (ref 77.6–95.7)
MEAN PLATELET VOLUME: 7.9 fL (ref 6.8–10.7)
PLATELET COUNT: 264 10*9/L (ref 150–450)
RED BLOOD CELL COUNT: 4.21 10*12/L (ref 3.95–5.13)
RED CELL DISTRIBUTION WIDTH: 12.6 % (ref 12.2–15.2)
WBC ADJUSTED: 8.4 10*9/L (ref 3.6–11.2)

## 2024-01-26 LAB — PROTIME-INR
INR: 1.01
PROTIME: 11.5 s (ref 9.9–12.6)

## 2024-01-26 MED ADMIN — gadopiclenol (ELUCIREM,VUEWAY) injection 5.5 mL: 5.5 mL | INTRAVENOUS | @ 17:00:00 | Stop: 2024-01-26

## 2024-01-26 NOTE — Unmapped (Signed)
 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 Today's scan shows progression with infiltrative mass, TIV and nodal metastases. While this is likely recurrent HCC, the lesion appears atypical and warrants a biopsy for definitive diagnosis. CT chest showed a non-specific 0.5 cm nodule, possibly related to a pulmonary metastasis. I have spoken to [facility] who previously saw patient and agrees to see her in follow up.  - Order liver lesion biopsy to confirm diagnosis.  - Coordinate biopsy scheduling.  - Repeat CT chest  - Reestablish in medical oncology for consideration of systemic therapy.  - Schedule EGD for evaluation and management of varices.    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 Reports ongoing mild confusion which could be related to anxiety. Has not tolerated lactulose  in past.  - Continue rifaximin .  - Schedule EGD for variceal surveillance (prior non-bleeding varices)     Medical decision making high based on complexity of cancer, interpretation of cancer response, and treatment risk of complications and monitoring toxicity of cancer treatment.      Prentice Feeling, MD, MPH  Assistant Professor of Medicine  [facility]  [facility]  Division of Gastroenterology and Hepatology      Subjective   History of Present Illness   Accompanied by: sister    History of Present Illness  63 y.o. 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    She has a history of BCLC-A hepatocellular carcinoma with multifocal lesions within [place] criteria. She underwent transarterial chemoembolization (TACE) in February 2023 for a recurrent Lyrax 5 lesion and a repeat TACE in September 2023 for a Lyrax M lesion. She is currently being evaluated for a new liver lesion.    She experiences confusion, which is managed with rifaximin  for hepatic encephalopathy. No vomiting, hematemesis, or melena.    She is managing her diet by reducing soda intake and has lost five pounds. She is also trying to reduce salt and sugar intake.    There is a family history of pancreatic cancer, as her mother passed away from it. She has expressed concern about her pancreas due to a previous scheduling error that listed a pancreas-related appointment, causing her significant anxiety.    She is currently taking buspirone, which was recently increased, and has started Chantix. She reports running out of Chantix but found it effective when she was taking it.     =============================================================    FHSI-8 (Ver. 4)  -----------------------------------------------------------------------------------------------------------    I have a lack of energy      2 (Somewhat)    I have nausea       0 (Not at all)    I have pain        3 (Quite a bit)    I am losing weight       1 (A little bit)    I have pain in my back      0 (Not at all)    I feel fatigued       0 (Not at all)  I am bothered by jaundice or yellow color to my skin  0 (Not at all)    I have discomfort or pain in my stomach area   0 (Not at all)  =============================================================    Objective   Physical Exam   Vital Signs: BP 152/70  - Pulse 80  - Temp 36.1 ??C (97 ??F) (Temporal)  - Resp 18  - Ht 162.6 cm (5' 4)  - Wt 55.2 kg (121 lb 12.8 oz)  - LMP  (LMP Unknown)  - SpO2 99%  - BMI 20.91 kg/m??   Constitutional: She is in no apparent distress  Eyes: Anicteric sclerae  Cardiovascular: No peripheral edema  Gastrointestinal: Soft, nontender abdomen without hepatosplenomegaly, hernias, or masses  Neurologic: Awake, alert, and oriented to person, place, and time with normal speech and no asterixis

## 2024-01-29 NOTE — Unmapped (Signed)
 [facility] VIR Biopsy Request Information Sheet     Referring Provider:  Dr. Prentice Feeling  Date of Review:  01/29/2024    Reviewing Provider:  Gerard Levorn Gaskins, MD     Requested Biopsy Site:  segment 5/6 lesion    Reason for Request:    63 year old female with a hx of HCC s/p TACE x 2 of segment 7 lesion, most recently 03/07/2022 now with enlarging lesion in segment 5/6, interpreted as LR-TIV on MRI from 01/26/2024, however on previous Mrs it was characterized as LR-M. Given atypical evolution of the lesion and aggressive appearance, oncology is requesting tissue sampling for further characterization.     Past Medical History:  Past Medical History[1]    Imaging reviewed:  I reviewed all pertinent diagnostic studies, including:  MRI 01/26/2024    Segment 5/6 lesion measuring 7.4 x 3.8 x 4.9 cm - LR-TIV  Discussed with Dr. Feeling, who confirmed that oncology is requesting tissue sampling due to atypical progression and aggressive appearance.     Recommended Imaging Modality to Perform Biopsy:  Ultrasound guided focal liver biopsy with moderate sedation, main hospital     Comments for Biopsy:  not at [facility] due to high risk of bleeding    Comments from ordering provider:         [1]   Past Medical History:  Diagnosis Date    COPD (chronic obstructive pulmonary disease)        HCV (hepatitis C virus)     HTN (hypertension)     MDD (major depressive disorder)

## 2024-01-29 NOTE — Unmapped (Signed)
 EGD  Procedure #1     Procedure #2   [id number]  MRN   [employer]     Is the patient's health insurance [employer], [employer], [facility]), UHC Med [place], [employer], or Cigna?     Urgent procedure     Are you pregnant?     Are you in the process of scheduling or awaiting results of a heart ultrasound, stress test, or catheterization to evaluate new or worsening chest pain, dizziness, or shortness of breath?     Do you take: Plavix (clopidogrel), Coumadin (warfarin), Lovenox (enoxaparin), Pradaxa (dabigatran), Effient (prasugrel), Xarelto (rivaroxaban), Eliquis (apixaban), Pletal (cilostazol), or Brilinta (ticagrelor)?          Did ordering provider indicate how long to hold this medication in the order comments?          Which of the above medications are you taking?          What is the name of the medical practice that manages this medication?          What is the name of the medical provider who manages this medication?     Do you have hemophilia, von Willebrand disease, or low platelets?     Do you have a pacemaker or implanted cardiac defibrillator?   TRUE  Has a [place] [facility] provider specified the location(s)?     Which location(s) did the [facility] provider specify?   TRUE       [facility]          [place]          [facility]   TRUE  Do you see a liver specialist for chronic liver disease?   TRUE  Is the procedure indication for variceal screening?     Is procedure indication for variceal banding (this does NOT include variceal screening)?     Have you had a heart attack, stroke or heart stent placement within the past 6 months?     Month of event     Year of event (ONLY ENTER LAST 2 DIGITS)        5  Height (feet)   4  Height (inches)   121  Weight (pounds)   20.8  BMI          Did the ordering provider specify a bowel prep?          What bowel prep was specified?     Do you have an ostomy (bag on your stomach that collects your stool)?          Is it an ileostomy?          Is it a colostomy?          Patient doesn't know.     Do you have chronic kidney disease?     Do you have chronic constipation or have you had poor quality bowel preps for past colonoscopies?     Do you have Crohn's disease or ulcerative colitis?     Have you had weight loss surgery?        TRUE  When you walk around your house or grocery store, do you have to stop and rest due to shortness of breath, chest pain, or light-headedness?     Do you ever use supplemental oxygen?     Have you been hospitalized for cirrhosis of the liver or heart failure in the last 12 months?     Have you been treated for mouth or throat  cancer with radiation or surgery?     Have you been told that it is difficult for doctors to insert a breathing tube in you during anesthesia?     Have you had a heart or lung transplant?          Are you on dialysis?     Do you have cirrhosis of the liver?     Do you have myasthenia gravis?     Is the patient a prisoner?   ################# ## ###################################################################################################################   MRN:  [id number]   Anticoag Review  No   Nurse Triage  No   GI clinic consult  No   Procedure(s):  EGD     0   Endoscopist:  Moon   Urgent:  No   Prep:                    --------------------------- --- ----------------------------------------------------------------------------------------------------------------------------------------------------------------------------   [facility]                  Requested Locations: [facility]             ################# ## ###################################################################################################################

## 2024-01-30 NOTE — Unmapped (Signed)
 01/30/24 OP APPROVED US  MAIN focal Liver Bx by Gretta (MD NOTE in EPIC-US  guided focal liver biopsy at main hospital with moderate sedation. This should NOT be done at [facility] due to elevated bleeding risk) 01/29/24(PD) // on 01/30/24 scheduled Bx w pt per preference-pt wanted to schedule when daughter is here from [place] . Confirmed No Anti-Coag's, discussed pre procedure instructions, confirmed family as driver, confirmed [employer] Ins.(SG)

## 2024-01-31 NOTE — Unmapped (Signed)
 Copied from CRM [id number]. Topic: Care Management - Discuss Care Plan  >> Jan 31, 2024 10:58 AM Jon RAMAN wrote:      Erskin Nest contacted the Communication Center requesting to speak with the care team of Robin Velez to discuss:    Nest called requesting to speak with care team    Please contact Nest at [phone].    Thank you,   Jon JONELLE Shoulder  [facility]   [phone]

## 2024-02-08 NOTE — Unmapped (Signed)
 Patient Robin Velez was called in attempt number 1 to reach them regarding scheduling their upcoming appointments on 05/10/24.     The call resulted in: Message left    Please schedule the patient upon their return call on 11/4 for the following: CT scan at Main for 7:30am or at [facility] for 11am. The patient has appointments on 11/14 at Main starting at 12:30pm.    Same day scan(s) orders in    Thank you,    Dena ONEIDA Dage

## 2024-02-14 NOTE — Unmapped (Signed)
 VIR pre liver biopsy prep call completed with daughter Leotis. Reviewed to register at 0800 on ground floor of Surgical hospital then proceed [facility].  Informed of no show/late cancellation policy. NPO guidelines reviewed. Pt OK to take sips of clear liquids with all AM meds.  Pt aware of need for driver >64 years of age able to stay throughout procedure and recovery. Made aware of visitation policy.  Pt verbalized understanding. All questions answered.     Last BC powder prior to 8/19.  Knows to hold starting now.

## 2024-02-16 ENCOUNTER — Other Ambulatory Visit: Payer: Self-pay | Admitting: Nurse Practitioner

## 2024-02-19 NOTE — Unmapped (Signed)
 02/19/24 OP APPROVED US  MAIN focal Liver Bx by Gretta (MD NOTE in EPIC-US  guided focal liver biopsy at main hospital with moderate sedation. This should NOT be done at [facility] due to elevated bleeding risk) 01/29/24(PD) // on 02/19/24 rescheduled Bx w daughter Leotis for 1st AVAILABLE, had to [place] 8/25 due to transportation issues. Confirmed No Anti-Coag's, discussed pre procedure instructions, confirmed family as driver, confirmed [employer].(SG)

## 2024-02-19 NOTE — Telephone Encounter (Signed)
 Requested Prescriptions  Pending Prescriptions Disp Refills   potassium chloride  SA (KLOR-CON  M) 20 MEQ tablet [Pharmacy Med Name: POTASSIUM CHLORIDE  ER ER TABLET ER] 90 tablet 0    Sig: TAKE ONE TABLET BY MOUTH ONCE A DAY     Endocrinology:  Minerals - Potassium Supplementation Passed - 02/19/2024  9:23 AM      Passed - K in normal range and within 360 days    Potassium  Date Value Ref Range Status  04/17/2023 4.1 3.5 - 5.2 mmol/L Final         Passed - Cr in normal range and within 360 days    Creatinine, Ser  Date Value Ref Range Status  04/17/2023 0.85 0.57 - 1.00 mg/dL Final         Passed - Valid encounter within last 12 months    Recent Outpatient Visits           2 months ago Bipolar 1 disorder [facility])   [place] [facility] Melvin Pao, NP   3 months ago Bipolar 1 disorder [facility])   [place] [facility] Melvin Pao, NP   4 months ago Bipolar 1 disorder [facility])   [place] [facility] Melvin Pao, NP   4 months ago Acute low back pain without sciatica, unspecified back pain laterality   [place] [facility] Melvin Pao, NP

## 2024-02-20 ENCOUNTER — Inpatient Hospital Stay: Admit: 2024-02-20 | Discharge: 2024-02-20 | Payer: Medicaid (Managed Care)

## 2024-02-20 NOTE — Unmapped (Signed)
 VIR pre liver biopsy prep call completed with daughter Leotis. Reviewed to register at 0800 on ground floor of Surgical hospital then proceed [facility].  Informed of no show/late cancellation policy. NPO guidelines reviewed. Pt OK to take sips of clear liquids with all AM meds.  Pt aware of need for driver >64 years of age able to stay throughout procedure and recovery. Made aware of visitation policy.  Pt verbalized understanding. All questions answered.     Last [facility] 02/13/2024.

## 2024-02-20 NOTE — Unmapped (Signed)
 Patient Robin Velez was called in attempt number 2 to reach them regarding scheduling their upcoming appointments on 03/29/24.     The call resulted in: Message left    Please schedule the patient upon their return call on 03/29/24 for the following: CT scans at [facility]    Same day scan(s) orders in    Thank you,    Dena ONEIDA Dage

## 2024-02-22 ENCOUNTER — Inpatient Hospital Stay: Admit: 2024-02-22 | Discharge: 2024-02-22 | Payer: Medicaid (Managed Care)

## 2024-02-22 ENCOUNTER — Encounter
Admit: 2024-02-22 | Discharge: 2024-02-22 | Payer: Medicaid (Managed Care) | Attending: Anesthesiology | Primary: Anesthesiology

## 2024-02-22 DIAGNOSIS — I10 Essential (primary) hypertension: Secondary | ICD-10-CM | POA: Diagnosis not present

## 2024-02-22 DIAGNOSIS — K766 Portal hypertension: Secondary | ICD-10-CM | POA: Diagnosis not present

## 2024-02-22 DIAGNOSIS — J449 Chronic obstructive pulmonary disease, unspecified: Secondary | ICD-10-CM | POA: Diagnosis not present

## 2024-02-22 DIAGNOSIS — K922 Gastrointestinal hemorrhage, unspecified: Secondary | ICD-10-CM | POA: Diagnosis not present

## 2024-02-22 MED ADMIN — Propofol (DIPRIVAN) injection: INTRAVENOUS | @ 12:00:00 | Stop: 2024-02-22

## 2024-02-22 MED ADMIN — lactated Ringers infusion: 10 mL/h | INTRAVENOUS | @ 12:00:00 | Stop: 2024-02-22

## 2024-02-22 MED ADMIN — lactated Ringers infusion: INTRAVENOUS | @ 12:00:00 | Stop: 2024-02-22

## 2024-02-23 ENCOUNTER — Inpatient Hospital Stay: Admit: 2024-02-23 | Discharge: 2024-02-23 | Payer: Medicaid (Managed Care)

## 2024-02-23 DIAGNOSIS — R160 Hepatomegaly, not elsewhere classified: Principal | ICD-10-CM

## 2024-02-23 DIAGNOSIS — K769 Liver disease, unspecified: Secondary | ICD-10-CM | POA: Diagnosis not present

## 2024-02-23 MED ADMIN — fentaNYL (PF) (SUBLIMAZE) injection: INTRAVENOUS | @ 16:00:00 | Stop: 2024-02-23

## 2024-02-23 MED ADMIN — midazolam (VERSED) injection: INTRAVENOUS | @ 16:00:00 | Stop: 2024-02-23

## 2024-02-23 MED ADMIN — lidocaine (PF) (XYLOCAINE-MPF) 10 mg/mL (1 %) injection: @ 16:00:00 | Stop: 2024-02-23

## 2024-02-23 NOTE — Unmapped (Signed)
 The following criteria has been met per discharge order:    1. Vital signs completed and stable.  2. No respiratory difficulty.    3. No bleeding or hematoma at procedure site.   4. When patient is ambulatory, if bed rest is applicable.  5. When Aldrete score criteria has been met.   6. Significant pain is absent.   7. An escort is available.   8. Postsurgical care have been reviewed with the patient and escort. Understanding was verbalized.  9. Written postoperative instructions have been provided.   10. Follow-up appointment has been scheduled, if applicable.  11. PIV removed and site is benign.

## 2024-02-23 NOTE — Unmapped (Signed)
 [place] INTERVENTIONAL RADIOLOGY - Pre Procedure H/P  Patient name: Robin Velez  CSN: [id number]  MRN: [id number]  Date of Procedure: @TODAY @      Assessment/Plan:    Robin Velez is a 63 y.o. female who will undergo target liver biopsy in Interventional Radiology.    Consent obtained in the Pre Op holding area by Dr. Adin.  Risks, benefits, and alternatives including but not limited to bleeding, infection, and damage to surrounding structures were discussed with patient/patient's representative. All questions were answered to patient/patient's representative satisfaction.  Patient/Patient's representative consents and would like to proceed with the procedure.   --The patient will accept blood products in an emergent situation.  --The patient does not have a Do Not Resuscitate order in effect.      HPI: Robin Velez is a 63 y.o. female who presents to VIR for targeted liver biopsy with moderate sedation.    Past Medical History[1]    Past Surgical History[2]     Allergies: Allergies[3]    Medications:  No relevant medications, please see full medication list in Epic.    ASA Grade: ASA 2 - Patient with mild systemic disease with no functional limitations    PE:    Vitals:    02/23/24 0854   BP: 116/65   Pulse: 63   Resp: 16   Temp: 36.9 ??C (98.5 ??F)   SpO2: 95%     General: female in NAD.  Airway assessment: Class 3 - Can visualize soft palate  Lungs: Respirations nonlabored              [1]   Past Medical History:  Diagnosis Date    COPD (chronic obstructive pulmonary disease)         HCV (hepatitis C virus)     HTN (hypertension)     MDD (major depressive disorder)    [2]   Past Surgical History:  Procedure Laterality Date    IR EMBOLIZATION ORGAN ISCHEMIA, TUMORS, INFAR  08/06/2021    IR EMBOLIZATION ORGAN ISCHEMIA, TUMORS, INFAR 08/06/2021 Robin Donnice Morris, MD IMG [facility]    IR EMBOLIZATION ORGAN ISCHEMIA, TUMORS, INFAR  03/07/2022    IR EMBOLIZATION ORGAN ISCHEMIA, TUMORS, INFAR 03/07/2022 Robin Donnice Morris, MD IMG VIR H&V [facility]    PR UPPER GI ENDOSCOPY,DIAGNOSIS N/A 02/22/2024    Procedure: UGI ENDO, INCLUDE ESOPHAGUS, STOMACH, & DUODENUM &/OR JEJUNUM; DX W/WO COLLECTION SPECIMN, BY BRUSH OR WASH;  Surgeon: Erick Prentice CHRISTELLA, MD;  [facility];  Service: Gastroenterology   [3]   Allergies  Allergen Reactions    Venom-Honey Bee Anaphylaxis    Other Other (See Comments)     Runny nose and itching eyes  SEASONAL ALLEGIES  Runny nose and itching eyes  SEASONAL ALLEGIES      Lisinopril Anxiety

## 2024-02-23 NOTE — Unmapped (Signed)
 VIR Post-Procedure Note    Procedure Name: Targeted liver lesion biopsy    Pre-Op Diagnosis & indication: Seg 6 liver lesion    Post-Op Diagnosis: Same as pre-operative diagnosis    VIR Attending: Nat DELENA Knee, MD, MD    VIR Fellow/Resident/PA/NP: None    Time out: Prior to the procedure, a time out was performed with all team members present. During the time out, the patient, procedure and procedure site when applicable were verbally verified by the team members and Dr. Nat DELENA Knee, MD.    Description of procedure: Successful targeted seg 6 liver lesion biopsy x4 with no complication.    Plan: Samples sent to pathology, 2 samples sent for research    Sedation:Moderate sedation    Estimated Blood Loss: Minimal  Complications: None    See detailed procedure note with images in PACS [facility]).    The patient tolerated the procedure well without incident or complication.    Interventional Radiologist: Nat DELENA Knee, MD, MD  Date/Time: 02/23/2024 12:53 PM

## 2024-02-24 ENCOUNTER — Other Ambulatory Visit: Payer: Self-pay | Admitting: Nurse Practitioner

## 2024-02-24 NOTE — Unmapped (Signed)
 VIR post call placed by VIR . Did not reach patient. VIR RN left message stating calling from VIR @ [facility] to check up on patient. Also left instructions to contact VIR if procedure related needs arise.

## 2024-02-27 NOTE — Telephone Encounter (Signed)
 Requested Prescriptions  Pending Prescriptions Disp Refills   busPIRone  (BUSPAR ) 30 MG tablet [Pharmacy Med Name: BUSPIRONE  HYDROCHLORIDE 30MG  TABLET] 180 tablet 0    Sig: TAKE ONE TABLET (30 MG TOTAL) BY MOUTH TWO (TWO) TIMES DAILY.     Psychiatry: Anxiolytics/Hypnotics - Non-controlled Passed - 02/27/2024 10:31 AM      Passed - Valid encounter within last 12 months    Recent Outpatient Visits           2 months ago Bipolar 1 disorder [facility])   [place] [facility] Melvin Pao, NP   3 months ago Bipolar 1 disorder [facility])   [place] [facility] Melvin Pao, NP   4 months ago Bipolar 1 disorder [facility])   [place] [facility] Melvin Pao, NP   5 months ago Acute low back pain without sciatica, unspecified back pain laterality   [place] [facility] Melvin Pao, NP

## 2024-02-29 NOTE — Unmapped (Signed)
 I spoke with patient Robin Velez to confirm appointments on the following date(s): 10/3 CT scan    Robin Velez

## 2024-03-05 NOTE — Unmapped (Signed)
 [facility]     ASSESSMENT  Robin Velez is a 63 year old lady with HCV/EtOH ESLD with multifocal HCC.    COURSE/PLAN  11/25/20: US  for known HCV/EtOH ESLD-> 2.4cm liver lesions  03/22/21: 3 LIRADS5 1.4-2cm on MRI; AFP 300s  06/26/21: LR5 seg 6 lesion, LR4/3 lesion seg 4, 6, 1  07/02/21: discussed LRT/TACE/TARE-> future systemic therapy  08/06/21: TACE; 03/07/22: lap ablation  08/14/21-01/26/24: followed surveillance with Dr. Erick  02/23/24: liver carcinoma on bx; no varices on EGD; BCLC-A->met ot lymph node  03/06/24: discussed coherus 1st line study; pt wanting standard atezo/bev locally  RTC 3 months with scans and plan for atezo/bev at [facility]    INTERIM HISTORY: Patient reports doing well and denies additional symptoms at this time.    REVIEW OF SYSTEMS The remainder of a comprehensive 10 systems review is negative.  PMH/PSH: as per HPI; HCV, COPD, MDD, HTN  SH: +40py smoker, 14 cans/beer/wk; Lives in [place], [place]  FH: Noncontributory  Meds/allergies reviewed as per chart    PHYSICAL EXAM  VS reviewed in EMR; PS 1  GENERAL: well developed, well nourished, in no distress, good insight and judgement  HEENT: NCAT, pupils equal, sclerae anicteric, OP clear without erythema, no cervical adenopathy  PULM: normal resp rate, clear bilaterally without wheezes, rhonchi, rales  COR: regular without murmur, rub, gallop  GI: abdomen soft, nontender, nondistended, no palpable hepatosplenomegaly   EXT: warm and well perfused, no edema. No rashes  LABS, PATHOLOGY, RADIOLOGY reviewed

## 2024-03-06 ENCOUNTER — Ambulatory Visit: Admit: 2024-03-06 | Discharge: 2024-03-07 | Payer: Medicaid (Managed Care)

## 2024-03-06 DIAGNOSIS — C220 Liver cell carcinoma: Principal | ICD-10-CM

## 2024-03-06 MED ADMIN — flu vaccine TS 2025-26 (Fluarix,Flulaval,Fluzone) (6mos up)(PF) 45 mcg(15mcgx3)/0.5 mL IM syringe: .5 mL | INTRAMUSCULAR | @ 13:00:00 | Stop: 2024-03-06

## 2024-03-06 NOTE — Unmapped (Signed)
 Referral and records sent, confirmation received.  [facility]  Phone: [phone]  Fax: [phone]

## 2024-03-07 ENCOUNTER — Telehealth: Payer: Self-pay

## 2024-03-07 NOTE — Telephone Encounter (Signed)
-----   Message from [place] S sent at 03/07/2024 12:41 PM EDT ----- Regarding: RE: REFERRAL [facility] Thank you! Spoke with patient and she is agreeable to come in on Monday 9/15.   Monique Tucker ----- Message ----- From: Herold Almarie PARAS, RN Sent: 03/06/2024   4:27 PM EDT To: [name]; Almarie PARAS Herold, RN; Ro# Subject: RE: Monique Tucker [facility]           Discussed with Dr. Babara, Please schedule MD followed by labs asap for further discussion. ----- Message ----- From: Monique Tucker Sent: 03/06/2024   2:57 PM EDT To: [name]; Almarie PARAS Herold, RN; Jo# Subject: Monique Tucker [facility]               REFERRAL [facility] SENT TO her chart.  DR Babara this is an established patient of yours with new diagnosis of Hepatocellular Carcinoma. She wants to [facility] care here and get treatment closer to home. She receives Atezolizumab & Bevacizumab. You last saw her 05/26/2021. Please advise!

## 2024-03-11 ENCOUNTER — Inpatient Hospital Stay: Attending: Oncology | Admitting: Oncology

## 2024-03-11 ENCOUNTER — Inpatient Hospital Stay

## 2024-03-11 ENCOUNTER — Encounter: Payer: Self-pay | Admitting: Oncology

## 2024-03-11 VITALS — BP 154/76 | HR 94 | Temp 98.1°F | Resp 18 | Wt 123.8 lb

## 2024-03-11 DIAGNOSIS — C220 Liver cell carcinoma: Secondary | ICD-10-CM | POA: Insufficient documentation

## 2024-03-11 DIAGNOSIS — Z79899 Other long term (current) drug therapy: Secondary | ICD-10-CM | POA: Diagnosis not present

## 2024-03-11 DIAGNOSIS — K7682 Hepatic encephalopathy: Secondary | ICD-10-CM | POA: Diagnosis not present

## 2024-03-11 DIAGNOSIS — K7469 Other cirrhosis of liver: Secondary | ICD-10-CM | POA: Diagnosis not present

## 2024-03-11 DIAGNOSIS — Z5112 Encounter for antineoplastic immunotherapy: Secondary | ICD-10-CM | POA: Diagnosis present

## 2024-03-11 LAB — COMPREHENSIVE METABOLIC PANEL WITH GFR
ALT: 16 U/L (ref 0–44)
AST: 28 U/L (ref 15–41)
Albumin: 3.6 g/dL (ref 3.5–5.0)
Alkaline Phosphatase: 126 U/L (ref 38–126)
Anion gap: 10 (ref 5–15)
BUN: 10 mg/dL (ref 8–23)
CO2: 23 mmol/L (ref 22–32)
Calcium: 8.8 mg/dL — ABNORMAL LOW (ref 8.9–10.3)
Chloride: 102 mmol/L (ref 98–111)
Creatinine, Ser: 0.7 mg/dL (ref 0.44–1.00)
GFR, Estimated: >60 mL/min (ref >60)
Glucose, Bld: 147 mg/dL — ABNORMAL HIGH (ref 70–99)
Potassium: 3.8 mmol/L (ref 3.5–5.1)
Sodium: 135 mmol/L (ref 135–145)
Total Bilirubin: 0.3 mg/dL (ref 0.0–1.2)
Total Protein: 7.3 g/dL (ref 6.5–8.1)

## 2024-03-11 MED ORDER — ONDANSETRON HCL 8 MG PO TABS: 8.0000 mg | ORAL_TABLET | Freq: Three times a day (TID) | ORAL | 1 refills | Status: DC | PRN

## 2024-03-11 NOTE — Progress Notes (Signed)
 Hematology/Oncology Progress note [phone] [phone]         Patient Care Team: Melvin Pao, NP as PCP - General (Nurse Practitioner) Maurie Rayfield BIRCH, RN as Oncology Nurse Navigator Babara Call, MD as Consulting Physician (Oncology)  REFERRING PROVIDER: Melvin Pao, NP  CHIEF COMPLAINTS/REASON FOR VISIT:   multifocal HCC   ASSESSMENT & PLAN:   Hepatocellular carcinoma (HCC) Multifocal HCC, s/p local therapy.  Recent MRI and liver biopsy confirmed disease progression.  Recommend systemic treatment with Atezolizumab plus bevacizumab Recent EGD showed no varices Rationale and side effects were reviewed with patient.  She agrees with the plan.  Will arrange treatment class. Plan to start next week.   Hepatic encephalopathy (HCC) managed with rifaximin .  Has not tolerated lactulose in past.   Other cirrhosis of liver (HCC) Compensated.   Orders Placed This Encounter  Procedures   Consent Attestation for Oncology Treatment    The patient is informed of risks, benefits, side-effects of the prescribed oncology treatment. Potential short term and long term side effects and response rates discussed. After a long discussion, the patient made informed decision to proceed.:   Yes   CBC with Differential (Cancer Center Only)    Standing Status:   Future    Expected Date:   03/21/2024    Expiration Date:   03/21/2025   CMP (Cancer Center only)    Standing Status:   Future    Expected Date:   03/21/2024    Expiration Date:   03/21/2025   T4    Standing Status:   Future    Expected Date:   03/21/2024    Expiration Date:   03/21/2025   TSH    Standing Status:   Future    Expected Date:   03/21/2024    Expiration Date:   03/21/2025   Total Protein, Urine dipstick    Standing Status:   Future    Expected Date:   03/21/2024    Expiration Date:   03/21/2025   Comprehensive metabolic panel with GFR    Standing Status:   Future    Number of  Occurrences:   1    Expected Date:   03/11/2024    Expiration Date:   03/11/2025   AFP tumor marker    Standing Status:   Future    Number of Occurrences:   1    Expected Date:   03/11/2024    Expiration Date:   03/11/2025   [facility] PHYSICIAN COMMUNICATION 1    TSH and T4 baseline and every 3rd cycle   PHYSICIAN COMMUNICATION ORDER    UA Protein, dipstick required prior to every 3rd cycle bevacizumab    All questions were answered. The patient knows to call the clinic with any problems, questions or concerns.  Call Babara, MD, PhD [facility] Hematology Oncology 03/11/2024    HISTORY OF PRESENTING ILLNESS:   Monique Tucker is a  63 y.o.  female with PMH listed below was seen in consultation at the request of  Melvin Pao, NP  for evaluation of multifocal [facility]  Patient had ultrasounds done in June 2022 which showed features of liver cirrhosis and a scattered hypodense hepatic lesions largest measuring 2.4 cm. 03/22/2021, patient had a liver MRI done which showed a 3 hyperenhancing lesions of the liver.  Highly suspicious for HCC, LIRAD 5. Size of 3 lesions are 2.0 x 1.7 cm, 1.4 x 1.1 cm, 2.1 x 1.4 cm. Liver cirrhosis.  Patient was recommended by  primary care provider to establish care with gastroenterology. 04/30/2021, lab work showed elevated alkaline phosphatase of 132, AST and ALT are mildly elevated.  Normal bilirubin.  05/24/2021, patient was seen by gastroenterology Dr. Therisa. Additional blood work was obtained.  AFP was elevated in the 300s. Hepatitis C antibody is positive.  Negative antimicrosomal antibody, negative HIV, negative ANA, negative mitochondrial/smooth muscle cell antibody panel.  Ferritin was elevated at 510.  Normal iron saturation.  Elevated immunoglobulins 1689, celiac panel negative.  Alpha antitrypsin elevated 221.  Patient has a history of alcohol use.  14 cans of beer per week.  She has stopped alcohol use since her visits with gastroenterology.  Current  everyday smoker.  A pack a day.  She is working on smoke cessation Patient works at [employer]. She has anxiety and depression and she previously follows up with a psychiatrist.  She has not followed up with a psychiatrist recently.  Patient takes Wellbutrin, Effexor , Seroquel .  INTERVAL HISTORY Monique Tucker is a 63 y.o. female who has above history reviewed by me today presents for follow up visit for [facility]  Oncology care records  was reviewed.  June 22: US  for known HCV/EtOH ESLD-> 2.4cm liver lesions 03/22/21: 3 LIRADS5 1.4-2cm on MRI; AFP 300s 06/26/21: LR5 seg 6 lesion, LR4/3 lesion seg 4, 6, 1 07/02/21: discussed LRT/TACE/TARE-> future systemic therapy 08/06/21: TACE; 03/07/22: lap ablation 08/14/21-01/26/24: followed surveillance with Dr. Erick 01/26/2024 MRI abdomen w wo contrast   Interval prominent increase in size of segment 5 and 6, 7.4 x 3.8 x 4.9 cm Enlarged central necrotic lymph nodes in the portacaval region and aortocaval region. Treated lesion located in segment 6 and 7 is not visualized    02/23/24: liver carcinoma on bx; no varices on EGD; BCLC-A->met ot lymph node She was recommended to start first line systemic treatment with Atezolizumab plus bevacizumab. She prefers to reestablish care and get treatment locally.   Patient was accompanied by daughter. She denies abdominal pain.  Appetite is fair.  Review of Systems  Constitutional:  Negative for appetite change, chills, fatigue and fever.  HENT:   Negative for hearing loss and voice change.   Eyes:  Negative for eye problems.  Respiratory:  Negative for chest tightness and cough.   Cardiovascular:  Negative for chest pain.  Gastrointestinal:  Negative for abdominal distention, abdominal pain and blood in stool.  Endocrine: Negative for hot flashes.  Genitourinary:  Negative for difficulty urinating and frequency.   Musculoskeletal:  Negative for arthralgias.  Skin:  Negative for itching and rash.   Neurological:  Negative for extremity weakness.  Hematological:  Negative for adenopathy.  Psychiatric/Behavioral:  Positive for depression. Negative for confusion. The patient is nervous/anxious.     MEDICAL HISTORY:  Past Medical History:  Diagnosis Date   Acute hepatitis C    Anxiety    AR (allergic rhinitis)    Arthritis    Bulging lumbar disc    COPD (chronic obstructive pulmonary disease) (HCC)    mild   Depression    Hypertension    Plantar fasciitis    Restless leg syndrome, controlled    Sciatica    left    SURGICAL HISTORY: Past Surgical History:  Procedure Laterality Date   BLADDER REPAIR     CATARACT EXTRACTION W/PHACO Left 05/13/2020   Procedure: CATARACT EXTRACTION PHACO AND INTRAOCULAR LENS PLACEMENT (IOC) LEFT;  Surgeon: Mittie Gaskin, MD;  Location: [facility];  Service: Ophthalmology;  Laterality: Left;  5.15  049.8 10.4%   CATARACT EXTRACTION W/PHACO Right 05/27/2020   Procedure: CATARACT EXTRACTION PHACO AND INTRAOCULAR LENS PLACEMENT (IOC) RIGHT;  Surgeon: Mittie Gaskin, MD;  Location: [facility] OR[facility];  Service: Ophthalmology;  Laterality: Right;  3.29 0:55.2 6.0%   CYSTOCELE REPAIR N/A 10/19/2015   Procedure: ANTERIOR REPAIR (CYSTOCELE);  Surgeon: Gladis DELENA Dollar, MD;  Location: [facility] OR[facility];  Service: Gynecology;  Laterality: N/A;   ESOPHAGOGASTRODUODENOSCOPY (EGD) WITH PROPOFOL  N/A 05/25/2021   Procedure: ESOPHAGOGASTRODUODENOSCOPY (EGD) WITH PROPOFOL ;  Surgeon: Therisa Bi, MD;  Location: [facility];  Service: Gastroenterology;  Laterality: N/A;   EYE SURGERY     RECTOCELE REPAIR N/A 05/02/2016   Procedure: POSTERIOR REPAIR (RECTOCELE);  Surgeon: Gladis DELENA Dollar, MD;  Location: [facility];  Service: Gynecology;  Laterality: N/A;   TOTAL VAGINAL HYSTERECTOMY     TUBAL LIGATION     VAGINAL HYSTERECTOMY Bilateral 10/19/2015   Procedure: HYSTERECTOMY VAGINAL WITH BILATERAL SALPINGO-OOPHERECTOMY;  Surgeon: Gladis DELENA Dollar,  MD;  Location: [facility] ORS;  Service: Gynecology;  Laterality: Bilateral;    SOCIAL HISTORY: Social History   Socioeconomic History   Marital status: Single    Spouse name: Not on file   Number of children: Not on file   Years of education: Not on file   Highest education level: 12th grade  Occupational History   Not on file  Tobacco Use   Smoking status: Every Day    Current packs/day: 1.00    Types: Cigarettes   Smokeless tobacco: Never   Tobacco comments:     started age 27  Vaping Use   Vaping status: Every Day  Substance and Sexual Activity   Alcohol use: Not Currently    Comment:     Drug use: No   Sexual activity: Not Currently  Other Topics Concern   Not on file  Social History Narrative   Not on file   Social Drivers of Health   Financial Resource Strain: Medium Risk (09/26/2023)   Overall Financial Resource Strain (CARDIA)    Difficulty of Paying Living Expenses: Somewhat hard  Food Insecurity: Food Insecurity Present (09/26/2023)   Hunger Vital Sign    Worried About Running Out of Food in the Last Year: Sometimes true    Ran Out of Food in the Last Year: Never true  Transportation Needs: Unmet Transportation Needs (09/26/2023)   PRAPARE - Transportation    Lack of Transportation (Medical): Yes    Lack of Transportation (Non-Medical): Yes  Physical Activity: Unknown (09/26/2023)   Exercise Vital Sign    Days of Exercise per Week: 0 days    Minutes of Exercise per Session: Not on file  Stress: Stress Concern Present (09/26/2023)   [employer] of Occupational Health - Occupational Stress Questionnaire    Feeling of Stress : To some extent  Social Connections: Socially Isolated (09/26/2023)   Social Connection and Isolation Panel    Frequency of Communication with Friends and Family: More than three times a week    Frequency of Social Gatherings with Friends and Family: Never    Attends Religious Services: Never    Database administrator or Organizations: No     Attends Engineer, structural: Not on file    Marital Status: Divorced  Intimate Partner Violence: Not At Risk (02/22/2024)   Received from [facility]   Humiliation, Afraid, Rape, and Kick questionnaire    Within the last year, have you been afraid of your partner or ex-partner?: No    Within the  last year, have you been humiliated or emotionally abused in other ways by your partner or ex-partner?: No    Within the last year, have you been kicked, hit, slapped, or otherwise physically hurt by your partner or ex-partner?: No    Within the last year, have you been raped or forced to have any kind of sexual activity by your partner or ex-partner?: No    FAMILY HISTORY: Family History  Problem Relation Age of Onset   Breast cancer Mother    Pancreatic cancer Mother    Cancer Mother        breast and pancreatic   Hypertension Mother    Migraines Mother    Diabetes Mother    Heart disease Father    Mental illness Sister        depression   Heart attack Sister    Diabetes Sister    Alcohol abuse Maternal Grandfather    Cancer Maternal Grandmother        lung and brain   Mental illness Sister        depression    ALLERGIES:  is allergic to bee venom, hydralazine , other, and lisinopril .  MEDICATIONS:  Current Outpatient Medications  Medication Sig Dispense Refill   albuterol  (VENTOLIN  HFA) 108 (90 Base) MCG/ACT inhaler Inhale 2 puffs into the lungs every 6 (six) hours as needed for wheezing or shortness of breath. 18 g 3   amLODipine  (NORVASC ) 2.5 MG tablet TAKE ONE TABLET BY MOUTH ONCE A DAY. TO TAKE IN CONJUNCTION WITH FIVE MG TABLET TO = A DOSE OF 7.5 MG 90 tablet 1   amLODipine  (NORVASC ) 5 MG tablet TAKE ONE TABLET BY MOUTH ONCE A DAY. TAKE WITH THE 2.5 MG TABLET TO EQUAL A TOTAL DOSAGE OF 7.5 MG 90 tablet 1   busPIRone  (BUSPAR ) 30 MG tablet TAKE ONE TABLET (30 MG TOTAL) BY MOUTH TWO (TWO) TIMES DAILY. 180 tablet 0   cetirizine  (ZYRTEC  ALLERGY) 10 MG tablet Take 1  tablet (10 mg total) by mouth daily. 90 tablet 1   conjugated estrogens  (PREMARIN ) vaginal cream Place 1 Applicatorful vaginally daily. 42.5 g 12   diclofenac  Sodium (VOLTAREN ) 1 % GEL Apply 2 g topically 4 (four) times daily. 50 g 0   EPINEPHrine  0.3 mg/0.3 mL IJ SOAJ injection INJECT 0.3 MLS INTO THE MUSCLE ONCE 2 each 1   fluticasone -salmeterol (ADVAIR DISKUS) 250-50 MCG/ACT AEPB Inhale 1 puff into the lungs in the morning and at bedtime. 60 each 2   gabapentin  (NEURONTIN ) 300 MG capsule TAKE ONE CAPSULE BY MOUTH THREE TIMES A DAY 270 capsule 1   losartan  (COZAAR ) 100 MG tablet TAKE ONE TABLET (100 MG TOTAL) BY MOUTH DAILY. 90 tablet 1   Magnesium  500 MG TABS Take 500 mg by mouth daily.     methocarbamol  (ROBAXIN ) 500 MG tablet TAKE ONE TABLET (500 MG TOTAL) BY MOUTH FOUR TIMES DAILY. 60 tablet 0   montelukast  (SINGULAIR ) 10 MG tablet TAKE ONE TABLET BY MOUTH AT BEDTIME 90 tablet 1   potassium chloride  SA (KLOR-CON  M) 20 MEQ tablet TAKE ONE TABLET BY MOUTH ONCE A DAY 90 tablet 0   propranolol (INDERAL) 10 MG tablet      QUEtiapine  (SEROQUEL  XR) 200 MG 24 hr tablet Take 1 tablet (200 mg total) by mouth at bedtime. 90 tablet 1   rifaximin  (XIFAXAN ) 550 MG TABS tablet Take 1 tablet (550 mg total) by mouth 2 (two) times daily.     Tiotropium Bromide  Monohydrate (SPIRIVA  RESPIMAT) 2.5  MCG/ACT AERS INHALE 1 PUFF BY MOUTH INTO THE LUNGS DAILY 4 g 11   venlafaxine  XR (EFFEXOR -XR) 150 MG 24 hr capsule Take 1 capsule (150 mg total) by mouth daily with breakfast. 90 capsule 0   methylPREDNISolone  (MEDROL  DOSEPAK) 4 MG TBPK tablet Take as directed (Patient not taking: Reported on 03/11/2024) 21 tablet 0   ondansetron  (ZOFRAN ) 8 MG tablet Take 1 tablet (8 mg total) by mouth every 8 (eight) hours as needed for nausea or vomiting. 30 tablet 1   OXcarbazepine  (TRILEPTAL ) 150 MG tablet Take 1 tablet (150 mg total) by mouth 2 (two) times daily. (Patient not taking: Reported on 03/11/2024) 90 tablet 1   Varenicline   Tartrate, Starter, (CHANTIX  STARTING MONTH PAK) 0.5 MG X 11 & 1 MG X 42 TBPK Take one 0.5 mg tablet by mouth once daily for 3 days, then increase to one 0.5 mg tablet twice daily for 4 days, then increase to one 1 mg tablet twice daily. (Patient not taking: Reported on 03/11/2024) 53 each 0   Current Facility-Administered Medications  Medication Dose Route Frequency Provider Last Rate Last Admin   betamethasone  acetate-betamethasone  sodium phosphate  (CELESTONE ) injection 3 mg  3 mg Intramuscular Once Evans, Brent M, DPM       betamethasone  acetate-betamethasone  sodium phosphate  (CELESTONE ) injection 3 mg  3 mg Intramuscular Once Janit Thresa HERO, DPM         PHYSICAL EXAMINATION: ECOG PERFORMANCE STATUS: 1 - Symptomatic but completely ambulatory Vitals:   03/11/24 1014 03/11/24 1019  BP: (!) 163/69 (!) 154/76  Pulse:  94  Resp: 18   Temp: 98.1 F (36.7 C)    Filed Weights   03/11/24 1014  Weight: 123 lb 12.8 oz (56.2 kg)    Physical Exam Constitutional:      General: She is not in acute distress. HENT:     Head: Normocephalic and atraumatic.  Eyes:     General: No scleral icterus. Cardiovascular:     Rate and Rhythm: Normal rate and regular rhythm.     Heart sounds: Normal heart sounds.  Pulmonary:     Effort: Pulmonary effort is normal. No respiratory distress.     Breath sounds: No wheezing.  Abdominal:     General: Bowel sounds are normal. There is no distension.     Palpations: Abdomen is soft.  Musculoskeletal:        General: No deformity. Normal range of motion.     Cervical back: Normal range of motion and neck supple.  Skin:    General: Skin is warm and dry.     Findings: No erythema or rash.  Neurological:     Mental Status: She is alert and oriented to person, place, and time. Mental status is at baseline.     Cranial Nerves: No cranial nerve deficit.     Coordination: Coordination normal.     LABORATORY DATA:  I have reviewed the data as listed Lab  Results  Component Value Date   WBC 4.7 07/18/2022   HGB 14.7 07/18/2022   HCT 43.5 07/18/2022   MCV 97 07/18/2022   PLT 175 07/18/2022   Recent Labs    04/17/23 1610 03/11/24 1107  NA 138 135  K 4.1 3.8  CL 102 102  CO2 20 23  GLUCOSE 184* 147*  BUN 13 10  CREATININE 0.85 0.70  CALCIUM 9.1 8.8*  GFRNONAA  --  >60  PROT 7.0 7.3  ALBUMIN 4.5 3.6  AST 28 28  ALT 22  16  ALKPHOS 126* 126  BILITOT 0.2 0.3   Iron/TIBC/Ferritin/ %Sat    Component Value Date/Time   IRON 62 05/24/2021 1344   TIBC 295 05/24/2021 1344   FERRITIN 510 (H) 05/24/2021 1344   IRONPCTSAT 21 05/24/2021 1344      RADIOGRAPHIC STUDIES: I have personally reviewed the radiological images as listed and agreed with the findings in the report. No results found.

## 2024-03-11 NOTE — Progress Notes (Signed)
 Patient here to re- establish care for [facility].

## 2024-03-11 NOTE — Assessment & Plan Note (Signed)
 Compensated

## 2024-03-11 NOTE — Assessment & Plan Note (Signed)
 managed with rifaximin .  Has not tolerated lactulose in past.

## 2024-03-11 NOTE — Progress Notes (Signed)
 START ON PATHWAY REGIMEN - Hepatobiliary     A cycle is every 21 days:     Atezolizumab      Bevacizumab-xxxx   **Always confirm dose/schedule in your pharmacy ordering system**  Patient Characteristics: Hepatocellular Carcinoma, Unresectable or Nonsurgical Candidate, Locally Advanced or Metastatic Disease Not Amenable to Locoregional Therapy, Systemic Therapy, First Line, No Prior Transplant and Candidate for Immunotherapy Hepatobiliary Disease Type: Hepatocellular Carcinoma Line of Therapy: First Line Intent of Therapy: Non-Curative / Palliative Intent, Discussed with Patient

## 2024-03-11 NOTE — Assessment & Plan Note (Signed)
 Multifocal HCC, s/p local therapy.  Recent MRI and liver biopsy confirmed disease progression.  Recommend systemic treatment with Atezolizumab plus bevacizumab Recent EGD showed no varices Rationale and side effects were reviewed with patient.  She agrees with the plan.  Will arrange treatment class. Plan to start next week.

## 2024-03-12 ENCOUNTER — Other Ambulatory Visit: Payer: Self-pay

## 2024-03-12 ENCOUNTER — Telehealth: Payer: Self-pay | Admitting: Oncology

## 2024-03-12 ENCOUNTER — Inpatient Hospital Stay

## 2024-03-12 LAB — AFP TUMOR MARKER: AFP, Serum, Tumor Marker: 26.6 ng/mL — ABNORMAL HIGH (ref 0.0–9.2)

## 2024-03-12 NOTE — Telephone Encounter (Signed)
 Per secure chat from [facility], pt did not show for patient education class.   I called pt and left a vm to call back to r/a appt.

## 2024-03-13 NOTE — Progress Notes (Signed)
 Pharmacist Chemotherapy Monitoring - Initial Assessment    Anticipated start date: 03/21/24   The following has been reviewed per standard work regarding the patient's treatment regimen: The patient's diagnosis, treatment plan and drug doses, and organ/hematologic function Lab orders and baseline tests specific to treatment regimen  The treatment plan start date, drug sequencing, and pre-medications Prior authorization status  Patient's documented medication list, including drug-drug interaction screen and prescriptions for anti-emetics and supportive care specific to the treatment regimen The drug concentrations, fluid compatibility, administration routes, and timing of the medications to be used The patient's access for treatment and lifetime cumulative dose history, if applicable  The patient's medication allergies and previous infusion related reactions, if applicable   Changes made to treatment plan:  N/A  Follow up needed:  N/A   Maudie FORBES Andreas, PharmD, BCPS Clinical Pharmacist   03/13/2024  3:58 PM

## 2024-03-14 ENCOUNTER — Encounter: Payer: Self-pay | Admitting: Oncology

## 2024-03-14 ENCOUNTER — Telehealth: Payer: Self-pay | Admitting: Oncology

## 2024-03-14 NOTE — Telephone Encounter (Signed)
 Pt had called nurse line to r.s her chemo education class.  I have called pt back and confirmed new date/time for education class.

## 2024-03-18 ENCOUNTER — Inpatient Hospital Stay

## 2024-03-18 NOTE — Progress Notes (Signed)
 [facility] CSW Progress Note   Clinical Social Work introduced self to patient during Patient Education with [name], Charity fundraiser.  Provided information regarding CSW role, including counseling, advanced care planning and support group.  Answered questions as needed.    Follow Up Plan:  CSW will follow-up with patient by phone     [name], LCSW Clinical Social Worker [facility]    Patient is participating in a Managed Medicaid Plan:  Yes

## 2024-03-20 DIAGNOSIS — K7682 Hepatic encephalopathy    (CMS-HCC): Principal | ICD-10-CM

## 2024-03-20 MED ORDER — XIFAXAN 550 MG TABLET
ORAL_TABLET | Freq: Two times a day (BID) | ORAL | 10 refills | 30.00000 days
Start: 2024-03-20 — End: 2025-02-13

## 2024-03-21 ENCOUNTER — Inpatient Hospital Stay

## 2024-03-21 ENCOUNTER — Other Ambulatory Visit: Payer: Self-pay

## 2024-03-21 ENCOUNTER — Telehealth: Payer: Self-pay

## 2024-03-21 ENCOUNTER — Encounter: Payer: Self-pay | Admitting: Oncology

## 2024-03-21 ENCOUNTER — Inpatient Hospital Stay (HOSPITAL_BASED_OUTPATIENT_CLINIC_OR_DEPARTMENT_OTHER): Admitting: Oncology

## 2024-03-21 VITALS — BP 124/69 | HR 74 | Temp 96.8°F | Resp 16 | Wt 121.3 lb

## 2024-03-21 VITALS — BP 126/55 | HR 67

## 2024-03-21 DIAGNOSIS — K7682 Hepatic encephalopathy    (CMS-HCC): Principal | ICD-10-CM

## 2024-03-21 DIAGNOSIS — K7469 Other cirrhosis of liver: Secondary | ICD-10-CM | POA: Diagnosis not present

## 2024-03-21 DIAGNOSIS — C220 Liver cell carcinoma: Secondary | ICD-10-CM

## 2024-03-21 DIAGNOSIS — R300 Dysuria: Secondary | ICD-10-CM | POA: Insufficient documentation

## 2024-03-21 DIAGNOSIS — Z5112 Encounter for antineoplastic immunotherapy: Secondary | ICD-10-CM | POA: Diagnosis not present

## 2024-03-21 DIAGNOSIS — Z5111 Encounter for antineoplastic chemotherapy: Secondary | ICD-10-CM | POA: Insufficient documentation

## 2024-03-21 LAB — URINALYSIS, COMPLETE (UACMP) WITH MICROSCOPIC
Bilirubin Urine: NEGATIVE
Glucose, UA: NEGATIVE mg/dL
Hgb urine dipstick: NEGATIVE
Ketones, ur: NEGATIVE mg/dL
Leukocytes,Ua: NEGATIVE
Nitrite: NEGATIVE
Protein, ur: NEGATIVE mg/dL
Specific Gravity, Urine: 1.006 (ref 1.005–1.030)
pH: 8 (ref 5.0–8.0)

## 2024-03-21 LAB — CMP (CANCER CENTER ONLY)
ALT: 16 U/L (ref 0–44)
AST: 27 U/L (ref 15–41)
Albumin: 4 g/dL (ref 3.5–5.0)
Alkaline Phosphatase: 121 U/L (ref 38–126)
Anion gap: 10 (ref 5–15)
BUN: 12 mg/dL (ref 8–23)
CO2: 24 mmol/L (ref 22–32)
Calcium: 9.1 mg/dL (ref 8.9–10.3)
Chloride: 101 mmol/L (ref 98–111)
Creatinine: 0.84 mg/dL (ref 0.44–1.00)
GFR, Estimated: >60 mL/min (ref >60)
Glucose, Bld: 189 mg/dL — ABNORMAL HIGH (ref 70–99)
Potassium: 4.5 mmol/L (ref 3.5–5.1)
Sodium: 135 mmol/L (ref 135–145)
Total Bilirubin: 0.6 mg/dL (ref 0.0–1.2)
Total Protein: 8 g/dL (ref 6.5–8.1)

## 2024-03-21 LAB — CBC WITH DIFFERENTIAL (CANCER CENTER ONLY)
Abs Immature Granulocytes: 0.04 K/uL (ref 0.00–0.07)
Basophils Absolute: 0 K/uL (ref 0.0–0.1)
Basophils Relative: 0 %
Eosinophils Absolute: 0.5 K/uL (ref 0.0–0.5)
Eosinophils Relative: 6 %
HCT: 40.4 % (ref 36.0–46.0)
Hemoglobin: 13.6 g/dL (ref 12.0–15.0)
Immature Granulocytes: 1 %
Lymphocytes Relative: 25 %
Lymphs Abs: 2 K/uL (ref 0.7–4.0)
MCH: 31.6 pg (ref 26.0–34.0)
MCHC: 33.7 g/dL (ref 30.0–36.0)
MCV: 93.7 fL (ref 80.0–100.0)
Monocytes Absolute: 0.7 K/uL (ref 0.1–1.0)
Monocytes Relative: 9 %
Neutro Abs: 4.6 K/uL (ref 1.7–7.7)
Neutrophils Relative %: 59 %
Platelet Count: 320 K/uL (ref 150–400)
RBC: 4.31 MIL/uL (ref 3.87–5.11)
RDW: 12.4 % (ref 11.5–15.5)
WBC Count: 8 K/uL (ref 4.0–10.5)
nRBC: 0 % (ref 0.0–0.2)

## 2024-03-21 LAB — TOTAL PROTEIN, URINE DIPSTICK: Protein, ur: NEGATIVE mg/dL

## 2024-03-21 LAB — TSH: TSH: 4.349 u[IU]/mL (ref 0.350–4.500)

## 2024-03-21 MED ORDER — RIFAXIMIN 550 MG TABLET
ORAL_TABLET | Freq: Two times a day (BID) | ORAL | 10 refills | 30.00000 days | Status: CP
Start: 2024-03-21 — End: 2025-02-14

## 2024-03-21 MED ORDER — SODIUM CHLORIDE 0.9 % IV SOLN
INTRAVENOUS | Status: DC
  Filled 2024-03-21: qty 250

## 2024-03-21 MED ORDER — SODIUM CHLORIDE 0.9 % IV SOLN
15.0000 mg/kg | Freq: Once | INTRAVENOUS | Status: AC
  Administered 2024-03-21: 800 mg via INTRAVENOUS
  Filled 2024-03-21: qty 32

## 2024-03-21 MED ORDER — SODIUM CHLORIDE 0.9 % IV SOLN
1200.0000 mg | Freq: Once | INTRAVENOUS | Status: AC
  Administered 2024-03-21: 1200 mg via INTRAVENOUS
  Filled 2024-03-21: qty 20

## 2024-03-21 NOTE — Assessment & Plan Note (Signed)
Treatment as planned above. 

## 2024-03-21 NOTE — Telephone Encounter (Signed)
 Attempted to call patient regarding port request to offer apt, per [facility] slot available 10/2 @ 11. Left voicemail asking to return call if agreeable to proceed

## 2024-03-21 NOTE — Assessment & Plan Note (Signed)
 Decreased urine volume, pressure.  Check UA and urine culture.

## 2024-03-21 NOTE — Assessment & Plan Note (Signed)
 Multifocal HCC, s/p local therapy.  August MRI and liver biopsy confirmed disease progression.  Recommend systemic treatment with Atezolizumab  plus bevacizumab  Recent EGD showed no varices Labs are reviewed and discussed with patient. Proceed with cycle 1 Atezolizumab  + Bevacizumab 

## 2024-03-21 NOTE — Progress Notes (Signed)
 Hematology/Oncology Progress note [phone] [phone]         Patient Care Team: Melvin Pao, NP as PCP - General (Nurse Practitioner) Maurie Rayfield BIRCH, RN as Oncology Nurse Navigator Babara Call, MD as Consulting Physician (Oncology)   CHIEF COMPLAINTS/REASON FOR VISIT:   multifocal HCC   ASSESSMENT & PLAN:   Hepatocellular carcinoma (HCC) Multifocal HCC, s/p local therapy.  August MRI and liver biopsy confirmed disease progression.  Recommend systemic treatment with Atezolizumab  plus bevacizumab  Recent EGD showed no varices Labs are reviewed and discussed with patient. Proceed with cycle 1 Atezolizumab  + Bevacizumab    Hepatic encephalopathy (HCC) managed with rifaximin .  Has not tolerated lactulose in past.   Other cirrhosis of liver (HCC) Compensated. monitor  Encounter for antineoplastic chemotherapy Treatment as planned above.   Dysuria Decreased urine volume, pressure.  Check UA and urine culture.   Orders Placed This Encounter  Procedures   Urine Culture    Standing Status:   Future    Number of Occurrences:   1    Expected Date:   03/21/2024    Expiration Date:   03/21/2025   CBC with Differential (Cancer Center Only)    Standing Status:   Future    Expected Date:   04/11/2024    Expiration Date:   04/11/2025   CMP (Cancer Center only)    Standing Status:   Future    Expected Date:   04/11/2024    Expiration Date:   04/11/2025   CBC with Differential (Cancer Center Only)    Standing Status:   Future    Expected Date:   05/02/2024    Expiration Date:   05/02/2025   CMP (Cancer Center only)    Standing Status:   Future    Expected Date:   05/02/2024    Expiration Date:   05/02/2025   T4    Standing Status:   Future    Expected Date:   05/02/2024    Expiration Date:   05/02/2025   TSH    Standing Status:   Future    Expected Date:   05/02/2024    Expiration Date:   05/02/2025   Total Protein, Urine dipstick    Standing  Status:   Future    Expected Date:   05/02/2024    Expiration Date:   05/02/2025   Urinalysis, Complete w Microscopic    Standing Status:   Future    Number of Occurrences:   1    Expected Date:   03/21/2024    Expiration Date:   03/21/2025    All questions were answered. The patient knows to call the clinic with any problems, questions or concerns.  Call Babara, MD, PhD [facility] Hematology Oncology 03/21/2024    HISTORY OF PRESENTING ILLNESS:   Monique Tucker is a  63 y.o.  female with PMH listed below was seen in consultation at the request of  Babara Call, MD  for evaluation of multifocal [facility]  Patient had ultrasounds done in June 2022 which showed features of liver cirrhosis and a scattered hypodense hepatic lesions largest measuring 2.4 cm. 03/22/2021, patient had a liver MRI done which showed a 3 hyperenhancing lesions of the liver.  Highly suspicious for HCC, LIRAD 5. Size of 3 lesions are 2.0 x 1.7 cm, 1.4 x 1.1 cm, 2.1 x 1.4 cm. Liver cirrhosis.  Patient was recommended by primary care provider to establish care with gastroenterology. 04/30/2021, lab work showed elevated alkaline phosphatase of  132, AST and ALT are mildly elevated.  Normal bilirubin.  05/24/2021, patient was seen by gastroenterology Dr. Therisa. Additional blood work was obtained.  AFP was elevated in the 300s. Hepatitis C antibody is positive.  Negative antimicrosomal antibody, negative HIV, negative ANA, negative mitochondrial/smooth muscle cell antibody panel.  Ferritin was elevated at 510.  Normal iron saturation.  Elevated immunoglobulins 1689, celiac panel negative.  Alpha antitrypsin elevated 221.  Patient has a history of alcohol use.  14 cans of beer per week.  She has stopped alcohol use since her visits with gastroenterology.  Current everyday smoker.  A pack a day.  She is working on smoke cessation Patient works at [employer]. She has anxiety and depression and she previously follows up with a  psychiatrist.  She has not followed up with a psychiatrist recently.  Patient takes Wellbutrin, Effexor , Seroquel .  INTERVAL HISTORY Monique Tucker is a 63 y.o. female who has above history reviewed by me today presents for follow up visit for [facility]  Oncology care records  was reviewed.  June 22: US  for known HCV/EtOH ESLD-> 2.4cm liver lesions 03/22/21: 3 LIRADS5 1.4-2cm on MRI; AFP 300s 06/26/21: LR5 seg 6 lesion, LR4/3 lesion seg 4, 6, 1 07/02/21: discussed LRT/TACE/TARE-> future systemic therapy 08/06/21: TACE; 03/07/22: lap ablation 08/14/21-01/26/24: followed surveillance with Dr. Erick 01/26/2024 MRI abdomen w wo contrast   Interval prominent increase in size of segment 5 and 6, 7.4 x 3.8 x 4.9 cm Enlarged central necrotic lymph nodes in the portacaval region and aortocaval region. Treated lesion located in segment 6 and 7 is not visualized    02/23/24: liver carcinoma on bx; no varices on EGD; BCLC-A->met ot lymph node She was recommended to start first line systemic treatment with Atezolizumab  plus bevacizumab . She prefers to reestablish care and get treatment locally.   Patient was accompanied by daughter. She denies abdominal pain.  Appetite is fair.  INTERVAL HISTORY Monique Tucker is a 63 y.o. female who has above history reviewed by me today presents for follow up visit for [facility] treatments.   She report some abdominal pressure, but not able to produce much urine. No burning sensation.  No other new complaints.    Review of Systems  Constitutional:  Negative for appetite change, chills, fatigue and fever.  HENT:   Negative for hearing loss and voice change.   Eyes:  Negative for eye problems.  Respiratory:  Negative for chest tightness and cough.   Cardiovascular:  Negative for chest pain.  Gastrointestinal:  Negative for abdominal distention, abdominal pain and blood in stool.  Endocrine: Negative for hot flashes.  Genitourinary:  Negative for difficulty urinating and  frequency.   Musculoskeletal:  Negative for arthralgias.  Skin:  Negative for itching and rash.  Neurological:  Negative for extremity weakness.  Hematological:  Negative for adenopathy.  Psychiatric/Behavioral:  Positive for depression. Negative for confusion. The patient is nervous/anxious.     MEDICAL HISTORY:  Past Medical History:  Diagnosis Date   Acute hepatitis C    Anxiety    AR (allergic rhinitis)    Arthritis    Bulging lumbar disc    COPD (chronic obstructive pulmonary disease) (HCC)    mild   Depression    Hypertension    Plantar fasciitis    Restless leg syndrome, controlled    Sciatica    left    SURGICAL HISTORY: Past Surgical History:  Procedure Laterality Date   BLADDER REPAIR  CATARACT EXTRACTION W/PHACO Left 05/13/2020   Procedure: CATARACT EXTRACTION PHACO AND INTRAOCULAR LENS PLACEMENT (IOC) LEFT;  Surgeon: Mittie Gaskin, MD;  Location: [facility];  Service: Ophthalmology;  Laterality: Left;  5.15 049.8 10.4%   CATARACT EXTRACTION W/PHACO Right 05/27/2020   Procedure: CATARACT EXTRACTION PHACO AND INTRAOCULAR LENS PLACEMENT (IOC) RIGHT;  Surgeon: Mittie Gaskin, MD;  Location: [facility];  Service: Ophthalmology;  Laterality: Right;  3.29 0:55.2 6.0%   CYSTOCELE REPAIR N/A 10/19/2015   Procedure: ANTERIOR REPAIR (CYSTOCELE);  Surgeon: Gladis DELENA Dollar, MD;  Location: [facility];  Service: Gynecology;  Laterality: N/A;   ESOPHAGOGASTRODUODENOSCOPY (EGD) WITH PROPOFOL  N/A 05/25/2021   Procedure: ESOPHAGOGASTRODUODENOSCOPY (EGD) WITH PROPOFOL ;  Surgeon: Therisa Bi, MD;  Location: [facility];  Service: Gastroenterology;  Laterality: N/A;   EYE SURGERY     RECTOCELE REPAIR N/A 05/02/2016   Procedure: POSTERIOR REPAIR (RECTOCELE);  Surgeon: Gladis DELENA Dollar, MD;  Location: [facility];  Service: Gynecology;  Laterality: N/A;   TOTAL VAGINAL HYSTERECTOMY     TUBAL LIGATION     VAGINAL HYSTERECTOMY Bilateral 10/19/2015    Procedure: HYSTERECTOMY VAGINAL WITH BILATERAL SALPINGO-OOPHERECTOMY;  Surgeon: Gladis DELENA Dollar, MD;  Location: [facility];  Service: Gynecology;  Laterality: Bilateral;    SOCIAL HISTORY: Social History   Socioeconomic History   Marital status: Single    Spouse name: Not on file   Number of children: Not on file   Years of education: Not on file   Highest education level: 12th grade  Occupational History   Not on file  Tobacco Use   Smoking status: Every Day    Current packs/day: 1.00    Types: Cigarettes   Smokeless tobacco: Never   Tobacco comments:     started age 79  Vaping Use   Vaping status: Every Day  Substance and Sexual Activity   Alcohol use: Not Currently    Comment:     Drug use: No   Sexual activity: Not Currently  Other Topics Concern   Not on file  Social History Narrative   Not on file   Social Drivers of Health   Financial Resource Strain: Medium Risk (09/26/2023)   Overall Financial Resource Strain (CARDIA)    Difficulty of Paying Living Expenses: Somewhat hard  Food Insecurity: Food Insecurity Present (09/26/2023)   Hunger Vital Sign    Worried About Running Out of Food in the Last Year: Sometimes true    Ran Out of Food in the Last Year: Never true  Transportation Needs: Unmet Transportation Needs (09/26/2023)   PRAPARE - Transportation    Lack of Transportation (Medical): Yes    Lack of Transportation (Non-Medical): Yes  Physical Activity: Unknown (09/26/2023)   Exercise Vital Sign    Days of Exercise per Week: 0 days    Minutes of Exercise per Session: Not on file  Stress: Stress Concern Present (09/26/2023)   [employer] of Occupational Health - Occupational Stress Questionnaire    Feeling of Stress : To some extent  Social Connections: Socially Isolated (09/26/2023)   Social Connection and Isolation Panel    Frequency of Communication with Friends and Family: More than three times a week    Frequency of Social Gatherings with Friends and  Family: Never    Attends Religious Services: Never    Database administrator or Organizations: No    Attends Banker Meetings: Not on file    Marital Status: Divorced  Intimate Partner Violence: Not At Risk (02/22/2024)  Received from [facility]   Humiliation, Afraid, Rape, and Kick questionnaire    Within the last year, have you been afraid of your partner or ex-partner?: No    Within the last year, have you been humiliated or emotionally abused in other ways by your partner or ex-partner?: No    Within the last year, have you been kicked, hit, slapped, or otherwise physically hurt by your partner or ex-partner?: No    Within the last year, have you been raped or forced to have any kind of sexual activity by your partner or ex-partner?: No    FAMILY HISTORY: Family History  Problem Relation Age of Onset   Breast cancer Mother    Pancreatic cancer Mother    Cancer Mother        breast and pancreatic   Hypertension Mother    Migraines Mother    Diabetes Mother    Heart disease Father    Mental illness Sister        depression   Heart attack Sister    Diabetes Sister    Alcohol abuse Maternal Grandfather    Cancer Maternal Grandmother        lung and brain   Mental illness Sister        depression    ALLERGIES:  is allergic to bee venom, hydralazine , other, and lisinopril .  MEDICATIONS:  Current Outpatient Medications  Medication Sig Dispense Refill   albuterol  (VENTOLIN  HFA) 108 (90 Base) MCG/ACT inhaler Inhale 2 puffs into the lungs every 6 (six) hours as needed for wheezing or shortness of breath. 18 g 3   amLODipine  (NORVASC ) 2.5 MG tablet TAKE ONE TABLET BY MOUTH ONCE A DAY. TO TAKE IN CONJUNCTION WITH FIVE MG TABLET TO = A DOSE OF 7.5 MG 90 tablet 1   amLODipine  (NORVASC ) 5 MG tablet TAKE ONE TABLET BY MOUTH ONCE A DAY. TAKE WITH THE 2.5 MG TABLET TO EQUAL A TOTAL DOSAGE OF 7.5 MG 90 tablet 1   busPIRone  (BUSPAR ) 30 MG tablet TAKE ONE TABLET (30 MG  TOTAL) BY MOUTH TWO (TWO) TIMES DAILY. 180 tablet 0   cetirizine  (ZYRTEC  ALLERGY) 10 MG tablet Take 1 tablet (10 mg total) by mouth daily. 90 tablet 1   conjugated estrogens  (PREMARIN ) vaginal cream Place 1 Applicatorful vaginally daily. 42.5 g 12   diclofenac  Sodium (VOLTAREN ) 1 % GEL Apply 2 g topically 4 (four) times daily. 50 g 0   EPINEPHrine  0.3 mg/0.3 mL IJ SOAJ injection INJECT 0.3 MLS INTO THE MUSCLE ONCE 2 each 1   fluticasone -salmeterol (ADVAIR DISKUS) 250-50 MCG/ACT AEPB Inhale 1 puff into the lungs in the morning and at bedtime. 60 each 2   gabapentin  (NEURONTIN ) 300 MG capsule TAKE ONE CAPSULE BY MOUTH THREE TIMES A DAY 270 capsule 1   losartan  (COZAAR ) 100 MG tablet TAKE ONE TABLET (100 MG TOTAL) BY MOUTH DAILY. 90 tablet 1   Magnesium  500 MG TABS Take 500 mg by mouth daily.     Melatonin 5 MG CHEW Chew by mouth.     methocarbamol  (ROBAXIN ) 500 MG tablet TAKE ONE TABLET (500 MG TOTAL) BY MOUTH FOUR TIMES DAILY. 60 tablet 0   montelukast  (SINGULAIR ) 10 MG tablet TAKE ONE TABLET BY MOUTH AT BEDTIME 90 tablet 1   ondansetron  (ZOFRAN ) 8 MG tablet Take 1 tablet (8 mg total) by mouth every 8 (eight) hours as needed for nausea or vomiting. 30 tablet 1   potassium chloride  SA (KLOR-CON  M) 20 MEQ tablet  TAKE ONE TABLET BY MOUTH ONCE A DAY 90 tablet 0   propranolol (INDERAL) 10 MG tablet      QUEtiapine  (SEROQUEL  XR) 200 MG 24 hr tablet Take 1 tablet (200 mg total) by mouth at bedtime. 90 tablet 1   rifaximin  (XIFAXAN ) 550 MG TABS tablet Take 1 tablet (550 mg total) by mouth 2 (two) times daily.     Tiotropium Bromide  Monohydrate (SPIRIVA  RESPIMAT) 2.5 MCG/ACT AERS INHALE 1 PUFF BY MOUTH INTO THE LUNGS DAILY 4 g 11   venlafaxine  XR (EFFEXOR -XR) 150 MG 24 hr capsule Take 1 capsule (150 mg total) by mouth daily with breakfast. 90 capsule 0   methylPREDNISolone  (MEDROL  DOSEPAK) 4 MG TBPK tablet Take as directed (Patient not taking: Reported on 03/21/2024) 21 tablet 0   OXcarbazepine  (TRILEPTAL )  150 MG tablet Take 1 tablet (150 mg total) by mouth 2 (two) times daily. (Patient not taking: Reported on 03/21/2024) 90 tablet 1   Varenicline  Tartrate, Starter, (CHANTIX  STARTING MONTH PAK) 0.5 MG X 11 & 1 MG X 42 TBPK Take one 0.5 mg tablet by mouth once daily for 3 days, then increase to one 0.5 mg tablet twice daily for 4 days, then increase to one 1 mg tablet twice daily. (Patient not taking: Reported on 03/21/2024) 53 each 0   Current Facility-Administered Medications  Medication Dose Route Frequency Provider Last Rate Last Admin   betamethasone  acetate-betamethasone  sodium phosphate  (CELESTONE ) injection 3 mg  3 mg Intramuscular Once Evans, Brent M, DPM       betamethasone  acetate-betamethasone  sodium phosphate  (CELESTONE ) injection 3 mg  3 mg Intramuscular Once Evans, Brent M, DPM       Facility-Administered Medications Ordered in Other Visits  Medication Dose Route Frequency Provider Last Rate Last Admin   0.9 %  sodium chloride  infusion   Intravenous Continuous Babara Call, MD   Stopped at 03/21/24 1218     PHYSICAL EXAMINATION: ECOG PERFORMANCE STATUS: 1 - Symptomatic but completely ambulatory Vitals:   03/21/24 0850  BP: 124/69  Pulse: 74  Resp: 16  Temp: (!) 96.8 F (36 C)  SpO2: 95%   Filed Weights   03/21/24 0850  Weight: 121 lb 4.8 oz (55 kg)    Physical Exam Constitutional:      General: She is not in acute distress. HENT:     Head: Normocephalic and atraumatic.  Eyes:     General: No scleral icterus. Cardiovascular:     Rate and Rhythm: Normal rate and regular rhythm.     Heart sounds: Normal heart sounds.  Pulmonary:     Effort: Pulmonary effort is normal. No respiratory distress.     Breath sounds: No wheezing.  Abdominal:     General: Bowel sounds are normal. There is no distension.     Palpations: Abdomen is soft.  Musculoskeletal:        General: No deformity. Normal range of motion.     Cervical back: Normal range of motion and neck supple.   Skin:    General: Skin is warm and dry.     Findings: No erythema or rash.  Neurological:     Mental Status: She is alert and oriented to person, place, and time. Mental status is at baseline.     Cranial Nerves: No cranial nerve deficit.     Coordination: Coordination normal.     LABORATORY DATA:  I have reviewed the data as listed Lab Results  Component Value Date   WBC 8.0 03/21/2024   HGB  13.6 03/21/2024   HCT 40.4 03/21/2024   MCV 93.7 03/21/2024   PLT 320 03/21/2024   Recent Labs    04/17/23 1610 03/11/24 1107 03/21/24 0818  NA 138 135 135  K 4.1 3.8 4.5  CL 102 102 101  CO2 20 23 24   GLUCOSE 184* 147* 189*  BUN 13 10 12   CREATININE 0.85 0.70 0.84  CALCIUM 9.1 8.8* 9.1  GFRNONAA  --  >60 >60  PROT 7.0 7.3 8.0  ALBUMIN 4.5 3.6 4.0  AST 28 28 27   ALT 22 16 16   ALKPHOS 126* 126 121  BILITOT 0.2 0.3 0.6   Iron/TIBC/Ferritin/ %Sat    Component Value Date/Time   IRON 62 05/24/2021 1344   TIBC 295 05/24/2021 1344   FERRITIN 510 (H) 05/24/2021 1344   IRONPCTSAT 21 05/24/2021 1344      RADIOGRAPHIC STUDIES: I have personally reviewed the radiological images as listed and agreed with the findings in the report. No results found.

## 2024-03-21 NOTE — Assessment & Plan Note (Signed)
 managed with rifaximin .  Has not tolerated lactulose in past.

## 2024-03-21 NOTE — Patient Instructions (Signed)
 [facility] - A DEPT OF [facility]  Discharge Instructions: Thank you for choosing [facility] to provide your oncology and hematology care.  If you have a lab appointment with the Cancer Center, please go directly to the Cancer Center and check in at the registration area.  Wear comfortable clothing and clothing appropriate for easy access to any Portacath or PICC line.   We strive to give you quality time with your provider. You may need to reschedule your appointment if you arrive late (15 or more minutes).  Arriving late affects you and other patients whose appointments are after yours.  Also, if you miss three or more appointments without notifying the office, you may be dismissed from the clinic at the provider's discretion.      For prescription refill requests, have your pharmacy contact our office and allow 72 hours for refills to be completed.    Today you received the following chemotherapy and/or immunotherapy agents- tecentriq , bevacizumab       To help prevent nausea and vomiting after your treatment, we encourage you to take your nausea medication as directed.  BELOW ARE SYMPTOMS THAT SHOULD BE REPORTED IMMEDIATELY: *FEVER GREATER THAN 100.4 F (38 C) OR HIGHER *CHILLS OR SWEATING *NAUSEA AND VOMITING THAT IS NOT CONTROLLED WITH YOUR NAUSEA MEDICATION *UNUSUAL SHORTNESS OF BREATH *UNUSUAL BRUISING OR BLEEDING *URINARY PROBLEMS (pain or burning when urinating, or frequent urination) *BOWEL PROBLEMS (unusual diarrhea, constipation, pain near the anus) TENDERNESS IN MOUTH AND THROAT WITH OR WITHOUT PRESENCE OF ULCERS (sore throat, sores in mouth, or a toothache) UNUSUAL RASH, SWELLING OR PAIN  UNUSUAL VAGINAL DISCHARGE OR ITCHING   Items with * indicate a potential emergency and should be followed up as soon as possible or go to the Emergency Department if any problems should occur.  Please show the CHEMOTHERAPY ALERT CARD or  IMMUNOTHERAPY ALERT CARD at check-in to the Emergency Department and triage nurse.  Should you have questions after your visit or need to cancel or reschedule your appointment, please contact [facility] - A DEPT OF [facility]  [phone] and follow the prompts.  Office hours are 8:00 a.m. to 4:30 p.m. Monday - Friday. Please note that voicemails left after 4:00 p.m. may not be returned until the following business day.  We are closed weekends and major holidays. You have access to a nurse at all times for urgent questions. Please call the main number to the clinic [phone] and follow the prompts.  For any non-urgent questions, you may also contact your provider using MyChart. We now offer e-Visits for anyone 61 and older to request care online for non-urgent symptoms. For details visit [email or web address].   Also download the MyChart app! Go to the app store, search MyChart, open the app, select [facility], and log in with your MyChart username and password.

## 2024-03-21 NOTE — Assessment & Plan Note (Signed)
 Compensated. monitor

## 2024-03-22 ENCOUNTER — Other Ambulatory Visit: Payer: Self-pay

## 2024-03-22 NOTE — Telephone Encounter (Signed)
 Spoke to pt's daughter Leotis and talked to her about port placement date. She said she is ok with date 10/3 arrive at 12p for 1p appt.

## 2024-03-23 LAB — T4: T4, Total: 8.1 ug/dL (ref 4.5–12.0)

## 2024-03-23 LAB — URINE CULTURE: Culture: >=100000 — AB

## 2024-03-25 ENCOUNTER — Telehealth: Payer: Self-pay | Admitting: *Deleted

## 2024-03-25 ENCOUNTER — Ambulatory Visit: Payer: Self-pay | Admitting: Oncology

## 2024-03-25 ENCOUNTER — Other Ambulatory Visit: Payer: Self-pay | Admitting: Oncology

## 2024-03-25 MED ORDER — NITROFURANTOIN MONOHYD MACRO 100 MG PO CAPS: 100.0000 mg | ORAL_CAPSULE | Freq: Two times a day (BID) | ORAL | 0 refills | Status: DC

## 2024-03-25 NOTE — Telephone Encounter (Signed)
 called

## 2024-03-25 NOTE — Telephone Encounter (Signed)
 The family member wanted to have someone call about the treatment last week

## 2024-03-26 ENCOUNTER — Other Ambulatory Visit: Payer: Self-pay | Admitting: Radiology

## 2024-03-26 ENCOUNTER — Other Ambulatory Visit: Payer: Self-pay | Admitting: Oncology

## 2024-03-26 DIAGNOSIS — C220 Liver cell carcinoma: Secondary | ICD-10-CM

## 2024-03-26 NOTE — Progress Notes (Signed)
 Pt has seen Mychart message.

## 2024-03-27 ENCOUNTER — Other Ambulatory Visit: Payer: Self-pay | Admitting: Nurse Practitioner

## 2024-03-27 ENCOUNTER — Encounter: Payer: Self-pay | Admitting: Oncology

## 2024-03-28 NOTE — Progress Notes (Signed)
 Patient for IR Port Placement on Friday 03/29/24, I called and spoke with the patient on the phone and gave pre-procedure instructions. Pt was made aware to be here at 12p, NPO after MN prior to procedure as well as driver post procedure/recovery/discharge. Pt stated understanding.  Called 03/28/24

## 2024-03-28 NOTE — Telephone Encounter (Signed)
 Too soon for refill, LRF 11/30/23 for 90 and 1 RF.  Requested Prescriptions  Pending Prescriptions Disp Refills   amLODipine  (NORVASC ) 5 MG tablet [Pharmacy Med Name: AMLODIPINE  BESYLATE 5MG  TABLET] 90 tablet 1    Sig: TAKE ONE TABLET BY MOUTH ONCE A DAY. TAKE WITH THE TWO AND A HALF (2 & 1/2) MG TABLET TO EQUAL A TOTAL DOSAGE OF 7.5 MG     Cardiovascular: Calcium Channel Blockers 2 Passed - 03/28/2024  4:13 PM      Passed - Last BP in normal range    BP Readings from Last 1 Encounters:  03/21/24 (!) 126/55         Passed - Last Heart Rate in normal range    Pulse Readings from Last 1 Encounters:  03/21/24 67         Passed - Valid encounter within last 6 months    Recent Outpatient Visits           3 months ago Bipolar 1 disorder (HCC)   [facility] Melvin Pao, NP   4 months ago Bipolar 1 disorder [facility])   [facility] Melvin Pao, NP   5 months ago Bipolar 1 disorder [facility])   [facility] Melvin Pao, NP   6 months ago Acute low back pain without sciatica, unspecified back pain laterality    [facility] Melvin Pao, NP

## 2024-03-29 ENCOUNTER — Ambulatory Visit
Admission: RE | Admit: 2024-03-29 | Discharge: 2024-03-29 | Disposition: A | Discharge status: Home / Self Care | Source: Ambulatory Visit | Attending: Oncology | Admitting: Oncology

## 2024-03-29 ENCOUNTER — Inpatient Hospital Stay: Admit: 2024-03-29 | Discharge: 2024-03-29 | Payer: Medicaid (Managed Care)

## 2024-03-29 DIAGNOSIS — C779 Secondary and unspecified malignant neoplasm of lymph node, unspecified: Secondary | ICD-10-CM | POA: Insufficient documentation

## 2024-03-29 DIAGNOSIS — C220 Liver cell carcinoma: Secondary | ICD-10-CM | POA: Diagnosis not present

## 2024-03-29 DIAGNOSIS — Z5982 Transportation insecurity: Secondary | ICD-10-CM | POA: Diagnosis not present

## 2024-03-29 DIAGNOSIS — Z59868 Other specified financial insecurity: Secondary | ICD-10-CM | POA: Diagnosis not present

## 2024-03-29 DIAGNOSIS — K7682 Hepatic encephalopathy: Secondary | ICD-10-CM | POA: Diagnosis not present

## 2024-03-29 DIAGNOSIS — Z5941 Food insecurity: Secondary | ICD-10-CM | POA: Diagnosis not present

## 2024-03-29 DIAGNOSIS — F17210 Nicotine dependence, cigarettes, uncomplicated: Secondary | ICD-10-CM | POA: Diagnosis not present

## 2024-03-29 DIAGNOSIS — F17290 Nicotine dependence, other tobacco product, uncomplicated: Secondary | ICD-10-CM | POA: Diagnosis not present

## 2024-03-29 DIAGNOSIS — Z604 Social exclusion and rejection: Secondary | ICD-10-CM | POA: Diagnosis not present

## 2024-03-29 HISTORY — PX: IR IMAGING GUIDED PORT INSERTION: IMG5740

## 2024-03-29 MED ORDER — LIDOCAINE HCL 1 % IJ SOLN
INTRAMUSCULAR | Status: AC
  Filled 2024-03-29: qty 20

## 2024-03-29 MED ORDER — HEPARIN SOD (PORK) LOCK FLUSH 100 UNIT/ML IV SOLN
INTRAVENOUS | Status: AC
  Filled 2024-03-29: qty 5

## 2024-03-29 MED ORDER — FENTANYL CITRATE (PF) 100 MCG/2ML IJ SOLN
INTRAMUSCULAR | Status: AC | PRN
  Administered 2024-03-29 (×2): 50 ug via INTRAVENOUS

## 2024-03-29 MED ORDER — LIDOCAINE HCL 1 % IJ SOLN
20.0000 mL | Freq: Once | INTRAMUSCULAR | Status: AC
  Administered 2024-03-29: 20 mL via INTRADERMAL

## 2024-03-29 MED ORDER — SODIUM CHLORIDE 0.9 % IV SOLN: INTRAVENOUS | Status: DC

## 2024-03-29 MED ORDER — FENTANYL CITRATE (PF) 100 MCG/2ML IJ SOLN
INTRAMUSCULAR | Status: AC
  Filled 2024-03-29: qty 2

## 2024-03-29 MED ORDER — MIDAZOLAM HCL 2 MG/2ML IJ SOLN
INTRAMUSCULAR | Status: AC
  Filled 2024-03-29: qty 2

## 2024-03-29 MED ORDER — MIDAZOLAM HCL 2 MG/2ML IJ SOLN
INTRAMUSCULAR | Status: AC | PRN
  Administered 2024-03-29: 1 mg via INTRAVENOUS
  Administered 2024-03-29 (×2): .5 mg via INTRAVENOUS

## 2024-03-29 MED ORDER — HEPARIN SOD (PORK) LOCK FLUSH 100 UNIT/ML IV SOLN
500.0000 [IU] | Freq: Once | INTRAVENOUS | Status: AC
  Administered 2024-03-29: 500 [IU] via INTRAVENOUS

## 2024-03-29 NOTE — H&P (Signed)
 Chief Complaint:  Multifocal Hepatocellular Carcinoma  Procedure: Port-a-catheter insertion  Referring Provider(s): Dr. Zelphia Cap  Supervising Physician: Jenna Hacker  Patient Status: [facility] - Out-pt  History of Present Illness: Monique Tucker is a 63 y.o. female with a history of hepatocellular carcinoma s/p TACE on 08/06/21 with recurrent LR-5 lesion s/p repeat TACE on 03/07/22 noted to have a new LR-M lesion at her follow up with [facility] in August 2025. Given the atypical appearance of the lesion, it was decided to re-biopsy for definitive diagnosis. 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 Patient has elected to undergo chemotherapy for her continued treatment. She has already undergone her first infusion, but was referred to IR for port-a-catheter insertion for continued treatments.   Patient resting in bed in no acute distress. Admits to a constant, dull-aching pain of her right groin, but denies any pain in her abdomen or chest, shortness of breath, fevers/chills. Endorses decreased appetite and a small amount of weight loss in the last week. NPO since midnight. All questions and concerns answered at the bedside.   Patient is Full Code  Past Medical History:  Diagnosis Date   Acute hepatitis C    Anxiety    AR (allergic rhinitis)    Arthritis    Bulging lumbar disc    COPD (chronic obstructive pulmonary disease) (HCC)    mild   Depression    Hypertension    Plantar fasciitis    Restless leg syndrome, controlled    Sciatica    left    Past Surgical History:  Procedure Laterality Date   BLADDER REPAIR     CATARACT EXTRACTION W/PHACO Left 05/13/2020   Procedure: CATARACT EXTRACTION PHACO AND INTRAOCULAR LENS PLACEMENT (IOC) LEFT;  Surgeon: Mittie Gaskin, MD;  Location: [facility];  Service: Ophthalmology;  Laterality: Left;  5.15 049.8 10.4%   CATARACT EXTRACTION W/PHACO  Right 05/27/2020   Procedure: CATARACT EXTRACTION PHACO AND INTRAOCULAR LENS PLACEMENT (IOC) RIGHT;  Surgeon: Mittie Gaskin, MD;  Location: [facility] ORS;  Service: Ophthalmology;  Laterality: Right;  3.29 0:55.2 6.0%   CYSTOCELE REPAIR N/A 10/19/2015   Procedure: ANTERIOR REPAIR (CYSTOCELE);  Surgeon: Gladis DELENA Dollar, MD;  Location: [facility] ORS;  Service: Gynecology;  Laterality: N/A;   ESOPHAGOGASTRODUODENOSCOPY (EGD) WITH PROPOFOL  N/A 05/25/2021   Procedure: ESOPHAGOGASTRODUODENOSCOPY (EGD) WITH PROPOFOL ;  Surgeon: Therisa Bi, MD;  Location: [facility];  Service: Gastroenterology;  Laterality: N/A;   EYE SURGERY     RECTOCELE REPAIR N/A 05/02/2016   Procedure: POSTERIOR REPAIR (RECTOCELE);  Surgeon: Gladis DELENA Dollar, MD;  Location: [facility] ORS;  Service: Gynecology;  Laterality: N/A;   TOTAL VAGINAL HYSTERECTOMY     TUBAL LIGATION     VAGINAL HYSTERECTOMY Bilateral 10/19/2015   Procedure: HYSTERECTOMY VAGINAL WITH BILATERAL SALPINGO-OOPHERECTOMY;  Surgeon: Gladis DELENA Dollar, MD;  Location: [facility] ORS;  Service: Gynecology;  Laterality: Bilateral;    Allergies: Bee venom, Hydralazine , Other, and Lisinopril   Medications: Prior to Admission medications   Medication Sig Start Date End Date Taking? Authorizing Provider  nitrofurantoin , macrocrystal-monohydrate, (MACROBID ) 100 MG capsule Take 1 capsule (100 mg total) by mouth 2 (two) times daily. 03/25/24   Cap Zelphia, MD  albuterol  (VENTOLIN  HFA) 108 (90 Base) MCG/ACT inhaler Inhale 2 puffs into the lungs every 6 (six) hours as needed for wheezing or shortness of breath. 09/27/23   Melvin Pao, NP  amLODipine  (NORVASC ) 2.5 MG tablet TAKE ONE TABLET BY  MOUTH ONCE A DAY. TO TAKE IN CONJUNCTION WITH FIVE MG TABLET TO = A DOSE OF 7.5 MG 12/21/23   Melvin Pao, NP  amLODipine  (NORVASC ) 5 MG tablet TAKE ONE TABLET BY MOUTH ONCE A DAY. TAKE WITH THE 2.5 MG TABLET TO EQUAL A TOTAL DOSAGE OF 7.5 MG 11/30/23   Melvin Pao, NP   busPIRone  (BUSPAR ) 30 MG tablet TAKE ONE TABLET (30 MG TOTAL) BY MOUTH TWO (TWO) TIMES DAILY. 02/27/24   Melvin Pao, NP  cetirizine  (ZYRTEC  ALLERGY) 10 MG tablet Take 1 tablet (10 mg total) by mouth daily. 11/06/23   Melvin Pao, NP  conjugated estrogens  (PREMARIN ) vaginal cream Place 1 Applicatorful vaginally daily. 12/27/23   Melvin Pao, NP  diclofenac  Sodium (VOLTAREN ) 1 % GEL Apply 2 g topically 4 (four) times daily. 12/20/23   Melvin Pao, NP  EPINEPHrine  0.3 mg/0.3 mL IJ SOAJ injection INJECT 0.3 MLS INTO THE MUSCLE ONCE 12/20/23   Melvin Pao, NP  fluticasone -salmeterol (ADVAIR DISKUS) 250-50 MCG/ACT AEPB Inhale 1 puff into the lungs in the morning and at bedtime. 09/27/23   Melvin Pao, NP  gabapentin  (NEURONTIN ) 300 MG capsule TAKE ONE CAPSULE BY MOUTH THREE TIMES A DAY 09/20/23   Melvin Pao, NP  losartan  (COZAAR ) 100 MG tablet TAKE ONE TABLET (100 MG TOTAL) BY MOUTH DAILY. 12/01/23   Melvin Pao, NP  Magnesium  500 MG TABS Take 500 mg by mouth daily.    [provider]  Melatonin 5 MG CHEW Chew by mouth.    [provider]  methocarbamol  (ROBAXIN ) 500 MG tablet TAKE ONE TABLET (500 MG TOTAL) BY MOUTH FOUR TIMES DAILY. 11/27/23   Melvin Pao, NP  methylPREDNISolone  (MEDROL  DOSEPAK) 4 MG TBPK tablet Take as directed Patient not taking: Reported on 03/21/2024 11/06/23   Melvin Pao, NP  montelukast  (SINGULAIR ) 10 MG tablet TAKE ONE TABLET BY MOUTH AT BEDTIME 10/12/23   Melvin Pao, NP  ondansetron  (ZOFRAN ) 8 MG tablet TAKE ONE TABLET (8 MG TOTAL) BY MOUTH EVERY EIGHT HOURS AS NEEDED FOR NAUSEA OR VOMITING. 03/27/24   Babara Call, MD  OXcarbazepine  (TRILEPTAL ) 150 MG tablet Take 1 tablet (150 mg total) by mouth 2 (two) times daily. Patient not taking: Reported on 03/21/2024 04/17/23   Melvin Pao, NP  potassium chloride  SA (KLOR-CON  M) 20 MEQ tablet TAKE ONE TABLET BY MOUTH ONCE A DAY 02/19/24   Melvin Pao, NP   propranolol (INDERAL) 10 MG tablet  02/21/23   [provider]  QUEtiapine  (SEROQUEL  XR) 200 MG 24 hr tablet Take 1 tablet (200 mg total) by mouth at bedtime. 04/30/21   Melvin Pao, NP  rifaximin  (XIFAXAN ) 550 MG TABS tablet Take 1 tablet (550 mg total) by mouth 2 (two) times daily. 04/17/23   Melvin Pao, NP  Tiotropium Bromide  Monohydrate (SPIRIVA  RESPIMAT) 2.5 MCG/ACT AERS INHALE 1 PUFF BY MOUTH INTO THE LUNGS DAILY 09/27/23   Melvin Pao, NP  Varenicline  Tartrate, Starter, (CHANTIX  STARTING MONTH PAK) 0.5 MG X 11 & 1 MG X 42 TBPK Take one 0.5 mg tablet by mouth once daily for 3 days, then increase to one 0.5 mg tablet twice daily for 4 days, then increase to one 1 mg tablet twice daily. Patient not taking: Reported on 03/21/2024 12/20/23   Melvin Pao, NP  venlafaxine  XR (EFFEXOR -XR) 150 MG 24 hr capsule Take 1 capsule (150 mg total) by mouth daily with breakfast. 06/12/20   Daphane Rosella, NP     Family History  Problem Relation Age of Onset  Breast cancer Mother    Pancreatic cancer Mother    Cancer Mother        breast and pancreatic   Hypertension Mother    Migraines Mother    Diabetes Mother    Heart disease Father    Mental illness Sister        depression   Heart attack Sister    Diabetes Sister    Alcohol abuse Maternal Grandfather    Cancer Maternal Grandmother        lung and brain   Mental illness Sister        depression    Social History   Socioeconomic History   Marital status: Single    Spouse name: Not on file   Number of children: Not on file   Years of education: Not on file   Highest education level: 12th grade  Occupational History   Not on file  Tobacco Use   Smoking status: Every Day    Current packs/day: 1.00    Types: Cigarettes   Smokeless tobacco: Never   Tobacco comments:     started age 64  Vaping Use   Vaping status: Every Day  Substance and Sexual Activity   Alcohol use: Not Currently    Comment:      Drug use: No   Sexual activity: Not Currently  Other Topics Concern   Not on file  Social History Narrative   Not on file   Social Drivers of Health   Financial Resource Strain: Medium Risk (09/26/2023)   Overall Financial Resource Strain (CARDIA)    Difficulty of Paying Living Expenses: Somewhat hard  Food Insecurity: Food Insecurity Present (09/26/2023)   Hunger Vital Sign    Worried About Running Out of Food in the Last Year: Sometimes true    Ran Out of Food in the Last Year: Never true  Transportation Needs: Unmet Transportation Needs (09/26/2023)   PRAPARE - Transportation    Lack of Transportation (Medical): Yes    Lack of Transportation (Non-Medical): Yes  Physical Activity: Unknown (09/26/2023)   Exercise Vital Sign    Days of Exercise per Week: 0 days    Minutes of Exercise per Session: Not on file  Stress: Stress Concern Present (09/26/2023)   [employer] of Occupational Health - Occupational Stress Questionnaire    Feeling of Stress : To some extent  Social Connections: Socially Isolated (09/26/2023)   Social Connection and Isolation Panel    Frequency of Communication with Friends and Family: More than three times a week    Frequency of Social Gatherings with Friends and Family: Never    Attends Religious Services: Never    Database administrator or Organizations: No    Attends Engineer, structural: Not on file    Marital Status: Divorced     Review of Systems  Musculoskeletal:        Constant, dull pain in her groin   Patient denies any headache, chest pain, shortness of breath, abdominal pain, N/V, or fever/chills. All other systems are negative.   Vital Signs: BP (!) 118/59   Pulse 65   Temp 97.6 F (36.4 C) (Oral)   Resp 14   Ht 5' 4 (1.626 m)   Wt 117 lb (53.1 kg)   LMP  (LMP Unknown)   SpO2 93%   BMI 20.08 kg/m    Physical Exam Constitutional:      Appearance: Normal appearance.  HENT:     Mouth/Throat:  Mouth: Mucous membranes  are moist.     Pharynx: Oropharynx is clear.  Cardiovascular:     Rate and Rhythm: Normal rate and regular rhythm.  Pulmonary:     Effort: Pulmonary effort is normal.  Abdominal:     General: Abdomen is flat.     Palpations: Abdomen is soft.     Tenderness: There is no abdominal tenderness.  Skin:    General: Skin is warm and dry.  Neurological:     Mental Status: She is alert and oriented to person, place, and time.  Psychiatric:        Behavior: Behavior normal.     Imaging: No results found.  Labs:  CBC: Recent Labs    03/21/24 0818  WBC 8.0  HGB 13.6  HCT 40.4  PLT 320    COAGS: No results for input(s): INR, APTT in the last 8760 hours.  BMP: Recent Labs    04/17/23 1610 03/11/24 1107 03/21/24 0818  NA 138 135 135  K 4.1 3.8 4.5  CL 102 102 101  CO2 20 23 24   GLUCOSE 184* 147* 189*  BUN 13 10 12   CALCIUM 9.1 8.8* 9.1  CREATININE 0.85 0.70 0.84  GFRNONAA  --  >60 >60    LIVER FUNCTION TESTS: Recent Labs    04/17/23 1610 03/11/24 1107 03/21/24 0818  BILITOT 0.2 0.3 0.6  AST 28 28 27   ALT 22 16 16   ALKPHOS 126* 126 121  PROT 7.0 7.3 8.0  ALBUMIN 4.5 3.6 4.0    TUMOR MARKERS: No results for input(s): AFPTM, CEA, CA199, CHROMGRNA in the last 8760 hours.  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 female with a history of HCC s/p multiple TACE treatments in 2023 recently found to have LR-M lesion of her liver. Pathology confirmed recurrent HCC metastatic to the lymph nodes. She has elected to undergo chemotherapy and presents to [facility] Interventional Radiology department for an image-guided port-a-catheter insertion with Dr. KANDICE Banner. Procedure to be performed under moderate sedation.  Risks and benefits of image guided port-a-catheter placement was discussed with the patient including, but not limited to bleeding, infection, pneumothorax, or fibrin sheath  development and need for additional procedures.  All of the patient's questions were answered, patient is agreeable to proceed. Consent signed and in chart.   Thank you for allowing our service to participate in Monique Tucker 's care.    Electronically Signed: Glennon CHRISTELLA Bal, PA-C   03/29/2024, 12:56 PM     I spent a total of 30 Minutes in face to face in clinical consultation, greater than 50% of which was counseling/coordinating care for port-a-catheter insertion.

## 2024-03-29 NOTE — Discharge Instructions (Signed)
 Implanted [facility] Guide  An implanted port is a type of central line that is placed under the skin. Central lines are used to provide IV access when treatment or nutrition needs to be given through a person's veins. Implanted ports are used for long-term IV access. An implanted port may be placed because: You need IV medicine that would be irritating to the small veins in your hands or arms. You need long-term IV medicines, such as antibiotics. You need IV nutrition for a long period. You need frequent blood draws for lab tests. You need dialysis.   Implanted ports are usually placed in the chest area, but they can also be placed in the upper arm, the abdomen, or the leg. An implanted port has two main parts: Reservoir. The reservoir is round and will appear as a small, raised area under your skin. The reservoir is the part where a needle is inserted to give medicines or draw blood. Catheter. The catheter is a thin, flexible tube that extends from the reservoir. The catheter is placed into a large vein. Medicine that is inserted into the reservoir goes into the catheter and then into the vein.   How will I care for my incision  You may shower tomorrow Please remove dressing in 24hrs not other skin care is needed  How is my port accessed? Special steps must be taken to access the port: Before the port is accessed, a numbing cream can be placed on the skin. This helps numb the skin over the port site. Your health care provider uses a sterile technique to access the port. Your health care provider must put on a mask and sterile gloves. The skin over your port is cleaned carefully with an antiseptic and allowed to dry. The port is gently pinched between sterile gloves, and a needle is inserted into the port. Only "non-coring" port needles should be used to access the port. Once the port is accessed, a blood return should be checked. This helps ensure that the port is in the vein and is not  clogged. If your port needs to remain accessed for a constant infusion, a clear (transparent) bandage will be placed over the needle site. The bandage and needle will need to be changed every week, or as directed by your health care provider.   What is flushing? Flushing helps keep the port from getting clogged. Follow your health care provider's instructions on how and when to flush the port. Ports are usually flushed with saline solution or a medicine called heparin. The need for flushing will depend on how the port is used. If the port is used for intermittent medicines or blood draws, the port will need to be flushed: After medicines have been given. After blood has been drawn. As part of routine maintenance. If a constant infusion is running, the port may not need to be flushed.   How long will my port stay implanted? The port can stay in for as long as your health care provider thinks it is needed. When it is time for the port to come out, surgery will be done to remove it. The procedure is similar to the one performed when the port was put in. When should I seek immediate medical care? When you have an implanted port, you should seek immediate medical care if: You notice a bad smell coming from the incision site. You have swelling, redness, or drainage at the incision site. You have more swelling or pain at the  port site or the surrounding area. You have a fever that is not controlled with medicine.   This information is not intended to replace advice given to you by your health care provider. Make sure you discuss any questions you have with your health care provider. Document Released: 06/13/2005 Document Revised: 11/19/2015 Document Reviewed: 02/18/2013 Elsevier Interactive Patient Education  2017 [employer].

## 2024-03-29 NOTE — Procedures (Signed)
 Interventional Radiology Procedure Note  Procedure: chest port placement  Complications: None  Estimated Blood Loss: < 10 mL  Findings: RIJ SL chest port placed.  Tip at cavo atrial junct.  Cordella DELENA Banner, MD

## 2024-03-29 NOTE — Progress Notes (Signed)
 Patient clinically stable post IR Port placement per Dr Jenna, tolerated well. Vitals stable pre and post procedure. Denies complaints post procedure. Received Versed  2 mg along with Fentanyl  100 mcg IV for procedure. Report given to Rutha Stacks RN post procedure/specials/18

## 2024-03-30 ENCOUNTER — Other Ambulatory Visit (HOSPITAL_COMMUNITY): Payer: Self-pay

## 2024-04-02 ENCOUNTER — Telehealth: Payer: Self-pay | Admitting: *Deleted

## 2024-04-02 ENCOUNTER — Other Ambulatory Visit: Payer: Self-pay

## 2024-04-02 MED ORDER — LIDOCAINE-PRILOCAINE 2.5-2.5 % EX CREA: 1.0000 | TOPICAL_CREAM | CUTANEOUS | 2 refills | Status: AC | PRN

## 2024-04-02 NOTE — Telephone Encounter (Signed)
 Rx for Lidocaine  cream sent to [facility] pharmacy. Pt notified.

## 2024-04-02 NOTE — Telephone Encounter (Signed)
 States that she had not got her Emla cream and she was supposed to get some pain medicine FOR her port.  Patient wants a callback about this

## 2024-04-09 ENCOUNTER — Encounter: Payer: Self-pay | Admitting: Oncology

## 2024-04-10 ENCOUNTER — Other Ambulatory Visit: Payer: Self-pay | Admitting: *Deleted

## 2024-04-10 DIAGNOSIS — C220 Liver cell carcinoma: Secondary | ICD-10-CM

## 2024-04-11 ENCOUNTER — Encounter: Payer: Self-pay | Admitting: Oncology

## 2024-04-11 ENCOUNTER — Inpatient Hospital Stay

## 2024-04-11 ENCOUNTER — Inpatient Hospital Stay (HOSPITAL_BASED_OUTPATIENT_CLINIC_OR_DEPARTMENT_OTHER): Admitting: Oncology

## 2024-04-11 ENCOUNTER — Inpatient Hospital Stay: Attending: Oncology

## 2024-04-11 VITALS — BP 136/68 | HR 59

## 2024-04-11 VITALS — BP 139/65 | HR 56 | Temp 97.8°F | Resp 19 | Ht 64.0 in | Wt 122.8 lb

## 2024-04-11 DIAGNOSIS — F17210 Nicotine dependence, cigarettes, uncomplicated: Secondary | ICD-10-CM | POA: Insufficient documentation

## 2024-04-11 DIAGNOSIS — Z5111 Encounter for antineoplastic chemotherapy: Secondary | ICD-10-CM | POA: Diagnosis not present

## 2024-04-11 DIAGNOSIS — Z5112 Encounter for antineoplastic immunotherapy: Secondary | ICD-10-CM | POA: Diagnosis present

## 2024-04-11 DIAGNOSIS — C220 Liver cell carcinoma: Secondary | ICD-10-CM

## 2024-04-11 DIAGNOSIS — K7469 Other cirrhosis of liver: Secondary | ICD-10-CM | POA: Diagnosis not present

## 2024-04-11 LAB — CMP (CANCER CENTER ONLY)
ALT: 39 U/L (ref 0–44)
AST: 51 U/L — ABNORMAL HIGH (ref 15–41)
Albumin: 3.7 g/dL (ref 3.5–5.0)
Alkaline Phosphatase: 128 U/L — ABNORMAL HIGH (ref 38–126)
Anion gap: 8 (ref 5–15)
BUN: 11 mg/dL (ref 8–23)
CO2: 25 mmol/L (ref 22–32)
Calcium: 9.1 mg/dL (ref 8.9–10.3)
Chloride: 98 mmol/L (ref 98–111)
Creatinine: 0.68 mg/dL (ref 0.44–1.00)
GFR, Estimated: >60 mL/min (ref >60)
Glucose, Bld: 152 mg/dL — ABNORMAL HIGH (ref 70–99)
Potassium: 4.6 mmol/L (ref 3.5–5.1)
Sodium: 131 mmol/L — ABNORMAL LOW (ref 135–145)
Total Bilirubin: 0.6 mg/dL (ref 0.0–1.2)
Total Protein: 7.7 g/dL (ref 6.5–8.1)

## 2024-04-11 LAB — CBC WITH DIFFERENTIAL (CANCER CENTER ONLY)
Abs Immature Granulocytes: 0.03 K/uL (ref 0.00–0.07)
Basophils Absolute: 0 K/uL (ref 0.0–0.1)
Basophils Relative: 0 %
Eosinophils Absolute: 0.2 K/uL (ref 0.0–0.5)
Eosinophils Relative: 5 %
HCT: 41.1 % (ref 36.0–46.0)
Hemoglobin: 14.2 g/dL (ref 12.0–15.0)
Immature Granulocytes: 1 %
Lymphocytes Relative: 33 %
Lymphs Abs: 1.5 K/uL (ref 0.7–4.0)
MCH: 31.1 pg (ref 26.0–34.0)
MCHC: 34.5 g/dL (ref 30.0–36.0)
MCV: 90.1 fL (ref 80.0–100.0)
Monocytes Absolute: 0.5 K/uL (ref 0.1–1.0)
Monocytes Relative: 10 %
Neutro Abs: 2.4 K/uL (ref 1.7–7.7)
Neutrophils Relative %: 51 %
Platelet Count: 180 K/uL (ref 150–400)
RBC: 4.56 MIL/uL (ref 3.87–5.11)
RDW: 12.8 % (ref 11.5–15.5)
WBC Count: 4.6 K/uL (ref 4.0–10.5)
nRBC: 0 % (ref 0.0–0.2)

## 2024-04-11 LAB — TOTAL PROTEIN, URINE DIPSTICK: Protein, ur: NEGATIVE mg/dL

## 2024-04-11 MED ORDER — SODIUM CHLORIDE 0.9 % IV SOLN
INTRAVENOUS | Status: DC
  Filled 2024-04-11: qty 250

## 2024-04-11 MED ORDER — SODIUM CHLORIDE 0.9 % IV SOLN
1200.0000 mg | Freq: Once | INTRAVENOUS | Status: AC
  Administered 2024-04-11: 1200 mg via INTRAVENOUS
  Filled 2024-04-11: qty 20

## 2024-04-11 MED ORDER — SODIUM CHLORIDE 0.9 % IV SOLN
15.0000 mg/kg | Freq: Once | INTRAVENOUS | Status: AC
  Administered 2024-04-11: 800 mg via INTRAVENOUS
  Filled 2024-04-11: qty 32

## 2024-04-11 NOTE — Patient Instructions (Signed)

## 2024-04-11 NOTE — Assessment & Plan Note (Addendum)
 Multifocal HCC, s/p local therapy.  August MRI and liver biopsy confirmed disease progression.  Recommend systemic treatment with Atezolizumab  plus bevacizumab  Recent EGD showed no varices Labs are reviewed and discussed with patient. Proceed with cycle 2 Atezolizumab  + Bevacizumab 

## 2024-04-11 NOTE — Progress Notes (Signed)
 Patient has no new or acute concerns.

## 2024-04-11 NOTE — Assessment & Plan Note (Addendum)
 Compensated. Monitor LFT, mild elevated LFT maybe due to immunotherapy

## 2024-04-11 NOTE — Progress Notes (Signed)
 Hematology/Oncology Progress note [phone] [phone]         Patient Care Team: Melvin Pao, NP as PCP - General (Nurse Practitioner) Maurie Rayfield BIRCH, RN as Oncology Nurse Navigator Babara Call, MD as Consulting Physician (Oncology)   CHIEF COMPLAINTS/REASON FOR VISIT:   multifocal HCC   ASSESSMENT & PLAN:   Hepatocellular carcinoma (HCC) Multifocal HCC, s/p local therapy.  August MRI and liver biopsy confirmed disease progression.  Recommend systemic treatment with Atezolizumab  plus bevacizumab  Recent EGD showed no varices Labs are reviewed and discussed with patient. Proceed with cycle 2 Atezolizumab  + Bevacizumab    Encounter for antineoplastic chemotherapy Treatment as planned above.   Other cirrhosis of liver (HCC) Compensated. Monitor LFT, mild elevated LFT maybe due to immunotherapy  Orders Placed This Encounter  Procedures   CBC with Differential (Cancer Center Only)    Standing Status:   Future    Expected Date:   05/23/2024    Expiration Date:   05/23/2025   CMP (Cancer Center only)    Standing Status:   Future    Expected Date:   05/23/2024    Expiration Date:   05/23/2025   Follow up in 3 weeks . All questions were answered. The patient knows to call the clinic with any problems, questions or concerns.  Call Babara, MD, PhD [facility] Hematology Oncology 04/11/2024    HISTORY OF PRESENTING ILLNESS:   Monique Tucker is a  63 y.o.  female with PMH listed below was seen in consultation at the request of  Babara Call, MD  for evaluation of multifocal [facility]  Patient had ultrasounds done in June 2022 which showed features of liver cirrhosis and a scattered hypodense hepatic lesions largest measuring 2.4 cm. 03/22/2021, patient had a liver MRI done which showed a 3 hyperenhancing lesions of the liver.  Highly suspicious for HCC, LIRAD 5. Size of 3 lesions are 2.0 x 1.7 cm, 1.4 x 1.1 cm, 2.1 x 1.4 cm. Liver cirrhosis.  Patient was  recommended by primary care provider to establish care with gastroenterology. 04/30/2021, lab work showed elevated alkaline phosphatase of 132, AST and ALT are mildly elevated.  Normal bilirubin.  05/24/2021, patient was seen by gastroenterology Dr. Therisa. Additional blood work was obtained.  AFP was elevated in the 300s. Hepatitis C antibody is positive.  Negative antimicrosomal antibody, negative HIV, negative ANA, negative mitochondrial/smooth muscle cell antibody panel.  Ferritin was elevated at 510.  Normal iron saturation.  Elevated immunoglobulins 1689, celiac panel negative.  Alpha antitrypsin elevated 221.  Patient has a history of alcohol use.  14 cans of beer per week.  She has stopped alcohol use since her visits with gastroenterology.  Current everyday smoker.  A pack a day.  She is working on smoke cessation Patient works at [employer]. She has anxiety and depression and she previously follows up with a psychiatrist.  She has not followed up with a psychiatrist recently.  Patient takes Wellbutrin, Effexor , Seroquel .  INTERVAL HISTORY Monique Tucker is a 63 y.o. female who has above history reviewed by me today presents for follow up visit for [facility]  Oncology care records  was reviewed.  June 22: US  for known HCV/EtOH ESLD-> 2.4cm liver lesions 03/22/21: 3 LIRADS5 1.4-2cm on MRI; AFP 300s 06/26/21: LR5 seg 6 lesion, LR4/3 lesion seg 4, 6, 1 07/02/21: discussed LRT/TACE/TARE-> future systemic therapy 08/06/21: TACE; 03/07/22: lap ablation 08/14/21-01/26/24: followed surveillance with Dr. Erick 01/26/2024 MRI abdomen w wo contrast  Interval prominent increase in size of segment 5 and 6, 7.4 x 3.8 x 4.9 cm Enlarged central necrotic lymph nodes in the portacaval region and aortocaval region. 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 She prefers to reestablish care and get treatment locally.   Patient was accompanied by daughter. She denies abdominal pain.  Appetite is fair.  INTERVAL HISTORY Monique Tucker is a 63 y.o. female who has above history reviewed by me today presents for follow up visit for [facility] treatments.   She finished course of antibiotics for UTI. Symptoms resolved.  She had pain at port site after medi port placement. Pain has subsided now.  No other new complaints.    Review of Systems  Constitutional:  Negative for appetite change, chills, fatigue and fever.  HENT:   Negative for hearing loss and voice change.   Eyes:  Negative for eye problems.  Respiratory:  Negative for chest tightness and cough.   Cardiovascular:  Negative for chest pain.  Gastrointestinal:  Negative for abdominal distention, abdominal pain and blood in stool.  Endocrine: Negative for hot flashes.  Genitourinary:  Negative for difficulty urinating and frequency.   Musculoskeletal:  Negative for arthralgias.  Skin:  Negative for itching and rash.  Neurological:  Negative for extremity weakness.  Hematological:  Negative for adenopathy.  Psychiatric/Behavioral:  Positive for depression. Negative for confusion. The patient is nervous/anxious.     MEDICAL HISTORY:  Past Medical History:  Diagnosis Date   Acute hepatitis C    Anxiety    AR (allergic rhinitis)    Arthritis    Bulging lumbar disc    COPD (chronic obstructive pulmonary disease) (HCC)    mild   Depression    Hypertension    Plantar fasciitis    Restless leg syndrome, controlled    Sciatica    left    SURGICAL HISTORY: Past Surgical History:  Procedure Laterality Date   BLADDER REPAIR     CATARACT EXTRACTION W/PHACO Left 05/13/2020   Procedure: CATARACT EXTRACTION PHACO AND INTRAOCULAR LENS PLACEMENT (IOC) LEFT;  Surgeon: Mittie Gaskin, MD;  Location: [facility];  Service: Ophthalmology;   Laterality: Left;  5.15 049.8 10.4%   CATARACT EXTRACTION W/PHACO Right 05/27/2020   Procedure: CATARACT EXTRACTION PHACO AND INTRAOCULAR LENS PLACEMENT (IOC) RIGHT;  Surgeon: Mittie Gaskin, MD;  Location: [facility];  Service: Ophthalmology;  Laterality: Right;  3.29 0:55.2 6.0%   CYSTOCELE REPAIR N/A 10/19/2015   Procedure: ANTERIOR REPAIR (CYSTOCELE);  Surgeon: Gladis DELENA Dollar, MD;  Location: [facility] OR[facility];  Service: Gynecology;  Laterality: N/A;   ESOPHAGOGASTRODUODENOSCOPY (EGD) WITH PROPOFOL  N/A 05/25/2021   Procedure: ESOPHAGOGASTRODUODENOSCOPY (EGD) WITH PROPOFOL ;  Surgeon: Therisa Bi, MD;  Location[facility];  Service: Gastroenterology;  Laterality: N/A;   EYE SURGERY     IR IMAGING GUIDED PORT INSERTION  03/29/2024   RECTOCELE REPAIR N/A 05/02/2016   Procedure: POSTERIOR REPAIR (RECTOCELE);  Surgeon: Gladis DELENA Dollar, MD;  Location: [facility];  Service: Gynecology;  Laterality: N/A;   TOTAL VAGINAL HYSTERECTOMY     TUBAL LIGATION     VAGINAL HYSTERECTOMY Bilateral 10/19/2015   Procedure: HYSTERECTOMY VAGINAL WITH BILATERAL SALPINGO-OOPHERECTOMY;  Surgeon: Gladis DELENA Dollar, MD;  Location: [facility] OR[facility];  Service: Gynecology;  Laterality: Bilateral;    SOCIAL HISTORY: Social History  Socioeconomic History   Marital status: Single    Spouse name: Not on file   Number of children: Not on file   Years of education: Not on file   Highest education level: 12th grade  Occupational History   Not on file  Tobacco Use   Smoking status: Every Day    Current packs/day: 1.00    Types: Cigarettes   Smokeless tobacco: Never   Tobacco comments:     started age 65  Vaping Use   Vaping status: Every Day  Substance and Sexual Activity   Alcohol use: Not Currently    Comment:     Drug use: No   Sexual activity: Not Currently  Other Topics Concern   Not on file  Social History Narrative   Not on file   Social Drivers of Health   Financial Resource Strain:  Medium Risk (09/26/2023)   Overall Financial Resource Strain (CARDIA)    Difficulty of Paying Living Expenses: Somewhat hard  Food Insecurity: Food Insecurity Present (09/26/2023)   Hunger Vital Sign    Worried About Running Out of Food in the Last Year: Sometimes true    Ran Out of Food in the Last Year: Never true  Transportation Needs: Unmet Transportation Needs (09/26/2023)   PRAPARE - Administrator, Civil Service (Medical): Yes    Lack of Transportation (Non-Medical): Yes  Physical Activity: Unknown (09/26/2023)   Exercise Vital Sign    Days of Exercise per Week: 0 days    Minutes of Exercise per Session: Not on file  Stress: Stress Concern Present (09/26/2023)   [employer] of Occupational Health - Occupational Stress Questionnaire    Feeling of Stress : To some extent  Social Connections: Socially Isolated (09/26/2023)   Social Connection and Isolation Panel    Frequency of Communication with Friends and Family: More than three times a week    Frequency of Social Gatherings with Friends and Family: Never    Attends Religious Services: Never    Database administrator or Organizations: No    Attends Engineer, structural: Not on file    Marital Status: Divorced  Intimate Partner Violence: Not At Risk (02/22/2024)   [facility]   Humiliation, Afraid, Rape, and Kick questionnaire    Within the last year, have you been afraid of your partner or ex-partner?: No    Within the last year, have you been humiliated or emotionally abused in other ways by your partner or ex-partner?: No    Within the last year, have you been kicked, hit, slapped, or otherwise physically hurt by your partner or ex-partner?: No    Within the last year, have you been raped or forced to have any kind of sexual activity by your partner or ex-partner?: No    FAMILY HISTORY: Family History  Problem Relation Age of Onset   Breast cancer Mother    Pancreatic cancer Mother     Cancer Mother        breast and pancreatic   Hypertension Mother    Migraines Mother    Diabetes Mother    Heart disease Father    Mental illness Sister        depression   Heart attack Sister    Diabetes Sister    Alcohol abuse Maternal Grandfather    Cancer Maternal Grandmother        lung and brain   Mental illness Sister  depression    ALLERGIES:  is allergic to bee venom, hydralazine , other, and lisinopril .  MEDICATIONS:  Current Outpatient Medications  Medication Sig Dispense Refill   albuterol  (VENTOLIN  HFA) 108 (90 Base) MCG/ACT inhaler Inhale 2 puffs into the lungs every 6 (six) hours as needed for wheezing or shortness of breath. 18 g 3   amLODipine  (NORVASC ) 2.5 MG tablet TAKE ONE TABLET BY MOUTH ONCE A DAY. TO TAKE IN CONJUNCTION WITH FIVE MG TABLET TO = A DOSE OF 7.5 MG 90 tablet 1   amLODipine  (NORVASC ) 5 MG tablet TAKE ONE TABLET BY MOUTH ONCE A DAY. TAKE WITH THE 2.5 MG TABLET TO EQUAL A TOTAL DOSAGE OF 7.5 MG 90 tablet 1   busPIRone  (BUSPAR ) 30 MG tablet TAKE ONE TABLET (30 MG TOTAL) BY MOUTH TWO (TWO) TIMES DAILY. 180 tablet 0   cetirizine  (ZYRTEC  ALLERGY) 10 MG tablet Take 1 tablet (10 mg total) by mouth daily. 90 tablet 1   conjugated estrogens  (PREMARIN ) vaginal cream Place 1 Applicatorful vaginally daily. 42.5 g 12   diclofenac  Sodium (VOLTAREN ) 1 % GEL Apply 2 g topically 4 (four) times daily. 50 g 0   EPINEPHrine  0.3 mg/0.3 mL IJ SOAJ injection INJECT 0.3 MLS INTO THE MUSCLE ONCE 2 each 1   fluticasone -salmeterol (ADVAIR DISKUS) 250-50 MCG/ACT AEPB Inhale 1 puff into the lungs in the morning and at bedtime. 60 each 2   gabapentin  (NEURONTIN ) 300 MG capsule TAKE ONE CAPSULE BY MOUTH THREE TIMES A DAY 270 capsule 1   lidocaine -prilocaine (EMLA) cream Apply 1 Application topically as needed. Apply to port and cover with saran wrap 1-2 hours prior to port access 30 g 2   losartan  (COZAAR ) 100 MG tablet TAKE ONE TABLET (100 MG TOTAL) BY MOUTH DAILY. 90 tablet  1   Magnesium  500 MG TABS Take 500 mg by mouth daily.     Melatonin 5 MG CHEW Chew by mouth.     methocarbamol  (ROBAXIN ) 500 MG tablet TAKE ONE TABLET (500 MG TOTAL) BY MOUTH FOUR TIMES DAILY. 60 tablet 0   methylPREDNISolone  (MEDROL  DOSEPAK) 4 MG TBPK tablet Take as directed 21 tablet 0   montelukast  (SINGULAIR ) 10 MG tablet TAKE ONE TABLET BY MOUTH AT BEDTIME 90 tablet 1   nitrofurantoin , macrocrystal-monohydrate, (MACROBID ) 100 MG capsule Take 1 capsule (100 mg total) by mouth 2 (two) times daily. 10 capsule 0   ondansetron  (ZOFRAN ) 8 MG tablet TAKE ONE TABLET (8 MG TOTAL) BY MOUTH EVERY EIGHT HOURS AS NEEDED FOR NAUSEA OR VOMITING. 90 tablet 1   potassium chloride  SA (KLOR-CON  M) 20 MEQ tablet TAKE ONE TABLET BY MOUTH ONCE A DAY 90 tablet 0   propranolol (INDERAL) 10 MG tablet      QUEtiapine  (SEROQUEL  XR) 200 MG 24 hr tablet Take 1 tablet (200 mg total) by mouth at bedtime. 90 tablet 1   rifaximin  (XIFAXAN ) 550 MG TABS tablet Take 1 tablet (550 mg total) by mouth 2 (two) times daily.     Tiotropium Bromide  Monohydrate (SPIRIVA  RESPIMAT) 2.5 MCG/ACT AERS INHALE 1 PUFF BY MOUTH INTO THE LUNGS DAILY 4 g 11   venlafaxine  XR (EFFEXOR -XR) 150 MG 24 hr capsule Take 1 capsule (150 mg total) by mouth daily with breakfast. 90 capsule 0   OXcarbazepine  (TRILEPTAL ) 150 MG tablet Take 1 tablet (150 mg total) by mouth 2 (two) times daily. (Patient not taking: Reported on 03/21/2024) 90 tablet 1   Varenicline  Tartrate, Starter, (CHANTIX  STARTING MONTH PAK) 0.5 MG X 11 & 1  MG X 42 TBPK Take one 0.5 mg tablet by mouth once daily for 3 days, then increase to one 0.5 mg tablet twice daily for 4 days, then increase to one 1 mg tablet twice daily. (Patient not taking: Reported on 04/11/2024) 53 each 0   Current Facility-Administered Medications  Medication Dose Route Frequency Provider Last Rate Last Admin   betamethasone  acetate-betamethasone  sodium phosphate  (CELESTONE ) injection 3 mg  3 mg Intramuscular Once  Evans, Brent M, DPM       betamethasone  acetate-betamethasone  sodium phosphate  (CELESTONE ) injection 3 mg  3 mg Intramuscular Once Janit Thresa HERO, DPM       Facility-Administered Medications Ordered in Other Visits  Medication Dose Route Frequency Provider Last Rate Last Admin   0.9 %  sodium chloride  infusion   Intravenous Continuous Babara Call, MD   Stopped at 04/11/24 1154     PHYSICAL EXAMINATION: ECOG PERFORMANCE STATUS: 1 - Symptomatic but completely ambulatory Vitals:   04/11/24 0911  BP: 139/65  Pulse: (!) 56  Resp: 19  Temp: 97.8 F (36.6 C)  SpO2: 98%   Filed Weights   04/11/24 0911  Weight: 122 lb 12.8 oz (55.7 kg)    Physical Exam Constitutional:      General: She is not in acute distress. HENT:     Head: Normocephalic and atraumatic.  Eyes:     General: No scleral icterus. Cardiovascular:     Rate and Rhythm: Normal rate and regular rhythm.     Heart sounds: Normal heart sounds.  Pulmonary:     Effort: Pulmonary effort is normal. No respiratory distress.     Breath sounds: No wheezing.  Abdominal:     General: Bowel sounds are normal. There is no distension.     Palpations: Abdomen is soft.  Musculoskeletal:        General: No deformity. Normal range of motion.     Cervical back: Normal range of motion and neck supple.  Skin:    General: Skin is warm and dry.     Findings: No erythema or rash.  Neurological:     Mental Status: She is alert and oriented to person, place, and time. Mental status is at baseline.     Cranial Nerves: No cranial nerve deficit.     Coordination: Coordination normal.     LABORATORY DATA:  I have reviewed the data as listed Lab Results  Component Value Date   WBC 4.6 04/11/2024   HGB 14.2 04/11/2024   HCT 41.1 04/11/2024   MCV 90.1 04/11/2024   PLT 180 04/11/2024   Recent Labs    03/11/24 1107 03/21/24 0818 04/11/24 0903  NA 135 135 131*  K 3.8 4.5 4.6  CL 102 101 98  CO2 23 24 25   GLUCOSE 147* 189* 152*   BUN 10 12 11   CREATININE 0.70 0.84 0.68  CALCIUM 8.8* 9.1 9.1  GFRNONAA >60 >60 >60  PROT 7.3 8.0 7.7  ALBUMIN 3.6 4.0 3.7  AST 28 27 51*  ALT 16 16 39  ALKPHOS 126 121 128*  BILITOT 0.3 0.6 0.6   Iron/TIBC/Ferritin/ %Sat    Component Value Date/Time   IRON 62 05/24/2021 1344   TIBC 295 05/24/2021 1344   FERRITIN 510 (H) 05/24/2021 1344   IRONPCTSAT 21 05/24/2021 1344      RADIOGRAPHIC STUDIES: I have personally reviewed the radiological images as listed and agreed with the findings in the report. IR IMAGING GUIDED PORT INSERTION Result Date: 03/29/2024 CLINICAL DATA:  Hepatocellular  carcinoma. Chest port placement for therapy. EXAM: Chest port catheter placement TECHNIQUE: Procedure performed using fluoroscopy and ultrasound CONTRAST:  None RADIOPHARMACEUTICALS:  None FLUOROSCOPY: 1 mGy COMPARISON:  None FINDINGS: The patient was placed in supine position on the IR gantry and the right upper chest and neck were prepped and draped in the usual sterile fashion. The nurse administered intravenous fentanyl  and Versed  under my supervision and the nurse had no other injuries other than monitoring the patient and administering medications. I was present for the entire duration of procedure. 2 mg intravenous Versed  and 100 mcg intravenous fentanyl  were administered for a total sedation time of 18 minutes. Ultrasound guidance was used to investigate the right internal jugular vein which was anechoic and compressible indicating patency. The needle was then advanced from a scan negative through the soft tissue into the right internal jugular vein under ultrasound guidance. A final image was obtained and stored in the patient's permanent medical record. Access was then exchanged over a guidewire which was advanced under fluoroscopic guidance. The needle was removed and replaced with a micropuncture sheath. Approximately 2 inches below the clavicle the port pocket was created with a subsequent  incision. The catheter was then tunneled from the port pocket to the venotomy site overlying the right internal jugular vein. Access was then exchanged over an 035 guidewire for peel-away sheath which was advanced over the guidewire under fluoroscopic guidance. The catheter was then advanced through the peel-away sheath. Sheath was removed. The catheter was then cut at the port pocket and connected to chest port. The chest port was tested for function and finally function well. The chest port was then flushed with heparin and a port pocket was closed with 4-0 suture. Final image was obtained demonstrating satisfactory position of chest port. The final count of all materials was satisfactory. IMPRESSION: 1. Satisfactory placement of right internal jugular vein single-lumen chest port. The catheter tip is at the cavoatrial junction. 2.  Okay to use and power inject chest port. Electronically Signed   By: Cordella Banner   On: 03/29/2024 16:09

## 2024-04-11 NOTE — Assessment & Plan Note (Signed)
Treatment as planned above. 

## 2024-04-12 ENCOUNTER — Other Ambulatory Visit: Payer: Self-pay

## 2024-04-18 ENCOUNTER — Other Ambulatory Visit: Payer: Self-pay

## 2024-04-19 NOTE — Telephone Encounter (Signed)
 Patient Robin Velez was called in attempt number 1 to reach them regarding scheduling their upcoming appointments on 06/05/24.     The call resulted in: Message left    Please schedule the patient upon their return call on 12/10 for the following:     Labs and Dr. Casey Return Active visit    Thank you,    Dena ONEIDA Dage

## 2024-04-24 NOTE — Telephone Encounter (Signed)
 Patient Robin Velez was called in attempt number 2 to reach them regarding scheduling their upcoming appointments on 06/05/24.     The call resulted in: Message left    Please schedule the patient upon their return call on 12/10 for the following:     Labs and Dr. Casey Return Active    Thank you,    Dena ONEIDA Dage

## 2024-04-26 ENCOUNTER — Encounter: Payer: Self-pay | Admitting: Oncology

## 2024-04-26 DIAGNOSIS — K7682 Hepatic encephalopathy    (CMS-HCC): Principal | ICD-10-CM

## 2024-05-02 ENCOUNTER — Telehealth: Payer: Self-pay | Admitting: Oncology

## 2024-05-02 ENCOUNTER — Inpatient Hospital Stay: Admitting: Oncology

## 2024-05-02 ENCOUNTER — Other Ambulatory Visit: Payer: Self-pay | Admitting: Oncology

## 2024-05-02 ENCOUNTER — Inpatient Hospital Stay

## 2024-05-02 NOTE — Assessment & Plan Note (Deleted)
 Compensated. Monitor LFT, mild elevated LFT maybe due to immunotherapy

## 2024-05-02 NOTE — Assessment & Plan Note (Deleted)
Treatment as planned above. 

## 2024-05-02 NOTE — Telephone Encounter (Signed)
 Pt called and left a vm stating she wants to cancel her in fusion this morning.

## 2024-05-02 NOTE — Assessment & Plan Note (Deleted)
 Multifocal HCC, s/p local therapy.  August MRI and liver biopsy confirmed disease progression.  Recommend systemic treatment with Atezolizumab  plus bevacizumab  Recent EGD showed no varices Labs are reviewed and discussed with patient. Proceed with cycle 3 Atezolizumab  + Bevacizumab 

## 2024-05-02 NOTE — Telephone Encounter (Signed)
 Spoke to pt and she states that she is having diarrhea and did not want to come in with these symptoms. She is ok to r/s. Prefers to do Thursdays.   Dr. Babara has updated IS, please notify pt of new appt details.

## 2024-05-03 ENCOUNTER — Other Ambulatory Visit: Payer: Self-pay

## 2024-05-09 ENCOUNTER — Inpatient Hospital Stay: Admitting: Oncology

## 2024-05-09 ENCOUNTER — Inpatient Hospital Stay: Attending: Oncology

## 2024-05-09 ENCOUNTER — Inpatient Hospital Stay

## 2024-05-09 NOTE — Progress Notes (Signed)
 [facility]  05/10/2024    Reason for visit: Follow up for Hepatocellular carcinoma    (CMS-HCC) [C22.0]    Assessment & Plan  Hepatocellular carcinoma, now BCLC-C  - Continue follow up with local oncologist and systemic therapy  - MRI today likely to be unrevealing given that she's only received one cycle    Port site cellulitis  Redness and tenderness at port site, likely cellulitis. Non purulent.  - Prescribed 10-day Keflex.  - Advised to contact oncologist if no improvement.    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     Medical decision making high based on complexity of cancer, interpretation of cancer response, and treatment risk of complications and monitoring toxicity of cancer treatment.    Follow up in 6 months      Prentice Feeling, MD, MPH  Assistant Professor of Medicine  [facility]  [facility]  Division of Gastroenterology and Hepatology      Subjective   History of Present Illness   Accompanied by: N/A (unaccompanied)    63 y.o. female with BCLC-A, multifocal within [place] s/p TACE 08/06/2021 with recurrent LR-5 lesion s/p repeat TACE 03/07/2022 with subsequent progression to BCLC-C HCC (inflitrative mass, TIV and nodal mets) confirmed to be biopsy-proven HCC    History of Present Illness  She has a history of multifocal hepatocellular carcinoma. Post-procedure, she developed a recurrent LIRADS 5 lesion. In September 2023, her condition progressed to [facility] Cancer stage C, characterized by an infiltrative mass, tumor in vein, and nodal metastases, confirmed by biopsy as progressive HCC. She began systemic therapy with atezolizumab and bevacizumab in October 2023 but missed her last cycle, having received only one cycle instead of the planned two.    She has a port that was placed more than two weeks ago, and she reports that it is tender and looks red. She uses a special pillow to alleviate discomfort from the seatbelt pressing against the port area.    She is experiencing confusion, noting incidents such as placing pots and pans in the refrigerator and forgetting her destination while driving. She attributes some of this to her insurance refusing to cover Rifaximin , which she was prescribed. She is working on resolving this issue with her insurance.    She has been prescribed Trileptal by her psychiatrist, which she finds beneficial, stating it 'lets it roll off of you.'    Her blood pressure was elevated today, and she feels 'nervous' and 'agitated,' noting she is hungry as she hasn't eaten yet.    She is on Medicaid and has limited driving due to her vision and confusion, only driving locally in [place]. She has decided to stop driving altogether due to these issues.     =============================================================    FHSI-8 (Ver. 4)  -----------------------------------------------------------------------------------------------------------    I have a lack of energy      1 (A little bit)    I have nausea       0 (Not at all)    I have pain        1 (A little bit)    I am losing weight       0 (Not at all)    I have pain in my back      2 (Somewhat)    I feel fatigued       1 (A little bit)  I am bothered by jaundice or yellow color to my skin  0 (Not at all)    I have discomfort or pain in my stomach area   0 (Not at all)  =============================================================    Objective   Physical Exam   Vital Signs: BP 200/79  - Pulse 75  - Temp 36.2 ??C (97.2 ??F) (Temporal)  - Resp 16  - Ht 162.6 cm (5' 4)  - Wt 55.4 kg (122 lb 1.6 oz)  - LMP  (LMP Unknown)  - SpO2 94%  - BMI 20.96 kg/m??   Constitutional: She is in no apparent distress  Eyes: Anicteric sclerae  Cardiovascular: No peripheral edema  Gastrointestinal: Soft, nontender abdomen without hepatosplenomegaly, hernias, or masses  Neurologic: Awake, alert, and oriented to person, place, and time with normal speech and no asterixis  Skin: redness overlying port

## 2024-05-09 NOTE — Assessment & Plan Note (Deleted)
 Multifocal HCC, s/p local therapy.  August MRI and liver biopsy confirmed disease progression.  Recommend systemic treatment with Atezolizumab  plus bevacizumab  Recent EGD showed no varices Labs are reviewed and discussed with patient. Proceed with cycle 3 Atezolizumab  + Bevacizumab 

## 2024-05-10 ENCOUNTER — Other Ambulatory Visit: Payer: Self-pay

## 2024-05-10 ENCOUNTER — Ambulatory Visit: Admit: 2024-05-10 | Discharge: 2024-05-10 | Payer: Medicaid (Managed Care)

## 2024-05-10 ENCOUNTER — Other Ambulatory Visit: Admit: 2024-05-10 | Discharge: 2024-05-10 | Payer: Medicaid (Managed Care)

## 2024-05-10 ENCOUNTER — Inpatient Hospital Stay: Admit: 2024-05-10 | Discharge: 2024-05-10 | Payer: Medicaid (Managed Care)

## 2024-05-10 DIAGNOSIS — C220 Liver cell carcinoma: Principal | ICD-10-CM

## 2024-05-10 DIAGNOSIS — K7469 Other cirrhosis of liver: Secondary | ICD-10-CM | POA: Diagnosis not present

## 2024-05-10 DIAGNOSIS — F331 Major depressive disorder, recurrent, moderate: Secondary | ICD-10-CM | POA: Diagnosis not present

## 2024-05-10 DIAGNOSIS — K766 Portal hypertension: Secondary | ICD-10-CM | POA: Diagnosis not present

## 2024-05-10 DIAGNOSIS — F411 Generalized anxiety disorder: Secondary | ICD-10-CM | POA: Diagnosis not present

## 2024-05-10 LAB — COMPREHENSIVE METABOLIC PANEL
ALBUMIN: 4.1 g/dL (ref 3.4–5.0)
ALKALINE PHOSPHATASE: 133 U/L — ABNORMAL HIGH (ref 46–116)
ALT (SGPT): 17 U/L (ref 10–49)
ANION GAP: 8 mmol/L (ref 5–14)
AST (SGOT): 29 U/L (ref <=34)
BILIRUBIN TOTAL: 0.4 mg/dL (ref 0.3–1.2)
BLOOD UREA NITROGEN: 9 mg/dL (ref 9–23)
BUN / CREAT RATIO: 13
CALCIUM: 9.2 mg/dL (ref 8.7–10.4)
CHLORIDE: 98 mmol/L (ref 98–107)
CO2: 26 mmol/L (ref 20.0–31.0)
CREATININE: 0.68 mg/dL (ref 0.55–1.02)
EGFR CKD-EPI (2021) FEMALE: >90 mL/min/1.73m2 (ref >=60)
GLUCOSE RANDOM: 112 mg/dL (ref 70–179)
POTASSIUM: 4.7 mmol/L (ref 3.5–5.1)
PROTEIN TOTAL: 7.7 g/dL (ref 5.7–8.2)
SODIUM: 132 mmol/L — ABNORMAL LOW (ref 135–145)

## 2024-05-10 LAB — CBC
HEMATOCRIT: 39.5 % (ref 34.0–44.0)
HEMOGLOBIN: 13.9 g/dL (ref 11.3–14.9)
MEAN CORPUSCULAR HEMOGLOBIN CONC: 35.1 g/dL (ref 32.0–36.0)
MEAN CORPUSCULAR HEMOGLOBIN: 31.7 pg (ref 25.9–32.4)
MEAN CORPUSCULAR VOLUME: 90.2 fL (ref 77.6–95.7)
MEAN PLATELET VOLUME: 7.7 fL (ref 6.8–10.7)
PLATELET COUNT: 200 10*9/L (ref 150–450)
RED BLOOD CELL COUNT: 4.38 10*12/L (ref 3.95–5.13)
RED CELL DISTRIBUTION WIDTH: 13.8 % (ref 12.2–15.2)
WBC ADJUSTED: 5.7 10*9/L (ref 3.6–11.2)

## 2024-05-10 LAB — AFP TUMOR MARKER: AFP-TUMOR MARKER: 13 ng/mL — ABNORMAL HIGH (ref <=8)

## 2024-05-10 LAB — PROTIME-INR
INR: 0.95
PROTIME: 10.8 s (ref 9.9–12.6)

## 2024-05-10 MED ORDER — CEPHALEXIN 500 MG CAPSULE
ORAL_CAPSULE | Freq: Four times a day (QID) | ORAL | 0 refills | 10.00000 days | Status: CP
Start: 2024-05-10 — End: 2024-05-20

## 2024-05-10 MED ADMIN — gadopiclenol (ELUCIREM,VUEWAY) injection 0.1 mL/kg (Dosing Weight): .1 mL/kg | INTRAVENOUS | @ 19:00:00 | Stop: 2024-05-10

## 2024-05-13 ENCOUNTER — Encounter: Payer: Self-pay | Admitting: Oncology

## 2024-05-14 ENCOUNTER — Other Ambulatory Visit: Payer: Self-pay | Admitting: Oncology

## 2024-05-14 DIAGNOSIS — C220 Liver cell carcinoma: Secondary | ICD-10-CM

## 2024-05-16 ENCOUNTER — Other Ambulatory Visit: Payer: Self-pay | Admitting: Nurse Practitioner

## 2024-05-17 NOTE — Telephone Encounter (Signed)
 Requested Prescriptions  Pending Prescriptions Disp Refills   potassium chloride  SA (KLOR-CON  M) 20 MEQ tablet [Pharmacy Med Name: POTASSIUM CHLORIDE  ER ER TABLET ER] 90 tablet 0    Sig: TAKE ONE TABLET BY MOUTH ONCE A DAY     Endocrinology:  Minerals - Potassium Supplementation Passed - 05/17/2024  7:39 PM      Passed - K in normal range and within 360 days    Potassium  Date Value Ref Range Status  04/11/2024 4.6 3.5 - 5.1 mmol/L Final         Passed - Cr in normal range and within 360 days    Creatinine  Date Value Ref Range Status  04/11/2024 0.68 0.44 - 1.00 mg/dL Final         Passed - Valid encounter within last 12 months    Recent Outpatient Visits           4 months ago Bipolar 1 disorder [facility])   [place] [facility] Melvin Pao, NP   6 months ago Bipolar 1 disorder [facility])   [place] [facility] Melvin Pao, NP   7 months ago Bipolar 1 disorder [facility])   [place] [facility] Melvin Pao, NP   7 months ago Acute low back pain without sciatica, unspecified back pain laterality   [place] Melvin Pao, NP               busPIRone  (BUSPAR ) 30 MG tablet [Pharmacy Med Name: BUSPIRONE  HYDROCHLORIDE 30MG  TABLET] 60 tablet 0    Sig: TAKE ONE TABLET (30 MG TOTAL) BY MOUTH TWO (TWO) TIMES DAILY.     Psychiatry: Anxiolytics/Hypnotics - Non-controlled Passed - 05/17/2024  7:39 PM      Passed - Valid encounter within last 12 months    Recent Outpatient Visits           4 months ago Bipolar 1 disorder [facility])   [facility] Melvin Pao, NP   6 months ago Bipolar 1 disorder [facility])   [facility] Melvin Pao, NP   7 months ago Bipolar 1 disorder [facility])   [place] [facility] Melvin Pao, NP   7 months ago Acute low back pain without sciatica, unspecified back pain laterality     [facility] Melvin Pao, NP

## 2024-05-22 ENCOUNTER — Inpatient Hospital Stay

## 2024-05-22 ENCOUNTER — Inpatient Hospital Stay: Admitting: Oncology

## 2024-05-30 ENCOUNTER — Inpatient Hospital Stay: Attending: Oncology

## 2024-05-30 ENCOUNTER — Inpatient Hospital Stay

## 2024-05-30 ENCOUNTER — Inpatient Hospital Stay: Attending: Oncology | Admitting: Oncology

## 2024-05-30 ENCOUNTER — Other Ambulatory Visit: Payer: Self-pay | Admitting: Nurse Practitioner

## 2024-05-30 ENCOUNTER — Encounter: Payer: Self-pay | Admitting: Oncology

## 2024-05-30 VITALS — BP 152/61 | HR 63

## 2024-05-30 VITALS — BP 147/71 | HR 78 | Temp 97.2°F | Resp 18 | Wt 124.2 lb

## 2024-05-30 DIAGNOSIS — C220 Liver cell carcinoma: Secondary | ICD-10-CM | POA: Insufficient documentation

## 2024-05-30 DIAGNOSIS — Z801 Family history of malignant neoplasm of trachea, bronchus and lung: Secondary | ICD-10-CM | POA: Insufficient documentation

## 2024-05-30 DIAGNOSIS — Z5111 Encounter for antineoplastic chemotherapy: Secondary | ICD-10-CM

## 2024-05-30 DIAGNOSIS — K7682 Hepatic encephalopathy: Secondary | ICD-10-CM | POA: Insufficient documentation

## 2024-05-30 DIAGNOSIS — K7469 Other cirrhosis of liver: Secondary | ICD-10-CM

## 2024-05-30 DIAGNOSIS — J449 Chronic obstructive pulmonary disease, unspecified: Secondary | ICD-10-CM | POA: Insufficient documentation

## 2024-05-30 DIAGNOSIS — F418 Other specified anxiety disorders: Secondary | ICD-10-CM | POA: Diagnosis not present

## 2024-05-30 DIAGNOSIS — Z800 Family history of malignant neoplasm of digestive organs: Secondary | ICD-10-CM | POA: Diagnosis not present

## 2024-05-30 DIAGNOSIS — F17210 Nicotine dependence, cigarettes, uncomplicated: Secondary | ICD-10-CM | POA: Insufficient documentation

## 2024-05-30 DIAGNOSIS — I10 Essential (primary) hypertension: Secondary | ICD-10-CM | POA: Diagnosis not present

## 2024-05-30 DIAGNOSIS — Z90710 Acquired absence of both cervix and uterus: Secondary | ICD-10-CM | POA: Insufficient documentation

## 2024-05-30 DIAGNOSIS — Z7951 Long term (current) use of inhaled steroids: Secondary | ICD-10-CM | POA: Insufficient documentation

## 2024-05-30 DIAGNOSIS — G2581 Restless legs syndrome: Secondary | ICD-10-CM | POA: Insufficient documentation

## 2024-05-30 DIAGNOSIS — Z79899 Other long term (current) drug therapy: Secondary | ICD-10-CM | POA: Insufficient documentation

## 2024-05-30 DIAGNOSIS — Z803 Family history of malignant neoplasm of breast: Secondary | ICD-10-CM | POA: Diagnosis not present

## 2024-05-30 DIAGNOSIS — Z5112 Encounter for antineoplastic immunotherapy: Secondary | ICD-10-CM | POA: Diagnosis present

## 2024-05-30 DIAGNOSIS — Z791 Long term (current) use of non-steroidal anti-inflammatories (NSAID): Secondary | ICD-10-CM | POA: Diagnosis not present

## 2024-05-30 LAB — CBC WITH DIFFERENTIAL (CANCER CENTER ONLY)
Abs Immature Granulocytes: 0.01 K/uL (ref 0.00–0.07)
Basophils Absolute: 0 K/uL (ref 0.0–0.1)
Basophils Relative: 0 %
Eosinophils Absolute: 0 K/uL (ref 0.0–0.5)
Eosinophils Relative: 0 %
HCT: 40.5 % (ref 36.0–46.0)
Hemoglobin: 14.3 g/dL (ref 12.0–15.0)
Immature Granulocytes: 0 %
Lymphocytes Relative: 35 %
Lymphs Abs: 1.6 K/uL (ref 0.7–4.0)
MCH: 31.4 pg (ref 26.0–34.0)
MCHC: 35.3 g/dL (ref 30.0–36.0)
MCV: 89 fL (ref 80.0–100.0)
Monocytes Absolute: 0.5 K/uL (ref 0.1–1.0)
Monocytes Relative: 11 %
Neutro Abs: 2.4 K/uL (ref 1.7–7.7)
Neutrophils Relative %: 54 %
Platelet Count: 175 K/uL (ref 150–400)
RBC: 4.55 MIL/uL (ref 3.87–5.11)
RDW: 12.7 % (ref 11.5–15.5)
WBC Count: 4.5 K/uL (ref 4.0–10.5)
nRBC: 0 % (ref 0.0–0.2)

## 2024-05-30 LAB — CMP (CANCER CENTER ONLY)
ALT: 26 U/L (ref 0–44)
AST: 33 U/L (ref 15–41)
Albumin: 4.3 g/dL (ref 3.5–5.0)
Alkaline Phosphatase: 129 U/L — ABNORMAL HIGH (ref 38–126)
Anion gap: 11 (ref 5–15)
BUN: 11 mg/dL (ref 8–23)
CO2: 25 mmol/L (ref 22–32)
Calcium: 9.6 mg/dL (ref 8.9–10.3)
Chloride: 94 mmol/L — ABNORMAL LOW (ref 98–111)
Creatinine: 0.75 mg/dL (ref 0.44–1.00)
GFR, Estimated: >60 mL/min (ref >60)
Glucose, Bld: 180 mg/dL — ABNORMAL HIGH (ref 70–99)
Potassium: 4.6 mmol/L (ref 3.5–5.1)
Sodium: 130 mmol/L — ABNORMAL LOW (ref 135–145)
Total Bilirubin: 0.3 mg/dL (ref 0.0–1.2)
Total Protein: 7.2 g/dL (ref 6.5–8.1)

## 2024-05-30 LAB — TOTAL PROTEIN, URINE DIPSTICK: Protein, ur: NEGATIVE mg/dL

## 2024-05-30 LAB — TSH: TSH: 4.76 u[IU]/mL — ABNORMAL HIGH (ref 0.350–4.500)

## 2024-05-30 MED ORDER — SODIUM CHLORIDE 0.9 % IV SOLN
15.0000 mg/kg | Freq: Once | INTRAVENOUS | Status: AC
  Administered 2024-05-30: 800 mg via INTRAVENOUS
  Filled 2024-05-30: qty 32

## 2024-05-30 MED ORDER — SODIUM CHLORIDE 0.9 % IV SOLN
INTRAVENOUS | Status: DC
  Filled 2024-05-30: qty 250

## 2024-05-30 MED ORDER — SODIUM CHLORIDE 0.9 % IV SOLN
1200.0000 mg | Freq: Once | INTRAVENOUS | Status: AC
  Administered 2024-05-30: 1200 mg via INTRAVENOUS
  Filled 2024-05-30: qty 20

## 2024-05-30 NOTE — Progress Notes (Signed)
 Hematology/Oncology Progress note [phone] [phone]         Patient Care Team: Melvin Pao, NP as PCP - General (Nurse Practitioner) Maurie Rayfield BIRCH, RN as Oncology Nurse Navigator Babara Call, MD as Consulting Physician (Oncology)   CHIEF COMPLAINTS/REASON FOR VISIT:   multifocal HCC   ASSESSMENT & PLAN:   Hepatocellular carcinoma (HCC) Multifocal HCC, s/p local therapy. August 2025 MRI and liver biopsy confirmed disease progression.  Recommend systemic treatment with Atezolizumab  plus bevacizumab  Previous  EGD showed no varices  Nov 2025 MRI abdomen w wo contrast showed partial response.  Labs are reviewed and discussed with patient. Proceed with cycle 3 Atezolizumab  + Bevacizumab    Encounter for antineoplastic chemotherapy Treatment as planned above.   Other cirrhosis of liver (HCC) Compensated. Monitor LFT, mild elevated LFT maybe due to immunotherapy Pending manufactor assistance approval for Rifaximin .    Orders Placed This Encounter  Procedures   CBC with Differential (Cancer Center Only)    Standing Status:   Future    Expected Date:   06/26/2024    Expiration Date:   06/26/2025   CMP (Cancer Center only)    Standing Status:   Future    Expected Date:   06/26/2024    Expiration Date:   06/26/2025   Follow up in 3 weeks . All questions were answered. The patient knows to call the clinic with any problems, questions or concerns.  Call Babara, MD, PhD [facility] Hematology Oncology 05/30/2024    HISTORY OF PRESENTING ILLNESS:   Monique Tucker is a  63 y.o.  female with PMH listed below was seen in consultation at the request of  Babara Call, MD  for evaluation of multifocal [facility]  Patient had ultrasounds done in June 2022 which showed features of liver cirrhosis and a scattered hypodense hepatic lesions largest measuring 2.4 cm. 03/22/2021, patient had a liver MRI done which showed a 3 hyperenhancing lesions of the liver.   Highly suspicious for HCC, LIRAD 5. Size of 3 lesions are 2.0 x 1.7 cm, 1.4 x 1.1 cm, 2.1 x 1.4 cm. Liver cirrhosis.  Patient was recommended by primary care provider to establish care with gastroenterology. 04/30/2021, lab work showed elevated alkaline phosphatase of 132, AST and ALT are mildly elevated.  Normal bilirubin.  05/24/2021, patient was seen by gastroenterology Dr. Therisa. Additional blood work was obtained.  AFP was elevated in the 300s. Hepatitis C antibody is positive.  Negative antimicrosomal antibody, negative HIV, negative ANA, negative mitochondrial/smooth muscle cell antibody panel.  Ferritin was elevated at 510.  Normal iron saturation.  Elevated immunoglobulins 1689, celiac panel negative.  Alpha antitrypsin elevated 221.  Patient has a history of alcohol use.  14 cans of beer per week.  She has stopped alcohol use since her visits with gastroenterology.  Current everyday smoker.  A pack a day.  She is working on smoke cessation Patient works at [employer]. She has anxiety and depression and she previously follows up with a psychiatrist.  She has not followed up with a psychiatrist recently.  Patient takes Wellbutrin, Effexor , Seroquel .  Oncology care records  was reviewed.  June 22: US  for known HCV/EtOH ESLD-> 2.4cm liver lesions 03/22/21: 3 LIRADS5 1.4-2cm on MRI; AFP 300s 06/26/21: LR5 seg 6 lesion, LR4/3 lesion seg 4, 6, 1 07/02/21: discussed LRT/TACE/TARE-> future systemic therapy 08/06/21: TACE; 03/07/22: lap ablation 08/14/21-01/26/24: followed surveillance with Dr. Erick 01/26/2024 MRI abdomen w wo contrast   Interval prominent increase in size  of segment 5 and 6, 7.4 x 3.8 x 4.9 cm Enlarged central necrotic lymph nodes in the portacaval region and aortocaval region. 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 She prefers to reestablish care and get treatment locally.    Appetite is fair.  INTERVAL HISTORY Monique Tucker is a 63 y.o. female who has above history reviewed by me today presents for follow up visit for [facility] treatments.  She missed a previous appointment due to severe diarrhea and transportation issues. She is experiencing brain fog and forgetfulness, which she associates with her liver disease. Her insurance has stopped covering rifaximin , which she believes helps with these symptoms, and she is in the process of obtaining assistance for this medication.  Diarrhea has resolved.   She is currently on metoprolol , amlodipine , and propranolol for blood pressure management. She reports a previous blood pressure reading of 200 systolic at a recent office visit at [facility], but her current reading is 147/71. She is mindful of her salt intake and has been monitoring her blood pressure closely.    Review of Systems  Constitutional:  Negative for appetite change, chills, fatigue and fever.  HENT:   Negative for hearing loss and voice change.   Eyes:  Negative for eye problems.  Respiratory:  Negative for chest tightness and cough.   Cardiovascular:  Negative for chest pain.  Gastrointestinal:  Negative for abdominal distention, abdominal pain and blood in stool.  Endocrine: Negative for hot flashes.  Genitourinary:  Negative for difficulty urinating and frequency.   Musculoskeletal:  Negative for arthralgias.  Skin:  Negative for itching and rash.  Neurological:  Negative for extremity weakness.  Hematological:  Negative for adenopathy.  Psychiatric/Behavioral:  Positive for depression. Negative for confusion. The patient is nervous/anxious.     MEDICAL HISTORY:  Past Medical History:  Diagnosis Date   Acute hepatitis C    Anxiety    AR (allergic rhinitis)    Arthritis    Bulging lumbar disc    COPD (chronic obstructive pulmonary disease)  (HCC)    mild   Depression    Hypertension    Plantar fasciitis    Restless leg syndrome, controlled    Sciatica    left    SURGICAL HISTORY: Past Surgical History:  Procedure Laterality Date   BLADDER REPAIR     CATARACT EXTRACTION W/PHACO Left 05/13/2020   Procedure: CATARACT EXTRACTION PHACO AND INTRAOCULAR LENS PLACEMENT (IOC) LEFT;  Surgeon: Mittie Gaskin, MD;  Location[facility];  Service: Ophthalmology;  Laterality: Left;  5.15 049.8 10.4%   CATARACT EXTRACTION W/PHACO Right 05/27/2020   Procedure: CATARACT EXTRACTION PHACO AND INTRAOCULAR LENS PLACEMENT (IOC) RIGHT;  Surgeon: Mittie Gaskin, MD;  Location: [facility] OR[facility];  Service: Ophthalmology;  Laterality: Right;  3.29 0:55.2 6.0%   CYSTOCELE REPAIR N/A 10/19/2015   Procedure: ANTERIOR REPAIR (CYSTOCELE);  Surgeon: Gladis DELENA Dollar, MD;  Location: [facility] OR[facility];  Service: Gynecology;  Laterality: N/A;   ESOPHAGOGASTRODUODENOSCOPY (EGD) WITH PROPOFOL  N/A 05/25/2021   Procedure: ESOPHAGOGASTRODUODENOSCOPY (EGD) WITH PROPOFOL ;  Surgeon: Therisa Bi, MD;  Location: [facility];  Service: Gastroenterology;  Laterality: N/A;   EYE SURGERY     IR IMAGING GUIDED PORT INSERTION  03/29/2024   RECTOCELE REPAIR N/A 05/02/2016   Procedure: POSTERIOR REPAIR (RECTOCELE);  Surgeon: Gladis DELENA Defrancesco,  MD;  Location: [facility];  Service: Gynecology;  Laterality: N/A;   TOTAL VAGINAL HYSTERECTOMY     TUBAL LIGATION     VAGINAL HYSTERECTOMY Bilateral 10/19/2015   Procedure: HYSTERECTOMY VAGINAL WITH BILATERAL SALPINGO-OOPHERECTOMY;  Surgeon: Gladis DELENA Dollar, MD;  Location: [facility] OR[facility];  Service: Gynecology;  Laterality: Bilateral;    SOCIAL HISTORY: Social History   Socioeconomic History   Marital status: Single    Spouse name: Not on file   Number of children: Not on file   Years of education: Not on file   Highest education level: 12th grade  Occupational History   Not on file  Tobacco Use   Smoking  status: Every Day    Current packs/day: 1.00    Types: Cigarettes   Smokeless tobacco: Never   Tobacco comments:     started age 54  Vaping Use   Vaping status: Every Day  Substance and Sexual Activity   Alcohol use: Not Currently    Comment:     Drug use: No   Sexual activity: Not Currently  Other Topics Concern   Not on file  Social History Narrative   Not on file   Social Drivers of Health   Financial Resource Strain: Medium Risk (09/26/2023)   Overall Financial Resource Strain (CARDIA)    Difficulty of Paying Living Expenses: Somewhat hard  Food Insecurity: Food Insecurity Present (09/26/2023)   Hunger Vital Sign    Worried About Running Out of Food in the Last Year: Sometimes true    Ran Out of Food in the Last Year: Never true  Transportation Needs: Unmet Transportation Needs (09/26/2023)   PRAPARE - Transportation    Lack of Transportation (Medical): Yes    Lack of Transportation (Non-Medical): Yes  Physical Activity: Unknown (09/26/2023)   Exercise Vital Sign    Days of Exercise per Week: 0 days    Minutes of Exercise per Session: Not on file  Stress: Stress Concern Present (09/26/2023)   [employer] of Occupational Health - Occupational Stress Questionnaire    Feeling of Stress : To some extent  Social Connections: Socially Isolated (09/26/2023)   Social Connection and Isolation Panel    Frequency of Communication with Friends and Family: More than three times a week    Frequency of Social Gatherings with Friends and Family: Never    Attends Religious Services: Never    Database Administrator or Organizations: No    Attends Engineer, Structural: Not on file    Marital Status: Divorced  Intimate Partner Violence: Not At Risk (02/22/2024)   Received from [facility]   Humiliation, Afraid, Rape, and Kick questionnaire    Within the last year, have you been afraid of your partner or ex-partner?: No    Within the last year, have you been humiliated or  emotionally abused in other ways by your partner or ex-partner?: No    Within the last year, have you been kicked, hit, slapped, or otherwise physically hurt by your partner or ex-partner?: No    Within the last year, have you been raped or forced to have any kind of sexual activity by your partner or ex-partner?: No    FAMILY HISTORY: Family History  Problem Relation Age of Onset   Breast cancer Mother    Pancreatic cancer Mother    Cancer Mother        breast and pancreatic   Hypertension Mother    Migraines Mother    Diabetes Mother  Heart disease Father    Mental illness Sister        depression   Heart attack Sister    Diabetes Sister    Alcohol abuse Maternal Grandfather    Cancer Maternal Grandmother        lung and brain   Mental illness Sister        depression    ALLERGIES:  is allergic to bee venom, hydralazine , other, and lisinopril .  MEDICATIONS:  Current Outpatient Medications  Medication Sig Dispense Refill   albuterol  (VENTOLIN  HFA) 108 (90 Base) MCG/ACT inhaler Inhale 2 puffs into the lungs every 6 (six) hours as needed for wheezing or shortness of breath. 18 g 3   amLODipine  (NORVASC ) 2.5 MG tablet TAKE ONE TABLET BY MOUTH ONCE A DAY. TO TAKE IN CONJUNCTION WITH FIVE MG TABLET TO = A DOSE OF 7.5 MG 90 tablet 1   amLODipine  (NORVASC ) 5 MG tablet TAKE ONE TABLET BY MOUTH ONCE A DAY. TAKE WITH THE 2.5 MG TABLET TO EQUAL A TOTAL DOSAGE OF 7.5 MG 90 tablet 1   busPIRone  (BUSPAR ) 30 MG tablet TAKE ONE TABLET (30 MG TOTAL) BY MOUTH TWO (TWO) TIMES DAILY. 60 tablet 0   cetirizine  (ZYRTEC  ALLERGY) 10 MG tablet Take 1 tablet (10 mg total) by mouth daily. 90 tablet 1   conjugated estrogens  (PREMARIN ) vaginal cream Place 1 Applicatorful vaginally daily. 42.5 g 12   diclofenac  Sodium (VOLTAREN ) 1 % GEL Apply 2 g topically 4 (four) times daily. 50 g 0   fluticasone -salmeterol (ADVAIR DISKUS) 250-50 MCG/ACT AEPB Inhale 1 puff into the lungs in the morning and at bedtime.  60 each 2   gabapentin  (NEURONTIN ) 300 MG capsule TAKE ONE CAPSULE BY MOUTH THREE TIMES A DAY 270 capsule 1   lidocaine -prilocaine  (EMLA ) cream Apply 1 Application topically as needed. Apply to port and cover with saran wrap 1-2 hours prior to port access 30 g 2   losartan  (COZAAR ) 100 MG tablet TAKE ONE TABLET (100 MG TOTAL) BY MOUTH DAILY. 90 tablet 1   Magnesium  500 MG TABS Take 500 mg by mouth daily.     Melatonin 5 MG CHEW Chew by mouth.     methocarbamol  (ROBAXIN ) 500 MG tablet TAKE ONE TABLET (500 MG TOTAL) BY MOUTH FOUR TIMES DAILY. 60 tablet 0   montelukast  (SINGULAIR ) 10 MG tablet TAKE ONE TABLET BY MOUTH AT BEDTIME 90 tablet 1   ondansetron  (ZOFRAN ) 8 MG tablet TAKE ONE TABLET (8 MG TOTAL) BY MOUTH EVERY EIGHT HOURS AS NEEDED FOR NAUSEA OR VOMITING. 90 tablet 1   OXcarbazepine  (TRILEPTAL ) 150 MG tablet Take 1 tablet (150 mg total) by mouth 2 (two) times daily. 90 tablet 1   potassium chloride  SA (KLOR-CON  M) 20 MEQ tablet TAKE ONE TABLET BY MOUTH ONCE A DAY 90 tablet 0   propranolol (INDERAL) 10 MG tablet      QUEtiapine  (SEROQUEL  XR) 200 MG 24 hr tablet Take 1 tablet (200 mg total) by mouth at bedtime. 90 tablet 1   rifaximin  (XIFAXAN ) 550 MG TABS tablet Take 1 tablet (550 mg total) by mouth 2 (two) times daily.     Tiotropium Bromide  Monohydrate (SPIRIVA  RESPIMAT) 2.5 MCG/ACT AERS INHALE 1 PUFF BY MOUTH INTO THE LUNGS DAILY 4 g 11   venlafaxine  XR (EFFEXOR -XR) 150 MG 24 hr capsule Take 1 capsule (150 mg total) by mouth daily with breakfast. 90 capsule 0   EPINEPHrine  0.3 mg/0.3 mL IJ SOAJ injection INJECT 0.3 MLS INTO THE MUSCLE ONCE (  Patient not taking: Reported on 05/30/2024) 2 each 1   methylPREDNISolone  (MEDROL  DOSEPAK) 4 MG TBPK tablet Take as directed (Patient not taking: Reported on 05/30/2024) 21 tablet 0   nitrofurantoin , macrocrystal-monohydrate, (MACROBID ) 100 MG capsule Take 1 capsule (100 mg total) by mouth 2 (two) times daily. (Patient not taking: Reported on 05/30/2024) 10  capsule 0   Varenicline  Tartrate, Starter, (CHANTIX  STARTING MONTH PAK) 0.5 MG X 11 & 1 MG X 42 TBPK Take one 0.5 mg tablet by mouth once daily for 3 days, then increase to one 0.5 mg tablet twice daily for 4 days, then increase to one 1 mg tablet twice daily. (Patient not taking: Reported on 05/30/2024) 53 each 0   Current Facility-Administered Medications  Medication Dose Route Frequency Provider Last Rate Last Admin   betamethasone  acetate-betamethasone  sodium phosphate  (CELESTONE ) injection 3 mg  3 mg Intramuscular Once Evans, Brent M, DPM       betamethasone  acetate-betamethasone  sodium phosphate  (CELESTONE ) injection 3 mg  3 mg Intramuscular Once Janit Thresa HERO, DPM       Facility-Administered Medications Ordered in Other Visits  Medication Dose Route Frequency Provider Last Rate Last Admin   0.9 %  sodium chloride  infusion   Intravenous Continuous Babara Call, MD   Stopped at 05/30/24 1125     PHYSICAL EXAMINATION: ECOG PERFORMANCE STATUS: 1 - Symptomatic but completely ambulatory Vitals:   05/30/24 0838 05/30/24 0841  BP: (!) 140/69 (!) 147/71  Pulse: 77 78  Resp: 18   Temp: (!) 97.2 F (36.2 C)    Filed Weights   05/30/24 0838  Weight: 124 lb 3.2 oz (56.3 kg)    Physical Exam Constitutional:      General: She is not in acute distress. HENT:     Head: Normocephalic and atraumatic.  Eyes:     General: No scleral icterus. Cardiovascular:     Rate and Rhythm: Normal rate and regular rhythm.     Heart sounds: Normal heart sounds.  Pulmonary:     Effort: Pulmonary effort is normal. No respiratory distress.     Breath sounds: No wheezing.  Abdominal:     General: Bowel sounds are normal. There is no distension.     Palpations: Abdomen is soft.  Musculoskeletal:        General: No deformity. Normal range of motion.     Cervical back: Normal range of motion and neck supple.  Skin:    General: Skin is warm and dry.     Findings: No erythema or rash.  Neurological:      Mental Status: She is alert and oriented to person, place, and time. Mental status is at baseline.     Cranial Nerves: No cranial nerve deficit.     Coordination: Coordination normal.  Psychiatric:        Mood and Affect: Mood normal.     LABORATORY DATA:  I have reviewed the data as listed Lab Results  Component Value Date   WBC 4.5 05/30/2024   HGB 14.3 05/30/2024   HCT 40.5 05/30/2024   MCV 89.0 05/30/2024   PLT 175 05/30/2024   Recent Labs    03/21/24 0818 04/11/24 0903 05/30/24 0753  NA 135 131* 130*  K 4.5 4.6 4.6  CL 101 98 94*  CO2 24 25 25   GLUCOSE 189* 152* 180*  BUN 12 11 11   CREATININE 0.84 0.68 0.75  CALCIUM 9.1 9.1 9.6  GFRNONAA >60 >60 >60  PROT 8.0 7.7 7.2  ALBUMIN 4.0  3.7 4.3  AST 27 51* 33  ALT 16 39 26  ALKPHOS 121 128* 129*  BILITOT 0.6 0.6 0.3   Iron/TIBC/Ferritin/ %Sat    Component Value Date/Time   IRON 62 05/24/2021 1344   TIBC 295 05/24/2021 1344   FERRITIN 510 (H) 05/24/2021 1344   IRONPCTSAT 21 05/24/2021 1344      RADIOGRAPHIC STUDIES: I have personally reviewed the radiological images as listed and agreed with the findings in the report. IR IMAGING GUIDED PORT INSERTION Result Date: 03/29/2024 CLINICAL DATA:  Hepatocellular carcinoma. Chest port placement for therapy. EXAM: Chest port catheter placement TECHNIQUE: Procedure performed using fluoroscopy and ultrasound CONTRAST:  None RADIOPHARMACEUTICALS:  None FLUOROSCOPY: 1 mGy COMPARISON:  None FINDINGS: The patient was placed in supine position on the IR gantry and the right upper chest and neck were prepped and draped in the usual sterile fashion. The nurse administered intravenous fentanyl  and Versed  under my supervision and the nurse had no other injuries other than monitoring the patient and administering medications. I was present for the entire duration of procedure. 2 mg intravenous Versed  and 100 mcg intravenous fentanyl  were administered for a total sedation time of 18  minutes. Ultrasound guidance was used to investigate the right internal jugular vein which was anechoic and compressible indicating patency. The needle was then advanced from a scan negative through the soft tissue into the right internal jugular vein under ultrasound guidance. A final image was obtained and stored in the patient's permanent medical record. Access was then exchanged over a guidewire which was advanced under fluoroscopic guidance. The needle was removed and replaced with a micropuncture sheath. Approximately 2 inches below the clavicle the port pocket was created with a subsequent incision. The catheter was then tunneled from the port pocket to the venotomy site overlying the right internal jugular vein. Access was then exchanged over an 035 guidewire for peel-away sheath which was advanced over the guidewire under fluoroscopic guidance. The catheter was then advanced through the peel-away sheath. Sheath was removed. The catheter was then cut at the port pocket and connected to chest port. The chest port was tested for function and finally function well. The chest port was then flushed with heparin  and a port pocket was closed with 4-0 suture. Final image was obtained demonstrating satisfactory position of chest port. The final count of all materials was satisfactory. IMPRESSION: 1. Satisfactory placement of right internal jugular vein single-lumen chest port. The catheter tip is at the cavoatrial junction. 2.  Okay to use and power inject chest port. Electronically Signed   By: Cordella Banner   On: 03/29/2024 16:09

## 2024-05-30 NOTE — Progress Notes (Signed)
 Patient here for follow-up. Pt reports that she was concerned because she had lost her taste and its coming.

## 2024-05-30 NOTE — Assessment & Plan Note (Signed)
 Compensated. Monitor LFT, mild elevated LFT maybe due to immunotherapy Pending manufactor assistance approval for Rifaximin .

## 2024-05-30 NOTE — Patient Instructions (Signed)
 [facility] - A DEPT OF [facility]  Discharge Instructions: Thank you for choosing [facility] to provide your oncology and hematology care.  If you have a lab appointment with the Cancer Center, please go directly to the Cancer Center and check in at the registration area.  Wear comfortable clothing and clothing appropriate for easy access to any Portacath or PICC line.   We strive to give you quality time with your provider. You may need to reschedule your appointment if you arrive late (15 or more minutes).  Arriving late affects you and other patients whose appointments are after yours.  Also, if you miss three or more appointments without notifying the office, you may be dismissed from the clinic at the provider's discretion.      For prescription refill requests, have your pharmacy contact our office and allow 72 hours for refills to be completed.    Today you received the following chemotherapy and/or immunotherapy agents- tecentriq , bevacizumab       To help prevent nausea and vomiting after your treatment, we encourage you to take your nausea medication as directed.  BELOW ARE SYMPTOMS THAT SHOULD BE REPORTED IMMEDIATELY: *FEVER GREATER THAN 100.4 F (38 C) OR HIGHER *CHILLS OR SWEATING *NAUSEA AND VOMITING THAT IS NOT CONTROLLED WITH YOUR NAUSEA MEDICATION *UNUSUAL SHORTNESS OF BREATH *UNUSUAL BRUISING OR BLEEDING *URINARY PROBLEMS (pain or burning when urinating, or frequent urination) *BOWEL PROBLEMS (unusual diarrhea, constipation, pain near the anus) TENDERNESS IN MOUTH AND THROAT WITH OR WITHOUT PRESENCE OF ULCERS (sore throat, sores in mouth, or a toothache) UNUSUAL RASH, SWELLING OR PAIN  UNUSUAL VAGINAL DISCHARGE OR ITCHING   Items with * indicate a potential emergency and should be followed up as soon as possible or go to the Emergency Department if any problems should occur.  Please show the CHEMOTHERAPY ALERT CARD or  IMMUNOTHERAPY ALERT CARD at check-in to the Emergency Department and triage nurse.  Should you have questions after your visit or need to cancel or reschedule your appointment, please contact [facility] - A DEPT OF [facility]  [phone] and follow the prompts.  Office hours are 8:00 a.m. to 4:30 p.m. Monday - Friday. Please note that voicemails left after 4:00 p.m. may not be returned until the following business day.  We are closed weekends and major holidays. You have access to a nurse at all times for urgent questions. Please call the main number to the clinic [phone] and follow the prompts.  For any non-urgent questions, you may also contact your provider using MyChart. We now offer e-Visits for anyone 61 and older to request care online for non-urgent symptoms. For details visit [email or web address].   Also download the MyChart app! Go to the app store, search MyChart, open the app, select [facility], and log in with your MyChart username and password.

## 2024-05-30 NOTE — Assessment & Plan Note (Signed)
 Multifocal HCC, s/p local therapy. August 2025 MRI and liver biopsy confirmed disease progression.  Recommend systemic treatment with Atezolizumab  plus bevacizumab  Previous  EGD showed no varices  Nov 2025 MRI abdomen w wo contrast showed partial response.  Labs are reviewed and discussed with patient. Proceed with cycle 3 Atezolizumab  + Bevacizumab 

## 2024-05-30 NOTE — Assessment & Plan Note (Signed)
Treatment as planned above. 

## 2024-05-31 ENCOUNTER — Other Ambulatory Visit: Payer: Self-pay

## 2024-05-31 LAB — T4: T4, Total: 5.6 ug/dL (ref 4.5–12.0)

## 2024-06-01 NOTE — Telephone Encounter (Signed)
 Courtesy refill. Patient will need an office visit for additional refills.  Requested Prescriptions  Pending Prescriptions Disp Refills   montelukast  (SINGULAIR ) 10 MG tablet [Pharmacy Med Name: MONTELUKAST  SODIUM 10MG  TABLET] 30 tablet 0    Sig: TAKE ONE TABLET BY MOUTH AT BEDTIME     Pulmonology:  Leukotriene Inhibitors Passed - 06/01/2024  7:34 PM      Passed - Valid encounter within last 12 months    Recent Outpatient Visits           5 months ago Bipolar 1 disorder [facility])   [facility] Melvin Pao, NP   6 months ago Bipolar 1 disorder [facility])   [place] [facility] Melvin Pao, NP   8 months ago Bipolar 1 disorder [facility])   [place] [facility] Melvin Pao, NP   8 months ago Acute low back pain without sciatica, unspecified back pain laterality   [place] [facility] Melvin Pao, NP

## 2024-06-02 ENCOUNTER — Other Ambulatory Visit: Payer: Self-pay

## 2024-06-04 NOTE — Telephone Encounter (Unsigned)
 Copied from CRM #[id number]. Topic: Return Scheduling - Cancel/Reschedule  >> Jun 04, 2024  8:36 AM Harlene NOVAK wrote:      Hi,    Patient Robin Velez contacted the Communication Center to reschedule their appointment for tomorrow.  The original appointment has been cancelled.    Cancellation Reason: scheduling conflict    Patient has been rescheduled for 12/29.    Oncology Return Appointment Symptoms Y N: NO - Send to return scheduler     Thank you,  Harlene Stager  [facility] Communication Center   [phone]

## 2024-06-04 NOTE — Telephone Encounter (Signed)
 Copied from CRM #[id number]. Topic: Care Management - General Questions  >> Jun 04, 2024  8:40 AM Harlene NOVAK wrote:  Erskin Amato contacted the Communication Center regarding the following:    - is calling wanting to know why she needs to come see Dr. Casey so soon this month when she gets her scans and labs done closer to her with [facility], she is wanting to understand the reasoning for the appt    Please contact Sandi at [phone].    Thanks in advance,    Harlene Stager  [facility] Communication Center   [phone]

## 2024-06-04 NOTE — Telephone Encounter (Signed)
 I spoke with patient Robin Velez to confirm appointments on the following date(s): R/S December appointment with Dr. Casey to 07/29/24    Dena ONEIDA Dage

## 2024-06-12 ENCOUNTER — Other Ambulatory Visit: Payer: Self-pay | Admitting: Nurse Practitioner

## 2024-06-12 DIAGNOSIS — K7682 Hepatic encephalopathy    (CMS-HCC): Principal | ICD-10-CM

## 2024-06-12 MED ORDER — RIFAXIMIN 550 MG TABLET
ORAL_TABLET | Freq: Two times a day (BID) | ORAL | 10 refills | 30.00000 days | Status: CP
Start: 2024-06-12 — End: 2025-05-08

## 2024-06-12 NOTE — Telephone Encounter (Signed)
 Xifaxan  script sent to [facility] as patient was approved for [employer].

## 2024-06-12 NOTE — Telephone Encounter (Unsigned)
 Copied from CRM #[id number]. Topic: Clinical - Medication Refill >> Jun 12, 2024 11:55 AM Emylou G wrote: Medication: Varenicline  Tartrate, Starter, (CHANTIX  STARTING MONTH PAK) 0.5 MG X 11 & 1 MG X 42 TBPK  Has the patient contacted their pharmacy? No (Agent: If no, request that the patient contact the pharmacy for the refill. If patient does not wish to contact the pharmacy document the reason why and proceed with request.) (Agent: If yes, when and what did the pharmacy advise?)  This is the patient's preferred pharmacy:  [facility] - [place], [place] - [address] [place] [address] Phone: [phone] Fax: [phone]  Is this the correct pharmacy for this prescription? Yes If no, delete pharmacy and type the correct one.   Has the prescription been filled recently? No  Is the patient out of the medication? Yes  Has the patient been seen for an appointment in the last year OR does the patient have an upcoming appointment? Yes  Can we respond through MyChart? Yes  Agent: Please be advised that Rx refills may take up to 3 business days. We ask that you follow-up with your pharmacy.

## 2024-06-14 NOTE — Telephone Encounter (Signed)
 Requested medication (s) are due for refill today: Yes  Requested medication (s) are on the active medication list: Yes  Last refill:  12/20/23  Future visit scheduled: Yes  Notes to clinic:  Unable to refill per protocol due to tapering dose.      Requested Prescriptions  Pending Prescriptions Disp Refills   Varenicline  Tartrate, Starter, (CHANTIX  STARTING MONTH PAK) 0.5 MG X 11 & 1 MG X 42 TBPK 53 each 0    Sig: Take one 0.5 mg tablet by mouth once daily for 3 days, then increase to one 0.5 mg tablet twice daily for 4 days, then increase to one 1 mg tablet twice daily.     Psychiatry:  Drug Dependence Therapy - varenicline  Failed - 06/14/2024  9:35 PM      Failed - Manual Review: Do not refill starter pack. 1 mg tabs may be extended up to one year if the patient has quit smoking but still feels at risk for relapse.      Passed - Cr in normal range and within 180 days    Creatinine  Date Value Ref Range Status  05/30/2024 0.75 0.44 - 1.00 mg/dL Final         Passed - Completed PHQ-2 or PHQ-9 in the last 360 days      Passed - Valid encounter within last 6 months    Recent Outpatient Visits           5 months ago Bipolar 1 disorder [facility])   [facility] Melvin Pao, NP   7 months ago Bipolar 1 disorder [facility])   [facility] Melvin Pao, NP   8 months ago Bipolar 1 disorder [facility])   [facility] Melvin Pao, NP   8 months ago Acute low back pain without sciatica, unspecified back pain laterality    [facility] Melvin Pao, NP

## 2024-06-17 ENCOUNTER — Ambulatory Visit

## 2024-06-17 ENCOUNTER — Other Ambulatory Visit: Payer: Self-pay | Admitting: Nurse Practitioner

## 2024-06-21 ENCOUNTER — Other Ambulatory Visit: Payer: Self-pay | Admitting: Nurse Practitioner

## 2024-06-23 ENCOUNTER — Other Ambulatory Visit: Payer: Self-pay

## 2024-06-24 ENCOUNTER — Other Ambulatory Visit: Payer: Self-pay | Admitting: Nurse Practitioner

## 2024-06-25 NOTE — Telephone Encounter (Signed)
"  error  "

## 2024-06-26 ENCOUNTER — Encounter: Payer: Self-pay | Admitting: Oncology

## 2024-06-26 ENCOUNTER — Inpatient Hospital Stay

## 2024-06-26 ENCOUNTER — Inpatient Hospital Stay (HOSPITAL_BASED_OUTPATIENT_CLINIC_OR_DEPARTMENT_OTHER): Admitting: Oncology

## 2024-06-26 VITALS — BP 166/60 | HR 72

## 2024-06-26 VITALS — BP 176/73 | HR 92 | Temp 98.8°F | Ht 64.0 in | Wt 121.0 lb

## 2024-06-26 DIAGNOSIS — C220 Liver cell carcinoma: Secondary | ICD-10-CM | POA: Diagnosis not present

## 2024-06-26 DIAGNOSIS — Z5111 Encounter for antineoplastic chemotherapy: Secondary | ICD-10-CM | POA: Diagnosis not present

## 2024-06-26 DIAGNOSIS — K7682 Hepatic encephalopathy: Secondary | ICD-10-CM

## 2024-06-26 DIAGNOSIS — K7469 Other cirrhosis of liver: Secondary | ICD-10-CM | POA: Diagnosis not present

## 2024-06-26 DIAGNOSIS — Z5112 Encounter for antineoplastic immunotherapy: Secondary | ICD-10-CM | POA: Diagnosis not present

## 2024-06-26 LAB — CBC WITH DIFFERENTIAL (CANCER CENTER ONLY)
Abs Immature Granulocytes: 0.01 K/uL (ref 0.00–0.07)
Basophils Absolute: 0 K/uL (ref 0.0–0.1)
Basophils Relative: 0 %
Eosinophils Absolute: 0.1 K/uL (ref 0.0–0.5)
Eosinophils Relative: 1 %
HCT: 41.8 % (ref 36.0–46.0)
Hemoglobin: 14.6 g/dL (ref 12.0–15.0)
Immature Granulocytes: 0 %
Lymphocytes Relative: 41 %
Lymphs Abs: 1.9 K/uL (ref 0.7–4.0)
MCH: 31.9 pg (ref 26.0–34.0)
MCHC: 34.9 g/dL (ref 30.0–36.0)
MCV: 91.5 fL (ref 80.0–100.0)
Monocytes Absolute: 0.5 K/uL (ref 0.1–1.0)
Monocytes Relative: 9 %
Neutro Abs: 2.3 K/uL (ref 1.7–7.7)
Neutrophils Relative %: 49 %
Platelet Count: 168 K/uL (ref 150–400)
RBC: 4.57 MIL/uL (ref 3.87–5.11)
RDW: 13.2 % (ref 11.5–15.5)
WBC Count: 4.8 K/uL (ref 4.0–10.5)
nRBC: 0 % (ref 0.0–0.2)

## 2024-06-26 LAB — CMP (CANCER CENTER ONLY)
ALT: 16 U/L (ref 0–44)
AST: 28 U/L (ref 15–41)
Albumin: 4.2 g/dL (ref 3.5–5.0)
Alkaline Phosphatase: 109 U/L (ref 38–126)
Anion gap: 11 (ref 5–15)
BUN: 10 mg/dL (ref 8–23)
CO2: 25 mmol/L (ref 22–32)
Calcium: 9.4 mg/dL (ref 8.9–10.3)
Chloride: 103 mmol/L (ref 98–111)
Creatinine: 0.7 mg/dL (ref 0.44–1.00)
GFR, Estimated: >60 mL/min
Glucose, Bld: 123 mg/dL — ABNORMAL HIGH (ref 70–99)
Potassium: 4.4 mmol/L (ref 3.5–5.1)
Sodium: 138 mmol/L (ref 135–145)
Total Bilirubin: 0.2 mg/dL (ref 0.0–1.2)
Total Protein: 7 g/dL (ref 6.5–8.1)

## 2024-06-26 LAB — TOTAL PROTEIN, URINE DIPSTICK: Protein, ur: NEGATIVE mg/dL

## 2024-06-26 MED ORDER — SODIUM CHLORIDE 0.9 % IV SOLN
INTRAVENOUS | Status: DC
  Filled 2024-06-26: qty 250

## 2024-06-26 MED ORDER — SODIUM CHLORIDE 0.9 % IV SOLN
1200.0000 mg | Freq: Once | INTRAVENOUS | Status: AC
  Administered 2024-06-26: 1200 mg via INTRAVENOUS
  Filled 2024-06-26: qty 20

## 2024-06-26 MED ORDER — SODIUM CHLORIDE 0.9 % IV SOLN
15.0000 mg/kg | Freq: Once | INTRAVENOUS | Status: AC
  Administered 2024-06-26: 800 mg via INTRAVENOUS
  Filled 2024-06-26: qty 16

## 2024-06-26 NOTE — Progress Notes (Signed)
 " Hematology/Oncology Progress note [phone] [phone]         Patient Care Team: Melvin Pao, NP as PCP - General (Nurse Practitioner) Maurie Rayfield BIRCH, RN as Oncology Nurse Navigator Babara Call, MD as Consulting Physician (Oncology)   CHIEF COMPLAINTS/REASON FOR VISIT:   multifocal HCC   ASSESSMENT & PLAN:   Hepatocellular carcinoma (HCC) Multifocal HCC, s/p local therapy. August 2025 MRI and liver biopsy confirmed disease progression.  Recommend systemic treatment with Atezolizumab  plus bevacizumab  Previous  EGD showed no varices  Nov 2025 MRI abdomen w wo contrast at [facility] showed partial response.  Labs are reviewed and discussed with patient. Proceed with cycle 4 Atezolizumab  + Bevacizumab    Encounter for antineoplastic chemotherapy Treatment as planned above.   Other cirrhosis of liver (HCC) Compensated. Monitor LFT, mild elevated LFT maybe due to immunotherapy Pending manufactor assistance approval for Rifaximin .   Hepatic encephalopathy (HCC) managed with rifaximin .  Has not tolerated lactulose in past.    Orders Placed This Encounter  Procedures   Total Protein, Urine dipstick    Standing Status:   Future    Number of Occurrences:   1    Expected Date:   06/26/2024    Expiration Date:   06/26/2025   CBC with Differential (Cancer Center Only)    Standing Status:   Future    Expected Date:   07/17/2024    Expiration Date:   07/17/2025   CMP (Cancer Center only)    Standing Status:   Future    Expected Date:   07/17/2024    Expiration Date:   07/17/2025   T4    Standing Status:   Future    Expected Date:   07/17/2024    Expiration Date:   07/17/2025   TSH    Standing Status:   Future    Expected Date:   07/17/2024    Expiration Date:   07/17/2025   Total Protein, Urine dipstick    Standing Status:   Future    Expected Date:   07/17/2024    Expiration Date:   07/17/2025   Follow up in 3 weeks . All questions were answered.  The patient knows to call the clinic with any problems, questions or concerns.  Call Babara, MD, PhD [facility] Hematology Oncology 06/26/2024    HISTORY OF PRESENTING ILLNESS:   Monique Tucker is a  63 y.o.  female with PMH listed below was seen in consultation at the request of  Babara Call, MD  for evaluation of multifocal [facility]  Patient had ultrasounds done in June 2022 which showed features of liver cirrhosis and a scattered hypodense hepatic lesions largest measuring 2.4 cm. 03/22/2021, patient had a liver MRI done which showed a 3 hyperenhancing lesions of the liver.  Highly suspicious for HCC, LIRAD 5. Size of 3 lesions are 2.0 x 1.7 cm, 1.4 x 1.1 cm, 2.1 x 1.4 cm. Liver cirrhosis.  Patient was recommended by primary care provider to establish care with gastroenterology. 04/30/2021, lab work showed elevated alkaline phosphatase of 132, AST and ALT are mildly elevated.  Normal bilirubin.  05/24/2021, patient was seen by gastroenterology Dr. Therisa. Additional blood work was obtained.  AFP was elevated in the 300s. Hepatitis C antibody is positive.  Negative antimicrosomal antibody, negative HIV, negative ANA, negative mitochondrial/smooth muscle cell antibody panel.  Ferritin was elevated at 510.  Normal iron saturation.  Elevated immunoglobulins 1689, celiac panel negative.  Alpha antitrypsin elevated 221.  Patient has  a history of alcohol use.  14 cans of beer per week.  She has stopped alcohol use since her visits with gastroenterology.  Current everyday smoker.  A pack a day.  She is working on smoke cessation Patient works at [employer]. She has anxiety and depression and she previously follows up with a psychiatrist.  She has not followed up with a psychiatrist recently.  Patient takes Wellbutrin, Effexor , Seroquel .  Oncology care records  was reviewed.  June 22: US  for known HCV/EtOH ESLD-> 2.4cm liver lesions 03/22/21: 3 LIRADS5 1.4-2cm on MRI; AFP 300s 06/26/21: LR5 seg 6  lesion, LR4/3 lesion seg 4, 6, 1 07/02/21: discussed LRT/TACE/TARE-> future systemic therapy 08/06/21: TACE; 03/07/22: lap ablation 08/14/21-01/26/24: followed surveillance with Dr. Erick 01/26/2024 MRI abdomen w wo contrast   Interval prominent increase in size of segment 5 and 6, 7.4 x 3.8 x 4.9 cm Enlarged central necrotic lymph nodes in the portacaval region and aortocaval region. Treated lesion located in segment 6 and 7 is not visualized    02/23/24: liver carcinoma on bx; no varices on EGD; BCLC-A->met ot lymph node She was recommended to start first line systemic treatment with Atezolizumab  plus bevacizumab . She prefers to reestablish care and get treatment locally.    Appetite is fair.  INTERVAL HISTORY Monique Tucker is a 63 y.o. female who has above history reviewed by me today presents for follow up visit for [facility] treatments.   She reports no new complaints today    Review of Systems  Constitutional:  Negative for appetite change, chills, fatigue and fever.  HENT:   Negative for hearing loss and voice change.   Eyes:  Negative for eye problems.  Respiratory:  Negative for chest tightness and cough.   Cardiovascular:  Negative for chest pain.  Gastrointestinal:  Negative for abdominal distention, abdominal pain and blood in stool.  Endocrine: Negative for hot flashes.  Genitourinary:  Negative for difficulty urinating and frequency.   Musculoskeletal:  Negative for arthralgias.  Skin:  Negative for itching and rash.  Neurological:  Negative for extremity weakness.  Hematological:  Negative for adenopathy.  Psychiatric/Behavioral:  Positive for depression. Negative for confusion. The patient is not nervous/anxious.     MEDICAL HISTORY:  Past Medical History:  Diagnosis Date   Acute hepatitis C    Anxiety    AR (allergic rhinitis)    Arthritis    Bulging lumbar disc    COPD (chronic obstructive pulmonary disease) (HCC)    mild   Depression    Hypertension     Plantar fasciitis    Restless leg syndrome, controlled    Sciatica    left    SURGICAL HISTORY: Past Surgical History:  Procedure Laterality Date   BLADDER REPAIR     CATARACT EXTRACTION W/PHACO Left 05/13/2020   Procedure: CATARACT EXTRACTION PHACO AND INTRAOCULAR LENS PLACEMENT (IOC) LEFT;  Surgeon: Mittie Gaskin, MD;  [facility];  Service: Ophthalmology;  Laterality: Left;  5.15 049.8 10.4%   CATARACT EXTRACTION W/PHACO Right 05/27/2020   Procedure: CATARACT EXTRACTION PHACO AND INTRAOCULAR LENS PLACEMENT (IOC) RIGHT;  Surgeon: Mittie Gaskin, MD;  Location: [facility];  Service: Ophthalmology;  Laterality: Right;  3.29 0:55.2 6.0%   CYSTOCELE REPAIR N/A 10/19/2015   Procedure: ANTERIOR REPAIR (CYSTOCELE);  Surgeon: Gladis DELENA Dollar, MD;  Location: [facility];  Service: Gynecology;  Laterality: N/A;   ESOPHAGOGASTRODUODENOSCOPY (EGD) WITH PROPOFOL  N/A 05/25/2021   Procedure: ESOPHAGOGASTRODUODENOSCOPY (EGD) WITH PROPOFOL ;  Surgeon: Therisa Bi, MD;  Location: [facility];  Service: Gastroenterology;  Laterality: N/A;   EYE SURGERY     IR IMAGING GUIDED PORT INSERTION  03/29/2024   RECTOCELE REPAIR N/A 05/02/2016   Procedure: POSTERIOR REPAIR (RECTOCELE);  Surgeon: Gladis DELENA Dollar, MD;  Location: [facility];  Service: Gynecology;  Laterality: N/A;   TOTAL VAGINAL HYSTERECTOMY     TUBAL LIGATION     VAGINAL HYSTERECTOMY Bilateral 10/19/2015   Procedure: HYSTERECTOMY VAGINAL WITH BILATERAL SALPINGO-OOPHERECTOMY;  Surgeon: Gladis DELENA Dollar, MD;  Location: [facility];  Service: Gynecology;  Laterality: Bilateral;    SOCIAL HISTORY: Social History   Socioeconomic History   Marital status: Single    Spouse name: Not on file   Number of children: Not on file   Years of education: Not on file   Highest education level: 12th grade  Occupational History   Not on file  Tobacco Use   Smoking status: Every Day    Current packs/day: 1.00     Types: Cigarettes   Smokeless tobacco: Never   Tobacco comments:     started age 89  Vaping Use   Vaping status: Every Day  Substance and Sexual Activity   Alcohol use: Not Currently    Comment:     Drug use: No   Sexual activity: Not Currently  Other Topics Concern   Not on file  Social History Narrative   Not on file   Social Drivers of Health   Tobacco Use: High Risk (06/26/2024)   Patient History    Smoking Tobacco Use: Every Day    Smokeless Tobacco Use: Never    Passive Exposure: Not on file  Financial Resource Strain: Medium Risk (06/17/2024)   Overall Financial Resource Strain (CARDIA)    Difficulty of Paying Living Expenses: Somewhat hard  Food Insecurity: Food Insecurity Present (06/17/2024)   Epic    Worried About Programme Researcher, Broadcasting/film/video in the Last Year: Sometimes true    Ran Out of Food in the Last Year: Sometimes true  Transportation Needs: Unmet Transportation Needs (06/17/2024)   Epic    Lack of Transportation (Medical): Yes    Lack of Transportation (Non-Medical): Yes  Physical Activity: Inactive (06/17/2024)   Exercise Vital Sign    Days of Exercise per Week: 0 days    Minutes of Exercise per Session: Not on file  Stress: Stress Concern Present (06/17/2024)   [employer] of Occupational Health - Occupational Stress Questionnaire    Feeling of Stress: To some extent  Social Connections: Socially Isolated (06/17/2024)   Social Connection and Isolation Panel    Frequency of Communication with Friends and Family: More than three times a week    Frequency of Social Gatherings with Friends and Family: Never    Attends Religious Services: Never    Database Administrator or Organizations: No    Attends Engineer, Structural: Not on file    Marital Status: Divorced  Intimate Partner Violence: Not At Risk (02/22/2024)   Received [facility]   Epic    Within the last year, have you been afraid of your partner or ex-partner?: No    Within  the last year, have you been humiliated or emotionally abused in other ways by your partner or ex-partner?: No    Within the last year, have you been kicked, hit, slapped, or otherwise physically hurt by your partner or ex-partner?: No    Within the last year, have you been raped or forced to have any  kind of sexual activity by your partner or ex-partner?: No  Depression (PHQ2-9): Low Risk (06/26/2024)   Depression (PHQ2-9)    PHQ-2 Score: 0  Alcohol Screen: Not on file  Housing: Unknown (06/17/2024)   Epic    Unable to Pay for Housing in the Last Year: No    Number of Times Moved in the Last Year: Not on file    Homeless in the Last Year: No  Utilities: Not on file  Health Literacy: Not on file    FAMILY HISTORY: Family History  Problem Relation Age of Onset   Breast cancer Mother    Pancreatic cancer Mother    Cancer Mother        breast and pancreatic   Hypertension Mother    Migraines Mother    Diabetes Mother    Heart disease Father    Mental illness Sister        depression   Heart attack Sister    Diabetes Sister    Alcohol abuse Maternal Grandfather    Cancer Maternal Grandmother        lung and brain   Mental illness Sister        depression    ALLERGIES:  is allergic to bee venom, hydralazine , other, and lisinopril .  MEDICATIONS:  Current Outpatient Medications  Medication Sig Dispense Refill   albuterol  (VENTOLIN  HFA) 108 (90 Base) MCG/ACT inhaler Inhale 2 puffs into the lungs every 6 (six) hours as needed for wheezing or shortness of breath. 18 g 3   amLODipine  (NORVASC ) 2.5 MG tablet TAKE ONE TABLET BY MOUTH ONCE A DAY. TO TAKE IN CONJUNCTION WITH FIVE MG TABLET TO = A DOSE OF 7.5 MG 30 tablet 1   amLODipine  (NORVASC ) 5 MG tablet TAKE ONE TABLET BY MOUTH ONCE A DAY. TAKE WITH THE 2.5 MG TABLET TO EQUAL A TOTAL DOSAGE OF 7.5 MG 90 tablet 1   busPIRone  (BUSPAR ) 30 MG tablet TAKE ONE TABLET (30 MG TOTAL) BY MOUTH TWO (TWO) TIMES DAILY. 60 tablet 0   cetirizine   (ZYRTEC  ALLERGY) 10 MG tablet Take 1 tablet (10 mg total) by mouth daily. 90 tablet 1   EPINEPHrine  0.3 mg/0.3 mL IJ SOAJ injection INJECT 0.3 MLS INTO THE MUSCLE ONCE 2 each 1   fluticasone -salmeterol (ADVAIR DISKUS) 250-50 MCG/ACT AEPB Inhale 1 puff into the lungs in the morning and at bedtime. 60 each 2   gabapentin  (NEURONTIN ) 300 MG capsule TAKE ONE CAPSULE BY MOUTH THREE TIMES A DAY 270 capsule 1   lidocaine -prilocaine  (EMLA ) cream Apply 1 Application topically as needed. Apply to port and cover with saran wrap 1-2 hours prior to port access 30 g 2   losartan  (COZAAR ) 100 MG tablet TAKE ONE TABLET (100 MG TOTAL) BY MOUTH DAILY. 30 tablet 0   Magnesium  500 MG TABS Take 500 mg by mouth daily.     Melatonin 5 MG CHEW Chew by mouth.     methocarbamol  (ROBAXIN ) 500 MG tablet TAKE ONE TABLET (500 MG TOTAL) BY MOUTH FOUR TIMES DAILY. 60 tablet 0   montelukast  (SINGULAIR ) 10 MG tablet TAKE ONE TABLET BY MOUTH AT BEDTIME 30 tablet 0   ondansetron  (ZOFRAN ) 8 MG tablet TAKE ONE TABLET (8 MG TOTAL) BY MOUTH EVERY EIGHT HOURS AS NEEDED FOR NAUSEA OR VOMITING. 90 tablet 1   OXcarbazepine  (TRILEPTAL ) 150 MG tablet Take 1 tablet (150 mg total) by mouth 2 (two) times daily. 90 tablet 1   potassium chloride  SA (KLOR-CON  M) 20 MEQ tablet TAKE  ONE TABLET BY MOUTH ONCE A DAY 90 tablet 0   propranolol (INDERAL) 10 MG tablet      QUEtiapine  (SEROQUEL  XR) 200 MG 24 hr tablet Take 1 tablet (200 mg total) by mouth at bedtime. 90 tablet 1   rifaximin  (XIFAXAN ) 550 MG TABS tablet Take 1 tablet (550 mg total) by mouth 2 (two) times daily.     Tiotropium Bromide  Monohydrate (SPIRIVA  RESPIMAT) 2.5 MCG/ACT AERS INHALE 1 PUFF BY MOUTH INTO THE LUNGS DAILY 4 g 11   venlafaxine  XR (EFFEXOR -XR) 150 MG 24 hr capsule Take 1 capsule (150 mg total) by mouth daily with breakfast. 90 capsule 0   conjugated estrogens  (PREMARIN ) vaginal cream Place 1 Applicatorful vaginally daily. (Patient not taking: Reported on 06/26/2024) 42.5 g 12    diclofenac  Sodium (VOLTAREN ) 1 % GEL Apply 2 g topically 4 (four) times daily. (Patient not taking: Reported on 06/26/2024) 50 g 0   Varenicline  Tartrate, Starter, (CHANTIX  STARTING MONTH PAK) 0.5 MG X 11 & 1 MG X 42 TBPK Take one 0.5 mg tablet by mouth once daily for 3 days, then increase to one 0.5 mg tablet twice daily for 4 days, then increase to one 1 mg tablet twice daily. (Patient not taking: Reported on 06/26/2024) 53 each 0   Current Facility-Administered Medications  Medication Dose Route Frequency Provider Last Rate Last Admin   betamethasone  acetate-betamethasone  sodium phosphate  (CELESTONE ) injection 3 mg  3 mg Intramuscular Once Evans, Brent M, DPM       betamethasone  acetate-betamethasone  sodium phosphate  (CELESTONE ) injection 3 mg  3 mg Intramuscular Once Janit Thresa HERO, DPM       Facility-Administered Medications Ordered in Other Visits  Medication Dose Route Frequency Provider Last Rate Last Admin   0.9 %  sodium chloride  infusion   Intravenous Continuous Babara Call, MD   Stopped at 06/26/24 1101     PHYSICAL EXAMINATION: ECOG PERFORMANCE STATUS: 1 - Symptomatic but completely ambulatory Vitals:   06/26/24 0826 06/26/24 0839  BP: (!) 175/72 (!) 176/73  Pulse: 92   Temp: 98.8 F (37.1 C)   SpO2: 99%    Filed Weights   06/26/24 0826  Weight: 121 lb (54.9 kg)    Physical Exam Constitutional:      General: She is not in acute distress. HENT:     Head: Normocephalic and atraumatic.  Eyes:     General: No scleral icterus. Cardiovascular:     Rate and Rhythm: Normal rate and regular rhythm.     Heart sounds: Normal heart sounds.  Pulmonary:     Effort: Pulmonary effort is normal. No respiratory distress.     Breath sounds: No wheezing.  Abdominal:     General: Bowel sounds are normal. There is no distension.     Palpations: Abdomen is soft.  Musculoskeletal:        General: No deformity. Normal range of motion.     Cervical back: Normal range of motion and  neck supple.  Skin:    General: Skin is warm and dry.     Findings: No erythema or rash.  Neurological:     Mental Status: She is alert and oriented to person, place, and time. Mental status is at baseline.     Cranial Nerves: No cranial nerve deficit.     Coordination: Coordination normal.     LABORATORY DATA:  I have reviewed the data as listed Lab Results  Component Value Date   WBC 4.8 06/26/2024   HGB 14.6 06/26/2024  HCT 41.8 06/26/2024   MCV 91.5 06/26/2024   PLT 168 06/26/2024   Recent Labs    04/11/24 0903 05/30/24 0753 06/26/24 0816  NA 131* 130* 138  K 4.6 4.6 4.4  CL 98 94* 103  CO2 25 25 25   GLUCOSE 152* 180* 123*  BUN 11 11 10   CREATININE 0.68 0.75 0.70  CALCIUM 9.1 9.6 9.4  GFRNONAA >60 >60 >60  PROT 7.7 7.2 7.0  ALBUMIN 3.7 4.3 4.2  AST 51* 33 28  ALT 39 26 16  ALKPHOS 128* 129* 109  BILITOT 0.6 0.3 0.2   Iron/TIBC/Ferritin/ %Sat    Component Value Date/Time   IRON 62 05/24/2021 1344   TIBC 295 05/24/2021 1344   FERRITIN 510 (H) 05/24/2021 1344   IRONPCTSAT 21 05/24/2021 1344      RADIOGRAPHIC STUDIES: I have personally reviewed the radiological images as listed and agreed with the findings in the report. IR IMAGING GUIDED PORT INSERTION Result Date: 03/29/2024 CLINICAL DATA:  Hepatocellular carcinoma. Chest port placement for therapy. EXAM: Chest port catheter placement TECHNIQUE: Procedure performed using fluoroscopy and ultrasound CONTRAST:  None RADIOPHARMACEUTICALS:  None FLUOROSCOPY: 1 mGy COMPARISON:  None FINDINGS: The patient was placed in supine position on the IR gantry and the right upper chest and neck were prepped and draped in the usual sterile fashion. The nurse administered intravenous fentanyl  and Versed  under my supervision and the nurse had no other injuries other than monitoring the patient and administering medications. I was present for the entire duration of procedure. 2 mg intravenous Versed  and 100 mcg intravenous  fentanyl  were administered for a total sedation time of 18 minutes. Ultrasound guidance was used to investigate the right internal jugular vein which was anechoic and compressible indicating patency. The needle was then advanced from a scan negative through the soft tissue into the right internal jugular vein under ultrasound guidance. A final image was obtained and stored in the patient's permanent medical record. Access was then exchanged over a guidewire which was advanced under fluoroscopic guidance. The needle was removed and replaced with a micropuncture sheath. Approximately 2 inches below the clavicle the port pocket was created with a subsequent incision. The catheter was then tunneled from the port pocket to the venotomy site overlying the right internal jugular vein. Access was then exchanged over an 035 guidewire for peel-away sheath which was advanced over the guidewire under fluoroscopic guidance. The catheter was then advanced through the peel-away sheath. Sheath was removed. The catheter was then cut at the port pocket and connected to chest port. The chest port was tested for function and finally function well. The chest port was then flushed with heparin  and a port pocket was closed with 4-0 suture. Final image was obtained demonstrating satisfactory position of chest port. The final count of all materials was satisfactory. IMPRESSION: 1. Satisfactory placement of right internal jugular vein single-lumen chest port. The catheter tip is at the cavoatrial junction. 2.  Okay to use and power inject chest port. Electronically Signed   By: Cordella Banner   On: 03/29/2024 16:09      "

## 2024-06-26 NOTE — Assessment & Plan Note (Signed)
 Compensated. Monitor LFT, mild elevated LFT maybe due to immunotherapy Pending manufactor assistance approval for Rifaximin .

## 2024-06-26 NOTE — Assessment & Plan Note (Signed)
 managed with rifaximin .  Has not tolerated lactulose in past.

## 2024-06-26 NOTE — Assessment & Plan Note (Signed)
Treatment as planned above. 

## 2024-06-26 NOTE — Assessment & Plan Note (Addendum)
 Multifocal HCC, s/p local therapy. August 2025 MRI and liver biopsy confirmed disease progression.  Recommend systemic treatment with Atezolizumab  plus bevacizumab  Previous  EGD showed no varices  Nov 2025 MRI abdomen w wo contrast at [facility] showed partial response.  Labs are reviewed and discussed with patient. Proceed with cycle 4 Atezolizumab  + Bevacizumab 

## 2024-06-28 ENCOUNTER — Other Ambulatory Visit: Payer: Self-pay

## 2024-07-01 ENCOUNTER — Other Ambulatory Visit: Payer: Self-pay | Admitting: Nurse Practitioner

## 2024-07-02 ENCOUNTER — Other Ambulatory Visit: Payer: Self-pay | Admitting: Nurse Practitioner

## 2024-07-02 NOTE — Telephone Encounter (Signed)
 Requested medications are due for refill today.  yes  Requested medications are on the active medications list.  yes  Last refill. 06/01/2024 #30 0 rf  Future visit scheduled.   no  Notes to clinic.  Per last refill - a courtesy refill. Pt needs an appt for additional refills.    Requested Prescriptions  Pending Prescriptions Disp Refills   montelukast  (SINGULAIR ) 10 MG tablet [Pharmacy Med Name: MONTELUKAST  SODIUM 10MG  TABLET] 30 tablet 0    Sig: TAKE ONE TABLET BY MOUTH AT BEDTIME. PATIENT NEEDS TO SCHEDULE AN OFFICE VISIT FOR FUTURE REFILLS     Pulmonology:  Leukotriene Inhibitors Passed - 07/02/2024  3:05 PM      Passed - Valid encounter within last 12 months    Recent Outpatient Visits           6 months ago Bipolar 1 disorder [facility])   [facility] Melvin Pao, NP   7 months ago Bipolar 1 disorder [facility])   [place] [facility] Melvin Pao, NP   9 months ago Bipolar 1 disorder [facility])   [place] [facility] Melvin Pao, NP   9 months ago Acute low back pain without sciatica, unspecified back pain laterality   [place] [facility] Melvin Pao, NP

## 2024-07-02 NOTE — Telephone Encounter (Signed)
 Mychart message sent to patient informing of need for appointment prior to medication refill.

## 2024-07-10 NOTE — Progress Notes (Signed)
 NN reached out to pt about upcoming appt. Pt is being treated with local oncologist for infusion, labs and scans. Pt agreeable to be followed up with Dr Casey and Estefana Luis, PA virtually. NN informed the pt that Dr Margot Estefana urban, PA is always available if necessary and to reach out via MyChart. Pt said she likes this plan and not having to drive to [facility].

## 2024-07-16 ENCOUNTER — Other Ambulatory Visit: Payer: Self-pay

## 2024-07-17 ENCOUNTER — Telehealth: Payer: Self-pay | Admitting: Oncology

## 2024-07-17 ENCOUNTER — Inpatient Hospital Stay: Admitting: Oncology

## 2024-07-17 ENCOUNTER — Encounter: Payer: Self-pay | Admitting: Oncology

## 2024-07-17 ENCOUNTER — Inpatient Hospital Stay

## 2024-07-17 ENCOUNTER — Ambulatory Visit

## 2024-07-17 NOTE — Telephone Encounter (Signed)
 Called pt to r/s missed appt - pt confirmed new appt date/time - LH

## 2024-07-19 ENCOUNTER — Inpatient Hospital Stay: Attending: Oncology

## 2024-07-19 ENCOUNTER — Inpatient Hospital Stay: Attending: Oncology | Admitting: Oncology

## 2024-07-19 ENCOUNTER — Encounter: Payer: Self-pay | Admitting: Oncology

## 2024-07-19 ENCOUNTER — Inpatient Hospital Stay

## 2024-07-19 VITALS — BP 149/93 | HR 74 | Temp 97.5°F | Resp 18 | Wt 125.2 lb

## 2024-07-19 DIAGNOSIS — C220 Liver cell carcinoma: Secondary | ICD-10-CM

## 2024-07-19 DIAGNOSIS — R7989 Other specified abnormal findings of blood chemistry: Secondary | ICD-10-CM | POA: Diagnosis not present

## 2024-07-19 DIAGNOSIS — K7469 Other cirrhosis of liver: Secondary | ICD-10-CM | POA: Diagnosis not present

## 2024-07-19 DIAGNOSIS — Z5112 Encounter for antineoplastic immunotherapy: Secondary | ICD-10-CM | POA: Insufficient documentation

## 2024-07-19 DIAGNOSIS — Z5111 Encounter for antineoplastic chemotherapy: Secondary | ICD-10-CM

## 2024-07-19 LAB — CBC WITH DIFFERENTIAL (CANCER CENTER ONLY)
Abs Immature Granulocytes: 0.01 K/uL (ref 0.00–0.07)
Basophils Absolute: 0 K/uL (ref 0.0–0.1)
Basophils Relative: 0 %
Eosinophils Absolute: 0.1 K/uL (ref 0.0–0.5)
Eosinophils Relative: 1 %
HCT: 41.4 % (ref 36.0–46.0)
Hemoglobin: 14.2 g/dL (ref 12.0–15.0)
Immature Granulocytes: 0 %
Lymphocytes Relative: 29 %
Lymphs Abs: 1.7 K/uL (ref 0.7–4.0)
MCH: 31.8 pg (ref 26.0–34.0)
MCHC: 34.3 g/dL (ref 30.0–36.0)
MCV: 92.8 fL (ref 80.0–100.0)
Monocytes Absolute: 0.5 K/uL (ref 0.1–1.0)
Monocytes Relative: 8 %
Neutro Abs: 3.5 K/uL (ref 1.7–7.7)
Neutrophils Relative %: 62 %
Platelet Count: 163 K/uL (ref 150–400)
RBC: 4.46 MIL/uL (ref 3.87–5.11)
RDW: 13.1 % (ref 11.5–15.5)
WBC Count: 5.7 K/uL (ref 4.0–10.5)
nRBC: 0 % (ref 0.0–0.2)

## 2024-07-19 LAB — CMP (CANCER CENTER ONLY)
ALT: 17 U/L (ref 0–44)
AST: 30 U/L (ref 15–41)
Albumin: 4.3 g/dL (ref 3.5–5.0)
Alkaline Phosphatase: 128 U/L — ABNORMAL HIGH (ref 38–126)
Anion gap: 11 (ref 5–15)
BUN: 11 mg/dL (ref 8–23)
CO2: 23 mmol/L (ref 22–32)
Calcium: 9.3 mg/dL (ref 8.9–10.3)
Chloride: 101 mmol/L (ref 98–111)
Creatinine: 0.7 mg/dL (ref 0.44–1.00)
GFR, Estimated: >60 mL/min
Glucose, Bld: 160 mg/dL — ABNORMAL HIGH (ref 70–99)
Potassium: 4.5 mmol/L (ref 3.5–5.1)
Sodium: 135 mmol/L (ref 135–145)
Total Bilirubin: <0.2 mg/dL (ref 0.0–1.2)
Total Protein: 7.1 g/dL (ref 6.5–8.1)

## 2024-07-19 LAB — TOTAL PROTEIN, URINE DIPSTICK: Protein, ur: NEGATIVE mg/dL

## 2024-07-19 LAB — TSH: TSH: 4.44 u[IU]/mL (ref 0.350–4.500)

## 2024-07-19 MED ORDER — SODIUM CHLORIDE 0.9 % IV SOLN
15.0000 mg/kg | Freq: Once | INTRAVENOUS | Status: AC
  Administered 2024-07-19: 800 mg via INTRAVENOUS
  Filled 2024-07-19: qty 32

## 2024-07-19 MED ORDER — SODIUM CHLORIDE 0.9 % IV SOLN
1200.0000 mg | Freq: Once | INTRAVENOUS | Status: AC
  Administered 2024-07-19: 1200 mg via INTRAVENOUS
  Filled 2024-07-19: qty 20

## 2024-07-19 MED ORDER — SODIUM CHLORIDE 0.9 % IV SOLN
INTRAVENOUS | Status: DC
  Filled 2024-07-19: qty 250

## 2024-07-19 NOTE — Assessment & Plan Note (Addendum)
 Multifocal HCC, s/p local therapy. August 2025 MRI and liver biopsy confirmed disease progression.  Recommend systemic treatment with Atezolizumab  plus bevacizumab  Previous  EGD showed no varices  Nov 2025 MRI abdomen w wo contrast at [facility] showed partial response.  Labs are reviewed and discussed with patient. Proceed with cycle 5 Atezolizumab  + Bevacizumab  Repeat MRI after cycle 6

## 2024-07-19 NOTE — Patient Instructions (Signed)
 [facility] - A DEPT OF [employer]. [facility]  Discharge Instructions: Thank you for choosing [facility] to provide your oncology and hematology care.  If you have a lab appointment with the Cancer Center, please go directly to the Cancer Center and check in at the registration area.  Wear comfortable clothing and clothing appropriate for easy access to any Portacath or PICC line.   We strive to give you quality time with your provider. You may need to reschedule your appointment if you arrive late (15 or more minutes).  Arriving late affects you and other patients whose appointments are after yours.  Also, if you miss three or more appointments without notifying the office, you may be dismissed from the clinic at the providers discretion.      For prescription refill requests, have your pharmacy contact our office and allow 72 hours for refills to be completed.    Today you received the following chemotherapy and/or immunotherapy agents Tecentriq , Bevacuzimab      To help prevent nausea and vomiting after your treatment, we encourage you to take your nausea medication as directed.  BELOW ARE SYMPTOMS THAT SHOULD BE REPORTED IMMEDIATELY: *FEVER GREATER THAN 100.4 F (38 C) OR HIGHER *CHILLS OR SWEATING *NAUSEA AND VOMITING THAT IS NOT CONTROLLED WITH YOUR NAUSEA MEDICATION *UNUSUAL SHORTNESS OF BREATH *UNUSUAL BRUISING OR BLEEDING *URINARY PROBLEMS (pain or burning when urinating, or frequent urination) *BOWEL PROBLEMS (unusual diarrhea, constipation, pain near the anus) TENDERNESS IN MOUTH AND THROAT WITH OR WITHOUT PRESENCE OF ULCERS (sore throat, sores in mouth, or a toothache) UNUSUAL RASH, SWELLING OR PAIN  UNUSUAL VAGINAL DISCHARGE OR ITCHING   Items with * indicate a potential emergency and should be followed up as soon as possible or go to the Emergency Department if any problems should occur.  Please show the CHEMOTHERAPY ALERT CARD or  IMMUNOTHERAPY ALERT CARD at check-in to the Emergency Department and triage nurse.  Should you have questions after your visit or need to cancel or reschedule your appointment, please contact [facility] - A DEPT OF [name]  [phone] and follow the prompts.  Office hours are 8:00 a.m. to 4:30 p.m. Monday - Friday. Please note that voicemails left after 4:00 p.m. may not be returned until the following business day.  We are closed weekends and major holidays. You have access to a nurse at all times for urgent questions. Please call the main number to the clinic [phone] and follow the prompts.  For any non-urgent questions, you may also contact your provider using MyChart. We now offer e-Visits for anyone 53 and older to request care online for non-urgent symptoms. For details visit [email or web address].   Also download the MyChart app! Go to the app store, search MyChart, open the app, select [facility], and log in with your MyChart username and password.  Atezolizumab  Injection What is this medication? ATEZOLIZUMAB  (a te zoe LIZ ue mab) treats some types of cancer. It works by helping your immune system slow or stop the spread of cancer cells. It is a monoclonal antibody. This medicine may be used for other purposes; ask your health care provider or pharmacist if you have questions. COMMON BRAND NAME(S): Tecentriq  What should I tell my care team before I take this medication? They need to know if you have any of these conditions: Allogeneic stem cell transplant (uses someone else's stem cells) Autoimmune diseases, such as  Crohn disease, ulcerative colitis, lupus History of chest radiation Nervous system problems, such as Guillain-Barre syndrome, myasthenia gravis Organ transplant An unusual or allergic reaction to atezolizumab , other medications, foods, dyes, or preservatives Pregnant or trying to get pregnant Breast-feeding How should I use  this medication? This medication is injected into a vein. It is given by your care team in a [facility] or clinic setting. A special MedGuide will be given to you before each treatment. Be sure to read this information carefully each time. Talk to your care team about the use of this medication in children. While it may be prescribed for children as young as 2 years for selected conditions, precautions do apply. Overdosage: If you think you have taken too much of this medicine contact a poison control center or emergency room at once. NOTE: This medicine is only for you. Do not share this medicine with others. What if I miss a dose? Keep appointments for follow-up doses. It is important not to miss your dose. Call your care team if you are unable to keep an appointment. What may interact with this medication? Interactions have not been studied. This list may not describe all possible interactions. Give your health care provider a list of all the medicines, herbs, non-prescription drugs, or dietary supplements you use. Also tell them if you smoke, drink alcohol, or use illegal drugs. Some items may interact with your medicine. What should I watch for while using this medication? Your condition will be monitored carefully while you are receiving this medication. You may need blood work done while you are taking this medication. This medication may cause serious skin reactions. They can happen weeks to months after starting the medication. Contact your care team right away if you notice fevers or flu-like symptoms with a rash. The rash may be red or purple and then turn into blisters or peeling of the skin. You may also notice a red rash with swelling of the face, lips, or lymph nodes in your neck or under your arms. Talk to your care team if you may be pregnant. Serious birth defects can occur if you take this medication during pregnancy and for 5 months after the last dose. You will need a negative  pregnancy test before starting this medication. Contraception is recommended while taking this medication and for 5 months after the last dose. Your care team can help you find the option that works for you. Do not breastfeed while taking this medication and for 5 months after the last dose. This medication may cause infertility. Talk to your care team if you are concerned about your fertility. What side effects may I notice from receiving this medication? Side effects that you should report to your doctor or health care professional as soon as possible: Allergic reactions--skin rash, itching, hives, swelling of the face, lips, tongue, or throat Dry cough, shortness of breath or trouble breathing Eye pain, redness, irritation, or discharge with blurry or decreased vision Heart muscle inflammation--unusual weakness or fatigue, shortness of breath, chest pain, fast or irregular heartbeat, dizziness, swelling of the ankles, feet, or hands Hormone gland problems--headache, sensitivity to light, unusual weakness or fatigue, dizziness, fast or irregular heartbeat, increased sensitivity to cold or heat, excessive sweating, constipation, hair loss, increased thirst or amount of urine, tremors or shaking, irritability Infusion reactions--chest pain, shortness of breath or trouble breathing, feeling faint or lightheaded Kidney injury (glomerulonephritis)--decrease in the amount of urine, red or dark brown urine, foamy or bubbly urine, swelling of  the ankles, hands, or feet Liver injury--right upper belly pain, loss of appetite, nausea, light-colored stool, dark yellow or brown urine, yellowing skin or eyes, unusual weakness or fatigue Pain, tingling, or numbness in the hands or feet, muscle weakness, change in vision, confusion or trouble speaking, loss of balance or coordination, trouble walking, seizures Rash, fever, and swollen lymph nodes Redness, blistering, peeling, or loosening of the skin, including  inside the mouth Sudden or severe stomach pain, bloody diarrhea, fever, nausea, vomiting Side effects that usually do not require medical attention (report to your doctor or health care professional if they continue or are bothersome): Bone, joint, or muscle pain Diarrhea Fatigue Loss of appetite Nausea Skin rash This list may not describe all possible side effects. Call your doctor for medical advice about side effects. You may report side effects to FDA at 1-800-FDA-1088. Where should I keep my medication? This medication is given in a [facility] or clinic. It will not be stored at home. NOTE: This sheet is a summary. It may not cover all possible information. If you have questions about this medicine, talk to your doctor, pharmacist, or health care provider.  2024 Elsevier/Gold Standard (2023-03-29 00:00:00)  Bevacizumab  Injection What is this medication? BEVACIZUMAB  (be va SIZ yoo mab) treats some types of cancer. It works by blocking a protein that causes cancer cells to grow and multiply. This helps to slow or stop the spread of cancer cells. It is a monoclonal antibody. This medicine may be used for other purposes; ask your health care provider or pharmacist if you have questions. COMMON BRAND NAME(S): Alymsys , Avastin , MVASI , Vegzalma, Zirabev  What should I tell my care team before I take this medication? They need to know if you have any of these conditions: Blood clots Coughing up blood Having or recent surgery Heart failure High blood pressure History of a connection between 2 or more body parts that do not usually connect (fistula) History of a tear in your stomach or intestines Protein in your urine An unusual or allergic reaction to bevacizumab , other medications, foods, dyes, or preservatives Pregnant or trying to get pregnant Breast-feeding How should I use this medication? This medication is injected into a vein. It is given by your care team in a [facility] or clinic  setting. Talk to your care team the use of this medication in children. Special care may be needed. Overdosage: If you think you have taken too much of this medicine contact a poison control center or emergency room at once. NOTE: This medicine is only for you. Do not share this medicine with others. What if I miss a dose? Keep appointments for follow-up doses. It is important not to miss your dose. Call your care team if you are unable to keep an appointment. What may interact with this medication? Interactions are not expected. This list may not describe all possible interactions. Give your health care provider a list of all the medicines, herbs, non-prescription drugs, or dietary supplements you use. Also tell them if you smoke, drink alcohol, or use illegal drugs. Some items may interact with your medicine. What should I watch for while using this medication? Your condition will be monitored carefully while you are receiving this medication. You may need blood work while taking this medication. This medication may make you feel generally unwell. This is not uncommon as chemotherapy can affect healthy cells as well as cancer cells. Report any side effects. Continue your course of treatment even though you feel ill unless  your care team tells you to stop. This medication may increase your risk to bruise or bleed. Call your care team if you notice any unusual bleeding. Before having surgery, talk to your care team to make sure it is ok. This medication can increase the risk of poor healing of your surgical site or wound. You will need to stop this medication for 28 days before surgery. After surgery, wait at least 28 days before restarting this medication. Make sure the surgical site or wound is healed enough before restarting this medication. Talk to your care team if questions. Talk to your care team if you may be pregnant. Serious birth defects can occur if you take this medication during pregnancy  and for 6 months after the last dose. Contraception is recommended while taking this medication and for 6 months after the last dose. Your care team can help you find the option that works for you. Do not breastfeed while taking this medication and for 6 months after the last dose. This medication can cause infertility. Talk to your care team if you are concerned about your fertility. What side effects may I notice from receiving this medication? Side effects that you should report to your care team as soon as possible: Allergic reactions--skin rash, itching, hives, swelling of the face, lips, tongue, or throat Bleeding--bloody or black, tar-like stools, vomiting blood or brown material that looks like coffee grounds, red or dark brown urine, small red or purple spots on skin, unusual bruising or bleeding Blood clot--pain, swelling, or warmth in the leg, shortness of breath, chest pain Heart attack--pain or tightness in the chest, shoulders, arms, or jaw, nausea, shortness of breath, cold or clammy skin, feeling faint or lightheaded Heart failure--shortness of breath, swelling of the ankles, feet, or hands, sudden weight gain, unusual weakness or fatigue Increase in blood pressure Infection--fever, chills, cough, sore throat, wounds that don't heal, pain or trouble when passing urine, general feeling of discomfort or being unwell Infusion reactions--chest pain, shortness of breath or trouble breathing, feeling faint or lightheaded Kidney injury--decrease in the amount of urine, swelling of the ankles, hands, or feet Stomach pain that is severe, does not go away, or gets worse Stroke--sudden numbness or weakness of the face, arm, or leg, trouble speaking, confusion, trouble walking, loss of balance or coordination, dizziness, severe headache, change in vision Sudden and severe headache, confusion, change in vision, seizures, which may be signs of posterior reversible encephalopathy syndrome  (PRES) Side effects that usually do not require medical attention (report to your care team if they continue or are bothersome): Back pain Change in taste Diarrhea Dry skin Increased tears Nosebleed This list may not describe all possible side effects. Call your doctor for medical advice about side effects. You may report side effects to FDA at 1-800-FDA-1088. Where should I keep my medication? This medication is given in a [facility] or clinic. It will not be stored at home. NOTE: This sheet is a summary. It may not cover all possible information. If you have questions about this medicine, talk to your doctor, pharmacist, or health care provider.  2024 Elsevier/Gold Standard (2021-10-29 00:00:00)

## 2024-07-19 NOTE — Progress Notes (Signed)
 " Hematology/Oncology Progress note [phone] [phone]         Patient Care Team: Melvin Pao, NP as PCP - General (Nurse Practitioner) Maurie Rayfield BIRCH, RN as Oncology Nurse Navigator Babara Call, MD as Consulting Physician (Oncology)   CHIEF COMPLAINTS/REASON FOR VISIT:   multifocal HCC   ASSESSMENT & PLAN:   Hepatocellular carcinoma (HCC) Multifocal HCC, s/p local therapy. August 2025 MRI and liver biopsy confirmed disease progression.  Recommend systemic treatment with Atezolizumab  plus bevacizumab  Previous  EGD showed no varices  Nov 2025 MRI abdomen w wo contrast at [facility] showed partial response.  Labs are reviewed and discussed with patient. Proceed with cycle 5 Atezolizumab  + Bevacizumab  Repeat MRI after cycle 6  Encounter for antineoplastic chemotherapy Treatment as planned above.   Other cirrhosis of liver (HCC) Compensated. Monitor LFT, mild elevated LFT maybe due to immunotherapy Pending manufactor assistance approval for Rifaximin .    Orders Placed This Encounter  Procedures   Total Protein, Urine dipstick    Standing Status:   Future    Expected Date:   08/09/2024    Expiration Date:   08/09/2025   CBC with Differential (Cancer Center Only)    Standing Status:   Future    Expected Date:   08/09/2024    Expiration Date:   08/09/2025   CMP (Cancer Center only)    Standing Status:   Future    Expected Date:   08/09/2024    Expiration Date:   08/09/2025   Follow up in 3 weeks . All questions were answered. The patient knows to call the clinic with any problems, questions or concerns.  Call Babara, MD, PhD [facility] Hematology Oncology 07/19/2024    HISTORY OF PRESENTING ILLNESS:   Monique Tucker is a  64 y.o.  female with PMH listed below was seen in consultation at the request of  Babara Call, MD  for evaluation of multifocal [facility]  Patient had ultrasounds done in June 2022 which showed features of liver cirrhosis and a  scattered hypodense hepatic lesions largest measuring 2.4 cm. 03/22/2021, patient had a liver MRI done which showed a 3 hyperenhancing lesions of the liver.  Highly suspicious for HCC, LIRAD 5. Size of 3 lesions are 2.0 x 1.7 cm, 1.4 x 1.1 cm, 2.1 x 1.4 cm. Liver cirrhosis.  Patient was recommended by primary care provider to establish care with gastroenterology. 04/30/2021, lab work showed elevated alkaline phosphatase of 132, AST and ALT are mildly elevated.  Normal bilirubin.  05/24/2021, patient was seen by gastroenterology Dr. Therisa. Additional blood work was obtained.  AFP was elevated in the 300s. Hepatitis C antibody is positive.  Negative antimicrosomal antibody, negative HIV, negative ANA, negative mitochondrial/smooth muscle cell antibody panel.  Ferritin was elevated at 510.  Normal iron saturation.  Elevated immunoglobulins 1689, celiac panel negative.  Alpha antitrypsin elevated 221.  Patient has a history of alcohol use.  14 cans of beer per week.  She has stopped alcohol use since her visits with gastroenterology.  Current everyday smoker.  A pack a day.  She is working on smoke cessation Patient works at [employer]. She has anxiety and depression and she previously follows up with a psychiatrist.  She has not followed up with a psychiatrist recently.  Patient takes Wellbutrin, Effexor , Seroquel .  Oncology care records  was reviewed.  June 22: US  for known HCV/EtOH ESLD-> 2.4cm liver lesions 03/22/21: 3 LIRADS5 1.4-2cm on MRI; AFP 300s 06/26/21: LR5 seg 6  lesion, LR4/3 lesion seg 4, 6, 1 07/02/21: discussed LRT/TACE/TARE-> future systemic therapy 08/06/21: TACE; 03/07/22: lap ablation 08/14/21-01/26/24: followed surveillance with Dr. Erick 01/26/2024 MRI abdomen w wo contrast   Interval prominent increase in size of segment 5 and 6, 7.4 x 3.8 x 4.9 cm Enlarged central necrotic lymph nodes in the portacaval region and aortocaval region. Treated lesion located in segment 6 and 7  is not visualized    02/23/24: liver carcinoma on bx; no varices on EGD; BCLC-A->met ot lymph node She was recommended to start first line systemic treatment with Atezolizumab  plus bevacizumab . She prefers to reestablish care and get treatment locally.    Appetite is fair.  INTERVAL HISTORY Monique Tucker is a 64 y.o. female who has above history reviewed by me today presents for follow up visit for [facility] treatments.   She reports no new complaints today. Denies sob, diarrhea, rash nausea or vomiting.     Review of Systems  Constitutional:  Negative for appetite change, chills, fatigue and fever.  HENT:   Negative for hearing loss and voice change.   Eyes:  Negative for eye problems.  Respiratory:  Negative for chest tightness and cough.   Cardiovascular:  Negative for chest pain.  Gastrointestinal:  Negative for abdominal distention, abdominal pain and blood in stool.  Endocrine: Negative for hot flashes.  Genitourinary:  Negative for difficulty urinating and frequency.   Musculoskeletal:  Negative for arthralgias.  Skin:  Negative for itching and rash.  Neurological:  Negative for extremity weakness.  Hematological:  Negative for adenopathy.  Psychiatric/Behavioral:  Positive for depression. Negative for confusion. The patient is not nervous/anxious.     MEDICAL HISTORY:  Past Medical History:  Diagnosis Date   Acute hepatitis C    Anxiety    AR (allergic rhinitis)    Arthritis    Bulging lumbar disc    COPD (chronic obstructive pulmonary disease) (HCC)    mild   Depression    Hypertension    Plantar fasciitis    Restless leg syndrome, controlled    Sciatica    left    SURGICAL HISTORY: Past Surgical History:  Procedure Laterality Date   BLADDER REPAIR     CATARACT EXTRACTION W/PHACO Left 05/13/2020   Procedure: CATARACT EXTRACTION PHACO AND INTRAOCULAR LENS PLACEMENT (IOC) LEFT;  Surgeon: Mittie Gaskin, MD;  Location: [facility];  Service:  Ophthalmology;  Laterality: Left;  5.15 049.8 10.4%   CATARACT EXTRACTION W/PHACO Right 05/27/2020   Procedure: CATARACT EXTRACTION PHACO AND INTRAOCULAR LENS PLACEMENT (IOC) RIGHT;  Surgeon: Mittie Gaskin, MD;  Location: [facility];  Service: Ophthalmology;  Laterality: Right;  3.29 0:55.2 6.0%   CYSTOCELE REPAIR N/A 10/19/2015   Procedure: ANTERIOR REPAIR (CYSTOCELE);  Surgeon: Gladis DELENA Dollar, MD;  Location: [facility];  Service: Gynecology;  Laterality: N/A;   ESOPHAGOGASTRODUODENOSCOPY (EGD) WITH PROPOFOL  N/A 05/25/2021   Procedure: ESOPHAGOGASTRODUODENOSCOPY (EGD) WITH PROPOFOL ;  Surgeon: Therisa Bi, MD;  Location: [facility];  Service: Gastroenterology;  Laterality: N/A;   EYE SURGERY     IR IMAGING GUIDED PORT INSERTION  03/29/2024   RECTOCELE REPAIR N/A 05/02/2016   Procedure: POSTERIOR REPAIR (RECTOCELE);  Surgeon: Gladis DELENA Dollar, MD;  Location: [facility];  Service: Gynecology;  Laterality: N/A;   TOTAL VAGINAL HYSTERECTOMY     TUBAL LIGATION     VAGINAL HYSTERECTOMY Bilateral 10/19/2015   Procedure: HYSTERECTOMY VAGINAL WITH BILATERAL SALPINGO-OOPHERECTOMY;  Surgeon: Gladis DELENA Dollar, MD;  Location: [facility];  Service: Gynecology;  Laterality: Bilateral;  SOCIAL HISTORY: Social History   Socioeconomic History   Marital status: Single    Spouse name: Not on file   Number of children: Not on file   Years of education: Not on file   Highest education level: 12th grade  Occupational History   Not on file  Tobacco Use   Smoking status: Every Day    Current packs/day: 1.00    Types: Cigarettes   Smokeless tobacco: Never   Tobacco comments:     started age 60  Vaping Use   Vaping status: Every Day  Substance and Sexual Activity   Alcohol use: Not Currently    Comment:     Drug use: No   Sexual activity: Not Currently  Other Topics Concern   Not on file  Social History Narrative   Not on file   Social Drivers of Health   Tobacco Use: High  Risk (07/19/2024)   Patient History    Smoking Tobacco Use: Every Day    Smokeless Tobacco Use: Never    Passive Exposure: Not on file  Financial Resource Strain: Medium Risk (06/17/2024)   Overall Financial Resource Strain (CARDIA)    Difficulty of Paying Living Expenses: Somewhat hard  Food Insecurity: Food Insecurity Present (06/17/2024)   Epic    Worried About Programme Researcher, Broadcasting/film/video in the Last Year: Sometimes true    Ran Out of Food in the Last Year: Sometimes true  Transportation Needs: Unmet Transportation Needs (06/17/2024)   Epic    Lack of Transportation (Medical): Yes    Lack of Transportation (Non-Medical): Yes  Physical Activity: Inactive (06/17/2024)   Exercise Vital Sign    Days of Exercise per Week: 0 days    Minutes of Exercise per Session: Not on file  Stress: Stress Concern Present (06/17/2024)   [employer] of Occupational Health - Occupational Stress Questionnaire    Feeling of Stress: To some extent  Social Connections: Socially Isolated (06/17/2024)   Social Connection and Isolation Panel    Frequency of Communication with Friends and Family: More than three times a week    Frequency of Social Gatherings with Friends and Family: Never    Attends Religious Services: Never    Database Administrator or Organizations: No    Attends Engineer, Structural: Not on file    Marital Status: Divorced  Intimate Partner Violence: Not At Risk (02/22/2024)   Received from [facility]   Epic    Within the last year, have you been afraid of your partner or ex-partner?: No    Within the last year, have you been humiliated or emotionally abused in other ways by your partner or ex-partner?: No    Within the last year, have you been kicked, hit, slapped, or otherwise physically hurt by your partner or ex-partner?: No    Within the last year, have you been raped or forced to have any kind of sexual activity by your partner or ex-partner?: No  Depression (PHQ2-9):  Low Risk (07/19/2024)   Depression (PHQ2-9)    PHQ-2 Score: 0  Alcohol Screen: Not on file  Housing: Unknown (06/17/2024)   Epic    Unable to Pay for Housing in the Last Year: No    Number of Times Moved in the Last Year: Not on file    Homeless in the Last Year: No  Utilities: Not on file  Health Literacy: Not on file    FAMILY HISTORY: Family History  Problem Relation Age of  Onset   Breast cancer Mother    Pancreatic cancer Mother    Cancer Mother        breast and pancreatic   Hypertension Mother    Migraines Mother    Diabetes Mother    Heart disease Father    Mental illness Sister        depression   Heart attack Sister    Diabetes Sister    Alcohol abuse Maternal Grandfather    Cancer Maternal Grandmother        lung and brain   Mental illness Sister        depression    ALLERGIES:  is allergic to bee venom, hydralazine , other, and lisinopril .  MEDICATIONS:  Current Outpatient Medications  Medication Sig Dispense Refill   albuterol  (VENTOLIN  HFA) 108 (90 Base) MCG/ACT inhaler Inhale 2 puffs into the lungs every 6 (six) hours as needed for wheezing or shortness of breath. 18 g 3   amLODipine  (NORVASC ) 2.5 MG tablet TAKE ONE TABLET BY MOUTH ONCE A DAY. TO TAKE IN CONJUNCTION WITH FIVE MG TABLET TO = A DOSE OF 7.5 MG 30 tablet 1   amLODipine  (NORVASC ) 5 MG tablet TAKE ONE TABLET BY MOUTH ONCE A DAY. TAKE WITH THE 2.5 MG TABLET TO EQUAL A TOTAL DOSAGE OF 7.5 MG 90 tablet 1   busPIRone  (BUSPAR ) 30 MG tablet TAKE ONE TABLET (30 MG TOTAL) BY MOUTH TWO (TWO) TIMES DAILY. 60 tablet 0   cetirizine  (ZYRTEC  ALLERGY) 10 MG tablet Take 1 tablet (10 mg total) by mouth daily. 90 tablet 1   EPINEPHrine  0.3 mg/0.3 mL IJ SOAJ injection INJECT 0.3 MLS INTO THE MUSCLE ONCE 2 each 1   fluticasone -salmeterol (ADVAIR DISKUS) 250-50 MCG/ACT AEPB Inhale 1 puff into the lungs in the morning and at bedtime. 60 each 2   gabapentin  (NEURONTIN ) 300 MG capsule TAKE ONE CAPSULE BY MOUTH THREE  TIMES A DAY 270 capsule 1   lidocaine -prilocaine  (EMLA ) cream Apply 1 Application topically as needed. Apply to port and cover with saran wrap 1-2 hours prior to port access 30 g 2   losartan  (COZAAR ) 100 MG tablet TAKE ONE TABLET (100 MG TOTAL) BY MOUTH DAILY. 30 tablet 0   Magnesium  500 MG TABS Take 500 mg by mouth daily.     Melatonin 5 MG CHEW Chew by mouth.     methocarbamol  (ROBAXIN ) 500 MG tablet TAKE ONE TABLET (500 MG TOTAL) BY MOUTH FOUR TIMES DAILY. 60 tablet 0   montelukast  (SINGULAIR ) 10 MG tablet TAKE ONE TABLET BY MOUTH AT BEDTIME 30 tablet 0   ondansetron  (ZOFRAN ) 8 MG tablet TAKE ONE TABLET (8 MG TOTAL) BY MOUTH EVERY EIGHT HOURS AS NEEDED FOR NAUSEA OR VOMITING. 90 tablet 1   OXcarbazepine  (TRILEPTAL ) 150 MG tablet Take 1 tablet (150 mg total) by mouth 2 (two) times daily. 90 tablet 1   potassium chloride  SA (KLOR-CON  M) 20 MEQ tablet TAKE ONE TABLET BY MOUTH ONCE A DAY 90 tablet 0   propranolol (INDERAL) 10 MG tablet      QUEtiapine  (SEROQUEL  XR) 200 MG 24 hr tablet Take 1 tablet (200 mg total) by mouth at bedtime. 90 tablet 1   rifaximin  (XIFAXAN ) 550 MG TABS tablet Take 1 tablet (550 mg total) by mouth 2 (two) times daily.     Tiotropium Bromide  Monohydrate (SPIRIVA  RESPIMAT) 2.5 MCG/ACT AERS INHALE 1 PUFF BY MOUTH INTO THE LUNGS DAILY 4 g 11   venlafaxine  XR (EFFEXOR -XR) 150 MG 24 hr capsule Take  1 capsule (150 mg total) by mouth daily with breakfast. 90 capsule 0   conjugated estrogens  (PREMARIN ) vaginal cream Place 1 Applicatorful vaginally daily. (Patient not taking: Reported on 07/19/2024) 42.5 g 12   diclofenac  Sodium (VOLTAREN ) 1 % GEL Apply 2 g topically 4 (four) times daily. (Patient not taking: Reported on 07/19/2024) 50 g 0   Varenicline  Tartrate, Starter, (CHANTIX  STARTING MONTH PAK) 0.5 MG X 11 & 1 MG X 42 TBPK Take one 0.5 mg tablet by mouth once daily for 3 days, then increase to one 0.5 mg tablet twice daily for 4 days, then increase to one 1 mg tablet twice daily.  (Patient not taking: Reported on 07/19/2024) 53 each 0   Current Facility-Administered Medications  Medication Dose Route Frequency Provider Last Rate Last Admin   betamethasone  acetate-betamethasone  sodium phosphate  (CELESTONE ) injection 3 mg  3 mg Intramuscular Once Evans, Brent M, DPM       betamethasone  acetate-betamethasone  sodium phosphate  (CELESTONE ) injection 3 mg  3 mg Intramuscular Once Janit Thresa HERO, DPM         PHYSICAL EXAMINATION: ECOG PERFORMANCE STATUS: 1 - Symptomatic but completely ambulatory Vitals:   07/19/24 0940 07/19/24 0953  BP: (!) 174/82 (!) 149/93  Pulse: 74   Resp: 18   Temp: (!) 97.5 F (36.4 C)   SpO2: 96%    Filed Weights   07/19/24 0940  Weight: 125 lb 3.2 oz (56.8 kg)    Physical Exam Constitutional:      General: She is not in acute distress. HENT:     Head: Normocephalic and atraumatic.  Eyes:     General: No scleral icterus. Cardiovascular:     Rate and Rhythm: Normal rate and regular rhythm.     Heart sounds: Normal heart sounds.  Pulmonary:     Effort: Pulmonary effort is normal. No respiratory distress.     Breath sounds: No wheezing.  Abdominal:     General: Bowel sounds are normal. There is no distension.     Palpations: Abdomen is soft.  Musculoskeletal:        General: No deformity. Normal range of motion.     Cervical back: Normal range of motion and neck supple.  Skin:    General: Skin is warm and dry.     Findings: No erythema or rash.  Neurological:     Mental Status: She is alert and oriented to person, place, and time. Mental status is at baseline.     Cranial Nerves: No cranial nerve deficit.     Coordination: Coordination normal.     LABORATORY DATA:  I have reviewed the data as listed Lab Results  Component Value Date   WBC 5.7 07/19/2024   HGB 14.2 07/19/2024   HCT 41.4 07/19/2024   MCV 92.8 07/19/2024   PLT 163 07/19/2024   Recent Labs    05/30/24 0753 06/26/24 0816 07/19/24 0904  NA 130* 138  135  K 4.6 4.4 4.5  CL 94* 103 101  CO2 25 25 23   GLUCOSE 180* 123* 160*  BUN 11 10 11   CREATININE 0.75 0.70 0.70  CALCIUM 9.6 9.4 9.3  GFRNONAA >60 >60 >60  PROT 7.2 7.0 7.1  ALBUMIN 4.3 4.2 4.3  AST 33 28 30  ALT 26 16 17   ALKPHOS 129* 109 128*  BILITOT 0.3 0.2 <0.2   Iron/TIBC/Ferritin/ %Sat    Component Value Date/Time   IRON 62 05/24/2021 1344   TIBC 295 05/24/2021 1344   FERRITIN 510 (H)  05/24/2021 1344   IRONPCTSAT 21 05/24/2021 1344      RADIOGRAPHIC STUDIES: I have personally reviewed the radiological images as listed and agreed with the findings in the report. No results found.     "

## 2024-07-19 NOTE — Assessment & Plan Note (Signed)
Treatment as planned above. 

## 2024-07-19 NOTE — Assessment & Plan Note (Signed)
 Compensated. Monitor LFT, mild elevated LFT maybe due to immunotherapy Pending manufactor assistance approval for Rifaximin .

## 2024-07-20 ENCOUNTER — Other Ambulatory Visit: Payer: Self-pay

## 2024-07-20 LAB — T4: T4, Total: 4.6 ug/dL (ref 4.5–12.0)

## 2024-07-22 ENCOUNTER — Other Ambulatory Visit: Payer: Self-pay | Admitting: Nurse Practitioner

## 2024-07-23 NOTE — Telephone Encounter (Signed)
 Requested medications are due for refill today.  yes  Requested medications are on the active medications list.  yes  Last refill. 06/25/2024 #30 0 rf  Future visit scheduled.   no  Notes to clinic.  Pt needs an OV. Pt already given a courtesy refill.    Requested Prescriptions  Pending Prescriptions Disp Refills   losartan  (COZAAR ) 100 MG tablet [Pharmacy Med Name: LOSARTAN  POTASSIUM 100MG  TABLET] 30 tablet 0    Sig: TAKE ONE TABLET (100 MG TOTAL) BY MOUTH DAILY. (NEEDS APPT FOR FUTURE REFILLS)     Cardiovascular:  Angiotensin Receptor Blockers Failed - 07/23/2024  3:17 PM      Failed - Last BP in normal range    BP Readings from Last 1 Encounters:  07/19/24 (!) 149/93         Failed - Valid encounter within last 6 months    Recent Outpatient Visits           7 months ago Bipolar 1 disorder [facility])   [facility] Melvin Pao, NP   8 months ago Bipolar 1 disorder [facility])   [facility] Melvin Pao, NP   9 months ago Bipolar 1 disorder [facility])   [place] [facility] Melvin Pao, NP   10 months ago Acute low back pain without sciatica, unspecified back pain laterality   [place] [facility] Melvin Pao, NP              Passed - Cr in normal range and within 180 days    Creatinine  Date Value Ref Range Status  07/19/2024 0.70 0.44 - 1.00 mg/dL Final         Passed - K in normal range and within 180 days    Potassium  Date Value Ref Range Status  07/19/2024 4.5 3.5 - 5.1 mmol/L Final         Passed - Patient is not pregnant

## 2024-07-24 ENCOUNTER — Ambulatory Visit: Payer: Self-pay

## 2024-07-24 NOTE — Telephone Encounter (Signed)
 Pt has c/o chronic earache and tinnitus for a long time, is undergoing cancer treatment, reports she would like whatever prescriptions to be filled. Appt for mid February was made, but please contact patient if she can be seen sooner. (Preferably a Wednesday, she says.) Pt was offered soonest available appt and she declined.    FYI Only or Action Required?: Action required by provider: request for appointment.  Patient was last seen in primary care on 12/20/2023 by Melvin Pao, NP.  Called Nurse Triage reporting Otalgia and Tinnitus.  Symptoms began A long time ago.  Interventions attempted: Nothing.  Symptoms are: unchanged.  Triage Disposition: See Within 2 Weeks in Clinical Biochemist understands and will follow disposition?: Yes     Reason for Triage: Worsening ear pain comes and go and feels like there is fluid in them. Would like an in office app for med check as well    Reason for Disposition  Earache  (Exceptions: Brief ear pain of lasting less than 60 minutes, or earache occurring during air travel.)  Answer Assessment - Initial Assessment Questions 1. LOCATION: Which ear is involved?     L ear  2. ONSET: When did the ear pain start?      A long time  3. SEVERITY: How bad is the pain?  (Scale 1-10; mild, moderate or severe)     Intermittent  4. URI SYMPTOMS: Do you have a runny nose or cough?     Denies  5. FEVER: Do you have a fever? If Yes, ask: What is your temperature, how was it measured, and when did it start?     Denies  7. OTHER SYMPTOMS: Do you have any other symptoms? (e.g., decreased hearing, dizziness, headache, stiff neck, vomiting) Reports decreased hearing in left ear, ringing in my ears, HA  Protocols used: Earache-A-AH

## 2024-07-25 ENCOUNTER — Other Ambulatory Visit: Payer: Self-pay

## 2024-07-25 MED ORDER — LOSARTAN POTASSIUM 100 MG PO TABS: 100.0000 mg | ORAL_TABLET | Freq: Every day | ORAL | 0 refills | Status: AC

## 2024-07-25 NOTE — Telephone Encounter (Signed)
 Prescription sent to the pharmacy.

## 2024-07-25 NOTE — Addendum Note (Signed)
 Addended by: MELVIN PAO on: 07/25/2024 09:13 AM   Modules accepted: Orders

## 2024-07-25 NOTE — Telephone Encounter (Signed)
 Patient is rescheduled for 02/04 at 11:20AM

## 2024-07-25 NOTE — Telephone Encounter (Signed)
 Patient has an appt scheduled for 2/18.

## 2024-07-25 NOTE — Telephone Encounter (Signed)
 Reason for Triage: Worsening ear pain comes and go and feels like there is fluid in them. Would like an in office app for med check as well  Scheduled for appt.

## 2024-07-31 ENCOUNTER — Encounter: Payer: Self-pay | Admitting: Nurse Practitioner

## 2024-07-31 ENCOUNTER — Ambulatory Visit (INDEPENDENT_AMBULATORY_CARE_PROVIDER_SITE_OTHER): Admitting: Nurse Practitioner

## 2024-07-31 VITALS — BP 173/87 | HR 85 | Temp 97.6°F | Ht 64.02 in | Wt 123.4 lb

## 2024-07-31 DIAGNOSIS — K130 Diseases of lips: Secondary | ICD-10-CM

## 2024-07-31 DIAGNOSIS — R32 Unspecified urinary incontinence: Secondary | ICD-10-CM | POA: Diagnosis not present

## 2024-07-31 DIAGNOSIS — R7309 Other abnormal glucose: Secondary | ICD-10-CM

## 2024-07-31 DIAGNOSIS — Z1231 Encounter for screening mammogram for malignant neoplasm of breast: Secondary | ICD-10-CM

## 2024-07-31 DIAGNOSIS — I10 Essential (primary) hypertension: Secondary | ICD-10-CM

## 2024-07-31 DIAGNOSIS — H9313 Tinnitus, bilateral: Secondary | ICD-10-CM

## 2024-07-31 DIAGNOSIS — M79644 Pain in right finger(s): Secondary | ICD-10-CM

## 2024-07-31 MED ORDER — PROPRANOLOL HCL 10 MG PO TABS: 10.0000 mg | ORAL_TABLET | Freq: Three times a day (TID) | ORAL | 0 refills | Status: AC

## 2024-07-31 MED ORDER — QUETIAPINE FUMARATE ER 200 MG PO TB24: 200.0000 mg | ORAL_TABLET | Freq: Every day | ORAL | 0 refills | Status: AC

## 2024-07-31 MED ORDER — BUSPIRONE HCL 30 MG PO TABS: 30.0000 mg | ORAL_TABLET | Freq: Three times a day (TID) | ORAL | 0 refills | Status: AC

## 2024-07-31 NOTE — Assessment & Plan Note (Signed)
 Chronic. Not well controlled.  Will refill propranolol  that patient has been out of.  Follow up in 1 month.  Call sooner if concerns arise.

## 2024-07-31 NOTE — Patient Instructions (Addendum)
 You have an order for:  []   2D Mammogram  [x]   3D Mammogram  []   Bone Density     Please call for appointment:    Make sure to wear two-piece clothing.  No lotions, powders, or deodorants the day of the appointment. Make sure to bring picture ID and insurance card.  Bring list of medications you are currently taking including any supplements.   Schedule your [facility] screening mammogram through MyChart!   Log into your MyChart account.  Go to 'Visit' (or 'Appointments' if on mobile App) --> Schedule an Appointment  Under 'Select a Reason for Visit' choose the Mammogram Screening option.  Complete the pre-visit questions and select the time and place that best fits your schedule.

## 2024-07-31 NOTE — Progress Notes (Signed)
 "  BP (!) 173/87 (BP Location: Right Arm, Cuff Size: Small)   Pulse 85   Temp 97.6 F (36.4 C) (Oral)   Ht 5' 4.02 (1.626 m)   Wt 123 lb 6.4 oz (56 kg)   LMP  (LMP Unknown)   SpO2 92%   BMI 21.17 kg/m    Subjective:    Patient ID: Monique CHRISTELLA Corona, female    DOB: Sep 09, 1960, 64 y.o.   MRN: [id number]  HPI: Monique Tucker is a 64 y.o. female  Chief Complaint  Patient presents with   Otalgia    Tinnitus, Patient stated it's been going on for months because it comes and goes. She stated there is a ringing noise in the ears(in both ears), and she feels fluid in them sometimes. She also mentioned the corner of her lips splitting and she wonders if the two coincide. Her nose is stopped up and started running as well, but she's not sick.   EAR PAIN Patient states she does have some ear pain on and off.  She also has ringing on and off.  She is also having cracking on the corners of her mouth.  She is taking infusion for her hepatocellular carcinoma.   She has started taking a B12 supplement but hasn't been consistent.  She has not been sick recently.  She is taking her antihistamine.   Patient has been having pain and swelling in right index finger.  She is ready to see Orthopedics.   Patient has continued to have incontinence.  She would like to see a specialist for concern.  Relevant past medical, surgical, family and social history reviewed and updated as indicated. Interim medical history since our last visit reviewed. Allergies and medications reviewed and updated.  Review of Systems  HENT:  Positive for congestion, ear discharge, ear pain and mouth sores.   Genitourinary:        Incontinence  Musculoskeletal:        Right finger pain and swelling    Per HPI unless specifically indicated above     Objective:    BP (!) 173/87 (BP Location: Right Arm, Cuff Size: Small)   Pulse 85   Temp 97.6 F (36.4 C) (Oral)   Ht 5' 4.02 (1.626 m)   Wt 123 lb 6.4 oz (56 kg)   LMP   (LMP Unknown)   SpO2 92%   BMI 21.17 kg/m   Wt Readings from Last 3 Encounters:  07/31/24 123 lb 6.4 oz (56 kg)  07/19/24 125 lb 3.2 oz (56.8 kg)  06/26/24 121 lb (54.9 kg)    Physical Exam Vitals and nursing note reviewed.  Constitutional:      General: She is not in acute distress.    Appearance: Normal appearance. She is normal weight. She is not ill-appearing, toxic-appearing or diaphoretic.  HENT:     Head: Normocephalic.     Comments: No sores on exam    Right Ear: External ear normal.     Left Ear: External ear normal.     Nose: Nose normal. No congestion or rhinorrhea.     Mouth/Throat:     Mouth: Mucous membranes are moist.     Pharynx: Oropharynx is clear.  Eyes:     General:        Right eye: No discharge.        Left eye: No discharge.     Extraocular Movements: Extraocular movements intact.     Conjunctiva/sclera: Conjunctivae normal.  Pupils: Pupils are equal, round, and reactive to light.  Cardiovascular:     Rate and Rhythm: Normal rate and regular rhythm.     Heart sounds: No murmur heard. Pulmonary:     Effort: Pulmonary effort is normal. No respiratory distress.     Breath sounds: Normal breath sounds. No wheezing or rales.  Musculoskeletal:     Cervical back: Normal range of motion and neck supple.     Comments: Right index finger swelling  Skin:    General: Skin is warm and dry.     Capillary Refill: Capillary refill takes less than 2 seconds.  Neurological:     General: No focal deficit present.     Mental Status: She is alert and oriented to person, place, and time. Mental status is at baseline.  Psychiatric:        Mood and Affect: Mood normal.        Behavior: Behavior normal.        Thought Content: Thought content normal.        Judgment: Judgment normal.     Results for orders placed or performed in visit on 07/19/24  Total Protein, Urine dipstick   Collection Time: 07/19/24  9:04 AM  Result Value Ref Range   Protein, ur NEGATIVE  NEGATIVE mg/dL  TSH   Collection Time: 07/19/24  9:04 AM  Result Value Ref Range   TSH 4.440 0.350 - 4.500 uIU/mL  T4   Collection Time: 07/19/24  9:04 AM  Result Value Ref Range   T4, Total 4.6 4.5 - 12.0 ug/dL  CMP ([facility] Center only)   Collection Time: 07/19/24  9:04 AM  Result Value Ref Range   Sodium 135 135 - 145 mmol/L   Potassium 4.5 3.5 - 5.1 mmol/L   Chloride 101 98 - 111 mmol/L   CO2 23 22 - 32 mmol/L   Glucose, Bld 160 (H) 70 - 99 mg/dL   BUN 11 8 - 23 mg/dL   Creatinine 9.29 9.55 - 1.00 mg/dL   Calcium 9.3 8.9 - 89.6 mg/dL   Total Protein 7.1 6.5 - 8.1 g/dL   Albumin 4.3 3.5 - 5.0 g/dL   AST 30 15 - 41 U/L   ALT 17 0 - 44 U/L   Alkaline Phosphatase 128 (H) 38 - 126 U/L   Total Bilirubin <0.2 0.0 - 1.2 mg/dL   GFR, Estimated >39 >39 mL/min   Anion gap 11 5 - 15  CBC with Differential (Cancer Center Only)   Collection Time: 07/19/24  9:04 AM  Result Value Ref Range   WBC Count 5.7 4.0 - 10.5 K/uL   RBC 4.46 3.87 - 5.11 MIL/uL   Hemoglobin 14.2 12.0 - 15.0 g/dL   HCT 58.5 63.9 - 53.9 %   MCV 92.8 80.0 - 100.0 fL   MCH 31.8 26.0 - 34.0 pg   MCHC 34.3 30.0 - 36.0 g/dL   RDW 86.8 88.4 - 84.4 %   Platelet Count 163 150 - 400 K/uL   nRBC 0.0 0.0 - 0.2 %   Neutrophils Relative % 62 %   Neutro Abs 3.5 1.7 - 7.7 K/uL   Lymphocytes Relative 29 %   Lymphs Abs 1.7 0.7 - 4.0 K/uL   Monocytes Relative 8 %   Monocytes Absolute 0.5 0.1 - 1.0 K/uL   Eosinophils Relative 1 %   Eosinophils Absolute 0.1 0.0 - 0.5 K/uL   Basophils Relative 0 %   Basophils Absolute 0.0 0.0 - 0.1 K/uL  Immature Granulocytes 0 %   Abs Immature Granulocytes 0.01 0.00 - 0.07 K/uL      Assessment & Plan:   Problem List Items Addressed This Visit   None Visit Diagnoses       Tinnitus of both ears    -  Primary   Suspect it is related to infusion therapy. Continue with antihistamine.     Angular cheilitis       B12 checked at visit today.   Relevant Orders   B12     Elevated glucose        Observed on lab review.  Lengthy conversation had with patient regarding carbohydrate intake, soda intake and decreasing consumption of these.   Relevant Orders   Comp Met (CMET)   HgB A1c     Urinary incontinence, unspecified type       Relevant Orders   Ambulatory referral to Urogynecology     Finger pain, right       Referral placed for Ortho.   Relevant Orders   Ambulatory referral to Orthopedic Surgery     Encounter for screening mammogram for malignant neoplasm of breast            Follow up plan: Return in about 1 month (around 08/28/2024) for Physical and Fasting labs.      "

## 2024-08-01 ENCOUNTER — Ambulatory Visit: Payer: Self-pay | Admitting: Nurse Practitioner

## 2024-08-01 ENCOUNTER — Other Ambulatory Visit: Payer: Self-pay

## 2024-08-01 ENCOUNTER — Encounter: Payer: Self-pay | Admitting: Oncology

## 2024-08-01 ENCOUNTER — Telehealth: Payer: Self-pay | Admitting: Nurse Practitioner

## 2024-08-01 LAB — COMPREHENSIVE METABOLIC PANEL WITH GFR
ALT: 23 [IU]/L (ref 0–32)
AST: 28 [IU]/L (ref 0–40)
Albumin: 4.7 g/dL (ref 3.9–4.9)
Alkaline Phosphatase: 146 [IU]/L — ABNORMAL HIGH (ref 49–135)
BUN/Creatinine Ratio: 12 (ref 12–28)
BUN: 10 mg/dL (ref 8–27)
Bilirubin Total: 0.4 mg/dL (ref 0.0–1.2)
CO2: 21 mmol/L (ref 20–29)
Calcium: 9.9 mg/dL (ref 8.7–10.3)
Chloride: 101 mmol/L (ref 96–106)
Creatinine, Ser: 0.86 mg/dL (ref 0.57–1.00)
Globulin, Total: 2.5 g/dL (ref 1.5–4.5)
Glucose: 142 mg/dL — ABNORMAL HIGH (ref 70–99)
Potassium: 4.2 mmol/L (ref 3.5–5.2)
Sodium: 139 mmol/L (ref 134–144)
Total Protein: 7.2 g/dL (ref 6.0–8.5)
eGFR: 75 mL/min/{1.73_m2}

## 2024-08-01 LAB — VITAMIN B12: Vitamin B-12: 545 pg/mL (ref 232–1245)

## 2024-08-01 LAB — HEMOGLOBIN A1C
Est. average glucose Bld gHb Est-mCnc: 111 mg/dL
Hgb A1c MFr Bld: 5.5 % (ref 4.8–5.6)

## 2024-08-01 MED ORDER — MONTELUKAST SODIUM 10 MG PO TABS: 10.0000 mg | ORAL_TABLET | Freq: Every day | ORAL | 1 refills | Status: AC

## 2024-08-01 MED ORDER — METHYLPREDNISOLONE 4 MG PO TBPK: ORAL_TABLET | ORAL | 0 refills | Status: AC

## 2024-08-01 MED ORDER — VARENICLINE TARTRATE (STARTER) 0.5 MG X 11 & 1 MG X 42 PO TBPK: ORAL_TABLET | ORAL | 0 refills | Status: AC

## 2024-08-01 MED ORDER — CETIRIZINE HCL 10 MG PO TABS: 10.0000 mg | ORAL_TABLET | Freq: Every day | ORAL | 1 refills | Status: AC

## 2024-08-01 NOTE — Telephone Encounter (Signed)
 Patient has been notified about allergy medication refills and the medrol  dose pack.

## 2024-08-01 NOTE — Telephone Encounter (Signed)
 I want her to cut back on her carbohydrate intake.  I also called her in a medrol  dose pak to see if the ringing will resolve.  I refilled her allergy medications.

## 2024-08-01 NOTE — Telephone Encounter (Signed)
 Chantix  sent to the pharmacy.  I'm not sure what bath allergy pill she is referencing.

## 2024-08-01 NOTE — Telephone Encounter (Signed)
 Patient's daughter Patt) brought in a note from [facility] requesting prescriptions for bath allergy pills and chantix  be sent to [facility]. Patient requesting call back at [phone]. Placing  note in providers folder for review

## 2024-08-07 ENCOUNTER — Inpatient Hospital Stay: Admitting: Oncology

## 2024-08-07 ENCOUNTER — Inpatient Hospital Stay

## 2024-08-08 ENCOUNTER — Ambulatory Visit

## 2024-08-09 ENCOUNTER — Inpatient Hospital Stay: Admitting: Nurse Practitioner

## 2024-08-09 ENCOUNTER — Inpatient Hospital Stay

## 2024-08-14 ENCOUNTER — Ambulatory Visit: Admitting: Nurse Practitioner

## 2024-08-29 ENCOUNTER — Encounter: Admitting: Nurse Practitioner
